# Patient Record
Sex: Male | Born: 1940 | Race: White | Hispanic: No | Marital: Married | Smoking: Former smoker
Health system: Southern US, Community
[De-identification: ages and names within clinical notes are randomized; demographics above are authoritative.]

## PROBLEM LIST (undated history)

## (undated) DIAGNOSIS — M1990 Unspecified osteoarthritis, unspecified site: Secondary | ICD-10-CM

## (undated) DIAGNOSIS — I4891 Unspecified atrial fibrillation: Secondary | ICD-10-CM

## (undated) DIAGNOSIS — K449 Diaphragmatic hernia without obstruction or gangrene: Secondary | ICD-10-CM

## (undated) DIAGNOSIS — G4733 Obstructive sleep apnea (adult) (pediatric): Secondary | ICD-10-CM

## (undated) DIAGNOSIS — I209 Angina pectoris, unspecified: Secondary | ICD-10-CM

## (undated) DIAGNOSIS — G4730 Sleep apnea, unspecified: Secondary | ICD-10-CM

## (undated) DIAGNOSIS — N189 Chronic kidney disease, unspecified: Secondary | ICD-10-CM

## (undated) DIAGNOSIS — K5792 Diverticulitis of intestine, part unspecified, without perforation or abscess without bleeding: Secondary | ICD-10-CM

## (undated) DIAGNOSIS — C61 Malignant neoplasm of prostate: Secondary | ICD-10-CM

## (undated) DIAGNOSIS — I10 Essential (primary) hypertension: Secondary | ICD-10-CM

## (undated) DIAGNOSIS — J189 Pneumonia, unspecified organism: Secondary | ICD-10-CM

## (undated) DIAGNOSIS — K219 Gastro-esophageal reflux disease without esophagitis: Secondary | ICD-10-CM

## (undated) DIAGNOSIS — C801 Malignant (primary) neoplasm, unspecified: Secondary | ICD-10-CM

## (undated) DIAGNOSIS — K759 Inflammatory liver disease, unspecified: Secondary | ICD-10-CM

## (undated) DIAGNOSIS — I499 Cardiac arrhythmia, unspecified: Secondary | ICD-10-CM

## (undated) DIAGNOSIS — C439 Malignant melanoma of skin, unspecified: Secondary | ICD-10-CM

## (undated) DIAGNOSIS — K635 Polyp of colon: Secondary | ICD-10-CM

## (undated) DIAGNOSIS — I509 Heart failure, unspecified: Secondary | ICD-10-CM

## (undated) HISTORY — DX: Obstructive sleep apnea (adult) (pediatric): G47.33

## (undated) HISTORY — DX: Polyp of colon: K63.5

## (undated) HISTORY — PX: JOINT REPLACEMENT: SHX530

## (undated) HISTORY — PX: CARDIAC CATHETERIZATION: SHX172

## (undated) HISTORY — PX: LUNG CANCER SURGERY: SHX702

## (undated) HISTORY — DX: Malignant melanoma of skin, unspecified: C43.9

## (undated) HISTORY — DX: Malignant neoplasm of prostate: C61

## (undated) HISTORY — DX: Gastro-esophageal reflux disease without esophagitis: K21.9

## (undated) HISTORY — PX: PROSTATECTOMY: SHX69

## (undated) HISTORY — DX: Diaphragmatic hernia without obstruction or gangrene: K44.9

## (undated) HISTORY — PX: FRACTURE SURGERY: SHX138

## (undated) HISTORY — PX: TONSILLECTOMY: SUR1361

## (undated) HISTORY — DX: Diverticulitis of intestine, part unspecified, without perforation or abscess without bleeding: K57.92

---

## 1993-03-10 DIAGNOSIS — C61 Malignant neoplasm of prostate: Secondary | ICD-10-CM

## 1993-03-10 HISTORY — DX: Malignant neoplasm of prostate: C61

## 1997-08-04 ENCOUNTER — Ambulatory Visit (HOSPITAL_COMMUNITY): Admission: RE | Admit: 1997-08-04 | Discharge: 1997-08-04 | Payer: Self-pay | Admitting: Internal Medicine

## 1999-08-19 ENCOUNTER — Ambulatory Visit (HOSPITAL_COMMUNITY): Admission: RE | Admit: 1999-08-19 | Discharge: 1999-08-19 | Payer: Self-pay | Admitting: Gastroenterology

## 2000-04-30 ENCOUNTER — Encounter: Payer: Self-pay | Admitting: Family Medicine

## 2000-04-30 ENCOUNTER — Encounter: Admission: RE | Admit: 2000-04-30 | Discharge: 2000-04-30 | Payer: Self-pay | Admitting: Family Medicine

## 2002-10-05 ENCOUNTER — Encounter: Admission: RE | Admit: 2002-10-05 | Discharge: 2002-10-05 | Payer: Self-pay | Admitting: Family Medicine

## 2002-10-05 ENCOUNTER — Encounter: Payer: Self-pay | Admitting: Family Medicine

## 2002-10-07 ENCOUNTER — Encounter: Payer: Self-pay | Admitting: Family Medicine

## 2002-10-07 ENCOUNTER — Encounter: Admission: RE | Admit: 2002-10-07 | Discharge: 2002-10-07 | Payer: Self-pay | Admitting: Family Medicine

## 2004-08-01 ENCOUNTER — Ambulatory Visit (HOSPITAL_COMMUNITY): Admission: RE | Admit: 2004-08-01 | Discharge: 2004-08-01 | Payer: Self-pay | Admitting: Gastroenterology

## 2004-08-01 ENCOUNTER — Encounter (INDEPENDENT_AMBULATORY_CARE_PROVIDER_SITE_OTHER): Payer: Self-pay | Admitting: Specialist

## 2005-01-29 ENCOUNTER — Encounter: Admission: RE | Admit: 2005-01-29 | Discharge: 2005-01-29 | Payer: Self-pay | Admitting: Orthopaedic Surgery

## 2005-02-14 ENCOUNTER — Ambulatory Visit (HOSPITAL_BASED_OUTPATIENT_CLINIC_OR_DEPARTMENT_OTHER): Admission: RE | Admit: 2005-02-14 | Discharge: 2005-02-14 | Payer: Self-pay | Admitting: Orthopaedic Surgery

## 2005-02-14 ENCOUNTER — Ambulatory Visit (HOSPITAL_COMMUNITY): Admission: RE | Admit: 2005-02-14 | Discharge: 2005-02-14 | Payer: Self-pay | Admitting: Orthopaedic Surgery

## 2005-02-21 ENCOUNTER — Ambulatory Visit (HOSPITAL_COMMUNITY): Admission: RE | Admit: 2005-02-21 | Discharge: 2005-02-21 | Payer: Self-pay | Admitting: Orthopaedic Surgery

## 2006-04-30 ENCOUNTER — Ambulatory Visit: Payer: Self-pay | Admitting: Pulmonary Disease

## 2006-09-23 ENCOUNTER — Encounter (HOSPITAL_COMMUNITY): Admission: RE | Admit: 2006-09-23 | Discharge: 2006-09-28 | Payer: Self-pay

## 2006-10-20 ENCOUNTER — Ambulatory Visit (HOSPITAL_BASED_OUTPATIENT_CLINIC_OR_DEPARTMENT_OTHER): Admission: RE | Admit: 2006-10-20 | Discharge: 2006-10-20 | Payer: Self-pay | Admitting: Urology

## 2006-10-26 ENCOUNTER — Encounter: Admission: RE | Admit: 2006-10-26 | Discharge: 2006-10-26 | Payer: Self-pay | Admitting: Family Medicine

## 2006-12-15 ENCOUNTER — Inpatient Hospital Stay (HOSPITAL_COMMUNITY): Admission: RE | Admit: 2006-12-15 | Discharge: 2006-12-19 | Payer: Self-pay | Admitting: Orthopaedic Surgery

## 2007-01-05 ENCOUNTER — Encounter: Admission: RE | Admit: 2007-01-05 | Discharge: 2007-01-05 | Payer: Self-pay | Admitting: Orthopaedic Surgery

## 2008-09-29 ENCOUNTER — Inpatient Hospital Stay (HOSPITAL_COMMUNITY): Admission: EM | Admit: 2008-09-29 | Discharge: 2008-10-02 | Payer: Self-pay | Admitting: Emergency Medicine

## 2008-10-30 ENCOUNTER — Encounter: Admission: RE | Admit: 2008-10-30 | Discharge: 2008-10-30 | Payer: Self-pay | Admitting: Cardiology

## 2010-01-05 ENCOUNTER — Encounter: Admission: RE | Admit: 2010-01-05 | Discharge: 2010-01-05 | Payer: Self-pay | Admitting: Sports Medicine

## 2010-03-31 ENCOUNTER — Encounter: Payer: Self-pay | Admitting: Urology

## 2010-05-01 ENCOUNTER — Other Ambulatory Visit: Payer: Self-pay | Admitting: Dermatology

## 2010-06-16 LAB — TSH: TSH: 1.156 u[IU]/mL (ref 0.350–4.500)

## 2010-06-16 LAB — CBC
Hemoglobin: 13.6 g/dL (ref 13.0–17.0)
MCHC: 34.1 g/dL (ref 30.0–36.0)
Platelets: 166 10*3/uL (ref 150–400)
RBC: 4.37 MIL/uL (ref 4.22–5.81)
RBC: 4.78 MIL/uL (ref 4.22–5.81)
WBC: 8.1 10*3/uL (ref 4.0–10.5)

## 2010-06-16 LAB — POCT I-STAT, CHEM 8
BUN: 14 mg/dL (ref 6–23)
Calcium, Ion: 1.16 mmol/L (ref 1.12–1.32)
Chloride: 105 mEq/L (ref 96–112)
Creatinine, Ser: 1.1 mg/dL (ref 0.4–1.5)
Sodium: 141 mEq/L (ref 135–145)

## 2010-06-16 LAB — COMPREHENSIVE METABOLIC PANEL
AST: 21 U/L (ref 0–37)
Albumin: 4 g/dL (ref 3.5–5.2)
Calcium: 9.1 mg/dL (ref 8.4–10.5)
Creatinine, Ser: 1.06 mg/dL (ref 0.4–1.5)
GFR calc Af Amer: >60 mL/min (ref >60)
Total Protein: 7 g/dL (ref 6.0–8.3)

## 2010-06-16 LAB — POCT CARDIAC MARKERS
CKMB, poc: <1 ng/mL — ABNORMAL LOW (ref 1.0–8.0)
Myoglobin, poc: 134 ng/mL (ref 12–200)
Myoglobin, poc: 149 ng/mL (ref 12–200)
Troponin i, poc: <0.05 ng/mL (ref 0.00–0.09)

## 2010-06-16 LAB — D-DIMER, QUANTITATIVE: D-Dimer, Quant: 0.36 ug/mL-FEU (ref 0.00–0.48)

## 2010-06-16 LAB — CARDIAC PANEL(CRET KIN+CKTOT+MB+TROPI)
Relative Index: INVALID (ref 0.0–2.5)
Total CK: 57 U/L (ref 7–232)
Total CK: 60 U/L (ref 7–232)
Troponin I: 0.01 ng/mL (ref 0.00–0.06)

## 2010-06-16 LAB — APTT: aPTT: 30 seconds (ref 24–37)

## 2010-06-16 LAB — CK TOTAL AND CKMB (NOT AT ARMC)
CK, MB: 1.3 ng/mL (ref 0.3–4.0)
CK, MB: 1.5 ng/mL (ref 0.3–4.0)
Relative Index: INVALID (ref 0.0–2.5)

## 2010-06-16 LAB — TROPONIN I
Troponin I: 0.02 ng/mL (ref 0.00–0.06)
Troponin I: <0.01 ng/mL (ref 0.00–0.06)

## 2010-07-23 NOTE — Op Note (Signed)
[name] [name], [name] [name]             ACCOUNT NO.:  [id number]   MEDICAL RECORD NO.:  [id number]          PATIENT TYPE:  AMB   LOCATION:  [facility]                         FACILITY:  [facility]   PHYSICIAN:  Jamison Neighbor, M.D.  DATE OF BIRTH:  01/06/1941   DATE OF PROCEDURE:  10/20/2006  DATE OF DISCHARGE:                               OPERATIVE REPORT   SERVICE:  Urology.   PREOPERATIVE DIAGNOSIS:  Phimosis.   POSTOPERATIVE DIAGNOSIS:  Phimosis.   PROCEDURE:  Circumcision.   SURGEON:  Dr. Marcelyn Bruins.   ANESTHESIA:  General.   COMPLICATIONS:  None.   DRAINS:  None.   BRIEF HISTORY:  This 70 year old male is status post radical  prostatectomy several years ago.  The patient's PSA has recently  increased slightly to 0.24. He has had a Prostascint scan  ordered in  order to evaluate that problem.  The patient has developed some problems  with phimosis and has requested a circumcision.  He understands the  risks and benefits of the procedure and gave full informed consent.   PROCEDURE:  After successful induction of general anesthesia, the  patient was placed in the dorsal lithotomy position, prepped with  Betadine and draped in the usual sterile fashion.  A circumferential  nerve block was placed around the base of the penis.  A circumferential  incision was marked at approximately 8 mm below the glans penis and also  on the outer penile skin at the proximal level of glans. These two  circumferential incisions were made. The resultant skin bridge was  undermined and then clamped with a straight clamp, electrocautery was  used to excise this area and to remove the entire specimen.  Hemostasis  was obtained with electrocautery.  Quadrant sutures of  4-0 chromic were  placed in all four with a box-type suture placed at the frenulum.  Additional interrupted sutures were used to complete the anastomosis.  Final inspection showed a normal glans penis.  The patient had adequate  residual  skin for coverage with erection but all the redundant tissue  had been successfully removed. The specimen was  unremarkable and will be discarded.  There was no reason for pathologic  analysis.  The patient had a dressing of Xeroform gauze, dry gauze and  Coban which he will remove in 1 day.  He will be sent home with a  prescription of Lorcet for pain management and return to the office in 2  weeks for follow-up.      Jamison Neighbor, M.D.  Electronically Signed     RJE/MEDQ  D:  10/20/2006  T:  10/21/2006  Job:  [id number]

## 2010-07-23 NOTE — Cardiovascular Report (Signed)
[name] [name], [name] [name]             ACCOUNT NO.:  [id number]   MEDICAL RECORD NO.:  [id number]          PATIENT TYPE:  INP   LOCATION:  4731                         FACILITY:  [facility]   PHYSICIAN:  Armanda Magic, M.D.     DATE OF BIRTH:  1940/05/20   DATE OF PROCEDURE:  10/02/2008  DATE OF DISCHARGE:  10/02/2008                            CARDIAC CATHETERIZATION   REFERRING PHYSICIAN:  Bryan Lemma. Ehinger, MD   PROCEDURES:  1. Left heart catheterization.  2. Coronary angiography.  3. Left ventriculography.   OPERATOR:  Armanda Magic, MD   INDICATIONS:  Chest pain.   COMPLICATIONS:  None.   IV ACCESS:  Via right femoral artery, 5-French sheath.   IV MEDICATIONS:  1. Versed 1 mg.  2. Fentanyl 25 mcg.   This is a 70 year old male who has a history of hypertension and GERD as  well as asthma who presented with episodes of chest pain while asleep.  He ruled out for myocardial infarction and now presents for cardiac  catheterization.   The patient was brought to the cardiac catheterization laboratory in the  fasting nonsedated state.  Informed consent was obtained.  The patient  was connected to continuous heart rate, pulse oximetry monitoring, and  intermittent blood pressure monitoring.  The right groin was prepped and  draped in a sterile fashion.  Xylocaine 1% was used for local  anesthesia.  Using the modified Seldinger technique, a 5-French sheath  was placed in the right femoral artery.  Under fluoroscopic guidance, a  5-French JL4 catheter was placed in the left coronary artery.  Multiple  cine films were taken at 30-degree RAO and 40-degree LAO views.  This  catheter was then exchanged out over a guidewire for a 5-French JR4  catheter, which successfully engaged the right coronary ostium.  Multiple cine films were taken at 30-degree RAO and 40-degree LAO views.  This catheter was then exchanged out over a guidewire for 5-French  angled pigtail catheter, which was placed  across the aortic valve.  Left  ventriculography was performed in a 30-degree RAO view using total of 30  mL of contrast at 15 mL per second.  The catheter was then pulled back  across the aortic valve with no significant gradient noted.  At the end  of procedure, all catheters and sheaths were removed.  Manual  compression was performed until adequate hemostasis was obtained.  The  patient was transferred back to room in stable condition.   RESULTS:  The left main coronary artery is widely patent and bifurcates  into left anterior descending artery and left circumflex artery.  The  left anterior descending artery is widely patent throughout its course  of the apex.  It gives rise to a moderate-sized diagonal-1 branch, which  is widely patent.  A second diagonal-2 branch again moderate in size  which is widely patent and a third diagonal-3 branch, which is large and  widely patent and bifurcates into two daughter branches, both of which  are widely patent.  The ongoing LAD traverses to the apex and is patent.   The left circumflex is  widely patent throughout its course in the AV  groove giving rise to a first obtuse marginal branch and then terminates  in a second obtuse marginal branch, both of which are widely patent.   Right coronary artery is widely patent throughout its course and  distally bifurcates into posterior descending artery and posterior  lateral artery, both of which are widely patent.   Left ventriculography shows normal LV function, EF of 60%.  LV pressure  127/1 mmHg, aortic pressure 135/66 mmHg.   ASSESSMENT:  1. Normal coronary arteries.  2. Normal left ventricular function.  3. Noncardiac chest pain.   PLAN:  Discharge home after IV fluid and bedrest are complete.  He will  have a groin check in 2 weeks with my nurse practitioner.  He will  follow up with his primary physician for further workup of noncardiac  chest pain.  We will check a D-dimer before he  leaves today.      Armanda Magic, M.D.  Electronically Signed     TT/MEDQ  D:  10/02/2008  T:  10/03/2008  Job:  [id number]   cc:   Bryan Lemma. Manus Gunning, M.D.

## 2010-07-23 NOTE — Discharge Summary (Signed)
[name], [name]             ACCOUNT NO.:  [id number]   MEDICAL RECORD NO.:  [id number]          PATIENT TYPE:  INP   LOCATION:  4731                         FACILITY:  [facility]   PHYSICIAN:  Armanda Magic, M.D.     DATE OF BIRTH:  08-16-1940   DATE OF ADMISSION:  09/29/2008  DATE OF DISCHARGE:  10/02/2008                               DISCHARGE SUMMARY   ADMISSION DIAGNOSES:  1. Chest pain.  2. Hypertension.  3. Gastroesophageal reflux disease.  4. Prostate carcinoma.  5. Melanoma.  6. Diverticulosis.  7. Asthma.   DISCHARGE DIAGNOSES:  1. Noncardiac chest pain with normal coronary arteries by cardiac      catheterization.  2. Hypertension.  3. Gastroesophageal reflux disease.  4. Prostate carcinoma.  5. Melanoma.  6. Diverticulosis.  7. Asthma.   PROCEDURES:  On October 02, 2008, the patient was taken to the cardiac  catheterization laboratory and underwent cardiac catheterization  revealing normal coronary arteries and normal LV function.   COMPLICATIONS:  None.   CONSULTANTS:  None.   HISTORY OF PRESENT ILLNESS:  This is a 70 year old male with no prior  cardiac history who was admitted with chest pain developing 3 days prior  to admission while asleep, described as a substernal chest pain  radiating to left shoulder and down the left arm associated with nausea,  diaphoresis, and shortness of breath.  It lasted 6-7 hours before  resolving spontaneously.  Since then, he has had several additional  spells of spontaneous nonexertional, but not a severe chest pain.  He  presented to Dr. Randel Books office, initially where cardiac enzymes were  negative and then was admitted through [facility] on September 29, 2008.   HOSPITAL COURSE:  Mr. Seda ruled out for myocardial infarction by  serial cardiac enzymes.  He was placed on Lovenox during his hospital  stay and on October 02, 2008, he underwent cardiac catheterization which  revealed normal coronary arteries,  normal LV function with no gradient  across aortic valve.  It was felt that his pain was noncardiac in  etiology.  A D-dimer was ordered prior to his discharge and was pending  at the time of this dictation.   DISCHARGE DIET:  He is to resume a prudent diet.   MEDICATIONS ON DISCHARGE:  1. Omeprazole 20 mg daily.  2. Azor 5/20 mg daily.  3. Singulair 10 mg daily.  4. He is also to continue calcium, vitamin D, fish oil, and      multivitamin.   ACTIVITY:  Once he has completed his bedrest and discharged, he is to be  out of bed and walking with activity increasing slowly.  He has been  advised not to lift for 1 week anything greater than 10 pounds and no  driving for 24 hours.   FOLLOWUP:  He is to follow up in Dr. Norris Cross office with nurse  practitioner Evern Bio for groin check on October 23, 2008, at 10:10  a.m.   LABORATORY DATA DURING HOSPITAL STAY:  Cardiac enzymes were negative x3.  CBC, white blood cell count 6.7, hemoglobin 13.6,  hematocrit 39.7,  platelet count 162.  TSH 1.56, INR 1.1.  Sodium 141, potassium 4,  chloride 107, bicarb 27, BUN 12, creatinine 1.06, glucose 100, alkaline  phosphatase 59, AST 21, ALT 16, total protein 7, albumin 4, and calcium  9.1.  Chest x-ray showed no active disease.      Armanda Magic, M.D.  Electronically Signed     TT/MEDQ  D:  10/02/2008  T:  10/02/2008  Job:  [id number]   cc:   Bryan Lemma. Manus Gunning, M.D.

## 2010-07-23 NOTE — Op Note (Signed)
[name] [name], [name] [name]             ACCOUNT NO.:  [id number]   MEDICAL RECORD NO.:  [id number]          PATIENT TYPE:  INP   LOCATION:  2550                         FACILITY:  [facility]   PHYSICIAN:  Claude Manges. Whitfield, M.D.DATE OF BIRTH:  November 02, 1940   DATE OF PROCEDURE:  12/15/2006  DATE OF DISCHARGE:                               OPERATIVE REPORT   PREOPERATIVE DIAGNOSIS:  End-stage osteoarthritis, left knee.   POSTOPERATIVE DIAGNOSIS:  End-stage osteoarthritis, left knee.   PROCEDURE:  Left total knee arthroplasties.   SURGEON:  Claude Manges. Cleophas Dunker, M.D.   ASSISTANT:  Rexene Edison, [facility]   ANESTHESIA:  General orotracheal with supplemental femoral nerve block.   COMPLICATIONS:  None.   COMPONENTS:  DePuy LCS large femoral component #5 rotating keeled tibial  tray with a 10 mm bridging bearing, a metal backed three pegged rotating  patella.  All was secured polymethyl methacrylate.   PROCEDURE:  The patient comfortable on the operating table and under  general orotracheal anesthesia with a supplemental femoral nerve block,  nursing staff inserted a Foley catheter.  The left lower extremity was  then placed in a thigh tourniquet.  The left lower extremity was then  prepped with Betadine scrub and DuraPrep from the tourniquet to the  midfoot.  Sterile draping was performed.   With the extremity still elevated it was Esmarch exsanguinated with the  proximal tourniquet at 350 mmHg.   A midline longitudinal incision was made centered over the patella  extending from the superior pouch to the tibial tubercle.  Via sharp  dissection incision was carried down to subcutaneous tissue.  First  layer of capsule was incised.  In the midline there was a prepatellar  bursa which was resected.  A medial parapatellar incision was made  through the deep capsule with the Bovie.  There was a clear yellow joint  effusion.   Patella was everted 180 degrees, knee flexed to 90 degrees.  There were  large osteophytes along the medial and lateral femoral condyle and a  moderate amount of beefy red synovitis.  Synovectomy was performed.  Osteophytes were removed.  I templated a large femoral component  preoperatively.  This was confirmed intraoperatively.   First cut was made transversely in the proximal tibia using the external  guide.  Subsequent cuts were then made on the femur using the femoral  guides and a 4 degree distal femoral valgus cut.  The ACL and PCL were  sacrificed as were medial and lateral menisci.  Lamina spreader was then  inserted into each compartment to resect the medial lateral menisci and  any remnants of ACL and PCL.  Osteophytes removed from the posterior  femoral condyle with a curved three-quarter inch osteotome.  MCL and LCL  remained intact.  Finishing cut was then made on the femur for the  oblique cuts posteriorly and anteriorly.   Retractor was then placed about the tibia.  We measured a #5 rotating  keeled component.  Center cut was made followed by the keeled cut.  The  trial rotating tibial tray with was then inserted followed by the 10  mm  bridging bearing.  Flexion/extension gaps at 10 mm were symmetrical  throughout the procedure.  The femoral component was then applied  through a full range of motion.  There was no opening with either varus  or valgus stress, negative anterior drawer sign and no malrotation of  the tibial tray.   The patella was then prepared by removing 12 mm of bone thickness  leaving 13 mm of patella.  The patella jig was applied to make the three  holes.  The trial patella was applied and again through full range of  motion with all components in place, there was no subluxation of  patella.   Trial components removed joint was copiously irrigated with jet saline.  Each of the final components were then applied with polymethyl  methacrylate.  Initial component inserted was the tibial tray.  Bone was  impacted into the  center hole and the tibial tray was then impacted.  Extraneous methacrylate was removed from around the periphery.  The 10  mm bridging bearing was inserted.  The femoral component was then  impacted with polymethyl methacrylate the knee in extension.  Extraneous  methacrylate was removed from about the femur.  The patella was applied  with methacrylate and the patellar clamp.  After complete maturation,  the joint was explored, any extraneous methacrylate was removed with a  osteotome.  Bleeding bone was hemostasis with bone wax.  Tourniquet was  deflated.  There is immediate capillary refill and bleeding to the  operative site.  We had a nice dry feel after Bovie coagulation.  It did  not feel like a Hemovac was necessary.   The joint was again irrigated with saline solution.  The capsule was  closed with interrupted #1 Ethibond.  Superficial capsule closed with 0  Ethibond.  Superficial capsule with 2-0 Vicryl, the superficial tissue  with 2-0 Vicryl, skin closed with skin clips.  Sterile bulky dressing  was applied.   The patient tolerated procedure without complications.      Claude Manges. Cleophas Dunker, M.D.  Electronically Signed     PWW/MEDQ  D:  12/15/2006  T:  12/15/2006  Job:  [id number]

## 2010-07-23 NOTE — H&P (Signed)
[name] [name], [name] [name]             ACCOUNT NO.:  [id number]   MEDICAL RECORD NO.:  [id number]          PATIENT TYPE:  INP   LOCATION:  4731                         FACILITY:  [facility]   PHYSICIAN:  Francisca December, M.D.  DATE OF BIRTH:  1940/07/11   DATE OF ADMISSION:  09/29/2008  DATE OF DISCHARGE:                              HISTORY & PHYSICAL   REASON FOR ADMISSION:  Chest pain.   HISTORY OF PRESENT ILLNESS:  Mr. Donatello Kleve is a pleasant 70-year-  old male without a prior cardiac history who developed 3 days prior to  admission the onset while asleep of anterior substernal chest pain that  radiated into the left shoulder and down the left arm.  It was  associated with nausea, diaphoresis, and some shortness of breath.  The  pain persisted for approximately 6-7 hours before resolving  spontaneously.  Since that time, he has had several additional spells  all of which were spontaneous and nonexertional but not as severe as the  initial episode.  He sought care at Dr. Randel Books office today who send  him to [facility] Emergency Room.  Here, he has been shown as in Dr. Randel Books  office to have a normal EKG and initial CK-MB, troponin by point-of-care  enzymes have been normal.  He is currently being treated with topical  nitroglycerin and IV heparin and is pain free.   PAST MEDICAL HISTORY:  1. Hypertension.  2. GERD.  3. Prostate carcinoma.  4. Melanoma.  5. Diverticulosis.  6. Asthma.   MEDICATIONS AS AN OUTPATIENT:  1. Omeprazole 20 mg p.o. daily.  2. Azor 5/20 one p.o. daily.  3. Singulair 10 mg once a day.  4. He also takes calcium, vitamin D, fish oil, and multivitamins.   DRUG ALLERGIES:  AMINOPHYLLINE, LISINOPRIL which causes a cough, and  TENORMIN which apparently just did not work for his blood pressure.   FAMILY HISTORY:  Mother had breast carcinoma, otherwise, unremarkable.  No significant history of early coronary artery disease.   SOCIAL HISTORY:  He quit  smoking in 1985.  No alcohol.  No recreational  drug use.  He is a retired Architect.  He is married,  accompanied by his wife in the emergency room today for 53 years.  She  has 3 adult children and 6 grandchildren.   REVIEW OF SYSTEMS:  He denies any cough or hemoptysis.  He does have  some headache now associated with his nitroglycerin.  No visual changes.  No difficulty swallowing.  Does not wheeze.  He has not had any tachy  palpitation, lightheadedness, syncope, or near syncope.  He has no  abdominal pain, diarrhea.  He is somewhat chronically constipated.  No  hematochezia or melena.  He has difficulty holding his urine and is up  frequently at night to urinate.  He does not have any dysuria.  He has  had left knee replacement and osteoarthritic changes in both knees.  He  also has a mechanical low back pain of arthritic nature.  Denies any  numbness or tingling.  No muscle weakness.  No difficulty to  speech.   PHYSICAL EXAMINATION:  VITAL SIGNS:  The blood pressure is 119/55, pulse  is 61 and regular, respiratory rate 20, temperature 98.1.  GENERAL:  This is a well-appearing, mildly obese 70 year old man in no  distress.  HEENT:  Unremarkable.  Head is atraumatic and normocephalic.  The pupils  are equal, reactive to light.  Extraocular movements are intact.  Sclerae are anicteric.  Oral mucosa is pink and moist.  Teeth and gums  are in good repair.  Tongue is not coated.  NECK:  Supple without thyromegaly or masses.  The carotid upstrokes are  normal.  There is no bruit.  There is no JVD.  CHEST:  Clear with adequate excursion.  No wheezes, rales, or rhonchi.  HEART:  Regular rhythm.  Normal S1 and S2 is heard.  No S3, S4, murmur,  click, or rub noted.  ABDOMEN:  Soft, flat, nontender.  No midline pulsatile mass.  Bowel  sounds present in all quadrants.  EXTERNAL GENITALIA:  Normal with normal male phallus, descended  testicles.  No lesions.  RECTAL:  Not  performed.  EXTREMITIES:  Full range of motion.  No edema.  Intact distal pulses.  There is a TKR scar on the left.  NEUROLOGICAL:  Cranial nerves II through XII are intact.  Motor and  sensory are grossly intact.  Gait not tested.  SKIN:  Warm, dry, and clear.   ACCESSORY CLINICAL DATA:  As mentioned, initial CK-MB, troponin by point-  of-care enzymes negative x2.  Admission hemogram is normal, serum  electrolytes are normal.  Creatinine 1.06, BUN 12.  PT/PTT are normal.   Electrocardiogram, normal sinus rhythm.  Normal EKG.   Chest x-ray, no active cardiopulmonary disease.   ASSESSMENT:  1. Unstable angina pectoris.  2. Essential hypertension.  3. Hyperlipidemia.  4. History of malignant neoplasm of the prostate.  5. Esophageal reflux.  6. History of cough, on lisinopril.   PLAN:  1. I am admitting the patient for treatment with subcutaneous Lovenox      and IV nitroglycerin.  He has been administered aspirin four 81 mg      tablets at this time.  2. I have advised him of the serious nature of this illness and that      he is essentially demonstrating pre-infarction angina.  It is      important that we gain control over the situation and that he will      likely require cardiac catheterization.  Goals, risks, and      alternatives were discussed briefly  3. We will plan for his catheterization on October 02, 2008, by Dr. Armanda Magic but any recurrent symptoms not well controlled between now      and then will prompt early intervention.  Also, we will repeat his      CK-MB and troponin through the laboratory.      Francisca December, M.D.  Electronically Signed     JHE/MEDQ  D:  09/29/2008  T:  09/30/2008  Job:  [id number]   cc:   Bryan Lemma. Manus Gunning, M.D.

## 2010-07-26 NOTE — Op Note (Signed)
[place]  Patient:    [name] [name], [name] [name]                    MRN: [id number] Proc. Date: 08/19/99 Adm. Date:  [id number] Disc. Date: [id number] Attending:  Nelda Marseille CC:         Dyanne Carrel, M.D.                           Operative Report  PROCEDURE:  Colonoscopy.  INDICATIONS:  The patient with bright red blood per rectum, due for colonic screening.  Consent was signed after risks, benefits, methods, and options were  thoroughly discussed multiple times in the past.  MEDICATIONS:  Demerol 80 mg, Versed 8 mg.  DESCRIPTION OF PROCEDURE:  Rectal inspection was pertinent for external hemorrhoids.  Digital examination was negative.  Video colonoscope was inserted, easily advanced around the colon to the cecum.  The cecum was identified by the  appendiceal orifice and the ileocecal valve.  The prep was only fair and was worse on the right side than the left.  Lots of washing and suctioning was tried to be done on the right side and we did even roll him on his back just to change the dependent portions of the colon in an effort to better see the remaining parts f the colon that were covered with stool.  The scope was slowly withdrawn.  Lots f washing and suctioning was done as we slowly withdrew back to the rectum.  The rep on the left side was a little better than the right, but was only fair overall. We could not wash and suction all of the stool off the wall of the mucosa, but on low withdrawal through the colon, no polyps or masses were seen, nor any signs of bleeding.  There was some left-sided moderate amount of diverticuli, but no other abnormalities.  Once back in the rectum, the scope was retroflexed pertinent for some internal hemorrhoids.  The scope was straightened, air was withdrawn, and he scope removed.  The patient tolerated the procedure well.  There was no evidence of immediate  complications.  ENDOSCOPIC DIAGNOSES: 1. Internal and external hemorrhoids. 2. Left-sided diverticuli. 3. Otherwise within normal limits to the cecum with the prep a little better    on the left side than the right.  PLAN:  Yearly rectals and guaiacs per Dyanne Carrel, M.D.  GI follow-up p.r.n. or in six months.  In the future when colonic screening is needed, would use GoLytely prep or an extra bottle of Fleets or magnesium citrate.  Have him call me sooner p.r.n. DD:  08/19/99 TD:  08/21/99 Job: [id number]

## 2010-07-26 NOTE — Op Note (Signed)
NAME:  [name] [name], [name] [name]             ACCOUNT NO.:  [id number]   MEDICAL RECORD NO.:  [id number]          PATIENT TYPE:  AMB   LOCATION:  DSC                          FACILITY:  [facility]   PHYSICIAN:  Claude Manges. Whitfield, M.D.DATE OF BIRTH:  1940-09-01   DATE OF PROCEDURE:  02/14/2005  DATE OF DISCHARGE:                                 OPERATIVE REPORT   PREOPERATIVE DIAGNOSES:  1.  Degenerative joint disease, medial compartment, left knee.  2.  Probable tear of medial meniscus.   POSTOPERATIVE DIAGNOSES:  1.  Degenerative joint disease, medial compartment, left knee.  2.  Tear of medial meniscus.   PROCEDURES:  1.  Diagnostic arthroscopy, left knee.  2.  Arthroscopic partial medial meniscectomy.  3.  Microfracture of medial tibial plateau.   SURGEON:  Claude Manges. Cleophas Dunker, M.D.   ANESTHESIA:  IV sedation and local 1% Xylocaine with epinephrine.   COMPLICATIONS:  None.   HISTORY:  A 70 year old gentleman, has had trouble off and on with his left  knee for several years.  He has what appears to be at least some medial  compartment arthritis by plain x-ray, but he did have a recent MRI scan that  revealed medial compartment degenerative changes with possible tear of the  medial meniscus.  He continues to have problems to the point of compromise  despite time, injections and anti-inflammatory medicine and wishes to  proceed with an arthroscopic evaluation.   PROCEDURE:  With the patient comfortable on the operating table under IV  sedation, the left lower extremity was placed in a thigh holder.  The leg  was then prepped with DuraPrep from the thigh holder to the ankle.  Sterile  draping was performed.   Diagnostic arthroscopy was performed using a medial and lateral parapatellar  tendon stab wound.  Diagnostic arthroscopy was performed with the scope  placed in the lateral stab site.  There was a small clear yellow joint  effusion.  Diagnostic arthroscopy revealed mild  chondromalacia diffusely of  the patella.  There was probably diffuse thinning of the articular cartilage  but no deep furrowing.  There were no loose bodies.   The ACL appeared to be intact.   The lateral compartment appeared to be relatively clear of chondromalacia,  although there may have been some articular cartilage thinning.  The lateral  meniscus was intact except for some very superficial tearing of the leading  edge.   The medial compartment revealed increased varus.  There were areas of  exposed subchondral bone beneath the meniscus in the far medial portion, and  there was an old partial medial meniscectomy involving what appeared to be  the posterior third.  There was recurrent tearing and, accordingly, a  combination of basket forceps, the ArthroCare wand and the shaver were used  to further stabilize the meniscal tear.  I also performed a microfracture to  the areas of exposed bone along the medial portion of the tibial plateau and  then debrided this to be sure there were no loose pieces of bone.   The joint was then inspected with without evidence of loose  material, and  the two puncture sites were left open and infiltrated with 0.25% Marcaine  with epinephrine.  A sterile bulky dressing was applied, followed by an Ace  bandage.   Will plan Percocet for pain, office one week.      Claude Manges. Cleophas Dunker, M.D.  Electronically Signed     PWW/MEDQ  D:  02/14/2005  T:  02/14/2005  Job:  [id number]

## 2010-07-26 NOTE — Assessment & Plan Note (Signed)
[facility]                             PULMONARY OFFICE NOTE   NAME:[name] [name], [name] [name]                    MRN:          [id number]  DATE:04/30/2006                            DOB:          01/01/1941    REFERRING PHYSICIAN:  Dr. Jeanmarie Hubert   HISTORY OF PRESENT ILLNESS:  The patient is a 70 year old male, whom I  have been asked to see for persistent cough.  The patient states the  cough began whenever he was started on Quinopril for his hypertension.  This was quickly discontinued by Dr. Manus Gunning, and he feels that his  cough has improved, but it has not totally gone away.  The patient  states that the cough is dry in nature and occurs most often upon lying  down and before he goes to sleep.  The patient describes a tickling in  my throat, and does admit to frequent throat clearing.  He does have  some postnasal drip and feels that his throat is dry and scratchy in the  mornings whenever he awakens.  The patient does have a history of GERD  but denies any breakthrough symptoms unless he takes dairy products  before bedtime.  If he does this, he has severe GERD with regurgitation.  He also describes upper airway wheezing that sounds classic for upper  airway pseudowheezing.   PAST MEDICAL HISTORY:  1. Asthma.  2. History of prostate cancer with surgery years ago.  3. History of allergic rhinitis.   CURRENT MEDICATIONS:  1. Singulair 10 mg daily.  2. Azor 5/20, 1 daily.  3. Prilosec 20 mg daily.   THE PATIENT BELIEVES THAT HE HAS AN ALLERGY TO SOME TYPE OF ANTIBIOTIC  BUT DOES NOT KNOW THE NAME.   SOCIAL HISTORY:  He is married and has children.  He has retired from  [employer] work, has a history of smoking up to 4 packs-per-day for 30 years  but has not smoked since 1982.   FAMILY HISTORY:  Remarkable for mother dying with breast cancer,  otherwise is noncontributory.   REVIEW OF SYSTEMS:  As per history of present illness.  Also see patient  intake form documented in chart.   PHYSICAL EXAMINATION:  GENERAL:  He is an overweight male in no acute  distress.  VITAL SIGNS:  Blood pressure 116/70, pulse 65, temperature is 98.  Weight is 244 pounds.  Saturation on room air is 95%.  HEENT:  Pupils equal, round, and reactive to light and accommodation.  Extraocular muscles are intact.  Nares are patent without discharge.  Oropharynx is clear.  NECK:  Supple without JVD or lymphadenopathy.  There is no palpable  thyromegaly.  CHEST:  Totally clear to auscultation.  CARDIAC:  Regular rate and rhythm.  No murmurs, rubs, or gallops.  ABDOMEN:  Soft, nontender with good bowel sounds.  GENITAL/RECTAL/BREAST EXAM:  Not done and not indicated.  LOWER EXTREMITIES:  Trace to 1+ edema, right greater than left.  NEUROLOGIC:  He is alert and oriented with no obvious motor deficits.   LABORATORY DATA:  Spirometry was done today in the office  and is totally  within normal limits.   IMPRESSION:  Cough, probably secondary to allergic rhinitis with  postnasal drip, but there also may be a component of breakthrough  laryngopharyngeal reflux.  I doubt this is related to his asthma since  he has normal spirometry, nor do I think it is related to another lung  process.  I think we ought to give him a trial of more aggressive  treatment of laryngopharyngeal reflux and also something for allergic  rhinitis and see if it improves.  Also, would like to check a chest x-  ray to be thorough.  The patient is agreeable to this approach.   PLAN:  1. Check chest x-ray today.  2. Zyzal 1 p.o. daily as a trial.  3. Increase Prilosec to b.i.d. dosing.   The patient will follow up in 4 weeks to assess his problems.     Barbaraann Share, MD,FCCP  Electronically Signed    [facility]/MedQ  DD: 05/14/2006  DT: 05/14/2006  Job #: [id number]   cc:   Bryan Lemma. Manus Gunning, M.D.

## 2010-07-26 NOTE — Op Note (Signed)
NAMEKAMONTE, MCMICHEN             ACCOUNT NO.:  [id number]   MEDICAL RECORD NO.:  [id number]          PATIENT TYPE:  OUT   LOCATION:  DFTL                         FACILITY:  [facility]   PHYSICIAN:  Claude Manges. Whitfield, M.D.DATE OF BIRTH:  Aug 27, 1940   DATE OF PROCEDURE:  02/14/2005  DATE OF DISCHARGE:  02/14/2005                                 OPERATIVE REPORT   NOTE:  This is from memory.   PREOPERATIVE DIAGNOSES:  1.  Degenerative joint disease, medial compartment, left knee.  2.  Tear of medial meniscus.   POSTOPERATIVE DIAGNOSES:  1.  Degenerative joint disease, medial compartment, left knee.  2.  Tear of medial meniscus.   PROCEDURE:  1.  Arthroscopic partial medial meniscectomy, left knee.  2.  Arthroscopic debridement, medial compartment, and microfracture      technique of medial tibial plateau.   SURGEON:  Claude Manges. Cleophas Dunker, M.D.   ANESTHESIA:  Local 1% Xylocaine with epinephrine and IV sedation.   COMPLICATIONS:  None.   HISTORY:  [age]-year-old gentleman has been followed over a period of  months for problems referable to his left knee.  He has had pain over the  medial compartment with evidence of degenerative joint disease, but he did  have an MRI scan consistent with a tear of the medial meniscus, in addition  to the arthritic change.  He has given it time, anti-inflammatory medicines  and wishes to proceed with the arthroscopy.   PROCEDURE:  With the patient comfortable on the operating table and under  minimal IV sedation, the left lower extremity was placed in a thigh holder  and the leg was then prepped with DuraPrep and the thigh holder to the  ankle.  Sterile draping was performed.  Prior to the operating room, his  knee was injected with Xylocaine with epinephrine per the anesthesiologist.   Diagnostic arthroscopy was performed using a medial and lateral parapatellar  tendon stab wound.  There was a small effusion.   Diagnostic arthroscopy did  not reveal any loose bodies.  There was some  synovitis in the superior pouch, but I do not remember the extent.  The  patella did not track abnormally.   The gutters revealed some synovitis.   The lateral compartment was relatively clear of  chondromalacia.  The  lateral meniscus revealed some fraying along the edge, but there was no  significant tears and did not require debridement.  The ACL appeared to be  intact.   The medial compartment did indeed reveal some degenerative change with loss  of articular cartilage, particularly in the medial tibial plateau in the  area of a medial meniscal tear posteriorly.  Using a series of basket  forceps and intra-articular shaver, the torn medial meniscus was debrided  back to stable meniscal rim.  There were areas of exposed subchondral bone  in the medial tibial plateau which were debrided and then a microfracture  technique was performed in hopes of stimulating articular cartilage.  The  joint was then explored for any evidence of loose material.  The 2 stab  wounds were left open and infiltrated  with 0.25% Marcaine with epinephrine.  A sterile bulky dressing was applied followed by an Ace bandage.   PLAN:  Percocet for pain, crutches, office in 1 week.      Claude Manges. Cleophas Dunker, M.D.  Electronically Signed     PWW/MEDQ  D:  04/13/2005  T:  04/14/2005  Job:  [id number]

## 2010-07-26 NOTE — Op Note (Signed)
NAME:  [name] [name], [name] [name]             ACCOUNT NO.:  [id number]   MEDICAL RECORD NO.:  [id number]          PATIENT TYPE:  AMB   LOCATION:  ENDO                         FACILITY:  [facility]   PHYSICIAN:  Petra Kuba, M.D.    DATE OF BIRTH:  10-12-1940   DATE OF PROCEDURE:  08/01/2004  DATE OF DISCHARGE:                                 OPERATIVE REPORT   PROCEDURE:  Colonoscopy.   ENDOSCOPIST:  Petra Kuba, M.D.   INDICATION:  Screening.  Consent was signed after risks, benefits, methods,  and options were thoroughly discussed in the office.   MEDICINES USED:  Demerol 70, Versed 7.   DESCRIPTION OF PROCEDURE:  Rectal inspection was pertinent for external  hemorrhoids, small.  Digital exam was negative.  The video colonoscope was  inserted and easily advanced around the colon to the cecum.  This did not  require any abdominal pressure of any position changes.  Other than left-  sided diverticula, no abnormalities were seen.  The cecum was identified by  the appendiceal orifice and the ileocecal valve.  The prep was adequate.  There was some liquid stool that required washing and suctioning.  On slow  withdrawal through the colon, the right side was normal. The cystoscope was  withdrawn around the distal transverse.  A few scattered diverticula were  seen which increased distally.  In the descending and sigmoid were two tiny,  probable hyperplastic-appearing polyps which were hot biopsied as well as  one in the rectum.  All polyps were put in the same container.  Once back in  the rectum, anorectal pull through in retroflexion confirmed some small  hemorrhoids.  The scope was re-inserted a short ways up the left side of the  colon.  Air was suctioned and the scope removed.  The patient tolerated the  procedure well.  There was no obvious immediate complication.   ENDOSCOPIC DIAGNOSES:  1.  Internal and external hemorrhoids.  2.  Left-sided diverticula.  3.  Two tiny rectosigmoid and  descending probable hyperplastic-appearing      polyps hot biopsied.  4.  Otherwise within normal limits to the cecum.   PLAN:  Await pathology.  Probably recheck colon screening in five years.  Continue workup with an esophagogastroduodenoscopy.       ___________________________________________  Petra Kuba, M.D.    MEM/MEDQ  D:  08/01/2004  T:  08/01/2004  Job:  [id number]   cc:   Bryan Lemma. Manus Gunning, M.D.  [address] [place]  [place] [address]  Fax: [phone]

## 2010-07-26 NOTE — Discharge Summary (Signed)
NAMEJAICOB, DIA             ACCOUNT NO.:  [id number]   MEDICAL RECORD NO.:  [id number]          PATIENT TYPE:  INP   LOCATION:  5022                         FACILITY:  [facility]   PHYSICIAN:  Claude Manges. Whitfield, M.D.DATE OF BIRTH:  Jan 13, 1941   DATE OF ADMISSION:  12/15/2006  DATE OF DISCHARGE:  12/19/2006                               DISCHARGE SUMMARY   ADMISSION DIAGNOSES:  1. End-stage osteoarthritis bilateral knees, left worse than right.  2. Hypertension.  3. Asthma.  4. Gastroesophageal reflux disease.  5. Hiatal hernia.  6. Diverticulosis.  7. History of melanoma left face.  8. History of prostate cancer.   DISCHARGE DIAGNOSES:  1. End-stage osteoarthritis bilateral knees, status post left total      knee arthroplasty.  2. Acute blood loss anemia secondary to surgery.  3. Diarrhea now resolved.  4. Pyrexia now resolved.  5. Hypertension.  6. Asthma.  7. Gastroesophageal reflux disease.  8. Hiatal hernia.  9. Diverticulosis.  10.History of melanoma left face.  11.History of prostate cancer.   SURGICAL PROCEDURES:  On December 15, 2006, Mr. Bufano underwent a left  total knee arthroplasty by Dr. Claude Manges.  Whitfield and assisted by Rexene Edison PA-C.  He had an LCS complete metal back patella cemented size  large placed with a DePuy NBT keel tibial tray cemented size 5, an LPS  complete primary femoral component cemented size large left and LPS  complete RP insert size large 10 mm thickness.   COMPLICATIONS:  None.   CONSULTANT:  1. Pharmacy consult for Coumadin therapy December 15, 2006.  2. Physical therapy consult December 16, 2006.  3. Occupational therapy consult and case management consult December 17, 2006.   HISTORY OF PRESENT ILLNESS:  This 70 year old white male patient  presented to Dr. Cleophas Dunker with a history of 10 years of gradual onset  progressive left knee pain.  He has no known injury to the knee but has  had a knee scope by Dr. Cleophas Dunker in  2007 on the left.  Left knee pain  is an intermittent ache to sharp sensation over the anteromedial joint  line with occasional radiation into the tibia.  It increases with  prolonged weightbearing and twisting and decreases with Aleve.  The knee  pops, catches, grinds and gives way.  He has failed conservative  treatment and x-rays show end-stage arthritic changes.  Because of this,  he is presenting for a left knee replacement.   HOSPITAL COURSE:  Mr. Trow tolerated the surgical procedure well  without immediate postoperative complications.  He was transferred to  5000 and started on Coumadin per protocol.  Postoperative day #1, he was  afebrile, vitals were stable, hemoglobin 11.9, hematocrit 35.2.  Leg was  neurovascularly intact.  He had complaints of poor pain control and some  reflux symptoms and medications were adjusted accordingly.   Postoperative day #2, he was feeling better, again afebrile, vitals  stable, hemoglobin 10, hematocrit 29.  The dressing was changed to the  left knee wound.  It was well-approximated with staples.  He tolerated  CPM 0-65 degrees.  He was weaned off oxygen and started on albuterol  nebs for shortness of breath, switched to p.o. pain meds and continued  on therapy.   He continued to make progress over the next several days.  He continued  to have some mild temperature on the next day or so with a T-max of  101.8, but that was treated with aggressive pulmonary toilet.  He  started having some complaints of diarrhea so softeners and laxatives  were held.  Clostridium difficile was sent which was negative.  The PT  and INR slowly rose and on December 19, 2006, he was ready for discharge  to home.   DISCHARGE DIET:  He is to resume his regular prehospitalization diet.   MEDICATIONS:  He may resume his preoperative meds except no Naprosyn  while on Coumadin.  His home meds at this time include:  1. Omeprazole 20 mg 2 tablets p.o. q.p.m.  2.  Singulair 10 mg p.o. q.a.m.  3. Azor 5/20 mg p.o. q.a.m.  4. Vitamin C 1000 mg p.o. q.a.m.  5. Fish oil 1200 mg one p.o. b.i.d.  6. Calcium 600 plus vitamin D one p.o. b.i.d.  7. Multivitamin 1 tablet p.o. q.a.m..  8. Again, his Naprosyn 220 mg p.r.n. is on hold.   Additional meds at this time include:  1. Coumadin 5 mg tablets.  He is to take 1 and 1/2 tablet on October      11 and 12, 2008, and INR will be checked at that time by the home      health pharmacy.  He is to be on Coumadin for 1 month.  2. OxyContin 10 mg 1 tablet p.o. q.12 h., 22 with no refill.  3. Percocet 5/325 1-2 p.o. q.4 h. p.r.n. for pain. 60 with no refill.  4. Robaxin 500 mg 1-2 tablets p.o. q.6 h. p.r.n. for spasms, 50 with      no refill.   WOUND CARE:  He is to keep the left knee incision clean and dry.  He may  shower after no drainage from the wound for 2 days.  Please see the blue  total knee discharge sheet for further wound care instructions.   ACTIVITY:  He is to be out of bed and partial weightbearing 50% or less  on the left leg with use of walker.  He is to have home health PT per  protocol.   FOLLOWUP:  He is to follow up with Dr. Cleophas Dunker in our office on Monday  December 28, 2006.  He needs to call [phone] for that appointment.   LABORATORY DATA:  Chest x-ray on December 09, 2006, showed no active  disease.   Hemoglobin and hematocrit ranged from 14.1 and 40.6 on the 1st to 9.6  and 27.9 on the 10th to 9.8 and 28.3 on the 11th.  White count and  platelets remained within normal limits.   PT and INR were 14 and 1.1 on the first and 21 and 1.8 on the 11th.   Glucose ranged from 89 on the 1st to a high of 130 on the 8th to 118 on  the 10th.   The estimated GFR on the 8th was 59.  The rest of the time it was within  normal limits.  Total bilirubin on the 1st was 1.7.  All other  laboratory studies were within normal limits.      Legrand Pitts Duffy, P.A.      Claude Manges. Cleophas Dunker,  M.D.   Electronically Signed    KED/MEDQ  D:  02/09/2007  T:  02/09/2007  Job:  [id number]   cc:   Bryan Lemma. Manus Gunning, M.D.

## 2010-07-26 NOTE — Op Note (Signed)
NAME:  [name] [name], [name] [name]             ACCOUNT NO.:  [id number]   MEDICAL RECORD NO.:  [id number]          PATIENT TYPE:  AMB   LOCATION:  ENDO                         FACILITY:  [facility]   PHYSICIAN:  Petra Kuba, M.D.    DATE OF BIRTH:  [birth year]   DATE OF PROCEDURE:  08/01/2004  DATE OF DISCHARGE:                                 OPERATIVE REPORT   PROCEDURE:  Esophagogastroduodenoscopy.   INDICATIONS FOR PROCEDURE:  Longstanding upper tract symptoms, want to rule  out Barrett's.   Consent was signed after risks, benefits, methods, and options were  thoroughly discussed in the office on multiple occasions.   ADDITIONAL MEDICINES FOR THIS PROCEDURE:  Demerol 20, Versed 2.   DESCRIPTION OF PROCEDURE:  The video endoscope was inserted by direct  vision. The esophagus was normal. He did have a moderate size hiatal hernia  with a widely patent thin fibrous ring. The scope passed easily through this  into the stomach, advanced through the antrum pertinent for minimal antritis  through a normal pylorus into a normal duodenal bulb and around the C loop  to a normal second portion of the duodenum. The scope was withdrawn back to  the bulb and a good look there ruled out ulcers in that location. The scope  was withdrawn back to the stomach and retroflexed. High in the cardia, a  hiatal hernia was confirmed. The angularis, lesser and greater curve were  normal on retroflexed visualization. Straight visualization of the stomach  did not reveal any additional findings. Air was suctioned, scope removed.  Again a good look at the esophagus ruled out any significant reflux changes  or Barrett's. The scope was withdrawn. The patient tolerated the procedure  well. There was no obvious or immediate complications.   ENDOSCOPIC DIAGNOSIS:  1.  Moderate hiatal hernia with a widely patent thin fibrous ring.  2.  Minimal antritis.  3.  Otherwise normal EGD.   PLAN:  Dilation p.r.n., continue pump  inhibitors. Followup p.r.n. or in six  months.     MEM/MEDQ  D:  08/01/2004  T:  08/01/2004  Job:  [id number]   cc:   Bryan Lemma. Manus Gunning, M.D.  [address] [place]  [place] [address]  Fax: [phone]

## 2010-12-04 ENCOUNTER — Other Ambulatory Visit: Payer: Self-pay | Admitting: Dermatology

## 2010-12-19 LAB — CBC
HCT: 35.2 — ABNORMAL LOW
Hemoglobin: 10 — ABNORMAL LOW
Hemoglobin: 11.9 — ABNORMAL LOW
Hemoglobin: 9.8 — ABNORMAL LOW
MCHC: 34
MCHC: 34.7
MCHC: 34.7
MCV: 86.8
MCV: 87.4
MCV: 89.2
Platelets: 183
RBC: 3.21 — ABNORMAL LOW
RBC: 3.24 — ABNORMAL LOW
RBC: 3.31 — ABNORMAL LOW
RBC: 3.94 — ABNORMAL LOW
RBC: 4.65
RDW: 13.4
RDW: 13.5
WBC: 6
WBC: 6.8
WBC: 7.3

## 2010-12-19 LAB — COMPREHENSIVE METABOLIC PANEL
ALT: 18
AST: 29
Albumin: 3.9
CO2: 30
Calcium: 9.2
Creatinine, Ser: 1.01
GFR calc Af Amer: >60
Sodium: 141
Total Protein: 6.7

## 2010-12-19 LAB — BASIC METABOLIC PANEL
BUN: 7
CO2: 30
CO2: 31
Calcium: 8.1 — ABNORMAL LOW
Calcium: 8.6
Chloride: 100
Chloride: 101
Chloride: 102
Creatinine, Ser: 0.98
Creatinine, Ser: 1.06
GFR calc Af Amer: >60
GFR calc Af Amer: >60
GFR calc non Af Amer: >60
GFR calc non Af Amer: >60
Glucose, Bld: 122 — ABNORMAL HIGH
Glucose, Bld: 130 — ABNORMAL HIGH
Potassium: 3.6
Potassium: 4
Sodium: 139

## 2010-12-19 LAB — CROSSMATCH: ABO/RH(D): A NEG

## 2010-12-19 LAB — DIFFERENTIAL
Eosinophils Absolute: 0.1
Eosinophils Relative: 2
Lymphocytes Relative: 26
Lymphs Abs: 1.5
Monocytes Absolute: 0.4
Monocytes Relative: 7

## 2010-12-19 LAB — URINALYSIS, ROUTINE W REFLEX MICROSCOPIC
Bilirubin Urine: NEGATIVE
Hgb urine dipstick: NEGATIVE
Nitrite: NEGATIVE
Specific Gravity, Urine: 1.015
pH: 5.5

## 2010-12-19 LAB — URINE CULTURE: Colony Count: 1000

## 2010-12-19 LAB — PROTIME-INR
INR: 1.1
INR: 1.6 — ABNORMAL HIGH
INR: 1.8 — ABNORMAL HIGH
Prothrombin Time: 14
Prothrombin Time: 20.7 — ABNORMAL HIGH

## 2010-12-23 LAB — I-STAT 8, (EC8 V) (CONVERTED LAB)
Chloride: 109
HCT: 40
Hemoglobin: 13.6
Operator id: 114531
Potassium: 3.7
Sodium: 141
TCO2: 25
pH, Ven: 7.43 — ABNORMAL HIGH

## 2011-03-06 ENCOUNTER — Other Ambulatory Visit: Payer: Self-pay | Admitting: Dermatology

## 2011-04-07 ENCOUNTER — Encounter (HOSPITAL_COMMUNITY): Payer: Self-pay | Admitting: Pharmacy Technician

## 2011-04-11 ENCOUNTER — Encounter (HOSPITAL_COMMUNITY)
Admission: RE | Admit: 2011-04-11 | Discharge: 2011-04-11 | Disposition: A | Discharge status: Home / Self Care | Payer: Medicare Other | Source: Ambulatory Visit | Attending: Orthopedic Surgery | Admitting: Orthopedic Surgery

## 2011-04-11 ENCOUNTER — Encounter (HOSPITAL_COMMUNITY)
Admission: RE | Admit: 2011-04-11 | Discharge: 2011-04-11 | Payer: Medicare Other | Source: Ambulatory Visit | Attending: Orthopaedic Surgery | Admitting: Orthopaedic Surgery

## 2011-04-11 ENCOUNTER — Encounter (HOSPITAL_COMMUNITY): Payer: Self-pay

## 2011-04-11 HISTORY — DX: Pneumonia, unspecified organism: J18.9

## 2011-04-11 HISTORY — DX: Unspecified osteoarthritis, unspecified site: M19.90

## 2011-04-11 HISTORY — DX: Essential (primary) hypertension: I10

## 2011-04-11 HISTORY — DX: Angina pectoris, unspecified: I20.9

## 2011-04-11 HISTORY — DX: Inflammatory liver disease, unspecified: K75.9

## 2011-04-11 HISTORY — DX: Cardiac arrhythmia, unspecified: I49.9

## 2011-04-11 HISTORY — DX: Malignant (primary) neoplasm, unspecified: C80.1

## 2011-04-11 HISTORY — DX: Sleep apnea, unspecified: G47.30

## 2011-04-11 HISTORY — DX: Chronic kidney disease, unspecified: N18.9

## 2011-04-11 LAB — DIFFERENTIAL
Basophils Relative: 0 % (ref 0–1)
Eosinophils Relative: 2 % (ref 0–5)
Monocytes Absolute: 0.7 10*3/uL (ref 0.1–1.0)
Monocytes Relative: 8 % (ref 3–12)
Neutro Abs: 6 10*3/uL (ref 1.7–7.7)

## 2011-04-11 LAB — COMPREHENSIVE METABOLIC PANEL
ALT: 8 U/L (ref 0–53)
AST: 13 U/L (ref 0–37)
Albumin: 3.9 g/dL (ref 3.5–5.2)
Alkaline Phosphatase: 57 U/L (ref 39–117)
CO2: 29 mEq/L (ref 19–32)
Chloride: 106 mEq/L (ref 96–112)
Creatinine, Ser: 1.07 mg/dL (ref 0.50–1.35)
GFR calc non Af Amer: 67 mL/min — ABNORMAL LOW (ref >90)
Potassium: 4.5 mEq/L (ref 3.5–5.1)
Sodium: 143 mEq/L (ref 135–145)
Total Bilirubin: 1.2 mg/dL (ref 0.3–1.2)

## 2011-04-11 LAB — URINALYSIS, ROUTINE W REFLEX MICROSCOPIC
Glucose, UA: NEGATIVE mg/dL
Hgb urine dipstick: NEGATIVE
Ketones, ur: NEGATIVE mg/dL
Leukocytes, UA: NEGATIVE
pH: 5.5 (ref 5.0–8.0)

## 2011-04-11 LAB — CBC
Platelets: 161 10*3/uL (ref 150–400)
RBC: 4.67 MIL/uL (ref 4.22–5.81)
RDW: 13.1 % (ref 11.5–15.5)
WBC: 8.7 10*3/uL (ref 4.0–10.5)

## 2011-04-11 LAB — APTT: aPTT: 28 seconds (ref 24–37)

## 2011-04-11 LAB — SURGICAL PCR SCREEN: MRSA, PCR: NEGATIVE

## 2011-04-11 NOTE — Pre-Procedure Instructions (Signed)
20 [name] [name]  04/11/2011   Your procedure is scheduled on:  04/22/2011  Report to [facility] at 8:30 AM.  Call this number if you have problems the morning of surgery: [phone]   Remember:   Do not eat food:After Midnight.  May have clear liquids: up to 4 Hours before arrival.  Clear liquids include soda, tea, black coffee, apple or grape juice, broth.  Take these medicines the morning of surgery with A SIP OF WATER: singulair, bystolic   Do not wear jewelry, make-up or nail polish.  Do not wear lotions, powders, or perfumes. You may wear deodorant.  Do not shave 48 hours prior to surgery.  Do not bring valuables to the hospital.  Contacts, dentures or bridgework may not be worn into surgery.  Leave suitcase in the car. After surgery it may be brought to your room.  For patients admitted to the hospital, checkout time is 11:00 AM the day of discharge.   Patients discharged the day of surgery will not be allowed to drive home.  Name and phone number of your driver: WITH WIFE  Special Instructions: CHG Shower Use Special Wash: 1/2 bottle night before surgery and 1/2 bottle morning of surgery.   Please read over the following fact sheets that you were given: Pain Booklet, Coughing and Deep Breathing, Blood Transfusion Information, MRSA Information and Surgical Site Infection Prevention

## 2011-04-12 LAB — URINE CULTURE
Colony Count: NO GROWTH
Culture  Setup Time: 201302011101
Culture: NO GROWTH

## 2011-04-16 NOTE — H&P (Signed)
CHIEF COMPLAINT:  Painful right knee.  HISTORY:  [name] [name] is a very pleasant 71 year old white married male who is seen back today for evaluation of his right knee.  He has previously had a total knee replacement on the left in 2008 by Dr. Cleophas Dunker.  He did well overall.  He is now to the point though he is having problems with his right knee.  He states that his pain is a combination of a sharp, dull, stabbing, throbbing, aching and burning pain in the right knee.  It's a constant, dull, aching and burning pain.  He's noticed weakness secondary to the fact he is unable to as much with the knee.  He is losing motion.  He is having problems in that he feels as if he is going to fall secondary to the arthritis in the knee.  It is worsening and does wake him at nighttime.  Movement and exercise worsens his symptoms.  He's tried Aleve intermittently with only minimal help.  He's having difficulty with activities of daily living and this has impacted his life significantly.  He is seen back today for evaluation.   PAST MEDICAL HISTORY:  In general his health is fair.   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  He was hospitalized in 1993 for pneumonia.    MEDICATIONS:  Azor 5/20 one q. daily in the morning, Bystolic 5 mg in the morning, Flonase 2 sprays h.s., omeprazole 40 mg h.s., calcium D6 100 mg [place] b.i.d., fish oil 1,200 mg b.i.d., Singulair 10 mg in the morning, multivitamin at night and Aleve at night.   ALLERGIES:  Theophylline where he got short of breath.  He has also had problems with lisinopril that of a cough.  Tenormin causes him significant fatigue.  Ambien bothers him to the point where he has hallucinations.   ROS:  Fourteen point review of systems is positive for bilateral cataracts.  He does use glasses and does have upper and lower dentures.  He has  occasional dysphasia and also decreased hearing.  He does have shortness of breath which he states is always and that's his baseline.  He did have pneumonia in 2007; that was his last time.  He has only been hospitalized once for that.  He did have bronchitis last year.  He has had a history of chest pain which was felt to on the basis of a hiatal hernia.  At one point Dr. Manus Gunning felt he is having a heart attack but of Dr. Mayford Knife, his cardiologist, did a cardiac catheter and stated that it may have just been his hiatal hernia.  He does have a history of hypertension and has been on medications for this for years.  He states he occasionally has palpitations maybe once a week.  GI wise he does have a history of a hiatal hernia and apparently had jaundice in 1968 and he also has hemorrhoids.  He has had asthma most of his life.  He also has easy bruising but does use Aleve.  He's had melanoma and prostate cancers.  He does have ankle swelling bilaterally and states he's been told this is on the basis of his blood pressure medicines.  He did have a kidney infection back in 1996.  He does occasionally have burning with urination and nocturia one to two times per evening.  He has headaches and migraines which are  variable to when they occur and in length.  All other symptomology is denied.  FAMILY HISTORY:  Positive or his mother who died at the age 59 from breast cancer.  Father was 42 and apparently died of old age.  He did have diabetes.  He has 2 brothers; one age 72 and one age 19.  Cancer and hypertension are in the history.  He has no other siblings.   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  He quit smoking in 1982; prior to that he smoked approximately 30 years with variable amounts but his max was four packs per day.  He does not drink and he does not chew tobacco.   EXAMINATION:  His exam today reveals a very pleasant 71 year old white male  who is well-developed and well-nourished.  He is obese, alert, pleasant and cooperative.  He is in moderate distress secondary to right knee pain.  He is 5 foot 7 inches and weighs 245 pounds.  His respiration rate is normal.  He is oriented x3.  His BMI is 38.4.  Temperature is 97.5 and pulse 58.  Respirations 18.  Blood pressure 132/74.  Head is normocephalic. Ears nose and throat were benign. Neck was supple; no bruits are noted. Chest had good expansion Lungs were essentially clear with occasional wheeze. Cardiac had regular rhythm and rate; normal S1-S2; no discrete murmurs rubs gallops are appreciated.  Pulses were 1+ in his lower extremities. Genital, rectal and breast exam not indicated for the orthopaedic evaluation. Abdomen is scaphoid, soft nontender; no mass palpable and normal bowel sounds present.   CNS:  He is oriented x3 and cranial nerves II through XII grossly intact. Musculoskeletal:  Range of motion is from approximately 5 degrees to 110 degrees.  He has bilateral joint line pain which is worse medially than laterally.  He does have pseudo-laxity of the right knee with valgus stressing and he opens medially.  McMurray's is positive.  Calf is supple and nontender.  He is neurovascularly intact distally.    I have reviewed documentation from Dr. Manus Gunning from his office note from 03/27/2011.  He feels that he is medically and cardiac stable to undergo a total knee replacement.  He felt that he was of average risk for complications.  I reviewed the EKG and laboratory studies that he has sent.   CLINICAL IMPRESSION:  1. OA of the right knee. 2. Essential hypertension. 3. History of melanoma. 4. History of prostate cancer. 5. History of hiatal hernia. 6. History of asthma. 7. History migraines. 8. Exogenous obesity.  RECOMMENDATIONS:  At this time I have discussed with him total knee replacement procedure risks and benefits.  I have discussed this in detail.  I have answered  all his questions that he has as well as with his wife.  He is going to probably need SNF postoperatively and we'll need to arrange for that.  Therefore we are going to schedule and have him seen at the hospital for his blood work and pending this we will decide as to whether we will perform a total knee replacement.   [name] Drone Santiago Bumpers, PA-C 04/16/2011 3:16 PM

## 2011-04-21 MED ORDER — SODIUM CHLORIDE 0.9 % IV SOLN: INTRAVENOUS | Status: DC

## 2011-04-21 MED ORDER — CEFAZOLIN SODIUM-DEXTROSE 2-3 GM-% IV SOLR
2.0000 g | INTRAVENOUS | Status: AC
  Administered 2011-04-22: 2 g via INTRAVENOUS
  Filled 2011-04-21: qty 50

## 2011-04-21 MED ORDER — CHLORHEXIDINE GLUCONATE 4 % EX LIQD
60.0000 mL | Freq: Once | CUTANEOUS | Status: DC
  Filled 2011-04-21: qty 60

## 2011-04-21 MED ORDER — CHLORHEXIDINE GLUCONATE 4 % EX LIQD
60.0000 mL | Freq: Every day | CUTANEOUS | Status: DC
  Filled 2011-04-21: qty 60

## 2011-04-22 ENCOUNTER — Encounter (HOSPITAL_COMMUNITY)
Admission: RE | Disposition: A | Discharge status: Home Health | Payer: Self-pay | Source: Ambulatory Visit | Attending: Orthopaedic Surgery

## 2011-04-22 ENCOUNTER — Inpatient Hospital Stay (HOSPITAL_COMMUNITY)
Admission: RE | Admit: 2011-04-22 | Discharge: 2011-04-25 | DRG: 470 | Disposition: A | Discharge status: Home Health | Payer: Medicare Other | Source: Ambulatory Visit | Attending: Orthopaedic Surgery | Admitting: Orthopaedic Surgery

## 2011-04-22 ENCOUNTER — Encounter (HOSPITAL_COMMUNITY): Payer: Self-pay | Admitting: Anesthesiology

## 2011-04-22 ENCOUNTER — Encounter (HOSPITAL_COMMUNITY): Payer: Self-pay | Admitting: *Deleted

## 2011-04-22 DIAGNOSIS — Z888 Allergy status to other drugs, medicaments and biological substances status: Secondary | ICD-10-CM

## 2011-04-22 DIAGNOSIS — Z85820 Personal history of malignant melanoma of skin: Secondary | ICD-10-CM

## 2011-04-22 DIAGNOSIS — J45909 Unspecified asthma, uncomplicated: Secondary | ICD-10-CM | POA: Diagnosis present

## 2011-04-22 DIAGNOSIS — D62 Acute posthemorrhagic anemia: Secondary | ICD-10-CM | POA: Diagnosis not present

## 2011-04-22 DIAGNOSIS — E669 Obesity, unspecified: Secondary | ICD-10-CM | POA: Diagnosis present

## 2011-04-22 DIAGNOSIS — Z01812 Encounter for preprocedural laboratory examination: Secondary | ICD-10-CM

## 2011-04-22 DIAGNOSIS — M1710 Unilateral primary osteoarthritis, unspecified knee: Principal | ICD-10-CM | POA: Diagnosis present

## 2011-04-22 DIAGNOSIS — Z96659 Presence of unspecified artificial knee joint: Secondary | ICD-10-CM

## 2011-04-22 DIAGNOSIS — C439 Malignant melanoma of skin, unspecified: Secondary | ICD-10-CM | POA: Diagnosis not present

## 2011-04-22 DIAGNOSIS — Z6838 Body mass index (BMI) 38.0-38.9, adult: Secondary | ICD-10-CM

## 2011-04-22 DIAGNOSIS — I129 Hypertensive chronic kidney disease with stage 1 through stage 4 chronic kidney disease, or unspecified chronic kidney disease: Secondary | ICD-10-CM | POA: Diagnosis present

## 2011-04-22 DIAGNOSIS — G4730 Sleep apnea, unspecified: Secondary | ICD-10-CM | POA: Diagnosis present

## 2011-04-22 DIAGNOSIS — K449 Diaphragmatic hernia without obstruction or gangrene: Secondary | ICD-10-CM | POA: Diagnosis present

## 2011-04-22 DIAGNOSIS — E876 Hypokalemia: Secondary | ICD-10-CM | POA: Diagnosis not present

## 2011-04-22 DIAGNOSIS — M179 Osteoarthritis of knee, unspecified: Secondary | ICD-10-CM | POA: Diagnosis present

## 2011-04-22 DIAGNOSIS — N189 Chronic kidney disease, unspecified: Secondary | ICD-10-CM | POA: Diagnosis present

## 2011-04-22 DIAGNOSIS — I10 Essential (primary) hypertension: Secondary | ICD-10-CM | POA: Diagnosis present

## 2011-04-22 DIAGNOSIS — Z8546 Personal history of malignant neoplasm of prostate: Secondary | ICD-10-CM | POA: Diagnosis not present

## 2011-04-22 DIAGNOSIS — Z79899 Other long term (current) drug therapy: Secondary | ICD-10-CM

## 2011-04-22 DIAGNOSIS — IMO0001 Reserved for inherently not codable concepts without codable children: Secondary | ICD-10-CM | POA: Diagnosis present

## 2011-04-22 HISTORY — PX: TOTAL KNEE ARTHROPLASTY: SHX125

## 2011-04-22 LAB — TYPE AND SCREEN: Antibody Screen: NEGATIVE

## 2011-04-22 SURGERY — ARTHROPLASTY, KNEE, TOTAL
Anesthesia: Regional | Site: Knee | Laterality: Right | Wound class: Clean

## 2011-04-22 MED ORDER — OLMESARTAN MEDOXOMIL 20 MG PO TABS
20.0000 mg | ORAL_TABLET | Freq: Every day | ORAL | Status: DC
  Administered 2011-04-23 – 2011-04-25 (×3): 20 mg via ORAL
  Filled 2011-04-22 (×3): qty 1

## 2011-04-22 MED ORDER — GLYCOPYRROLATE 0.2 MG/ML IJ SOLN
INTRAMUSCULAR | Status: DC | PRN
  Administered 2011-04-22: 1 mg via INTRAVENOUS

## 2011-04-22 MED ORDER — FENTANYL CITRATE 0.05 MG/ML IJ SOLN
INTRAMUSCULAR | Status: DC | PRN
  Administered 2011-04-22 (×2): 25 ug via INTRAVENOUS
  Administered 2011-04-22: 50 ug via INTRAVENOUS
  Administered 2011-04-22: 150 ug via INTRAVENOUS

## 2011-04-22 MED ORDER — NALOXONE HCL 0.4 MG/ML IJ SOLN: 0.4000 mg | INTRAMUSCULAR | Status: DC | PRN

## 2011-04-22 MED ORDER — HYPROMELLOSE (GONIOSCOPIC) 2.5 % OP SOLN
1.0000 [drp] | Freq: Three times a day (TID) | OPHTHALMIC | Status: DC | PRN
  Filled 2011-04-22: qty 15

## 2011-04-22 MED ORDER — PANTOPRAZOLE SODIUM 40 MG PO TBEC
40.0000 mg | DELAYED_RELEASE_TABLET | Freq: Every day | ORAL | Status: DC
  Administered 2011-04-22 – 2011-04-25 (×4): 40 mg via ORAL
  Filled 2011-04-22 (×4): qty 1

## 2011-04-22 MED ORDER — BUPIVACAINE-EPINEPHRINE 0.25% -1:200000 IJ SOLN
INTRAMUSCULAR | Status: DC | PRN
  Administered 2011-04-22: 30 mL

## 2011-04-22 MED ORDER — AMLODIPINE BESYLATE 5 MG PO TABS
5.0000 mg | ORAL_TABLET | Freq: Every day | ORAL | Status: DC
  Administered 2011-04-23 – 2011-04-25 (×3): 5 mg via ORAL
  Filled 2011-04-22 (×3): qty 1

## 2011-04-22 MED ORDER — MONTELUKAST SODIUM 10 MG PO TABS
10.0000 mg | ORAL_TABLET | Freq: Every day | ORAL | Status: DC
  Administered 2011-04-23 – 2011-04-25 (×3): 10 mg via ORAL
  Filled 2011-04-22 (×4): qty 1

## 2011-04-22 MED ORDER — ONDANSETRON HCL 4 MG/2ML IJ SOLN: 4.0000 mg | Freq: Four times a day (QID) | INTRAMUSCULAR | Status: DC | PRN

## 2011-04-22 MED ORDER — NEBIVOLOL HCL 5 MG PO TABS
5.0000 mg | ORAL_TABLET | Freq: Every morning | ORAL | Status: DC
  Administered 2011-04-23 – 2011-04-25 (×3): 5 mg via ORAL
  Filled 2011-04-22 (×3): qty 1

## 2011-04-22 MED ORDER — MORPHINE SULFATE 2 MG/ML IJ SOLN: 0.0500 mg/kg | INTRAMUSCULAR | Status: DC | PRN

## 2011-04-22 MED ORDER — LACTATED RINGERS IV SOLN: INTRAVENOUS | Status: DC

## 2011-04-22 MED ORDER — KETOROLAC TROMETHAMINE 15 MG/ML IJ SOLN
15.0000 mg | Freq: Once | INTRAMUSCULAR | Status: AC
  Administered 2011-04-22: 15 mg via INTRAVENOUS
  Filled 2011-04-22: qty 1

## 2011-04-22 MED ORDER — ONDANSETRON HCL 4 MG/2ML IJ SOLN
INTRAMUSCULAR | Status: DC | PRN
  Administered 2011-04-22: 4 mg via INTRAVENOUS

## 2011-04-22 MED ORDER — FENTANYL CITRATE 0.05 MG/ML IJ SOLN
INTRAMUSCULAR | Status: AC
  Filled 2011-04-22: qty 2

## 2011-04-22 MED ORDER — DOCUSATE SODIUM 100 MG PO CAPS
100.0000 mg | ORAL_CAPSULE | Freq: Two times a day (BID) | ORAL | Status: DC
  Administered 2011-04-22 – 2011-04-25 (×7): 100 mg via ORAL
  Filled 2011-04-22 (×7): qty 1

## 2011-04-22 MED ORDER — MENTHOL 3 MG MT LOZG: 1.0000 | LOZENGE | OROMUCOSAL | Status: DC | PRN

## 2011-04-22 MED ORDER — ONDANSETRON HCL 4 MG PO TABS: 4.0000 mg | ORAL_TABLET | Freq: Four times a day (QID) | ORAL | Status: DC | PRN

## 2011-04-22 MED ORDER — KETOROLAC TROMETHAMINE 15 MG/ML IJ SOLN
15.0000 mg | Freq: Four times a day (QID) | INTRAMUSCULAR | Status: DC
  Administered 2011-04-22: 15 mg via INTRAVENOUS

## 2011-04-22 MED ORDER — PROMETHAZINE HCL 25 MG/ML IJ SOLN: 6.2500 mg | INTRAMUSCULAR | Status: DC | PRN

## 2011-04-22 MED ORDER — LACTATED RINGERS IV SOLN
INTRAVENOUS | Status: DC
  Administered 2011-04-22: 10:00:00 via INTRAVENOUS

## 2011-04-22 MED ORDER — HYDROMORPHONE HCL PF 1 MG/ML IJ SOLN
INTRAMUSCULAR | Status: AC
  Filled 2011-04-22: qty 1

## 2011-04-22 MED ORDER — ACETAMINOPHEN 10 MG/ML IV SOLN
1000.0000 mg | Freq: Once | INTRAVENOUS | Status: AC
  Administered 2011-04-22: 1000 mg via INTRAVENOUS
  Filled 2011-04-22: qty 100

## 2011-04-22 MED ORDER — NEOSTIGMINE METHYLSULFATE 1 MG/ML IJ SOLN
INTRAMUSCULAR | Status: DC | PRN
  Administered 2011-04-22: 5 mg via INTRAVENOUS

## 2011-04-22 MED ORDER — OXYCODONE HCL 5 MG PO TABS
5.0000 mg | ORAL_TABLET | ORAL | Status: DC | PRN
  Administered 2011-04-23 – 2011-04-25 (×13): 10 mg via ORAL
  Filled 2011-04-22 (×13): qty 2

## 2011-04-22 MED ORDER — SODIUM CHLORIDE 0.9 % IV SOLN
INTRAVENOUS | Status: DC
  Administered 2011-04-22: 16:00:00 via INTRAVENOUS

## 2011-04-22 MED ORDER — ROCURONIUM BROMIDE 100 MG/10ML IV SOLN
INTRAVENOUS | Status: DC | PRN
  Administered 2011-04-22: 50 mg via INTRAVENOUS

## 2011-04-22 MED ORDER — METHOCARBAMOL 100 MG/ML IJ SOLN
500.0000 mg | INTRAVENOUS | Status: AC
  Administered 2011-04-22: 500 mg via INTRAVENOUS
  Filled 2011-04-22: qty 5

## 2011-04-22 MED ORDER — HYDROMORPHONE 0.3 MG/ML IV SOLN
INTRAVENOUS | Status: DC
  Administered 2011-04-22: 0.599 mg via INTRAVENOUS
  Administered 2011-04-22: 14:00:00 via INTRAVENOUS
  Administered 2011-04-22: 0.56 mg via INTRAVENOUS
  Administered 2011-04-23: 1.19 mg via INTRAVENOUS
  Administered 2011-04-23: 0.99 mg via INTRAVENOUS

## 2011-04-22 MED ORDER — FENTANYL CITRATE 0.05 MG/ML IJ SOLN
100.0000 ug | Freq: Once | INTRAMUSCULAR | Status: AC
  Administered 2011-04-22: 100 ug via INTRAVENOUS

## 2011-04-22 MED ORDER — MEPERIDINE HCL 25 MG/ML IJ SOLN: 6.2500 mg | INTRAMUSCULAR | Status: DC | PRN

## 2011-04-22 MED ORDER — POLYVINYL ALCOHOL 1.4 % OP SOLN
1.0000 [drp] | Freq: Three times a day (TID) | OPHTHALMIC | Status: DC | PRN
  Filled 2011-04-22: qty 15

## 2011-04-22 MED ORDER — PHENOL 1.4 % MT LIQD
1.0000 | OROMUCOSAL | Status: DC | PRN
  Filled 2011-04-22: qty 177

## 2011-04-22 MED ORDER — HYDROMORPHONE 0.3 MG/ML IV SOLN
INTRAVENOUS | Status: AC
  Filled 2011-04-22: qty 25

## 2011-04-22 MED ORDER — RIVAROXABAN 10 MG PO TABS
10.0000 mg | ORAL_TABLET | ORAL | Status: DC
  Filled 2011-04-22: qty 1

## 2011-04-22 MED ORDER — ACETAMINOPHEN 10 MG/ML IV SOLN
1000.0000 mg | Freq: Four times a day (QID) | INTRAVENOUS | Status: AC
  Administered 2011-04-22 – 2011-04-23 (×4): 1000 mg via INTRAVENOUS
  Filled 2011-04-22 (×4): qty 100

## 2011-04-22 MED ORDER — DIPHENHYDRAMINE HCL 12.5 MG/5ML PO ELIX
12.5000 mg | ORAL_SOLUTION | Freq: Four times a day (QID) | ORAL | Status: DC | PRN
  Filled 2011-04-22: qty 5

## 2011-04-22 MED ORDER — SODIUM CHLORIDE 0.9 % IR SOLN
Status: DC | PRN
  Administered 2011-04-22: 1000 mL

## 2011-04-22 MED ORDER — METOCLOPRAMIDE HCL 10 MG PO TABS
5.0000 mg | ORAL_TABLET | Freq: Three times a day (TID) | ORAL | Status: DC | PRN
  Administered 2011-04-23: 10 mg via ORAL
  Filled 2011-04-22: qty 1

## 2011-04-22 MED ORDER — PROPOFOL 10 MG/ML IV EMUL
INTRAVENOUS | Status: DC | PRN
  Administered 2011-04-22: 180 mg via INTRAVENOUS

## 2011-04-22 MED ORDER — KETOROLAC TROMETHAMINE 30 MG/ML IJ SOLN
INTRAMUSCULAR | Status: AC
  Filled 2011-04-22: qty 1

## 2011-04-22 MED ORDER — HETASTARCH-ELECTROLYTES 6 % IV SOLN
INTRAVENOUS | Status: DC | PRN
  Administered 2011-04-22: 11:00:00 via INTRAVENOUS

## 2011-04-22 MED ORDER — DIPHENHYDRAMINE HCL 50 MG/ML IJ SOLN: 12.5000 mg | Freq: Four times a day (QID) | INTRAMUSCULAR | Status: DC | PRN

## 2011-04-22 MED ORDER — CEFAZOLIN SODIUM-DEXTROSE 2-3 GM-% IV SOLR
2.0000 g | Freq: Four times a day (QID) | INTRAVENOUS | Status: AC
  Administered 2011-04-22 – 2011-04-23 (×3): 2 g via INTRAVENOUS
  Filled 2011-04-22 (×3): qty 50

## 2011-04-22 MED ORDER — MIDAZOLAM HCL 2 MG/2ML IJ SOLN
INTRAMUSCULAR | Status: AC
Start: 2011-04-22 — End: 2011-04-22
  Filled 2011-04-22: qty 2

## 2011-04-22 MED ORDER — FLUTICASONE PROPIONATE 50 MCG/ACT NA SUSP
2.0000 | Freq: Every day | NASAL | Status: DC
  Administered 2011-04-22 – 2011-04-23 (×2): 2 via NASAL
  Filled 2011-04-22: qty 16

## 2011-04-22 MED ORDER — METOCLOPRAMIDE HCL 5 MG/ML IJ SOLN
5.0000 mg | Freq: Three times a day (TID) | INTRAMUSCULAR | Status: DC | PRN
  Filled 2011-04-22: qty 2

## 2011-04-22 MED ORDER — HYDROMORPHONE HCL PF 1 MG/ML IJ SOLN
0.2500 mg | INTRAMUSCULAR | Status: DC | PRN
  Administered 2011-04-22 (×3): 0.5 mg via INTRAVENOUS

## 2011-04-22 MED ORDER — METHOCARBAMOL 100 MG/ML IJ SOLN
500.0000 mg | Freq: Four times a day (QID) | INTRAVENOUS | Status: DC | PRN
  Filled 2011-04-22: qty 5

## 2011-04-22 MED ORDER — METHOCARBAMOL 500 MG PO TABS
500.0000 mg | ORAL_TABLET | Freq: Four times a day (QID) | ORAL | Status: DC | PRN
  Administered 2011-04-23 – 2011-04-25 (×6): 500 mg via ORAL
  Filled 2011-04-22 (×6): qty 1

## 2011-04-22 MED ORDER — AMLODIPINE-OLMESARTAN 5-20 MG PO TABS: 1.0000 | ORAL_TABLET | ORAL | Status: DC

## 2011-04-22 MED ORDER — MIDAZOLAM HCL 5 MG/5ML IJ SOLN
INTRAMUSCULAR | Status: DC | PRN
  Administered 2011-04-22: 2 mg via INTRAVENOUS

## 2011-04-22 MED ORDER — BISACODYL 10 MG RE SUPP: 10.0000 mg | Freq: Every day | RECTAL | Status: DC | PRN

## 2011-04-22 MED ORDER — RIVAROXABAN 10 MG PO TABS
10.0000 mg | ORAL_TABLET | Freq: Every day | ORAL | Status: DC
  Filled 2011-04-22 (×2): qty 1

## 2011-04-22 MED ORDER — LACTATED RINGERS IV SOLN
INTRAVENOUS | Status: DC | PRN
  Administered 2011-04-22 (×2): via INTRAVENOUS

## 2011-04-22 MED ORDER — SODIUM CHLORIDE 0.9 % IJ SOLN: 9.0000 mL | INTRAMUSCULAR | Status: DC | PRN

## 2011-04-22 MED ORDER — MIDAZOLAM HCL 5 MG/ML IJ SOLN
2.0000 mg | Freq: Once | INTRAMUSCULAR | Status: AC
  Administered 2011-04-22: 2 mg via INTRAVENOUS

## 2011-04-22 SURGICAL SUPPLY — 65 items
BANDAGE ESMARK 6X9 LF (GAUZE/BANDAGES/DRESSINGS) ×1 IMPLANT
BLADE SAGITTAL 25.0X1.19X90 (BLADE) ×2 IMPLANT
BNDG ESMARK 6X9 LF (GAUZE/BANDAGES/DRESSINGS) ×2
BOWL SMART MIX CTS (DISPOSABLE) ×2 IMPLANT
CEMENT HV SMART SET (Cement) ×4 IMPLANT
CLOTH BEACON ORANGE TIMEOUT ST (SAFETY) ×2 IMPLANT
COVER BACK TABLE 24X17X13 BIG (DRAPES) ×2 IMPLANT
COVER SURGICAL LIGHT HANDLE (MISCELLANEOUS) ×2 IMPLANT
CUFF TOURNIQUET SINGLE 34IN LL (TOURNIQUET CUFF) ×2 IMPLANT
CUFF TOURNIQUET SINGLE 44IN (TOURNIQUET CUFF) IMPLANT
DRAPE EXTREMITY T 121X128X90 (DRAPE) ×2 IMPLANT
DRAPE PROXIMA HALF (DRAPES) ×2 IMPLANT
DRSG ADAPTIC 3X8 NADH LF (GAUZE/BANDAGES/DRESSINGS) IMPLANT
DRSG PAD ABDOMINAL 8X10 ST (GAUZE/BANDAGES/DRESSINGS) IMPLANT
DURAPREP 26ML APPLICATOR (WOUND CARE) ×2 IMPLANT
ELECT CAUTERY BLADE 6.4 (BLADE) ×2 IMPLANT
ELECT REM PT RETURN 9FT ADLT (ELECTROSURGICAL) ×2
ELECTRODE REM PT RTRN 9FT ADLT (ELECTROSURGICAL) ×1 IMPLANT
EVACUATOR 1/8 PVC DRAIN (DRAIN) ×2 IMPLANT
FACESHIELD LNG OPTICON STERILE (SAFETY) ×4 IMPLANT
FLOSEAL 10ML (HEMOSTASIS) IMPLANT
GAUZE SPONGE 4X4 12PLY STRL LF (GAUZE/BANDAGES/DRESSINGS) ×2 IMPLANT
GLOVE BIOGEL PI IND STRL 7.0 (GLOVE) ×1 IMPLANT
GLOVE BIOGEL PI IND STRL 8 (GLOVE) ×1 IMPLANT
GLOVE BIOGEL PI IND STRL 8.5 (GLOVE) ×1 IMPLANT
GLOVE BIOGEL PI INDICATOR 7.0 (GLOVE) ×1
GLOVE BIOGEL PI INDICATOR 8 (GLOVE) ×1
GLOVE BIOGEL PI INDICATOR 8.5 (GLOVE) ×1
GLOVE ECLIPSE 8.0 STRL XLNG CF (GLOVE) ×6 IMPLANT
GLOVE SURG ORTHO 8.5 STRL (GLOVE) ×4 IMPLANT
GLOVE SURG SS PI 6.5 STRL IVOR (GLOVE) ×2 IMPLANT
GOWN BRE IMP PREV XXLGXLNG (GOWN DISPOSABLE) ×2 IMPLANT
GOWN PREVENTION PLUS XLARGE (GOWN DISPOSABLE) ×2 IMPLANT
GOWN PREVENTION PLUS XXLARGE (GOWN DISPOSABLE) ×2 IMPLANT
GOWN STRL NON-REIN LRG LVL3 (GOWN DISPOSABLE) IMPLANT
GOWN STRL REIN XL XLG (GOWN DISPOSABLE) ×2 IMPLANT
HANDPIECE INTERPULSE COAX TIP (DISPOSABLE) ×1
KIT BASIN OR (CUSTOM PROCEDURE TRAY) ×2 IMPLANT
KIT ROOM TURNOVER OR (KITS) ×2 IMPLANT
MANIFOLD NEPTUNE II (INSTRUMENTS) ×2 IMPLANT
MARKER SPHERE PSV REFLC THRD 5 (MARKER) IMPLANT
NEEDLE 22X1 1/2 (OR ONLY) (NEEDLE) ×2 IMPLANT
NS IRRIG 1000ML POUR BTL (IV SOLUTION) ×2 IMPLANT
PACK TOTAL JOINT (CUSTOM PROCEDURE TRAY) ×2 IMPLANT
PAD ARMBOARD 7.5X6 YLW CONV (MISCELLANEOUS) ×4 IMPLANT
PAD CAST 4YDX4 CTTN HI CHSV (CAST SUPPLIES) ×1 IMPLANT
PADDING CAST COTTON 4X4 STRL (CAST SUPPLIES) ×1
PADDING CAST COTTON 6X4 STRL (CAST SUPPLIES) IMPLANT
PIN SCHANZ 4MM 130MM (PIN) IMPLANT
SET HNDPC FAN SPRY TIP SCT (DISPOSABLE) ×1 IMPLANT
SPONGE GAUZE 4X4 12PLY (GAUZE/BANDAGES/DRESSINGS) IMPLANT
STAPLER VISISTAT 35W (STAPLE) ×2 IMPLANT
SUCTION FRAZIER TIP 10 FR DISP (SUCTIONS) ×2 IMPLANT
SUT BONE WAX W31G (SUTURE) ×2 IMPLANT
SUT ETHIBOND NAB CT1 #1 30IN (SUTURE) ×6 IMPLANT
SUT MNCRL AB 3-0 PS2 18 (SUTURE) ×2 IMPLANT
SUT VIC AB 0 CT1 27 (SUTURE) ×1
SUT VIC AB 0 CT1 27XBRD ANBCTR (SUTURE) ×1 IMPLANT
SUT VIC AB 1 CT1 27 (SUTURE) ×2
SUT VIC AB 1 CT1 27XBRD ANBCTR (SUTURE) ×2 IMPLANT
SYR CONTROL 10ML LL (SYRINGE) ×2 IMPLANT
TOWEL OR 17X24 6PK STRL BLUE (TOWEL DISPOSABLE) ×2 IMPLANT
TOWEL OR 17X26 10 PK STRL BLUE (TOWEL DISPOSABLE) ×2 IMPLANT
TRAY FOLEY CATH 14FR (SET/KITS/TRAYS/PACK) ×2 IMPLANT
WATER STERILE IRR 1000ML POUR (IV SOLUTION) ×4 IMPLANT

## 2011-04-22 NOTE — Transfer of Care (Signed)
Immediate Anesthesia Transfer of Care Note  Patient: [name] [name]  Procedure(s) Performed: Procedure(s) (LRB): TOTAL KNEE ARTHROPLASTY (Right)  Patient Location: PACU  Anesthesia Type: General and Regional  Level of Consciousness: awake  Airway & Oxygen Therapy: Patient Spontanous Breathing and Patient connected to nasal cannula oxygen  Post-op Assessment: Report given to PACU RN and Post -op Vital signs reviewed and stable  Post vital signs: Reviewed and stable  Complications: No apparent anesthesia complications

## 2011-04-22 NOTE — Anesthesia Procedure Notes (Addendum)
Procedure Name: Intubation Date/Time: 04/22/2011 10:55 AM Performed by: Margaree Mackintosh Pre-anesthesia Checklist: Patient identified, Timeout performed, Emergency Drugs available, Patient being monitored and Suction available Patient Re-evaluated:Patient Re-evaluated prior to inductionOxygen Delivery Method: Circle System Utilized Preoxygenation: Pre-oxygenation with 100% oxygen Intubation Type: IV induction Ventilation: Mask ventilation without difficulty and Oral airway inserted - appropriate to patient size Laryngoscope Size: Mac and 3 Grade View: Grade I Tube type: Oral Tube size: 7.5 mm Number of attempts: 1 Airway Equipment and Method: stylet Placement Confirmation: ETT inserted through vocal cords under direct vision,  positive ETCO2 and breath sounds checked- equal and bilateral Secured at: 22 cm Tube secured with: Tape Dental Injury: Teeth and Oropharynx as per pre-operative assessment    Anesthesia Regional Block:  Femoral nerve block  Pre-Anesthetic Checklist: ,, timeout performed, Correct Patient, Correct Site, Correct Laterality, Correct Procedure, Correct Position, site marked, Risks and benefits discussed, Surgical consent,  Pre-op evaluation,  Post-op pain management  Laterality: Right  Prep: chloraprep       Needles:   Needle Type: Echogenic Needle          Additional Needles:  Procedures: nerve stimulator Femoral nerve block  Nerve Stimulator or Paresthesia:  Response: 0.5 mA,   Additional Responses:   Narrative:  Start time: 04/22/2011 9:45 AM End time: 04/22/2011 10:05 AM Injection made incrementally with aspirations every 5 mL.  Performed by: Personally

## 2011-04-22 NOTE — Anesthesia Preprocedure Evaluation (Addendum)
Anesthesia Evaluation  Patient identified by MRN, date of birth, ID band Patient awake    Reviewed: Allergy & Precautions, H&P , NPO status , Patient's Chart, lab work & pertinent test results, reviewed documented beta blocker date and time   Airway Mallampati: II      Dental  (+) Teeth Intact and Dental Advisory Given   Pulmonary asthma , sleep apnea , pneumonia ,  clear to auscultation        Cardiovascular hypertension, Pt. on medications and Pt. on home beta blockers + angina + dysrhythmias Regular Normal    Neuro/Psych    GI/Hepatic negative GI ROS,   Endo/Other  Negative Endocrine ROS  Renal/GU negative Renal ROS     Musculoskeletal   Abdominal (+) obese,   Peds  Hematology   Anesthesia Other Findings   Reproductive/Obstetrics                         Anesthesia Physical Anesthesia Plan  ASA: III  Anesthesia Plan: Regional and General   Post-op Pain Management:    Induction: Intravenous  Airway Management Planned: Oral ETT  Additional Equipment:   Intra-op Plan:   Post-operative Plan: Extubation in OR  Informed Consent: I have reviewed the patients History and Physical, chart, labs and discussed the procedure including the risks, benefits and alternatives for the proposed anesthesia with the patient or authorized representative who has indicated his/her understanding and acceptance.   Dental advisory given  Plan Discussed with: CRNA and Anesthesiologist  Anesthesia Plan Comments:        Anesthesia Quick Evaluation

## 2011-04-22 NOTE — Interval H&P Note (Signed)
History and Physical Interval Note:  04/22/2011 10:35 AM  [name] [name]  has presented today for surgery, with the diagnosis of osteoarthritis right knee  The various methods of treatment have been discussed with the patient and family. After consideration of risks, benefits and other options for treatment, the patient has consented to  Procedure(s) (LRB): TOTAL KNEE ARTHROPLASTY (Right) as a surgical intervention .  The patients' history has been reviewed, patient examined, no change in status, stable for surgery.  I have reviewed the patients' chart and labs.  Questions were answered to the patient's satisfaction.     Karyss Frese W  There has been no change in health status since  the current H&P.I have examined the patient and discussed the surgery. No contraindications to the planned procedure exist.

## 2011-04-22 NOTE — Plan of Care (Signed)
Problem: Consults Goal: Diagnosis- Total Joint Replacement Outcome: Completed/Met Date Met:  04/22/11 Primary Total Knee Right

## 2011-04-22 NOTE — Brief Op Note (Signed)
04/22/2011  12:38 PM  PATIENT:  Kennith Gain  71 y.o. male  PRE-OPERATIVE DIAGNOSIS:  end stage osteoarthritis right knee  POST-OPERATIVE DIAGNOSIS:  end stage osteoarthritis right knee  PROCEDURE:  Procedure(s) (LRB): TOTAL KNEE ARTHROPLASTY (Right)  SURGEON:  Surgeon(s) and Role:    * Valeria Batman, MD - Primary  PHYSICIAN ASSISTANT: Silvestre Moment  ASSISTANTS:  ANESTHESIA:   regional and general  EBL:  Total I/O In: 1500 [I.V.:1000; IV Piggyback:500] Out: -   BLOOD ADMINISTERED:none  DRAINS: Penrose drain in the knee   LOCAL MEDICATIONS USED:  MARCAINE     SPECIMEN:  No Specimen  DISPOSITION OF SPECIMEN:  N/A  COUNTS:  YES  TOURNIQUET:   Total Tourniquet Time Documented: Thigh (Right) - 72 minutes  DICTATION: .Other Dictation: Dictation Number [id number]  PLAN OF CARE: Admit to inpatient   PATIENT DISPOSITION:  PACU - hemodynamically stable.   Delay start of Pharmacological VTE agent (>24hrs) due to surgical blood loss or risk of bleeding: not applicable

## 2011-04-22 NOTE — Preoperative (Signed)
Beta Blockers   Reason not to administer Beta Blockers:Not Applicable. Bystolic 04/22/11 @0600 

## 2011-04-22 NOTE — Progress Notes (Signed)
Orthopedic Tech Progress Note Patient Details:  [name] [name] 1940-09-21 [id number]  CPM Right Knee CPM Right Knee: On Right Knee Flexion (Degrees): 60  Right Knee Extension (Degrees): 0    [name] 04/22/2011, 1:33 PM

## 2011-04-22 NOTE — Interval H&P Note (Signed)
History and Physical Interval Note:  04/22/2011 10:34 AM  [name] [name]  has presented today for surgery, with the diagnosis of osteoarthritis right knee  The various methods of treatment have been discussed with the patient and family. After consideration of risks, benefits and other options for treatment, the patient has consented to  Procedure(s) (LRB): TOTAL KNEE ARTHROPLASTY (Right) as a surgical intervention .  The patients' history has been reviewed, patient examined, no change in status, stable for surgery.  I have reviewed the patients' chart and labs.  Questions were answered to the patient's satisfaction.     [name]

## 2011-04-22 NOTE — Anesthesia Postprocedure Evaluation (Signed)
  Anesthesia Post-op Note  Patient: [name] [name]  Procedure(s) Performed: Procedure(s) (LRB): TOTAL KNEE ARTHROPLASTY (Right)  Patient Location: PACU  Anesthesia Type: GA combined with regional for post-op pain  Level of Consciousness: awake and alert   Airway and Oxygen Therapy: Patient Spontanous Breathing and Patient connected to nasal cannula oxygen  Post-op Pain: mild  Post-op Assessment: Post-op Vital signs reviewed, Patient's Cardiovascular Status Stable, Respiratory Function Stable, Patent Airway and No signs of Nausea or vomiting  Post-op Vital Signs: Reviewed and stable  Complications: No apparent anesthesia complications

## 2011-04-23 ENCOUNTER — Encounter (HOSPITAL_COMMUNITY): Payer: Self-pay | Admitting: Orthopaedic Surgery

## 2011-04-23 DIAGNOSIS — Z8546 Personal history of malignant neoplasm of prostate: Secondary | ICD-10-CM | POA: Diagnosis not present

## 2011-04-23 DIAGNOSIS — C439 Malignant melanoma of skin, unspecified: Secondary | ICD-10-CM | POA: Diagnosis not present

## 2011-04-23 DIAGNOSIS — I10 Essential (primary) hypertension: Secondary | ICD-10-CM | POA: Diagnosis present

## 2011-04-23 DIAGNOSIS — IMO0001 Reserved for inherently not codable concepts without codable children: Secondary | ICD-10-CM | POA: Diagnosis present

## 2011-04-23 DIAGNOSIS — J45909 Unspecified asthma, uncomplicated: Secondary | ICD-10-CM | POA: Diagnosis present

## 2011-04-23 DIAGNOSIS — M1710 Unilateral primary osteoarthritis, unspecified knee: Secondary | ICD-10-CM | POA: Diagnosis present

## 2011-04-23 DIAGNOSIS — K449 Diaphragmatic hernia without obstruction or gangrene: Secondary | ICD-10-CM | POA: Diagnosis present

## 2011-04-23 LAB — CBC
MCH: 30.3 pg (ref 26.0–34.0)
MCV: 89.2 fL (ref 78.0–100.0)
Platelets: 132 10*3/uL — ABNORMAL LOW (ref 150–400)
RDW: 13.2 % (ref 11.5–15.5)

## 2011-04-23 LAB — BASIC METABOLIC PANEL
Calcium: 8.4 mg/dL (ref 8.4–10.5)
Creatinine, Ser: 1.07 mg/dL (ref 0.50–1.35)
GFR calc Af Amer: 78 mL/min — ABNORMAL LOW (ref >90)

## 2011-04-23 LAB — GLUCOSE, CAPILLARY
Glucose-Capillary: 111 mg/dL — ABNORMAL HIGH (ref 70–99)
Glucose-Capillary: 151 mg/dL — ABNORMAL HIGH (ref 70–99)

## 2011-04-23 MED ORDER — ALUM & MAG HYDROXIDE-SIMETH 200-200-20 MG/5ML PO SUSP: 30.0000 mL | Freq: Four times a day (QID) | ORAL | Status: DC | PRN

## 2011-04-23 MED ORDER — RIVAROXABAN 10 MG PO TABS
10.0000 mg | ORAL_TABLET | Freq: Every day | ORAL | Status: DC
  Administered 2011-04-23 – 2011-04-25 (×3): 10 mg via ORAL
  Filled 2011-04-23 (×2): qty 1

## 2011-04-23 MED ORDER — ACETAMINOPHEN 10 MG/ML IV SOLN
1000.0000 mg | Freq: Four times a day (QID) | INTRAVENOUS | Status: AC
  Administered 2011-04-23 – 2011-04-24 (×4): 1000 mg via INTRAVENOUS
  Filled 2011-04-23 (×5): qty 100

## 2011-04-23 NOTE — Progress Notes (Signed)
Clinical Social Work-CSW spoke with PA who relayed that pt and wife may not feel comfortable with d/c home- CSW will touch base with wife in the AM and pursue placement PRN-[name] [name]-MSW, [phone]

## 2011-04-23 NOTE — Progress Notes (Signed)
Patient ID: [name] [name], male   DOB: 06-17-1940, 71 y.o.   MRN: [id number] PATIENT ID:      [name] [name]  MRN:     [id number] DOB/AGE:    31-Aug-1940 / 71 y.o.    PROGRESS NOTE Subjective:  negative for Chest Pain  negative for Shortness of Breath  negative for Nausea/Vomiting   negative for Calf Pain  negative for Bowel Movement   Tolerating Diet: yes         Patient reports pain as 3 on 0-10 scale.    Objective: Vital signs in last 24 hours:   Patient Vitals for the past 24 hrs:  BP Temp Temp src Pulse Resp SpO2  04/23/11 0634 117/52 mmHg 97.8 F (36.6 C) - 68  20  95 %  04/23/11 0226 104/49 mmHg 98.1 F (36.7 C) - 65  18  95 %  04/22/11 2147 118/54 mmHg 97.5 F (36.4 C) - 68  20  96 %  04/22/11 1800 122/68 mmHg 98.9 F (37.2 C) Oral 65  18  93 %  04/22/11 1600 - - - - 18  95 %  04/22/11 1507 136/74 mmHg 97.5 F (36.4 C) Oral 68  18  95 %  04/22/11 1435 167/81 mmHg 98.3 F (36.8 C) Oral 71  16  96 %  04/22/11 1415 124/60 mmHg 98.5 F (36.9 C) - 68  15  99 %  04/22/11 1400 131/53 mmHg - - 68  15  99 %  04/22/11 1345 136/62 mmHg - - 68  17  98 %  04/22/11 1330 139/63 mmHg - - 70  16  100 %  04/22/11 1315 141/61 mmHg - - 74  14  98 %  04/22/11 1305 - 98.2 F (36.8 C) - - - -  04/22/11 0825 148/78 mmHg 98 F (36.7 C) Oral 65  18  94 %      Intake/Output from previous day:   02/12 0701 - 02/13 0700 In: 2750 [P.O.:480; I.V.:1700] Out: 2185 [Urine:1450; Drains:725]   Intake/Output this shift:       Intake/Output      02/12 0701 - 02/13 0700 02/13 0701 - 02/14 0700   P.O. 480    I.V. 1700    Other 15    IV Piggyback 555    Total Intake 2750    Urine 1450    Drains 725    Blood 10    Total Output 2185    Net +565            LABORATORY DATA:  Basename 04/23/11 0515  WBC 7.0  HGB 10.9*  HCT 32.1*  PLT 132*    Basename 04/23/11 0515  NA 140  K 3.6  CL 103  CO2 31  BUN 12  CREATININE 1.07  GLUCOSE 123*  CALCIUM 8.4   Lab Results    Component Value Date   INR 1.08 04/11/2011   INR 1.1 09/29/2008   INR 1.0 09/29/2008    Examination:  General appearance: alert, cooperative and mild distress Resp: clear to auscultation bilaterally Cardio: regular rate and rhythm GI: normal findings: bowel sounds normal  Wound Exam: clean, dry, intact dressing  Drainage:  None  Motor Exam EHL, FHL, Anterior Tibial and Posterior Tibial Intact  Sensory Exam Superficial Peroneal, Deep Peroneal and Tibial normal  Assessment:    1 Day Post-Op  Procedure(s) (LRB): TOTAL KNEE ARTHROPLASTY (Right)  ADDITIONAL DIAGNOSIS:  Principal Problem:  *Osteoarthritis of knee Active Problems:  Obesity,  Class II, BMI 35-39.9, with comorbidity  Hypertension  Prostate carcinoma  Melanoma  Hiatal hernia  Asthma  Acute Blood Loss Anemia   Plan: Physical Therapy as ordered Partial Weight Bearing @ 25% (PWB)  DVT Prophylaxis:  Xarelto, Foot Pumps and TED hose  DISCHARGE PLAN: Skilled Nursing Facility/Rehab  DISCHARGE NEEDS: Walker and 3-in-1 comode seat  Wean off O2, keeping sats>92% Saline lock IV          Dalonda Simoni 04/23/2011, 8:14 AM

## 2011-04-23 NOTE — Progress Notes (Signed)
Clinical Social Work-CSW received referral for [facility]. CSW reviewed chart, met with RNCM-pt plan to d/c home with home health-No CSW needs at this time-please re consult if needs arise. Doyce Stonehouse-MSW, [phone]  

## 2011-04-23 NOTE — Progress Notes (Signed)
Physical Therapy Treatment Patient Details Name: [name] [name] MRN: [id number] DOB: December 06, 1940 Today's Date: 04/23/2011  PT Assessment/Plan  PT - Assessment/Plan Comments on Treatment Session: Patient contiues to progress with ambulation. Very motivated PT Plan: Discharge plan remains appropriate PT Frequency: 7X/week Recommendations for Other Services: OT consult Follow Up Recommendations: Home health PT;Supervision - Intermittent Equipment Recommended: None recommended by PT PT Goals  Acute Rehab PT Goals PT Goal Formulation: With patient Time For Goal Achievement: 7 days Pt will go Sit to Stand: with modified independence PT Goal: Sit to Stand - Progress: Progressing toward goal Pt will go Stand to Sit: with modified independence PT Goal: Stand to Sit - Progress: Progressing toward goal Pt will Ambulate: >150 feet;with modified independence;with rolling walker PT Goal: Ambulate - Progress: Progressing toward goal Pt will Go Up / Down Stairs: 1-2 stairs;with modified independence;with rolling walker PT Goal: Up/Down Stairs - Progress: Goal set today Pt will Perform Home Exercise Program: Independently PT Goal: Perform Home Exercise Program - Progress: Progressing toward goal  PT Treatment Precautions/Restrictions  Precautions Precautions: Knee Restrictions Weight Bearing Restrictions: Yes RLE Weight Bearing: Partial weight bearing RLE Partial Weight Bearing Percentage or Pounds: 50% Other Position/Activity Restrictions: Noted in progress notes, WBing status PWB 25%; proceeded with 25% PWB during eval, as this was more conservative Mobility (including Balance) Transfers Transfers: Yes Sit to Stand: From chair/3-in-1;4: Min assist;With upper extremity assist;With armrests Sit to Stand Details (indicate cue type and reason): Cues for safe technique and A for safety Stand to Sit: 4: Min assist;With upper extremity assist;With armrests;To chair/3-in-1 Stand to Sit Details:  Cues for sit slowly Ambulation/Gait Ambulation/Gait: Yes Ambulation/Gait Assistance: 4: Min assist Ambulation/Gait Assistance Details (indicate cue type and reason): A for safety. Cues for PWB and for posture Ambulation Distance (Feet): 130 Feet Assistive device: Rolling walker Gait Pattern: Step-to pattern;Decreased stride length    Exercise  Total Joint Exercises Ankle Circles/Pumps: AROM;Both;5 reps;Supine Quad Sets: AROM;Right;10 reps Heel Slides: AAROM;Right;10 reps Straight Leg Raises: AAROM;Right;10 reps End of Session PT - End of Session Equipment Utilized During Treatment: Gait belt Activity Tolerance: Patient tolerated treatment well Patient left: in chair General Behavior During Session: [facility] for tasks performed Cognition: [facility] for tasks performed  Fredrich Birks 04/23/2011, 12:48 PM 04/23/2011 Fredrich Birks PTA [phone] pager [phone] office

## 2011-04-23 NOTE — Progress Notes (Signed)
Occupational Therapy Evaluation Patient Details Name: [name] [name] MRN: [id number] DOB: September 02, 1940 Today's Date: 04/23/2011  Problem List:  Patient Active Problem List  Diagnoses  . Osteoarthritis of knee  . Hypertension  . Prostate carcinoma  . Melanoma  . Hiatal hernia  . Asthma  . Obesity, Class II, BMI 35-39.9, with comorbidity    Past Medical History:  Past Medical History  Diagnosis Date  . Hypertension   . Dysrhythmia     palpitations, followed by Dr.Ehinger, seen in prep for surgery on 03/27/2011   . Angina     chest pain- cardiac cath. followed in 2010, record available ,  told then that he should f/u /w Dr. Manus Gunning    . Asthma     uses  singulair  . Pneumonia     hosp. 20 yrs. ago  . Sleep apnea     study done, 10 yrs. ago, told that he needed  CPAP but  never used   . Hepatitis     jaundice- many yrs. ago  . Chronic kidney disease     prostate cancer - surg. removal- 1996  . Cancer     prostate, melanoma- 2010, excision    . Arthritis     R knee, back     Past Surgical History:  Past Surgical History  Procedure Date  . Cardiac catheterization     2010  . Tonsillectomy     as child  . Fracture surgery     L wrist, hardware- 1982  . Joint replacement     L knee, 2009  . Prostatectomy     for ca  . Total knee arthroplasty 04/22/2011    Procedure: TOTAL KNEE ARTHROPLASTY;  Surgeon: Valeria Batman, MD;  Location: [facility] OR;  Service: Orthopedics;  Laterality: Right;    OT Assessment/Plan/Recommendation OT Assessment Clinical Impression Statement: 70 yo male sp R TKA. All education regarding ADL with [employer] D[employer] AE and DME completed. Pt return demonstrated or verbally demonstrated understanding. No further OT needed. OT signing off. Feel pt will be appropriate for D/C home with [facility] services. OT Recommendation/Assessment: Patient does not need any further OT services OT Recommendation Follow Up Recommendations: No OT follow up Equipment Recommended: None  recommended by OT OT Goals Acute Rehab OT Goals OT Goal Formulation:  (eval only)  OT Evaluation Precautions/Restrictions  Precautions Precautions: Knee Restrictions Weight Bearing Restrictions: Yes RLE Weight Bearing: Partial weight bearing RLE Partial Weight Bearing Percentage or Pounds: 50% Prior Functioning Home Living Lives With: Spouse Receives Help From: Family Type of Home: House Home Layout: One level Home Access: Stairs to enter Entrance Stairs-Rails: None Secretary/administrator of Steps: 1 Bathroom Shower/Tub: Health visitor: Handicapped height Bathroom Accessibility: Yes How Accessible: Accessible via walker Home Adaptive Equipment: Walker - rolling;Bedside commode/3-in-1 Additional Comments: Pt has all nec DME Prior Function Level of Independence: Independent with basic ADLs;Independent with homemaking with wheelchair Able to Take Stairs?: Yes Driving: Yes Vocation: Retired ADL ADL Eating/Feeding: Performed;Independent Where Assessed - Eating/Feeding: Edge of bed Grooming: Simulated;Set up Where Assessed - Grooming: Sitting, bed;Unsupported Upper Body Bathing: Simulated;Modified independent Where Assessed - Upper Body Bathing: Sitting, bed Lower Body Bathing: Simulated;Minimal assistance Where Assessed - Lower Body Bathing: Sitting, bed;Unsupported Upper Body Dressing: Simulated;Set up Where Assessed - Upper Body Dressing: Sitting, bed Lower Body Dressing: Simulated;Minimal assistance Where Assessed - Lower Body Dressing: Sitting, bed;Unsupported Toilet Transfer: Simulated;Supervision/safety Toilet Transfer Method: Proofreader: Bedside commode Toileting - Clothing Manipulation: Simulated;Modified independent Toileting -  Hygiene: Simulated;Independent Where Assessed - Toileting Hygiene: Standing Tub/Shower Transfer: Not assessed (Discussed technique) Tub/Shower Transfer Method: Not assessed Equipment Used:  Rolling walker;Reacher;Sock aid;Long-handled sponge Ambulation Related to ADLs: supervision ADL Comments: A for LB ADL, Reviewed available AE to use if needed. Vision/Perception    Cognition Cognition Arousal/Alertness: Awake/alert Overall Cognitive Status: Appears within functional limits for tasks assessed Orientation Level: Oriented X4 Sensation/Coordination Coordination Gross Motor Movements are Fluid and Coordinated: Yes Fine Motor Movements are Fluid and Coordinated: Yes Extremity Assessment RUE Assessment RUE Assessment: Within Functional Limits LUE Assessment LUE Assessment: Within Functional Limits Mobility  Bed Mobility Bed Mobility: Yes Supine to Sit: 5: Supervision;HOB flat Supine to Sit Details (indicate cue type and reason):  (sit - supine with S) Transfers Transfers: Yes Sit to Stand: From bed;With upper extremity assist;5: Supervision Sit to Stand Details (indicate cue type and reason): Cues for safe technique and A for safety Stand to Sit: 5: Supervision;With upper extremity assist;To bed;To elevated surface Stand to Sit Details: Cues for sit slowly  End of Session OT - End of Session Equipment Utilized During Treatment: Gait belt Activity Tolerance: Patient tolerated treatment well Patient left: in bed;with call bell in reach;with family/visitor present General Behavior During Session: [facility] for tasks performed Cognition: [facility] for tasks performed   [facility] 04/23/2011, 4:21 PM  [facility], OTR/L  [phone] 04/23/2011

## 2011-04-23 NOTE — Op Note (Signed)
[name] [name], [name] [name]             ACCOUNT NO.:  [id number]  MEDICAL RECORD NO.:  [id number]  LOCATION:  5020                         FACILITY:  [facility]  PHYSICIAN:  Claude Manges. Daiya Tamer, M.D.DATE OF BIRTH:  01/03/1941  DATE OF PROCEDURE:  04/22/2011 DATE OF DISCHARGE:                              OPERATIVE REPORT   PREOPERATIVE DIAGNOSIS:  End-stage osteoarthritis, right knee.  POSTOPERATIVE DIAGNOSIS:  End-stage osteoarthritis, right knee.  PROCEDURE:  Right total knee replacement.  SURGEON:  Claude Manges. Cleophas Dunker, MD  ASSISTANT:  Oris Drone. Petrarca, PA-C  ANESTHESIA:  General with supplemental femoral nerve block.  COMPLICATIONS:  None.  COMPONENTS:  DePuy LCS standard plus femoral component #5 keeled tibial tray with a 10-mm bridging bearing, 3-peg metal back rotating patella. Components were secured with polymethyl methacrylate.  PROCEDURE:  [name] [name] was met in the holding area, identified the right knee as the appropriate operative site.  He did receive a preoperative femoral nerve block.  The patient was transported to room #1, placed under general orotracheal anesthesia without difficulty.  Nursing staff inserted a Foley catheter. Urine was clear.  The tourniquet was applied to the right lower extremity, the leg was then prepped with Betadine scrub and DuraPrep from the tourniquet to the ankle.  Sterile draping was performed.  With the extremity elevated, was Esmarch exsanguinated with a proximal tourniquet at 350 mmHg.  A midline longitudinal incision was made, centered in the midline extending from the superior pouch to the tibial tubercle.  Via sharp dissection, the incision was carried down to subcutaneous tissue.  Small bleeders were Bovie coagulated.  The first layer of capsule was incised in the midline and medial parapatellar incision was then made through the deep capsule, there was minimal clear yellow joint effusion. Patella was everted to 180 degrees  laterally and the knee flexed at 90 degrees.  There was a mild amount of beefy red synovitis.  There was considerable chondromalacia involving the medial and lateral femoral condyle with large osteophytes along the medial and lateral femoral condyles.  There was no fixed deformity.  Synovectomy was performed. Osteophytes were removed from around the tibia and the femur.  I then measured a standard plus femoral component.  Initial bony cut was then made transversely along the proximal tibia with a 7 degrees angle of declination.  Subsequent cuts were then made on the femur with 10-mm flexion-extension gaps perfectly symmetrical. MCL and LCL remained intact.  Lamina spreaders were then inserted.  I removed the medial and lateral menisci as well as ACL and PCL. Osteophytes were removed from the posterior femoral condyles.  It was a Engineer, production cyst that was decompressed medially and then bovied.  Final cuts were then made for tapering on the femur.  At each bony cut, we checked our alignment, felt that it was perfectly anatomic.  Retractors were then placed around the tibia, it was advanced anteriorly.  We trialed the #5 tibial tray.  The center hole was made followed by the keeled cut with a trial tibial jig in place.  The 10-mm bridging bearing was then inserted followed by the trial standard plus femoral component.  Through a full range of motion, we had  no instability with excellent alignment of the polyethylene component.  The patella was then prepared by removing 10 mm of patellar thickness leaving 14 mm of patellar thickness.  The trial patella was then applied.  There was some mild subluxation, so a lateral release was then performed with the Bovie through the deep capsule, which stabilized the patella.  The trial components were then removed.  The joint was copiously irrigated with saline solution.  The final components were then impacted with polymethyl methacrylate.  We  initially applied the tibial tray followed by the bridging bearing and then the femoral component.  With the knee in extension and compression, extraneous methacrylate was removed around the periphery of the components.  Patella was applied with methacrylate and patellar clamp.  At approximately 15 minutes, the methacrylate had hardened.  The joint was then explored without evidence of any further extraneous methacrylate.  The deep capsule was infiltrated with 0.25% Marcaine with epinephrine.  The tourniquet was deflated at approximately 72 minutes with excellent capillary refill to the joint surface.  Gross bleeders were Bovie coagulated.  Medium-sized Hemovac was inserted.  The deep capsule was closed with interrupted #1 Ethibond, superficial capsule was closed with running 0 Vicryl, subcu with 2-0 Vicryl and 3-0 Monocryl, skin closed with skin clips.  Sterile bulky dressing was applied followed by the patient's support stocking.  The patient tolerated the procedure without complications.     Claude Manges. Cleophas Dunker, M.D.     PWW/MEDQ  D:  04/22/2011  T:  04/23/2011  Job:  [id number]

## 2011-04-23 NOTE — Progress Notes (Signed)
Physical Therapy Evaluation Patient Details Name: [name] [name] MRN: [id number] DOB: 03-18-1940 Today's Date: 04/23/2011  Problem List:  Patient Active Problem List  Diagnoses  . Osteoarthritis of knee  . Hypertension  . Prostate carcinoma  . Melanoma  . Hiatal hernia  . Asthma  . Obesity, Class II, BMI 35-39.9, with comorbidity    Past Medical History:  Past Medical History  Diagnosis Date  . Hypertension   . Dysrhythmia     palpitations, followed by Dr.Ehinger, seen in prep for surgery on 03/27/2011   . Angina     chest pain- cardiac cath. followed in 2010, record available ,  told then that he should f/u /w Dr. Manus Gunning    . Asthma     uses  singulair  . Pneumonia     hosp. 20 yrs. ago  . Sleep apnea     study done, 10 yrs. ago, told that he needed  CPAP but  never used   . Hepatitis     jaundice- many yrs. ago  . Chronic kidney disease     prostate cancer - surg. removal- 1996  . Cancer     prostate, melanoma- 2010, excision    . Arthritis     R knee, back     Past Surgical History:  Past Surgical History  Procedure Date  . Cardiac catheterization     2010  . Tonsillectomy     as child  . Fracture surgery     L wrist, hardware- 1982  . Joint replacement     L knee, 2009  . Prostatectomy     for ca  . Total knee arthroplasty 04/22/2011    Procedure: TOTAL KNEE ARTHROPLASTY;  Surgeon: Valeria Batman, MD;  Location: [facility] OR;  Service: Orthopedics;  Laterality: Right;    PT Assessment/Plan/Recommendation PT Assessment Clinical Impression Statement: 71 yo male s/p RTKA presents with decr ffunctional mobility and impairments listed below; will benefit from PT to maximize independence and safety with mobility to enable safe dc home PT Recommendation/Assessment: Patient will need skilled PT in the acute care venue PT Problem List: Decreased strength;Decreased range of motion;Decreased activity tolerance;Decreased balance;Decreased mobility;Decreased knowledge  of use of DME;Pain;Decreased knowledge of precautions PT Therapy Diagnosis : Difficulty walking;Acute pain PT Plan PT Frequency: 7X/week PT Treatment/Interventions: DME instruction;Gait training;Stair training;Functional mobility training;Therapeutic activities;Therapeutic exercise;Patient/family education PT Recommendation Recommendations for Other Services: OT consult Follow Up Recommendations: Home health PT;Supervision - Intermittent Equipment Recommended: None recommended by PT PT Goals  Acute Rehab PT Goals PT Goal Formulation: With patient Time For Goal Achievement: 7 days Pt will go Supine/Side to Sit: with modified independence PT Goal: Supine/Side to Sit - Progress: Goal set today Pt will go Sit to Supine/Side: with modified independence PT Goal: Sit to Supine/Side - Progress: Goal set today Pt will go Sit to Stand: with modified independence PT Goal: Sit to Stand - Progress: Goal set today Pt will go Stand to Sit: with modified independence PT Goal: Stand to Sit - Progress: Goal set today Pt will Ambulate: >150 feet;with modified independence;with rolling walker PT Goal: Ambulate - Progress: Goal set today Pt will Go Up / Down Stairs: 1-2 stairs;with modified independence;with rolling walker PT Goal: Up/Down Stairs - Progress: Goal set today Pt will Perform Home Exercise Program: Independently PT Goal: Perform Home Exercise Program - Progress: Goal set today  PT Evaluation Precautions/Restrictions  Precautions Precautions: Knee Restrictions Weight Bearing Restrictions: Yes RLE Weight Bearing: Partial weight bearing RLE Partial Weight  Bearing Percentage or Pounds: 50 Other Position/Activity Restrictions: Noted in progress notes, WBing status PWB 25%; proceeded with 25% PWB during eval, as this was more conservative Prior Functioning  Home Living Lives With: Spouse Receives Help From: Family Type of Home: House Home Layout: One level Home Access: Stairs to  enter Entrance Stairs-Rails: None Entrance Stairs-Number of Steps: 1 Bathroom Shower/Tub: Naval architect Equipment: Walker - rolling;Bedside commode/3-in-1 Prior Function Level of Independence: Independent with homemaking with ambulation Cognition Cognition Arousal/Alertness: Awake/alert Overall Cognitive Status: Appears within functional limits for tasks assessed Orientation Level: Oriented X4 Sensation/Coordination Coordination Gross Motor Movements are Fluid and Coordinated: Yes Fine Motor Movements are Fluid and Coordinated: Yes Extremity Assessment RUE Assessment RUE Assessment: Within Functional Limits LUE Assessment LUE Assessment: Within Functional Limits RLE Assessment RLE Assessment: Exceptions to [facility] RLE Strength RLE Overall Strength Comments: Grossly decr AROM and strength, limited by pain postop; quad activation present, but weak LLE Assessment LLE Assessment: Within Functional Limits Mobility (including Balance) Bed Mobility Bed Mobility: Yes Supine to Sit: 3: Mod assist;HOB flat;With rails Supine to Sit Details (indicate cue type and reason): cues for technique and safety Transfers Transfers: Yes Sit to Stand: 3: Mod assist;With upper extremity assist;From bed Sit to Stand Details (indicate cue type and reason): cues for PWB and safety Stand to Sit: 3: Mod assist;To chair/3-in-1;With upper extremity assist Stand to Sit Details: cues for hand placement, safety, control; physical assist to extend RLE anteriorly for comfort and better control with sitting Ambulation/Gait Ambulation/Gait: Yes Ambulation/Gait Assistance: 4: Min assist (second person for chair behind) Ambulation/Gait Assistance Details (indicate cue type and reason): verbal and demo cues for sequence and PWB Ambulation Distance (Feet): 70 Feet Assistive device: Rolling walker Gait Pattern: Step-to pattern    Exercise  Total Joint Exercises Ankle Circles/Pumps: AROM;Both;5  reps;Supine Quad Sets: AROM;Right;10 reps;Supine Heel Slides: AAROM;Right;5 reps;Supine End of Session PT - End of Session Equipment Utilized During Treatment: Gait belt Activity Tolerance: Patient tolerated treatment well Patient left: in chair;with call bell in reach General Behavior During Session: [facility] for tasks performed Cognition: [facility] for tasks performed  Van Clines [facility] 04/23/2011, 11:47 AM

## 2011-04-24 DIAGNOSIS — D62 Acute posthemorrhagic anemia: Secondary | ICD-10-CM | POA: Diagnosis not present

## 2011-04-24 LAB — BASIC METABOLIC PANEL
BUN: 10 mg/dL (ref 6–23)
Chloride: 105 mEq/L (ref 96–112)
Creatinine, Ser: 0.96 mg/dL (ref 0.50–1.35)
GFR calc Af Amer: >90 mL/min (ref >90)
Glucose, Bld: 125 mg/dL — ABNORMAL HIGH (ref 70–99)

## 2011-04-24 LAB — GLUCOSE, CAPILLARY
Glucose-Capillary: 142 mg/dL — ABNORMAL HIGH (ref 70–99)
Glucose-Capillary: 130 mg/dL — ABNORMAL HIGH (ref 70–99)
Glucose-Capillary: 119 mg/dL — ABNORMAL HIGH (ref 70–99)

## 2011-04-24 LAB — CBC
HCT: 28.3 % — ABNORMAL LOW (ref 39.0–52.0)
Hemoglobin: 9.6 g/dL — ABNORMAL LOW (ref 13.0–17.0)
MCHC: 33.9 g/dL (ref 30.0–36.0)
MCV: 88.7 fL (ref 78.0–100.0)
RDW: 12.9 % (ref 11.5–15.5)
WBC: 8.1 10*3/uL (ref 4.0–10.5)

## 2011-04-24 MED ORDER — METHOCARBAMOL 500 MG PO TABS: 500.0000 mg | ORAL_TABLET | Freq: Four times a day (QID) | ORAL | Status: AC | PRN

## 2011-04-24 MED ORDER — POTASSIUM CHLORIDE CRYS ER 20 MEQ PO TBCR
40.0000 meq | EXTENDED_RELEASE_TABLET | Freq: Once | ORAL | Status: AC
  Administered 2011-04-24: 40 meq via ORAL
  Filled 2011-04-24: qty 2

## 2011-04-24 MED ORDER — OXYCODONE HCL 5 MG PO TABS: 5.0000 mg | ORAL_TABLET | ORAL | Status: AC | PRN

## 2011-04-24 MED ORDER — RIVAROXABAN 10 MG PO TABS: 10.0000 mg | ORAL_TABLET | Freq: Every day | ORAL | Status: DC

## 2011-04-24 NOTE — Progress Notes (Signed)
Physical Therapy Treatment Patient Details Name: [name] [name] MRN: [id number] DOB: [birth year] Today's Date: 04/24/2011  PT Assessment/Plan  PT - Assessment/Plan Comments on Treatment Session: Pt limited by pain this morning. Will attempt to increase ambulation this afternoon.  PT Plan: Discharge plan remains appropriate PT Frequency: 7X/week Recommendations for Other Services: OT consult Follow Up Recommendations: Home health PT;Supervision - Intermittent Equipment Recommended: None recommended by OT PT Goals  Acute Rehab PT Goals PT Goal: Supine/Side to Sit - Progress: Progressing toward goal PT Goal: Sit to Stand - Progress: Progressing toward goal PT Goal: Stand to Sit - Progress: Progressing toward goal PT Goal: Ambulate - Progress: Not progressing PT Goal: Perform Home Exercise Program - Progress: Progressing toward goal  PT Treatment Precautions/Restrictions  Precautions Precautions: Knee Restrictions Weight Bearing Restrictions: Yes RLE Weight Bearing: Partial weight bearing RLE Partial Weight Bearing Percentage or Pounds: 50% Other Position/Activity Restrictions: Noted in progress notes, WBing status PWB 25%; proceeded with 25% PWB during eval, as this was more conservative Mobility (including Balance) Bed Mobility Supine to Sit: 4: Min assist;HOB elevated (Comment degrees) (HOB 40degrees) Supine to Sit Details (indicate cue type and reason): A for R LE. Cues for safe postioning and technique Transfers Sit to Stand: 4: Min assist;From bed;From elevated surface;With upper extremity assist Sit to Stand Details (indicate cue type and reason): Cues for safe hand placement. A for balance and safety Stand to Sit: 4: Min assist;With upper extremity assist;With armrests;To chair/3-in-1 Stand to Sit Details: A to control descent into chair. Cues for LE positioning.  Ambulation/Gait Ambulation/Gait Assistance: 4: Min assist Ambulation/Gait Assistance Details (indicate cue  type and reason): A for balance and RW. Cues for posture.  Ambulation Distance (Feet): 10 Feet Assistive device: Rolling walker Gait Pattern: Step-to pattern;Decreased stride length;Trunk flexed    Exercise  Total Joint Exercises Ankle Circles/Pumps: AROM;Both;10 reps Quad Sets: AROM;Both;Right Heel Slides: AAROM;Right;Both Hip ABduction/ADduction: AAROM;Right;Both Straight Leg Raises: AAROM;Right;Both End of Session PT - End of Session Equipment Utilized During Treatment: Gait belt Activity Tolerance: Patient limited by pain Patient left: in chair General Behavior During Session: [facility] for tasks performed Cognition: [facility] for tasks performed  Deundra Furber, Adline Potter 04/24/2011, 9:59 AM 04/24/2011 Fredrich Birks PTA [phone] pager [phone] office

## 2011-04-24 NOTE — Progress Notes (Signed)
Clinical Social Work-CSW met with pt and wife at bedside. Pt is reluctant to d/c to SNF but is open in sharing that he is not recovering as quickly as he would like. Pt son works at  [employer];  pt relayed that he is willing to d/c to [facility] if there is a bed available and if they are in network with insurance. Pt relayed if he is not able to go to [facility] he will d/c home with [facility]. Wife is agreeable and comfortable with plan as she feels that any other facility is too far for family to travel.  CSW will contact [facility] regarding insurance, bed availabilty and initiate referral. FL2 will be placed in shadow chart and CSW will follow for d/c planning.-Casen Pryor-MSW, [phone]

## 2011-04-24 NOTE — Progress Notes (Signed)
Physical Therapy Treatment Patient Details Name: [name] [name] MRN: [id number] DOB: 1940-04-11 Today's Date: 04/24/2011  PT Assessment/Plan  PT - Assessment/Plan Comments on Treatment Session: Pt able to participate more today with therapy. Pt plans on DC home tomorrow. Will need to practice stairs prior to DC.  PT Plan: Discharge plan remains appropriate PT Frequency: 7X/week Recommendations for Other Services: OT consult Follow Up Recommendations: Home health PT;Supervision - Intermittent Equipment Recommended: None recommended by PT PT Goals  Acute Rehab PT Goals PT Goal: Supine/Side to Sit - Progress: Progressing toward goal PT Goal: Sit to Supine/Side - Progress: Progressing toward goal PT Goal: Sit to Stand - Progress: Progressing toward goal PT Goal: Stand to Sit - Progress: Progressing toward goal PT Goal: Ambulate - Progress: Progressing toward goal PT Goal: Perform Home Exercise Program - Progress: Progressing toward goal  PT Treatment Precautions/Restrictions  Precautions Precautions: Knee Restrictions Weight Bearing Restrictions: Yes RLE Weight Bearing: Partial weight bearing RLE Partial Weight Bearing Percentage or Pounds: 50% Other Position/Activity Restrictions: Noted in progress notes, WBing status PWB 25%; proceeded with 25% PWB during eval, as this was more conservative Mobility (including Balance) Bed Mobility Supine to Sit: 4: Min assist;With rails Supine to Sit Details (indicate cue type and reason): A for R LE. Cues for positioning Sit to Supine: 4: Min assist;HOB flat;With rail Sit to Supine - Details (indicate cue type and reason): A to lift R LE back into bed.  Transfers Sit to Stand: 4: Min assist;From bed Sit to Stand Details (indicate cue type and reason): Cues for safe technique Stand to Sit: 4: Min assist;Other (comment);To bed;With upper extremity assist Stand to Sit Details: A to control descent onto bed.  Ambulation/Gait Ambulation/Gait  Assistance: Other (comment);4: Min assist (MinGuard A) Ambulation/Gait Assistance Details (indicate cue type and reason): Cues for posture and safe management of Rw Ambulation Distance (Feet): 100 Feet Assistive device: Rolling walker Gait Pattern: Step-to pattern;Decreased stride length;Trunk flexed    Exercise  Total Joint Exercises Quad Sets: AROM;Right;15 reps Short Arc Quad: AAROM;10 reps;Right Heel Slides: Right;AAROM;10 reps Hip ABduction/ADduction: AAROM;Right;10 reps Straight Leg Raises: AAROM;Right;10 reps End of Session PT - End of Session Equipment Utilized During Treatment: Gait belt Activity Tolerance: Patient tolerated treatment well Patient left: in bed Nurse Communication: Mobility status for transfers;Mobility status for ambulation General Behavior During Session: [facility] for tasks performed Cognition: [facility] for tasks performed  Anitta Tenny, Adline Potter 04/24/2011, 3:15 PM 04/24/2011 Fredrich Birks PTA [phone] pager [phone] office

## 2011-04-24 NOTE — Discharge Summary (Signed)
PATIENT ID:      [name] [name]  MRN:     [id number] DOB/AGE:    12-03-1940 / 71 y.o.     DISCHARGE SUMMARY  ADMISSION DATE:    04/22/2011 DISCHARGE DATE:   04/25/2011   ADMISSION DIAGNOSIS: osteoarthritis right knee  (end stage osteoarthritis right knee)  DISCHARGE DIAGNOSIS:  end stage osteoarthritis right knee    ADDITIONAL DIAGNOSIS: Principal Problem:  *Osteoarthritis of knee Active Problems:  Obesity, Class II, BMI 35-39.9, with comorbidity  Hypertension  Prostate carcinoma  Melanoma  Hiatal hernia  Asthma  Postoperative anemia due to acute blood loss  Hypokalemia  Past Medical History  Diagnosis Date  . Hypertension   . Dysrhythmia     palpitations, followed by Dr.Ehinger, seen in prep for surgery on 03/27/2011   . Angina     chest pain- cardiac cath. followed in 2010, record available ,  told then that he should f/u /w Dr. Manus Gunning    . Asthma     uses  singulair  . Pneumonia     hosp. 20 yrs. ago  . Sleep apnea     study done, 10 yrs. ago, told that he needed  CPAP but  never used   . Hepatitis     jaundice- many yrs. ago  . Chronic kidney disease     prostate cancer - surg. removal- 1996  . Cancer     prostate, melanoma- 2010, excision    . Arthritis     R knee, back      PROCEDURE: Procedure(s): RIGHT TOTAL KNEE ARTHROPLASTY on 04/22/2011  CONSULTS:   none  HISTORY: [name] is a very pleasant 71 year old white married male who is seen back today for evaluation of his right knee. He has previously had a total knee replacement on the left in 2008 by Dr. Cleophas Dunker. He did well overall. He is now to the point though he is having problems with his right knee. He states that his pain is a combination of a sharp, dull, stabbing, throbbing, aching and burning pain in the right knee. It's a constant, dull, aching and burning pain. He's noticed weakness secondary to the fact he is unable to as much with the knee. He is losing motion. He is having problems in  that he feels as if he is going to fall secondary to the arthritis in the knee. It is worsening and does wake him at nighttime. Movement and exercise worsens his symptoms. He's tried Aleve intermittently with only minimal help. He's having difficulty with activities of daily living and this has impacted his life significantly. Admitted for Right TKR.   HOSPITAL COURSE:  HOA DERISO is a 71 y.o. admitted on 04/22/2011 and found to have a diagnosis of end stage osteoarthritis right knee.  After appropriate laboratory studies were obtained  they were taken to the operating room on 04/22/2011 and underwent Procedure(s): TOTAL KNEE ARTHROPLASTY.(RIGHT)    They were given perioperative antibiotics:  Anti-infectives     Start     Dose/Rate Route Frequency Ordered Stop   04/22/11 1600   ceFAZolin (ANCEF) IVPB 2 g/50 mL premix        2 g 100 mL/hr over 30 Minutes Intravenous Every 6 hours 04/22/11 1450 04/23/11 0447   04/21/11 1500   ceFAZolin (ANCEF) IVPB 2 g/50 mL premix        2 g 100 mL/hr over 30 Minutes Intravenous 60 min pre-op 04/21/11 1447 04/22/11 1049        .  Blood products given:none  Tolerated the procedure well. Foley placed intraoperatively. Given Ofirmev preoperatively as well as Antibiotics listed above.  Placed on CPM 0-60 degrees in PACU.  Toradol given X 2 doses.  On POD#1, the foley was discontinued and the IV saline locked.  PCA was discontinued.  O2 was weaned off the patient.  PT was begun as per protocol.  On POD#2, the dressing was changed. Hemovac pulled.   PT was continued. KCL given for mild hypokalemia  On POD #3, he was doing well.  Did well with PT yesterday.  The remainder of the hospital course was dedicated to ambulation and strengthening.   The patient was discharged on 3 Days Post-Op in  Stable condition.   DIAGNOSTIC STUDIES: Recent vital signs:  Patient Vitals for the past 24 hrs:  BP Temp Pulse Resp SpO2  04/25/11 0528 133/60 mmHg 98.7 F  (37.1 C) 72  18  94 %  May 15, 2011 2213 132/48 mmHg 99.6 F (37.6 C) 73  18  94 %  05-15-2011 1255 131/51 mmHg 98.5 F (36.9 C) 70  18  94 %       Recent laboratory studies:  Basename 04/25/11 0700 May 15, 2011 0540 04/23/11 0515  WBC 8.8 8.1 7.0  HGB 10.1* 9.6* 10.9*  HCT 29.5* 28.3* 32.1*  PLT 154 132* 132*    Basename 04/25/11 0700 May 15, 2011 0540 04/23/11 0515  NA 137 138 140  K 4.5 3.4* 3.6  CL 101 105 103  CO2 26 29 31   BUN 9 10 12   CREATININE 0.98 0.96 1.07  GLUCOSE 124* 125* 123*  CALCIUM 8.3* 8.3* 8.4   Lab Results  Component Value Date   INR 1.08 04/11/2011   INR 1.1 09/29/2008   INR 1.0 09/29/2008     Recent Radiographic Studies :   Chest 2 View  04/11/2011  *RADIOLOGY REPORT*  Clinical Data: Preoperative chest radiograph for right total knee arthroplasty.  CHEST - 2 VIEW  Comparison: 09/29/2008.  Findings: Left basilar linear atelectasis and / or scarring. Enlargement retrosternal clear space and hyper inflation with flattening of the hemidiaphragms compatible with emphysema. Cardiopericardial silhouette and mediastinal contours are within normal limits.  Trachea midline.  IMPRESSION: Left basilar atelectasis or scarring and emphysema.  No acute cardiopulmonary disease.  Original Report Authenticated By: Andreas Newport, M.D.    DISCHARGE INSTRUCTIONS: Discharge Orders    Future Orders Please Complete By Expires   Diet general      Call MD / Call 911      Comments:   If you experience chest pain or shortness of breath, CALL 911 and be transported to the hospital emergency room.  If you develope a fever above 101 F, pus (white drainage) or increased drainage or redness at the wound, or calf pain, call your surgeon's office.   Constipation Prevention      Comments:   Drink plenty of fluids.  Prune juice may be helpful.  You may use a stool softener, such as Colace (over the counter) 100 mg twice a day.  Use MiraLax (over the counter) for constipation as needed.   Increase  activity slowly as tolerated      Weight Bearing as taught in Physical Therapy      Comments:   Use a walker or crutches as instructed in Physical Therapy (PT) 50% weight bearing   Patient may shower      Comments:   You may shower without a dressing once there is no drainage.  Do not  wash over the wound.  If drainage remains, cover wound with plastic wrap and then shower.   Driving restrictions      Comments:   No driving for 6 weeks   Lifting restrictions      Comments:   No lifting for 6 weeks   CPM      Comments:   Continuous passive motion machine (CPM):      Use the CPM from 0 to 70 for 8 hours per day.      You may increase by 5 per day.  You may break it up into 2 or 3 sessions per day.      Use CPM for 3 weeks or until you are told to stop.   TED hose      Comments:   Use stockings (TED hose) for 3 weeks on right leg(s).  You may remove them at night for sleeping.   Change dressing      Comments:   Change dressing on Sunday, then change the dressing daily with sterile 4 x 4 inch gauze dressing and apply TED hose.  You may clean the incision with alcohol prior to redressing.   Do not put a pillow under the knee. Place it under the heel.         DISCHARGE MEDICATIONS:   Medication List  As of 04/25/2011  8:15 AM   STOP taking these medications         Fish Oil 1200 MG Caps      mulitivitamin with minerals Tabs      naproxen sodium 220 MG tablet         TAKE these medications         AZOR 5-20 MG per tablet   Generic drug: amLODipine-olmesartan   Take 1 tablet by mouth every morning.      Calcium + D 600-200 MG-UNIT Tabs   Generic drug: Calcium Carbonate-Vitamin D   Take 1 tablet by mouth 2 (two) times daily.      fluticasone 50 MCG/ACT nasal spray   Commonly known as: FLONASE   Place 2 sprays into the nose at bedtime.      hydroxypropyl methylcellulose 2.5 % ophthalmic solution   Commonly known as: ISOPTO TEARS   Place 1 drop into both eyes 3 (three)  times daily as needed. For dry eyes      methocarbamol 500 MG tablet   Commonly known as: ROBAXIN   Take 1 tablet (500 mg total) by mouth every 6 (six) hours as needed (spasms).      montelukast 10 MG tablet   Commonly known as: SINGULAIR   Take 10 mg by mouth daily before breakfast.      nebivolol 5 MG tablet   Commonly known as: BYSTOLIC   Take 5 mg by mouth every morning.      omeprazole 20 MG capsule   Commonly known as: PRILOSEC   Take 40 mg by mouth daily at 2 PM daily at 2 PM.      oxyCODONE 5 MG immediate release tablet   Commonly known as: Oxy IR/ROXICODONE   Take 1-2 tablets (5-10 mg total) by mouth every 4 (four) hours as needed.      rivaroxaban 10 MG Tabs tablet   Commonly known as: XARELTO   Take 1 tablet (10 mg total) by mouth daily with supper.            FOLLOW UP VISIT:   Follow-up Information    Follow up  with Rhondalyn Clingan, PA on 05/07/2011. (As scheduled)    Contact information:   [address] [place] [address] [phone]          DISPOSITION:  Home    CONDITION:  Stable   Geovanie Winnett 04/25/2011, 8:15 AM

## 2011-04-24 NOTE — Progress Notes (Signed)
Patient ID: [name] [name], male   DOB: [birth year], 71 y.o.   MRN: [id number] PATIENT ID:      [name] [name]  MRN:     [id number] DOB/AGE:    1940/12/02 / 71 y.o.    PROGRESS NOTE Subjective:  negative for Chest Pain  negative for Shortness of Breath  negative for Nausea/Vomiting   negative for Calf Pain  negative for Bowel Movement   Tolerating Diet: yes         Patient reports pain as 2 on 0-10 scale.    Objective: Vital signs in last 24 hours:  Patient Vitals for the past 24 hrs:  BP Temp Pulse Resp SpO2 Height Weight  04/24/11 0459 141/58 mmHg 98.4 F (36.9 C) 71  18  96 % - -  04/23/11 2030 143/59 mmHg 99.8 F (37.7 C) 71  18  93 % - -  04/23/11 1400 122/80 mmHg 97 F (36.1 C) 100  18  96 % - -  04/23/11 1300 - - - - - 5\' 7"  (1.702 m) 111.857 kg (246 lb 9.6 oz)      Intake/Output from previous day:   02/13 0701 - 02/14 0700 In: 865 [P.O.:565] Out: 650 [Urine:400; Drains:250]   Intake/Output this shift:   02/14 0701 - 02/14 1900 In: 360 [P.O.:360] Out: -    Intake/Output      02/13 0701 - 02/14 0700 02/14 0701 - 02/15 0700   P.O. 565 360   I.V. (mL/kg)     Other     IV Piggyback 300    Total Intake(mL/kg) 865 (7.7) 360 (3.2)   Urine (mL/kg/hr) 400 (0.1)    Drains 250    Blood     Total Output 650    Net +215 +360           LABORATORY DATA:  Basename 04/24/11 0540 04/23/11 0515  WBC 8.1 7.0  HGB 9.6* 10.9*  HCT 28.3* 32.1*  PLT 132* 132*    Basename 04/24/11 0540 04/23/11 0515  NA 138 140  K 3.4* 3.6  CL 105 103  CO2 29 31  BUN 10 12  CREATININE 0.96 1.07  GLUCOSE 125* 123*  CALCIUM 8.3* 8.4   Lab Results  Component Value Date   INR 1.08 04/11/2011   INR 1.1 09/29/2008   INR 1.0 09/29/2008    Examination:  General appearance: alert, cooperative, mild distress and moderately obese Resp: clear to auscultation bilaterally Cardio: regular rate and rhythm GI: normal findings: bowel sounds normal  Wound Exam: clean, dry, intact    Drainage:  None: wound tissue dry  Motor Exam EHL, FHL, Anterior Tibial and Posterior Tibial Intact  Sensory Exam Superficial Peroneal, Deep Peroneal and Tibial normal  Good distal pulses in legs  Assessment:    2 Days Post-Op  Procedure(s) (LRB): TOTAL KNEE ARTHROPLASTY (Right)  ADDITIONAL DIAGNOSIS:  Principal Problem:  *Osteoarthritis of knee Active Problems:  Obesity, Class II, BMI 35-39.9, with comorbidity  Hypertension  Prostate carcinoma  Melanoma  Hiatal hernia  Asthma  Postoperative anemia due to acute blood loss  Acute Blood Loss Anemia and Hypokalemia   Plan: Physical Therapy as ordered Partial Weight Bearing @ 50% (PWB)  DVT Prophylaxis:  Xarelto  DISCHARGE PLAN: Home  DISCHARGE NEEDS: HHPT, CPM, Walker and 3-in-1 comode seat  Removed hemovac Dressing changed KCL ordered        Shaletta Hinostroza 04/24/2011, 12:51 PM

## 2011-04-25 DIAGNOSIS — E876 Hypokalemia: Secondary | ICD-10-CM | POA: Diagnosis not present

## 2011-04-25 LAB — BASIC METABOLIC PANEL
Chloride: 101 mEq/L (ref 96–112)
GFR calc Af Amer: >90 mL/min (ref >90)
GFR calc non Af Amer: 80 mL/min — ABNORMAL LOW (ref >90)
Potassium: 4.5 mEq/L (ref 3.5–5.1)
Sodium: 137 mEq/L (ref 135–145)

## 2011-04-25 LAB — CBC
HCT: 29.5 % — ABNORMAL LOW (ref 39.0–52.0)
MCHC: 34.2 g/dL (ref 30.0–36.0)
Platelets: 154 10*3/uL (ref 150–400)
RDW: 12.8 % (ref 11.5–15.5)
WBC: 8.8 10*3/uL (ref 4.0–10.5)

## 2011-04-25 NOTE — Progress Notes (Signed)
Patient ID: [name] [name], male   DOB: [birth year], 71 y.o.   MRN: [id number] PATIENT ID:      [name] [name]  MRN:     [id number] DOB/AGE:    01/02/1941 / 71 y.o.    PROGRESS NOTE Subjective:  negative for Chest Pain  negative for Shortness of Breath  negative for Nausea/Vomiting   negative for Calf Pain  negative for Bowel Movement   Tolerating Diet: yes         Patient reports pain as 1 on 0-10 scale.    Objective: Vital signs in last 24 hours:   Patient Vitals for the past 24 hrs:  BP Temp Pulse Resp SpO2  04/25/11 0528 133/60 mmHg 98.7 F (37.1 C) 72  18  94 %  04/24/11 2213 132/48 mmHg 99.6 F (37.6 C) 73  18  94 %  04/24/11 1255 131/51 mmHg 98.5 F (36.9 C) 70  18  94 %    @flow {1959:LAST@   Intake/Output from previous day:   02/14 0701 - 02/15 0700 In: 360 [P.O.:360] Out: 800 [Urine:800]   Intake/Output this shift:       Intake/Output      02/14 0701 - 02/15 0700 02/15 0701 - 02/16 0700   P.O. 360    IV Piggyback     Total Intake(mL/kg) 360 (3.2)    Urine (mL/kg/hr) 800 (0.3)    Drains     Total Output 800    Net -440            LABORATORY DATA:  Basename 04/25/11 0700 04/24/11 0540 04/23/11 0515  WBC 8.8 8.1 7.0  HGB 10.1* 9.6* 10.9*  HCT 29.5* 28.3* 32.1*  PLT 154 132* 132*    Basename 04/24/11 0540 04/23/11 0515  NA 138 140  K 3.4* 3.6  CL 105 103  CO2 29 31  BUN 10 12  CREATININE 0.96 1.07  GLUCOSE 125* 123*  CALCIUM 8.3* 8.4   Lab Results  Component Value Date   INR 1.08 04/11/2011   INR 1.1 09/29/2008   INR 1.0 09/29/2008    Examination:  General appearance: alert, cooperative and mild distress Resp: clear to auscultation bilaterally Cardio: regular rate and rhythm GI: normal findings: bowel sounds normal  Wound Exam: clean, dry, intact   Drainage:  None: wound tissue dry  Motor Exam: EHL, FHL, Anterior Tibial and Posterior Tibial Intact  Sensory Exam: Superficial Peroneal, Deep Peroneal and Tibial normal  Vascular  Exam: DP intact    Assessment:    3 Days Post-Op  Procedure(s) (LRB): TOTAL KNEE ARTHROPLASTY (Right)  ADDITIONAL DIAGNOSIS:  Principal Problem:  *Osteoarthritis of knee Active Problems:  Obesity, Class II, BMI 35-39.9, with comorbidity  Hypertension  Prostate carcinoma  Melanoma  Hiatal hernia  Asthma  Postoperative anemia due to acute blood loss  Acute Blood Loss Anemia and Hypokalemia   Plan: Physical Therapy as ordered Partial Weight Bearing @ 50% (PWB)  DVT Prophylaxis:  Xarelto, Foot Pumps and TED hose  DISCHARGE PLAN: Home  DISCHARGE NEEDS: HHPT, CPM, Walker and 3-in-1 comode seat  D/C to home today F/U office in 2 weeks        [facility] 04/25/2011, 8:01 AM

## 2011-04-25 NOTE — Progress Notes (Signed)
Patient ID: [name] [name], male   DOB: 08-20-1940, 71 y.o.   MRN: [id number] PATIENT ID:      [name] [name]  MRN:     [id number] DOB/AGE:    [birth year] / 71 y.o.    PROGRESS NOTE Subjective:  negative for Chest Pain  negative for Shortness of Breath  negative for Nausea/Vomiting   negative for Calf Pain  negative for Bowel Movement   Tolerating Diet: yes         Patient reports pain as mild.    Objective: Vital signs in last 24 hours:   Patient Vitals for the past 24 hrs:  BP Temp Pulse Resp SpO2  04/25/11 0528 133/60 mmHg 98.7 F (37.1 C) 72  18  94 %  04/24/11 2213 132/48 mmHg 99.6 F (37.6 C) 73  18  94 %  04/24/11 1255 131/51 mmHg 98.5 F (36.9 C) 70  18  94 %    @flow {1959:LAST@   Intake/Output from previous day:   02/14 0701 - 02/15 0700 In: 360 [P.O.:360] Out: 800 [Urine:800]   Intake/Output this shift:       Intake/Output      02/14 0701 - 02/15 0700 02/15 0701 - 02/16 0700   P.O. 360    IV Piggyback     Total Intake(mL/kg) 360 (3.2)    Urine (mL/kg/hr) 800 (0.3)    Drains     Total Output 800    Net -440            LABORATORY DATA:  Basename 04/25/11 0700 04/24/11 0540 04/23/11 0515  WBC 8.8 8.1 7.0  HGB 10.1* 9.6* 10.9*  HCT 29.5* 28.3* 32.1*  PLT 154 132* 132*    Basename 04/24/11 0540 04/23/11 0515  NA 138 140  K 3.4* 3.6  CL 105 103  CO2 29 31  BUN 10 12  CREATININE 0.96 1.07  GLUCOSE 125* 123*  CALCIUM 8.3* 8.4   Lab Results  Component Value Date   INR 1.08 04/11/2011   INR 1.1 09/29/2008   INR 1.0 09/29/2008    Examination:  General appearance: alert, cooperative and no distress  Wound Exam: clean, dry, intact   Drainage:  None: wound tissue dry  Motor Exam: EHL, FHL, Anterior Tibial and Posterior Tibial Intact  Sensory Exam: Superficial Peroneal, Deep Peroneal and Tibial normal  Vascular Exam: intact   Assessment:    3 Days Post-Op  Procedure(s) (LRB): TOTAL KNEE ARTHROPLASTY (Right)  ADDITIONAL DIAGNOSIS:    Principal Problem:  *Osteoarthritis of knee Active Problems:  Hypertension  Prostate carcinoma  Melanoma  Hiatal hernia  Asthma  Obesity, Class II, BMI 35-39.9, with comorbidity  Postoperative anemia due to acute blood loss  Acute Blood Loss Anemia   Plan: Physical Therapy as ordered Partial Weight Bearing @ 50% (PWB)  DVT Prophylaxis:  Xarelto  DISCHARGE PLAN: Home  DISCHARGE NEEDS: HHPT, Walker and 3-in-1 comode seat         Saleena Tamas W 04/25/2011, 7:56 AM

## 2011-04-25 NOTE — Progress Notes (Signed)
Physical Therapy Treatment Patient Details Name: [name] [name] MRN: [id number] DOB: Sep 05, 1940 Today's Date: 04/25/2011  PT Assessment/Plan  PT - Assessment/Plan Comments on Treatment Session: Seated knee flexion AA 0-40 degrees.  Pt. encouraged to push exercises as much as possible.  Wife has arranged for their son to help her with getting pt. into home once he is DC'd.  Will need to continue with HHPT to maximize knee function and mobility. PT Plan: Discharge plan remains appropriate PT Frequency: 7X/week Follow Up Recommendations: Home health PT Equipment Recommended: None recommended by PT PT Goals  Acute Rehab PT Goals PT Goal: Supine/Side to Sit - Progress: Progressing toward goal PT Goal: Sit to Stand - Progress: Progressing toward goal PT Goal: Stand to Sit - Progress: Progressing toward goal PT Goal: Ambulate - Progress: Progressing toward goal PT Goal: Up/Down Stairs - Progress: Progressing toward goal PT Goal: Perform Home Exercise Program - Progress: Progressing toward goal  PT Treatment Precautions/Restrictions  Precautions Precautions: Knee Restrictions Weight Bearing Restrictions: Yes RLE Weight Bearing: Partial weight bearing RLE Partial Weight Bearing Percentage or Pounds: 50% Other Position/Activity Restrictions: Noted in progress notes, WBing status PWB 25%; proceeded with 25% PWB during eval, as this was more conservative Mobility (including Balance) Bed Mobility Bed Mobility: Yes Supine to Sit: 4: Min assist;Other (comment) (assist to move right LE) Supine to Sit Details (indicate cue type and reason): used sheet to move right LE but still needed PT assist Transfers Transfers: Yes Sit to Stand: 4: Min assist Sit to Stand Details (indicate cue type and reason): needed cueing for hand placement and technique Stand to Sit: 4: Min assist Stand to Sit Details: min assist for hand placement Ambulation/Gait Ambulation/Gait: Yes Ambulation/Gait Assistance:  4: Min assist Ambulation/Gait Assistance Details (indicate cue type and reason): cues for 50% PWB Ambulation Distance (Feet): 20 Feet Assistive device: Rolling walker Gait Pattern: Step-to pattern Stairs: Yes Stairs Assistance: 1: +2 Total assist;Patient percentage (comment) (pt. 60%) Stairs Assistance Details (indicate cue type and reason): one person primarily to brace RW; cues for sequence and technique Stair Management Technique: No rails;Backwards;With walker Number of Stairs: 2     Exercise  Total Joint Exercises Ankle Circles/Pumps: AROM;10 reps;Supine Quad Sets: AROM;Right;10 reps Short Arc QuadBarbaraann Boys;Right;5 reps;Supine End of Session PT - End of Session Equipment Utilized During Treatment: Gait belt Activity Tolerance: Patient limited by fatigue;Patient limited by pain Patient left: in chair;with family/visitor present;with call bell in reach Nurse Communication: Mobility status for transfers;Mobility status for ambulation;Weight bearing status General Behavior During Session: [facility] for tasks performed Cognition: [facility] for tasks performed  Ferman Hamming 04/25/2011, 12:28 PM Acute Rehabilitation Services [phone] (pager)

## 2011-04-25 NOTE — Progress Notes (Signed)
Clinical Social Work-CSW confirmed [facility] would have a private room for pt today. CSW confirmed co-pays with SNF and discussed options with pt and wife at bedside. Pt and wife opt to d/c home-CSW provided contact information in the event that pt has needs/concerns at home. No further needs at this time-CSW signing off-Rosselyn Martha-MSW, [phone]

## 2011-09-05 ENCOUNTER — Inpatient Hospital Stay (HOSPITAL_COMMUNITY)
Admission: EM | Admit: 2011-09-05 | Discharge: 2011-09-09 | DRG: 287 | Disposition: A | Discharge status: Home / Self Care | Payer: Medicare Other | Attending: Internal Medicine | Admitting: Internal Medicine

## 2011-09-05 ENCOUNTER — Encounter (HOSPITAL_COMMUNITY): Payer: Self-pay | Admitting: Emergency Medicine

## 2011-09-05 DIAGNOSIS — I10 Essential (primary) hypertension: Secondary | ICD-10-CM

## 2011-09-05 DIAGNOSIS — C61 Malignant neoplasm of prostate: Secondary | ICD-10-CM | POA: Diagnosis present

## 2011-09-05 DIAGNOSIS — M179 Osteoarthritis of knee, unspecified: Secondary | ICD-10-CM | POA: Diagnosis present

## 2011-09-05 DIAGNOSIS — R079 Chest pain, unspecified: Secondary | ICD-10-CM | POA: Diagnosis present

## 2011-09-05 DIAGNOSIS — Z8546 Personal history of malignant neoplasm of prostate: Secondary | ICD-10-CM | POA: Diagnosis present

## 2011-09-05 DIAGNOSIS — IMO0001 Reserved for inherently not codable concepts without codable children: Secondary | ICD-10-CM | POA: Diagnosis present

## 2011-09-05 DIAGNOSIS — J209 Acute bronchitis, unspecified: Secondary | ICD-10-CM | POA: Diagnosis present

## 2011-09-05 DIAGNOSIS — E669 Obesity, unspecified: Secondary | ICD-10-CM | POA: Diagnosis present

## 2011-09-05 DIAGNOSIS — J45909 Unspecified asthma, uncomplicated: Secondary | ICD-10-CM | POA: Diagnosis present

## 2011-09-05 DIAGNOSIS — M1710 Unilateral primary osteoarthritis, unspecified knee: Secondary | ICD-10-CM | POA: Diagnosis present

## 2011-09-05 DIAGNOSIS — I200 Unstable angina: Principal | ICD-10-CM | POA: Diagnosis present

## 2011-09-05 NOTE — ED Notes (Signed)
Pt presents to triage c/o chest pain and is diaphoretic  Pt states has nausea  Pt color is pale  Taken to room res. B   Pt states the pain started in his stomach and was a burning pain so he took some antiacids without relief  Pt states pain has increased over the past hour

## 2011-09-06 ENCOUNTER — Encounter (HOSPITAL_COMMUNITY): Payer: Self-pay | Admitting: Internal Medicine

## 2011-09-06 ENCOUNTER — Emergency Department (HOSPITAL_COMMUNITY): Payer: Medicare Other

## 2011-09-06 DIAGNOSIS — R079 Chest pain, unspecified: Secondary | ICD-10-CM | POA: Diagnosis present

## 2011-09-06 DIAGNOSIS — I10 Essential (primary) hypertension: Secondary | ICD-10-CM

## 2011-09-06 DIAGNOSIS — G4730 Sleep apnea, unspecified: Secondary | ICD-10-CM

## 2011-09-06 DIAGNOSIS — I359 Nonrheumatic aortic valve disorder, unspecified: Secondary | ICD-10-CM

## 2011-09-06 LAB — CARDIAC PANEL(CRET KIN+CKTOT+MB+TROPI)
CK, MB: 2.4 ng/mL (ref 0.3–4.0)
Relative Index: INVALID (ref 0.0–2.5)
Relative Index: INVALID (ref 0.0–2.5)
Total CK: 31 U/L (ref 7–232)
Total CK: 40 U/L (ref 7–232)

## 2011-09-06 LAB — POCT I-STAT TROPONIN I

## 2011-09-06 LAB — POCT I-STAT, CHEM 8
BUN: 15 mg/dL (ref 6–23)
Calcium, Ion: 1.11 mmol/L — ABNORMAL LOW (ref 1.12–1.32)
Creatinine, Ser: 1.1 mg/dL (ref 0.50–1.35)
Glucose, Bld: 108 mg/dL — ABNORMAL HIGH (ref 70–99)
Hemoglobin: 12.9 g/dL — ABNORMAL LOW (ref 13.0–17.0)
TCO2: 26 mmol/L (ref 0–100)

## 2011-09-06 LAB — CBC
MCV: 86.2 fL (ref 78.0–100.0)
Platelets: 156 10*3/uL (ref 150–400)
RDW: 14 % (ref 11.5–15.5)
WBC: 9 10*3/uL (ref 4.0–10.5)

## 2011-09-06 LAB — BASIC METABOLIC PANEL
Calcium: 8.7 mg/dL (ref 8.4–10.5)
Creatinine, Ser: 0.97 mg/dL (ref 0.50–1.35)
GFR calc Af Amer: >90 mL/min (ref >90)

## 2011-09-06 LAB — MRSA PCR SCREENING: MRSA by PCR: NEGATIVE

## 2011-09-06 LAB — HEPARIN LEVEL (UNFRACTIONATED): Heparin Unfractionated: 0.3 IU/mL (ref 0.30–0.70)

## 2011-09-06 MED ORDER — PREDNISONE 20 MG PO TABS
40.0000 mg | ORAL_TABLET | Freq: Every day | ORAL | Status: DC
  Administered 2011-09-07 – 2011-09-09 (×2): 40 mg via ORAL
  Filled 2011-09-06 (×4): qty 2

## 2011-09-06 MED ORDER — NITROGLYCERIN IN D5W 200-5 MCG/ML-% IV SOLN
5.0000 ug/min | INTRAVENOUS | Status: DC
  Administered 2011-09-06: 5 ug/min via INTRAVENOUS
  Filled 2011-09-06: qty 250

## 2011-09-06 MED ORDER — BIOTENE DRY MOUTH MT LIQD
15.0000 mL | Freq: Two times a day (BID) | OROMUCOSAL | Status: DC
  Administered 2011-09-06 – 2011-09-07 (×3): 15 mL via OROMUCOSAL

## 2011-09-06 MED ORDER — MONTELUKAST SODIUM 10 MG PO TABS
10.0000 mg | ORAL_TABLET | Freq: Every day | ORAL | Status: DC
  Administered 2011-09-06 – 2011-09-09 (×3): 10 mg via ORAL
  Filled 2011-09-06 (×5): qty 1

## 2011-09-06 MED ORDER — HYDROMORPHONE HCL PF 1 MG/ML IJ SOLN: 1.0000 mg | INTRAMUSCULAR | Status: DC | PRN

## 2011-09-06 MED ORDER — PANTOPRAZOLE SODIUM 40 MG PO TBEC
40.0000 mg | DELAYED_RELEASE_TABLET | Freq: Every day | ORAL | Status: DC
  Administered 2011-09-06 – 2011-09-09 (×3): 40 mg via ORAL
  Filled 2011-09-06 (×4): qty 1

## 2011-09-06 MED ORDER — SENNA 8.6 MG PO TABS
1.0000 | ORAL_TABLET | Freq: Two times a day (BID) | ORAL | Status: DC
  Administered 2011-09-06 – 2011-09-09 (×4): 8.6 mg via ORAL
  Filled 2011-09-06 (×5): qty 1

## 2011-09-06 MED ORDER — SODIUM CHLORIDE 0.9 % IJ SOLN: 3.0000 mL | INTRAMUSCULAR | Status: DC | PRN

## 2011-09-06 MED ORDER — IRBESARTAN 300 MG PO TABS
300.0000 mg | ORAL_TABLET | Freq: Every day | ORAL | Status: DC
  Administered 2011-09-06 – 2011-09-09 (×3): 300 mg via ORAL
  Filled 2011-09-06 (×5): qty 1

## 2011-09-06 MED ORDER — ASPIRIN 81 MG PO CHEW
324.0000 mg | CHEWABLE_TABLET | Freq: Once | ORAL | Status: AC
  Administered 2011-09-06: 324 mg via ORAL
  Filled 2011-09-06: qty 1

## 2011-09-06 MED ORDER — SODIUM CHLORIDE 0.9 % IV SOLN
250.0000 mL | INTRAVENOUS | Status: DC | PRN
  Administered 2011-09-06: 10 mL/h via INTRAVENOUS
  Administered 2011-09-07: 250 mL via INTRAVENOUS

## 2011-09-06 MED ORDER — SODIUM CHLORIDE 0.9 % IJ SOLN
3.0000 mL | Freq: Two times a day (BID) | INTRAMUSCULAR | Status: DC
  Administered 2011-09-06: 3 mL via INTRAVENOUS

## 2011-09-06 MED ORDER — POLYVINYL ALCOHOL 1.4 % OP SOLN
1.0000 [drp] | Freq: Three times a day (TID) | OPHTHALMIC | Status: DC | PRN
  Filled 2011-09-06: qty 15

## 2011-09-06 MED ORDER — FLUTICASONE PROPIONATE 50 MCG/ACT NA SUSP
2.0000 | Freq: Every day | NASAL | Status: DC
  Administered 2011-09-06 – 2011-09-08 (×2): 2 via NASAL
  Filled 2011-09-06 (×2): qty 16

## 2011-09-06 MED ORDER — CALCIUM CARBONATE-VITAMIN D 500-200 MG-UNIT PO TABS
1.0000 | ORAL_TABLET | Freq: Two times a day (BID) | ORAL | Status: DC
  Administered 2011-09-06 – 2011-09-09 (×6): 1 via ORAL
  Filled 2011-09-06 (×11): qty 1

## 2011-09-06 MED ORDER — NEBIVOLOL HCL 5 MG PO TABS
5.0000 mg | ORAL_TABLET | Freq: Every day | ORAL | Status: DC
  Administered 2011-09-07 – 2011-09-09 (×2): 5 mg via ORAL
  Filled 2011-09-06 (×6): qty 1

## 2011-09-06 MED ORDER — MORPHINE SULFATE 4 MG/ML IJ SOLN
4.0000 mg | Freq: Once | INTRAMUSCULAR | Status: AC
  Administered 2011-09-06: 4 mg via INTRAVENOUS
  Filled 2011-09-06: qty 1

## 2011-09-06 MED ORDER — GI COCKTAIL ~~LOC~~
ORAL | Status: AC
  Administered 2011-09-06: 30 mL
  Filled 2011-09-06: qty 30

## 2011-09-06 MED ORDER — PREDNISONE 10 MG PO TABS
10.0000 mg | ORAL_TABLET | Freq: Every day | ORAL | Status: DC
  Administered 2011-09-06: 10 mg via ORAL
  Filled 2011-09-06 (×2): qty 1

## 2011-09-06 MED ORDER — HEPARIN (PORCINE) IN NACL 100-0.45 UNIT/ML-% IJ SOLN
1000.0000 [IU]/h | INTRAMUSCULAR | Status: DC
  Administered 2011-09-06 – 2011-09-07 (×2): 1000 [IU]/h via INTRAVENOUS
  Filled 2011-09-06 (×4): qty 250

## 2011-09-06 MED ORDER — SODIUM CHLORIDE 0.9 % IJ SOLN
3.0000 mL | Freq: Two times a day (BID) | INTRAMUSCULAR | Status: DC
  Administered 2011-09-06 – 2011-09-07 (×3): 3 mL via INTRAVENOUS

## 2011-09-06 MED ORDER — NITROGLYCERIN IN D5W 200-5 MCG/ML-% IV SOLN
2.0000 ug/min | INTRAVENOUS | Status: DC
  Administered 2011-09-06: 5 ug/min via INTRAVENOUS

## 2011-09-06 MED ORDER — ONDANSETRON HCL 4 MG PO TABS: 4.0000 mg | ORAL_TABLET | Freq: Four times a day (QID) | ORAL | Status: DC | PRN

## 2011-09-06 MED ORDER — ONDANSETRON HCL 4 MG/2ML IJ SOLN: 4.0000 mg | Freq: Four times a day (QID) | INTRAMUSCULAR | Status: DC | PRN

## 2011-09-06 MED ORDER — ONDANSETRON HCL 4 MG/2ML IJ SOLN
4.0000 mg | Freq: Once | INTRAMUSCULAR | Status: AC
  Administered 2011-09-06: 4 mg via INTRAVENOUS
  Filled 2011-09-06: qty 2

## 2011-09-06 MED ORDER — HEPARIN BOLUS VIA INFUSION
4000.0000 [IU] | Freq: Once | INTRAVENOUS | Status: AC
  Administered 2011-09-06: 4000 [IU] via INTRAVENOUS

## 2011-09-06 MED ORDER — ASPIRIN EC 325 MG PO TBEC
325.0000 mg | DELAYED_RELEASE_TABLET | Freq: Every day | ORAL | Status: DC
  Administered 2011-09-06 – 2011-09-09 (×4): 325 mg via ORAL
  Filled 2011-09-06 (×5): qty 1

## 2011-09-06 MED ORDER — ACETAMINOPHEN 325 MG PO TABS
650.0000 mg | ORAL_TABLET | Freq: Four times a day (QID) | ORAL | Status: DC | PRN
  Administered 2011-09-06: 650 mg via ORAL
  Filled 2011-09-06: qty 2

## 2011-09-06 MED ORDER — NITROGLYCERIN 0.4 MG SL SUBL
0.4000 mg | SUBLINGUAL_TABLET | SUBLINGUAL | Status: DC | PRN
  Administered 2011-09-06 (×2): 0.4 mg via SUBLINGUAL

## 2011-09-06 NOTE — ED Provider Notes (Signed)
History     CSN: [id number]  Arrival date & time 09/05/11  2338   First MD Initiated Contact with Patient 09/06/11 0104      Chief Complaint  Patient presents with  . Chest Pain    (Consider location/radiation/quality/duration/timing/severity/associated sxs/prior treatment) HPI History provided by patient. At home tonight sitting down developed substernal chest pressure radiates to both right and left sides but not to back, arms or jaw. Has associated shortness of breath nausea and diaphoresis. No vomiting. No recent fevers or chills. No leg pain or swelling. Has had some recent cough and saw primary care physician last week the symptoms are improving. Take antacids at home without relief and called EMS. Moderate severity. No known aggravating or alleviating factors. No history of similar pain. No history of MI.  Past Medical History  Diagnosis Date  . Hypertension   . Dysrhythmia     palpitations, followed by Dr.Ehinger, seen in prep for surgery on 03/27/2011   . Angina     chest pain- cardiac cath. followed in 2010, record available ,  told then that he should f/u /w Dr. Manus Gunning    . Asthma     uses  singulair  . Pneumonia     hosp. 20 yrs. ago  . Sleep apnea     study done, 10 yrs. ago, told that he needed  CPAP but  never used   . Hepatitis     jaundice- many yrs. ago  . Chronic kidney disease     prostate cancer - surg. removal- 1996  . Cancer     prostate, melanoma- 2010, excision    . Arthritis     R knee, back      Past Surgical History  Procedure Date  . Cardiac catheterization     2010  . Tonsillectomy     as child  . Fracture surgery     L wrist, hardware- 1982  . Joint replacement     L knee, 2009  . Prostatectomy     for ca  . Total knee arthroplasty 04/22/2011    Procedure: TOTAL KNEE ARTHROPLASTY;  Surgeon: Valeria Batman, MD;  Location: [facility] OR;  Service: Orthopedics;  Laterality: Right;    Family History  Problem Relation Age of Onset  .  Anesthesia problems Neg Hx   . Hypotension Neg Hx   . Malignant hyperthermia Neg Hx   . Pseudochol deficiency Neg Hx     History  Substance Use Topics  . Smoking status: Former Smoker    Quit date: 04/10/1978  . Smokeless tobacco: Not on file  . Alcohol Use: No      Review of Systems  Constitutional: Negative for fever and chills.  HENT: Negative for neck pain and neck stiffness.   Eyes: Negative for pain.  Respiratory: Negative for shortness of breath.   Cardiovascular: Positive for chest pain.  Gastrointestinal: Negative for abdominal pain.  Genitourinary: Negative for dysuria.  Musculoskeletal: Negative for back pain.  Skin: Negative for rash.  Neurological: Negative for headaches.  All other systems reviewed and are negative.    Allergies  Aminophylline; Lisinopril; Tenormin; and Zolpidem tartrate  Home Medications   Current Outpatient Rx  Name Route Sig Dispense Refill  . CALCIUM CARBONATE-VITAMIN D 600-200 MG-UNIT PO TABS Oral Take 1 tablet by mouth 2 (two) times daily.    Marland Kitchen FLUTICASONE PROPIONATE 50 MCG/ACT NA SUSP Nasal Place 2 sprays into the nose at bedtime.     Marland Kitchen HYPROMELLOSE 2.5 %  OP SOLN Both Eyes Place 1 drop into both eyes 3 (three) times daily as needed. For dry eyes    . IBUPROFEN 200 MG PO TABS Oral Take 600 mg by mouth 2 (two) times daily.    Marland Kitchen MONTELUKAST SODIUM 10 MG PO TABS Oral Take 10 mg by mouth daily before breakfast.     . NEBIVOLOL HCL 5 MG PO TABS Oral Take 5 mg by mouth every morning.     Marland Kitchen OMEPRAZOLE 40 MG PO CPDR Oral Take 40 mg by mouth daily.    . TELMISARTAN 80 MG PO TABS Oral Take 80 mg by mouth daily.      BP 152/74  Pulse 63  Resp 17  SpO2 93%  Physical Exam  Constitutional: He is oriented to person, place, and time. He appears well-developed and well-nourished.  HENT:  Head: Normocephalic and atraumatic.  Eyes: Conjunctivae and EOM are normal. Pupils are equal, round, and reactive to light.  Neck: Trachea normal. Neck  supple. No thyromegaly present.  Cardiovascular: Normal rate, regular rhythm, S1 normal, S2 normal and normal pulses.     No systolic murmur is present   No diastolic murmur is present  Pulses:      Radial pulses are 2+ on the right side, and 2+ on the left side.  Pulmonary/Chest: Effort normal and breath sounds normal. He has no wheezes. He has no rhonchi. He has no rales. He exhibits no tenderness.  Abdominal: Soft. Normal appearance and bowel sounds are normal. There is no tenderness. There is no CVA tenderness and negative Murphy's sign.  Musculoskeletal:       BLE:s Calves nontender, no cords or erythema, negative Homans sign  Neurological: He is alert and oriented to person, place, and time. He has normal strength. No cranial nerve deficit or sensory deficit. GCS eye subscore is 4. GCS verbal subscore is 5. GCS motor subscore is 6.  Skin: Skin is warm and dry. No rash noted. He is not diaphoretic.  Psychiatric: His speech is normal.       Cooperative and appropriate    ED Course  Procedures (including critical care time)  Results for orders placed during the hospital encounter of 09/05/11  POCT I-STAT, CHEM 8      Component Value Range   Sodium 142  135 - 145 mEq/L   Potassium 3.6  3.5 - 5.1 mEq/L   Chloride 102  96 - 112 mEq/L   BUN 15  6 - 23 mg/dL   Creatinine, Ser 4.09  0.50 - 1.35 mg/dL   Glucose, Bld 811 (*) 70 - 99 mg/dL   Calcium, Ion 9.14 (*) 1.12 - 1.32 mmol/L   TCO2 26  0 - 100 mmol/L   Hemoglobin 12.9 (*) 13.0 - 17.0 g/dL   HCT 78.2 (*) 95.6 - 21.3 %  POCT I-STAT TROPONIN I      Component Value Range   Troponin i, poc 0.01  0.00 - 0.08 ng/mL   Comment 3            Dg Chest Portable 1 View  09/06/2011  *RADIOLOGY REPORT*  Clinical Data: Chest pain  PORTABLE CHEST - 1 VIEW  Comparison: 04/11/2011  Findings: Heart size upper normal to mildly enlarged.  Mediastinal prominence may be exaggerated by technique.  Mild lingular scarring versus atelectasis, decreased.   No pleural effusion or pneumothorax.  Multilevel degenerative change without acute osseous finding.  IMPRESSION: Mildly prominent cardiomediastinal contours without radiographic evidence of acute cardiopulmonary process.  Original Report Authenticated By: Waneta Martins, M.D.     Date: 09/06/2011  Rate: 60  Rhythm: normal sinus rhythm  QRS Axis: normal  Intervals: normal  ST/T Wave abnormalities: nonspecific ST changes  Conduction Disutrbances:none  Narrative Interpretation:   Old EKG Reviewed: unchanged  ASA. NTG. MOrphine.  On recheck pain improving with nitroglycerin and nitroglycerin drip initiated.   MDM   Chest pain in a 71 year old male with cardiac factors and reported cath and 2010 by Dr. Mayford Knife  EKG, labs and x-ray obtained and reviewed as above.  1:18 AM d/w Dr Conley Rolls who agrees to ED eval at this time and admit to step down for ongoing CP.       Sunnie Nielsen, MD 09/06/11 [phone]

## 2011-09-06 NOTE — Progress Notes (Signed)
  Echocardiogram 2D Echocardiogram has been performed.  [name] [name] 09/06/2011, 11:33 AM

## 2011-09-06 NOTE — Progress Notes (Signed)
ANTICOAGULATION CONSULT NOTE  Pharmacy Consult for heparin Indication: chest pain/ACS  Allergies  Allergen Reactions  . Aminophylline Anaphylaxis  . Lisinopril Cough  . Tenormin (Atenolol) Other (See Comments)    fatigue  . Zolpidem Tartrate Other (See Comments)    hallucinations    Patient Measurements: Height: 5\' 7"  (170.2 cm) Weight: 258 lb 2.5 oz (117.1 kg) IBW/kg (Calculated) : 66.1   Vital Signs: Temp: 97.6 F (36.4 C) (06/29 1200) Temp src: Oral (06/29 1200) BP: 137/63 mmHg (06/29 1400) Pulse Rate: 54  (06/29 1400)  Labs:  Basename 09/06/11 1412 09/06/11 1142 09/06/11 0508 09/06/11 0032  HGB -- -- 11.6* 12.9*  HCT -- -- 34.3* 38.0*  PLT -- -- 156 --  APTT -- -- -- --  LABPROT -- -- -- --  INR -- -- -- --  HEPARINUNFRC 0.34 -- -- --  CREATININE -- -- 0.97 1.10  CKTOTAL -- 31 36 --  CKMB -- 2.4 2.4 --  TROPONINI -- <0.30 <0.30 --    Estimated Creatinine Clearance: 83 ml/min (by C-G formula based on Cr of 0.97).   Medical History: Past Medical History  Diagnosis Date  . Hypertension   . Dysrhythmia     palpitations, followed by Dr.Ehinger, seen in prep for surgery on 03/27/2011   . Angina     chest pain- cardiac cath. followed in 2010, record available ,  told then that he should f/u /w Dr. Manus Gunning    . Asthma     uses  singulair  . Pneumonia     hosp. 20 yrs. ago  . Sleep apnea     study done, 10 yrs. ago, told that he needed  CPAP but  never used   . Hepatitis     jaundice- many yrs. ago  . Chronic kidney disease     prostate cancer - surg. removal- 1996  . Cancer     prostate, melanoma- 2010, excision    . Arthritis     R knee, back      Medications:  Infusions:     . heparin 1,000 Units/hr (09/06/11 1400)  . nitroGLYCERIN 5 mcg/min (09/06/11 1400)  . DISCONTD: nitroGLYCERIN 5 mcg/min (09/06/11 1610)    Assessment:  64 YOM patient admit with chest pain on 6/29  Troponin: < 0.3, < 0.3  First heparin level is therapeutic at  0.34  Planned cardiac cath at [facility] on Monday, 7/1  Platelets wnl  Goal of Therapy:  Heparin level 0.3-0.7 units/ml Monitor platelets by anticoagulation protocol: Yes   Plan:   Continue heparin IV infusion at 1000 units/hr  Heparin level in 8 hours to confirm therapeutic level  Daily heparin level and CBC   Lynann Beaver PharmD, BCPS Pager [phone] 09/06/2011 2:44 PM

## 2011-09-06 NOTE — Progress Notes (Signed)
Pt reassessed for pain after receiving Tylenol for headache. Pt stil c/o of 5/10 headache, denies chest pain. Devra Dopp MD, MD advised to turn off Nitro drip. V/S stable, will continue to monitor.

## 2011-09-06 NOTE — Consult Note (Signed)
Cardiology Consult Note No primary provider on file. No ref. provider found  Reason for consult: chest pain  History of Present Illness (and review of medical records): [name] [name] is a 71 y.o. male who presents for evaluation of chest pain.  Pain began around 1130pm while at home at rest.  Pain was sudden, mid sternal with radiation to left arm.  Pain was also associated with nausea and diaphoresis.  He described pain was burning sensation which he thought was indigestion.  Pain was unrelieved however with Rolaids.  His pain continued to get worse and thus presented to ED for further evaluation.  He initially had SBP in 200s upon arrival with persistent pain after initial ASA, NTG, and Gi cocktail.  ECG revealed no acute abnormalities and troponin was normal.  He was seen by Dr. Conley Rolls of the Hospitalist service and we have been asked to make further recommendations regarding this patient's care.  He is now much improved after Nitro and Heparin infusion.  Previous diagnostic testing for coronary artery disease includes: cardiac catheterization. Previous history of cardiac disease includes Chest Pain Palpitations. Coronary artery disease risk factors include: advanced age (older than 75 for men, 58 for women), hypertension, male gender and obesity (BMI >= 30 kg/m2). Patient denies history of CHF, coronary artery disease and previous M.I..  Review of Systems A comprehensive review of systems was negative other than stated in HPI. Patient Active Problem List   Diagnosis Date Noted  . Chest pain at rest 09/06/2011  . HTN (hypertension) 09/06/2011  . Postoperative anemia due to acute blood loss 04/24/2011  . Osteoarthritis of knee 04/23/2011  . Hypertension 04/23/2011  . Prostate carcinoma 04/23/2011  . Melanoma 04/23/2011  . Hiatal hernia 04/23/2011  . Asthma 04/23/2011  . Obesity, Class II, BMI 35-39.9, with comorbidity 04/23/2011   Past Medical History  Diagnosis Date  . Hypertension    . Dysrhythmia     palpitations, followed by Dr.Ehinger, seen in prep for surgery on 03/27/2011   . Angina     chest pain- cardiac cath. followed in 2010, record available ,  told then that he should f/u /w Dr. Manus Gunning    . Asthma     uses  singulair  . Pneumonia     hosp. 20 yrs. ago  . Sleep apnea     study done, 10 yrs. ago, told that he needed  CPAP but  never used   . Hepatitis     jaundice- many yrs. ago  . Chronic kidney disease     prostate cancer - surg. removal- 1996  . Cancer     prostate, melanoma- 2010, excision    . Arthritis     R knee, back      Past Surgical History  Procedure Date  . Cardiac catheterization     2010  . Tonsillectomy     as child  . Fracture surgery     L wrist, hardware- 1982  . Joint replacement     L knee, 2009  . Prostatectomy     for ca  . Total knee arthroplasty 04/22/2011    Procedure: TOTAL KNEE ARTHROPLASTY;  Surgeon: Valeria Batman, MD;  Location: [facility] OR;  Service: Orthopedics;  Laterality: Right;     Current Outpatient Prescriptions  Medication Sig Dispense Refill  . Calcium Carbonate-Vitamin D (CALCIUM + D) 600-200 MG-UNIT TABS Take 1 tablet by mouth 2 (two) times daily.      . fluticasone (FLONASE) 50 MCG/ACT nasal  spray Place 2 sprays into the nose at bedtime.       . hydroxypropyl methylcellulose (ISOPTO TEARS) 2.5 % ophthalmic solution Place 1 drop into both eyes 3 (three) times daily as needed. For dry eyes      . ibuprofen (ADVIL,MOTRIN) 200 MG tablet Take 600 mg by mouth 2 (two) times daily.      . montelukast (SINGULAIR) 10 MG tablet Take 10 mg by mouth daily before breakfast.       . nebivolol (BYSTOLIC) 5 MG tablet Take 5 mg by mouth every morning.       Marland Kitchen omeprazole (PRILOSEC) 40 MG capsule Take 40 mg by mouth daily.      Marland Kitchen telmisartan (MICARDIS) 80 MG tablet Take 80 mg by mouth daily.        Allergies  Allergen Reactions  . Aminophylline Anaphylaxis  . Lisinopril Cough  . Tenormin (Atenolol) Other (See  Comments)    fatigue  . Zolpidem Tartrate Other (See Comments)    hallucinations    History  Substance Use Topics  . Smoking status: Former Smoker    Quit date: 04/10/1978  . Smokeless tobacco: Not on file  . Alcohol Use: No    Family History  Problem Relation Age of Onset  . Anesthesia problems Neg Hx   . Hypotension Neg Hx   . Malignant hyperthermia Neg Hx   . Pseudochol deficiency Neg Hx      Objective: Patient Vitals for the past 8 hrs:  BP Pulse Resp SpO2  09/06/11 0219 140/58 mmHg 54  13  96 %  09/06/11 0047 152/74 mmHg 63  17  93 %  09/06/11 0035 190/78 mmHg 63  - 97 %  09/05/11 2344 214/95 mmHg 60  24  100 %   General Appearance:    Alert, cooperative, no distress, appears stated age, obese male  Head:    Normocephalic, without obvious abnormality, atraumatic  Eyes:     PERRL, EOMI, anicteric sclerae  Neck:   Supple, no carotid bruit or JVD  Lungs:     Clear to auscultation bilaterally, respirations unlabored  Heart:    Regular rate and rhythm, S1 and S2 normal, no murmurs  Abdomen:     Soft, non-tender, normoactive bowel sounds  Extremities:   Extremities normal, atraumatic, no cyanosis 1+ BLE edema  Pulses:   2+ and symmetric all extremities  Skin:   no rashes or lesions  Neurologic:   No focal deficits. AAO x3   Results for orders placed during the hospital encounter of 09/05/11 (from the past 48 hour(s))  POCT I-STAT TROPONIN I     Status: Normal   Collection Time   09/06/11 12:31 AM      Component Value Range Comment   Troponin i, poc 0.01  0.00 - 0.08 ng/mL    Comment 3            POCT I-STAT, CHEM 8     Status: Abnormal   Collection Time   09/06/11 12:32 AM      Component Value Range Comment   Sodium 142  135 - 145 mEq/L    Potassium 3.6  3.5 - 5.1 mEq/L    Chloride 102  96 - 112 mEq/L    BUN 15  6 - 23 mg/dL    Creatinine, Ser 1.47  0.50 - 1.35 mg/dL    Glucose, Bld 829 (*) 70 - 99 mg/dL    Calcium, Ion 5.62 (*) 1.12 - 1.32 mmol/L  TCO2 26  0  - 100 mmol/L    Hemoglobin 12.9 (*) 13.0 - 17.0 g/dL    HCT 16.1 (*) 09.6 - 52.0 %    Dg Chest Portable 1 View  09/06/2011  *RADIOLOGY REPORT*  Clinical Data: Chest pain  PORTABLE CHEST - 1 VIEW  Comparison: 04/11/2011  Findings: Heart size upper normal to mildly enlarged.  Mediastinal prominence may be exaggerated by technique.  Mild lingular scarring versus atelectasis, decreased.  No pleural effusion or pneumothorax.  Multilevel degenerative change without acute osseous finding.  IMPRESSION: Mildly prominent cardiomediastinal contours without radiographic evidence of acute cardiopulmonary process.  Original Report Authenticated By: Waneta Martins, M.D.    ECG:   Sinus rhythm HR 60 QRS 106 QT 468 no acute ischemic changes  Cath 09/2008: The left main coronary artery is widely patent and bifurcates  into left anterior descending artery and left circumflex artery. The  left anterior descending artery is widely patent throughout its course  of the apex. It gives rise to a moderate-sized diagonal-1 branch, which  is widely patent. A second diagonal-2 branch again moderate in size  which is widely patent and a third diagonal-3 branch, which is large and  widely patent and bifurcates into two daughter branches, both of which  are widely patent. The ongoing LAD traverses to the apex and is patent.  The left circumflex is widely patent throughout its course in the AV  groove giving rise to a first obtuse marginal branch and then terminates  in a second obtuse marginal branch, both of which are widely patent.  Right coronary artery is widely patent throughout its course and  distally bifurcates into posterior descending artery and posterior  lateral artery, both of which are widely patent.  Left ventriculography shows normal LV function, EF of 60%. LV pressure  127/1 mmHg, aortic pressure 135/66 mmHg.   Impression: 60 obese male with hx of HTN who presents with acute onset of chest pain  with elevated blood pressures.  Pain and blood pressure now much improved after ASA, Nitro SL, Heparin and NTG infusion.  Differential includes Acute coronary syndrome, hypertensive urgency and less likely at this time PE or aortic dissection.  Recommendations: --Repeat ECG prn with chest pain or arrythmia --Serial cardiac biomarkers, Check lipids, [id number] --Given patient's improvement, continue ASA/Heparin/Nitro infusion.  If cardiac biomarkers remain negative, would consider holding anticoagulation. --TTE in am assess any change in LV function (prior normal EF 60%), wall motion abnormalities or valvular disorders --Will reassess and make further recommendations upon results of initial studies and clinical course. --Plan discussed with primary team, patient, and family members at bedside.   Thank you for this consult and allowing [place] to participate in the care of your patient.

## 2011-09-06 NOTE — Progress Notes (Signed)
Patient ID: [name] [name], male   DOB: May 18, 1940, 71 y.o.   MRN: [id number] Patient is currently pain free. History retaken and very worrisome for ACS. Enzymes are negative thus far with unremarkable ECG. Was on prednisone taper for bronchitis on admit. Will order completion.  PE unchanged.  Have recommended cardiac cath on Monday even though no obstructive CAD on cath in 2010. Patient agrees with plan.

## 2011-09-06 NOTE — Progress Notes (Signed)
ANTICOAGULATION CONSULT NOTE - Initial Consult  Pharmacy Consult for heparin Indication: chest pain/ACS  Allergies  Allergen Reactions  . Aminophylline Anaphylaxis  . Lisinopril Cough  . Tenormin (Atenolol) Other (See Comments)    fatigue  . Zolpidem Tartrate Other (See Comments)    hallucinations    Patient Measurements: Height: 5\' 7"  (170.2 cm) Weight: 258 lb 2.5 oz (117.1 kg) IBW/kg (Calculated) : 66.1  Heparin Dosing Weight:   Vital Signs: Temp: 97.8 F (36.6 C) (06/29 0420) Temp src: Oral (06/29 0420) BP: 159/73 mmHg (06/29 0420) Pulse Rate: 56  (06/29 0420)  Labs:  [name] [name] 09/06/11 0508 09/06/11 0032  HGB 11.6* 12.9*  HCT 34.3* 38.0*  PLT 156 --  APTT -- --  LABPROT -- --  INR -- --  HEPARINUNFRC -- --  CREATININE 0.97 1.10  CKTOTAL 36 --  CKMB 2.4 --  TROPONINI <0.30 --    Estimated Creatinine Clearance: 83 ml/min (by C-G formula based on Cr of 0.97).   Medical History: Past Medical History  Diagnosis Date  . Hypertension   . Dysrhythmia     palpitations, followed by Dr.Ehinger, seen in prep for surgery on 03/27/2011   . Angina     chest pain- cardiac cath. followed in 2010, record available ,  told then that he should f/u /w Dr. Manus Gunning    . Asthma     uses  singulair  . Pneumonia     hosp. 20 yrs. ago  . Sleep apnea     study done, 10 yrs. ago, told that he needed  CPAP but  never used   . Hepatitis     jaundice- many yrs. ago  . Chronic kidney disease     prostate cancer - surg. removal- 1996  . Cancer     prostate, melanoma- 2010, excision    . Arthritis     R knee, back      Medications:  Infusions:    . heparin 1,000 Units/hr (09/06/11 0529)  . nitroGLYCERIN    . DISCONTD: nitroGLYCERIN 5 mcg/min (09/06/11 1610)    Assessment: Patient with chest pain.   Goal of Therapy:  Heparin level 0.3-0.7 units/ml Monitor platelets by anticoagulation protocol: Yes   Plan:  Heparin bolus 4000 units iv x1, then 1000 units/hr, check  level in 8hrs  [name] [name] 09/06/2011,6:08 AM

## 2011-09-06 NOTE — ED Notes (Signed)
Patient reports no change to his pain from the GI cocktail

## 2011-09-06 NOTE — Progress Notes (Signed)
DAILY PROGRESS NOTE                              GENERAL INTERNAL MEDICINE [facility]  SUBJECTIVE: Denies any chest pain, no shortness of breath or palpitations.  OBJECTIVE: BP 145/71  Pulse 51  Temp 97.8 F (36.6 C) (Oral)  Resp 15  Ht 5\' 7"  (1.702 m)  Wt 117.1 kg (258 lb 2.5 oz)  BMI 40.43 kg/m2  SpO2 96%  Intake/Output Summary (Last 24 hours) at 09/06/11 1138 Last data filed at 09/06/11 0800  Gross per 24 hour  Intake 121.07 ml  Output    350 ml  Net -228.93 ml                      Weight change:  Physical Exam: General: Alert and awake oriented x3 not in any acute distress. HEENT: anicteric sclera, pupils equal reactive to light and accommodation CVS: S1-S2 heard, no murmur rubs or gallops Chest: clear to auscultation bilaterally, no wheezing rales or rhonchi Abdomen:  normal bowel sounds, soft, nontender, nondistended, no organomegaly Neuro: Cranial nerves II-XII intact, no focal neurological deficits Extremities: no cyanosis, no clubbing or edema noted bilaterally   Lab Results:  Basename 09/06/11 0508 09/06/11 0032  NA 139 142  K 3.5 3.6  CL 104 102  CO2 30 --  GLUCOSE 98 108*  BUN 13 15  CREATININE 0.97 1.10  CALCIUM 8.7 --  MG -- --  PHOS -- --   No results found for this basename: AST:2,ALT:2,ALKPHOS:2,BILITOT:2,PROT:2,ALBUMIN:2 in the last 72 hours No results found for this basename: LIPASE:2,AMYLASE:2 in the last 72 hours  Basename 09/06/11 0508 09/06/11 0032  WBC 9.0 --  NEUTROABS -- --  HGB 11.6* 12.9*  HCT 34.3* 38.0*  MCV 86.2 --  PLT 156 --    Basename 09/06/11 0508  CKTOTAL 36  CKMB 2.4  CKMBINDEX --  TROPONINI <0.30   No components found with this basename: POCBNP:3 No results found for this basename: DDIMER:2 in the last 72 hours No results found for this basename: HGBA1C:2 in the last 72 hours No results found for this basename: CHOL:2,HDL:2,LDLCALC:2,TRIG:2,CHOLHDL:2,LDLDIRECT:2 in the last 72 hours No results found  for this basename: TSH,T4TOTAL,FREET3,T3FREE,THYROIDAB in the last 72 hours No results found for this basename: VITAMINB12:2,FOLATE:2,FERRITIN:2,TIBC:2,IRON:2,RETICCTPCT:2 in the last 72 hours  Micro Results: Recent Results (from the past 240 hour(s))  MRSA PCR SCREENING     Status: Normal   Collection Time   09/06/11  5:14 AM      Component Value Range Status Comment   MRSA by PCR NEGATIVE  NEGATIVE Final     Studies/Results: Dg Chest Portable 1 View  09/06/2011  *RADIOLOGY REPORT*  Clinical Data: Chest pain  PORTABLE CHEST - 1 VIEW  Comparison: 04/11/2011  Findings: Heart size upper normal to mildly enlarged.  Mediastinal prominence may be exaggerated by technique.  Mild lingular scarring versus atelectasis, decreased.  No pleural effusion or pneumothorax.  Multilevel degenerative change without acute osseous finding.  IMPRESSION: Mildly prominent cardiomediastinal contours without radiographic evidence of acute cardiopulmonary process.  Original Report Authenticated By: Waneta Martins, M.D.   Medications: Scheduled Meds:   . antiseptic oral rinse  15 mL Mouth Rinse BID  . aspirin  324 mg Oral Once  . aspirin EC  325 mg Oral Daily  . calcium-vitamin D  1 tablet Oral BID  . fluticasone  2 spray Each Nare QHS  .  gi cocktail      . heparin  4,000 Units Intravenous Once  . irbesartan  300 mg Oral Daily  . montelukast  10 mg Oral Daily  .  morphine injection  4 mg Intravenous Once  . nebivolol  5 mg Oral Daily  . ondansetron  4 mg Intravenous Once  . pantoprazole  40 mg Oral Daily  . predniSONE  10 mg Oral Q breakfast  . senna  1 tablet Oral BID  . sodium chloride  3 mL Intravenous Q12H  . sodium chloride  3 mL Intravenous Q12H   Continuous Infusions:   . heparin 1,000 Units/hr (09/06/11 0800)  . nitroGLYCERIN    . DISCONTD: nitroGLYCERIN 5 mcg/min (09/06/11 0204)   PRN Meds:.sodium chloride, HYDROmorphone (DILAUDID) injection, nitroGLYCERIN, ondansetron (ZOFRAN) IV,  ondansetron, polyvinyl alcohol, sodium chloride  ASSESSMENT & PLAN: Active Problems:  Osteoarthritis of knee  Prostate carcinoma  Asthma  Obesity, Class II, BMI 35-39.9, with comorbidity  Chest pain at rest  HTN (hypertension)   Chest pain -Started at rest, lasted for about 2 hours and only relieved by nitroglycerin. -Reported normal cardiac catheterization in 2010. -Patient is on nitroglycerin drip, heparin drip, aspirin and beta blockers. -Likely unstable angina, cardiology following. Plans for cardiac catheterization on Monday noted.  Hypertension -Continue his nebivolol and irbesartan, probably hold the latter on the day of the cardiac cath.  Acute bronchitis -He said he is in the middle of prednisone taper, this was restarted by cardiology.  Disposition -Keep in stepdown, likely it will be more convenient to transfer him tomorrow to [facility] to have his cardiac cath on Monday.   LOS: 1 day   Diogenes Whirley A 09/06/2011, 11:38 AM

## 2011-09-06 NOTE — H&P (Signed)
PCP: Marlowe Aschoff Cardiologist Dr. Carolanne Grumbling   Chief Complaint: Substernal chest pain, severe   HPI: [name] [name] is an 71 y.o. male with history of obesity, hypertension, sleep apnea, palpitation, status post right knee surgery approximately 4 months ago, asthma , presents to the emergency room about 2 hours after the onset of moderate to severe substernal chest pressure, followed by nausea but no vomiting, and diaphoresis. He also admitted to having a little bit of shortness of breath as well. He denied any epigastric discomfort. He has had no black stool bloody stool. 3 years ago, he had chest pain as well, and had a heart catheterization that was negative. In emergency room he was given GI cocktail which did not improve his chest pressure. Nitroglycerin, however improved markedly. Further evaluation included an EKG which showed no acute ST-T changes, and negative initial cardiac markers. His creatinine is normal.  Rewiew of Systems:  The patient denies anorexia, fever, weight loss,, vision loss, decreased hearing, hoarseness, syncope, dyspnea on exertion, , balance deficits, hemoptysis, abdominal pain, melena, hematochezia, severe indigestion/heartburn, hematuria, incontinence, genital sores, muscle weakness, suspicious skin lesions, transient blindness, difficulty walking, depression, unusual weight change, abnormal bleeding, enlarged lymph nodes, angioedema, and breast masses.    Past Medical History  Diagnosis Date  . Hypertension   . Dysrhythmia     palpitations, followed by Dr.Ehinger, seen in prep for surgery on 03/27/2011   . Angina     chest pain- cardiac cath. followed in 2010, record available ,  told then that he should f/u /w Dr. Manus Gunning    . Asthma     uses  singulair  . Pneumonia     hosp. 20 yrs. ago  . Sleep apnea     study done, 10 yrs. ago, told that he needed  CPAP but  never used   . Hepatitis     jaundice- many yrs. ago  . Chronic kidney disease    prostate cancer - surg. removal- 1996  . Cancer     prostate, melanoma- 2010, excision    . Arthritis     R knee, back      Past Surgical History  Procedure Date  . Cardiac catheterization     2010  . Tonsillectomy     as child  . Fracture surgery     L wrist, hardware- 1982  . Joint replacement     L knee, 2009  . Prostatectomy     for ca  . Total knee arthroplasty 04/22/2011    Procedure: TOTAL KNEE ARTHROPLASTY;  Surgeon: Valeria Batman, MD;  Location: [facility] OR;  Service: Orthopedics;  Laterality: Right;    Medications:  HOME MEDS: Prior to Admission medications   Medication Sig Start Date End Date Taking? Authorizing Provider  Calcium Carbonate-Vitamin D (CALCIUM + D) 600-200 MG-UNIT TABS Take 1 tablet by mouth 2 (two) times daily.   Yes Historical Provider, MD  fluticasone (FLONASE) 50 MCG/ACT nasal spray Place 2 sprays into the nose at bedtime.    Yes Historical Provider, MD  hydroxypropyl methylcellulose (ISOPTO TEARS) 2.5 % ophthalmic solution Place 1 drop into both eyes 3 (three) times daily as needed. For dry eyes   Yes Historical Provider, MD  ibuprofen (ADVIL,MOTRIN) 200 MG tablet Take 600 mg by mouth 2 (two) times daily.   Yes Historical Provider, MD  montelukast (SINGULAIR) 10 MG tablet Take 10 mg by mouth daily before breakfast.    Yes Historical Provider, MD  nebivolol (BYSTOLIC) 5  MG tablet Take 5 mg by mouth every morning.    Yes Historical Provider, MD  omeprazole (PRILOSEC) 40 MG capsule Take 40 mg by mouth daily.   Yes Historical Provider, MD  telmisartan (MICARDIS) 80 MG tablet Take 80 mg by mouth daily.   Yes Historical Provider, MD     Allergies:  Allergies  Allergen Reactions  . Aminophylline Anaphylaxis  . Lisinopril Cough  . Tenormin (Atenolol) Other (See Comments)    fatigue  . Zolpidem Tartrate Other (See Comments)    hallucinations    Social History:   reports that he quit smoking about 33 years ago. He does not have any smokeless  tobacco history on file. He reports that he does not drink alcohol or use illicit drugs.  Family History: Family History  Problem Relation Age of Onset  . Anesthesia problems Neg Hx   . Hypotension Neg Hx   . Malignant hyperthermia Neg Hx   . Pseudochol deficiency Neg Hx      Physical Exam: Filed Vitals:   09/05/11 2344 09/06/11 0035 09/06/11 0047 09/06/11 0219  BP: 214/95 190/78 152/74 140/58  Pulse: 60 63 63 54  Resp: 24  17 13   SpO2: 100% 97% 93% 96%   Blood pressure 140/58, pulse 54, resp. rate 13, SpO2 96.00%.  GEN:  Pleasantperson lying in the stretcher in no acute distress; cooperative with exam PSYCH:  alert and oriented x4; does not appear anxious or depressed; affect is appropriate. HEENT: Mucous membranes pink and anicteric; PERRLA; EOM intact; no cervical lymphadenopathy nor thyromegaly or carotid bruit; no JVD; Breasts:: Not examined CHEST WALL: No tenderness CHEST: Normal respiration, clear to auscultation bilaterally HEART: Regular rate and rhythm; no murmurs rubs or gallops BACK: No kyphosis or scoliosis; no CVA tenderness ABDOMEN: Obese, soft non-tender; no masses, no organomegaly, normal abdominal bowel sounds; no pannus; no intertriginous candida. Rectal Exam: Not done EXTREMITIES: No bone or joint deformity; age-appropriate arthropathy of the hands and knees; there is bilateral pedal edema; no ulcerations. No calf tenderness. Genitalia: not examined PULSES: 2+ and symmetric SKIN: Normal hydration no rash or ulceration CNS: Cranial nerves 2-12 grossly intact no focal lateralizing neurologic deficit   Labs & Imaging Results for orders placed during the hospital encounter of 09/05/11 (from the past 48 hour(s))  POCT I-STAT TROPONIN I     Status: Normal   Collection Time   09/06/11 12:31 AM      Component Value Range Comment   Troponin i, poc 0.01  0.00 - 0.08 ng/mL    Comment 3            POCT I-STAT, CHEM 8     Status: Abnormal   Collection Time    09/06/11 12:32 AM      Component Value Range Comment   Sodium 142  135 - 145 mEq/L    Potassium 3.6  3.5 - 5.1 mEq/L    Chloride 102  96 - 112 mEq/L    BUN 15  6 - 23 mg/dL    Creatinine, Ser 1.61  0.50 - 1.35 mg/dL    Glucose, Bld 096 (*) 70 - 99 mg/dL    Calcium, Ion 0.45 (*) 1.12 - 1.32 mmol/L    TCO2 26  0 - 100 mmol/L    Hemoglobin 12.9 (*) 13.0 - 17.0 g/dL    HCT 40.9 (*) 81.1 - 52.0 %    Dg Chest Portable 1 View  09/06/2011  *RADIOLOGY REPORT*  Clinical Data: Chest pain  PORTABLE  CHEST - 1 VIEW  Comparison: 04/11/2011  Findings: Heart size upper normal to mildly enlarged.  Mediastinal prominence may be exaggerated by technique.  Mild lingular scarring versus atelectasis, decreased.  No pleural effusion or pneumothorax.  Multilevel degenerative change without acute osseous finding.  IMPRESSION: Mildly prominent cardiomediastinal contours without radiographic evidence of acute cardiopulmonary process.  Original Report Authenticated By: Waneta Martins, M.D.      Assessment Present on Admission:  .Chest pain at rest .HTN (hypertension) .Obesity, Class II, BMI 35-39.9, with comorbidity .Asthma .Osteoarthritis of knee .Prostate carcinoma   PLAN:  This gentleman has significant risk factor for coronary disease, presents with substernal chest pain with nausea and diaphoresis, however initial troponin and EKG were unremarkable. His chest x-ray was clear as well. His story however is concerning that he might have had an unstable plaque rupture is an acute coronary syndrome. The initial troponin was only 2-3 hours since onset of his chest pain, and it could be too early for a positive result. His EKG is certainly reassuring. Also significant is GI cocktail did not touch his symptoms, but nitroglycerin markedly improved his chest pain. I will admit him to step down because of ongoing chest pain. We'll treat him with him with aspirin, continue his beta blocker, give morphine for his chest  pain, start his IV nitroglycerin, and to be prudent to start IV heparin until he ruled out. I have also consulted Dr. Wilber Bihari of cardiology service for further recommendation. He has relative bradycardia which could be from the beta blocker. He is stable, full code, and will be admitted to [facility].   Other plans as per orders.    Lula Michaux 09/06/2011, 2:41 AM

## 2011-09-07 DIAGNOSIS — I10 Essential (primary) hypertension: Secondary | ICD-10-CM

## 2011-09-07 DIAGNOSIS — R079 Chest pain, unspecified: Secondary | ICD-10-CM

## 2011-09-07 DIAGNOSIS — G4730 Sleep apnea, unspecified: Secondary | ICD-10-CM

## 2011-09-07 LAB — HEPARIN LEVEL (UNFRACTIONATED)
Heparin Unfractionated: 0.29 IU/mL — ABNORMAL LOW (ref 0.30–0.70)
Heparin Unfractionated: 0.39 IU/mL (ref 0.30–0.70)

## 2011-09-07 LAB — CBC
HCT: 36.3 % — ABNORMAL LOW (ref 39.0–52.0)
RDW: 14 % (ref 11.5–15.5)
WBC: 7.9 10*3/uL (ref 4.0–10.5)

## 2011-09-07 MED ORDER — HEPARIN (PORCINE) IN NACL 100-0.45 UNIT/ML-% IJ SOLN
1100.0000 [IU]/h | INTRAMUSCULAR | Status: DC
  Administered 2011-09-07 (×2): 1100 [IU]/h via INTRAVENOUS
  Filled 2011-09-07 (×3): qty 250

## 2011-09-07 MED ORDER — GUAIFENESIN-DM 100-10 MG/5ML PO SYRP
5.0000 mL | ORAL_SOLUTION | ORAL | Status: DC | PRN
  Administered 2011-09-07 – 2011-09-08 (×2): 5 mL via ORAL
  Filled 2011-09-07 (×2): qty 10

## 2011-09-07 NOTE — Progress Notes (Signed)
DAILY PROGRESS NOTE                              GENERAL INTERNAL MEDICINE [facility]  SUBJECTIVE: Denies any chest pain, no shortness of breath or palpitations.  OBJECTIVE: BP 136/64  Pulse 59  Temp 99 F (37.2 C) (Oral)  Resp 16  Ht 5\' 7"  (1.702 m)  Wt 113.7 kg (250 lb 10.6 oz)  BMI 39.26 kg/m2  SpO2 93%  Intake/Output Summary (Last 24 hours) at 09/07/11 1110 Last data filed at 09/07/11 1100  Gross per 24 hour  Intake 1067.8 ml  Output   2675 ml  Net -1607.2 ml                      Weight change: -3.4 kg (-7 lb 7.9 oz) Physical Exam: General: Alert and awake oriented x3 not in any acute distress. HEENT: anicteric sclera, pupils equal reactive to light and accommodation CVS: S1-S2 heard, no murmur rubs or gallops Chest: clear to auscultation bilaterally, no wheezing rales or rhonchi Abdomen:  normal bowel sounds, soft, nontender, nondistended, no organomegaly Neuro: Cranial nerves II-XII intact, no focal neurological deficits Extremities: no cyanosis, no clubbing or edema noted bilaterally   Lab Results:  Basename 09/06/11 0508 09/06/11 0032  NA 139 142  K 3.5 3.6  CL 104 102  CO2 30 --  GLUCOSE 98 108*  BUN 13 15  CREATININE 0.97 1.10  CALCIUM 8.7 --  MG -- --  PHOS -- --   No results found for this basename: AST:2,ALT:2,ALKPHOS:2,BILITOT:2,PROT:2,ALBUMIN:2 in the last 72 hours No results found for this basename: LIPASE:2,AMYLASE:2 in the last 72 hours  Basename 09/07/11 0338 09/06/11 0508  WBC 7.9 9.0  NEUTROABS -- --  HGB 12.5* 11.6*  HCT 36.3* 34.3*  MCV 87.1 86.2  PLT 142* 156    Basename 09/06/11 1919 09/06/11 1142 09/06/11 0508  CKTOTAL 40 31 36  CKMB 2.5 2.4 2.4  CKMBINDEX -- -- --  TROPONINI <0.30 <0.30 <0.30   No components found with this basename: POCBNP:3 No results found for this basename: DDIMER:2 in the last 72 hours No results found for this basename: HGBA1C:2 in the last 72 hours No results found for this basename:  CHOL:2,HDL:2,LDLCALC:2,TRIG:2,CHOLHDL:2,LDLDIRECT:2 in the last 72 hours  Basename 09/06/11 0508  TSH 2.365  T4TOTAL --  T3FREE --  THYROIDAB --   No results found for this basename: VITAMINB12:2,FOLATE:2,FERRITIN:2,TIBC:2,IRON:2,RETICCTPCT:2 in the last 72 hours  Micro Results: Recent Results (from the past 240 hour(s))  MRSA PCR SCREENING     Status: Normal   Collection Time   09/06/11  5:14 AM      Component Value Range Status Comment   MRSA by PCR NEGATIVE  NEGATIVE Final     Studies/Results: Dg Chest Portable 1 View  09/06/2011  *RADIOLOGY REPORT*  Clinical Data: Chest pain  PORTABLE CHEST - 1 VIEW  Comparison: 04/11/2011  Findings: Heart size upper normal to mildly enlarged.  Mediastinal prominence may be exaggerated by technique.  Mild lingular scarring versus atelectasis, decreased.  No pleural effusion or pneumothorax.  Multilevel degenerative change without acute osseous finding.  IMPRESSION: Mildly prominent cardiomediastinal contours without radiographic evidence of acute cardiopulmonary process.  Original Report Authenticated By: Waneta Martins, M.D.   Medications: Scheduled Meds:    . antiseptic oral rinse  15 mL Mouth Rinse BID  . aspirin EC  325 mg Oral Daily  . calcium-vitamin D  1 tablet Oral BID  . fluticasone  2 spray Each Nare QHS  . irbesartan  300 mg Oral Daily  . montelukast  10 mg Oral Daily  . nebivolol  5 mg Oral Daily  . pantoprazole  40 mg Oral Daily  . predniSONE  40 mg Oral Q breakfast  . senna  1 tablet Oral BID  . sodium chloride  3 mL Intravenous Q12H  . sodium chloride  3 mL Intravenous Q12H  . DISCONTD: predniSONE  10 mg Oral Q breakfast   Continuous Infusions:    . heparin 1,100 Units/hr (09/07/11 1100)  . nitroGLYCERIN Stopped (09/06/11 2000)  . DISCONTD: heparin 1,000 Units/hr (09/07/11 0100)   PRN Meds:.sodium chloride, acetaminophen, HYDROmorphone (DILAUDID) injection, nitroGLYCERIN, ondansetron (ZOFRAN) IV, ondansetron,  polyvinyl alcohol, sodium chloride  ASSESSMENT & PLAN: Active Problems:  Osteoarthritis of knee  Prostate carcinoma  Asthma  Obesity, Class II, BMI 35-39.9, with comorbidity  Chest pain at rest  HTN (hypertension)   Chest pain -Started at rest, lasted for about 2 hours and only relieved by nitroglycerin. -Reported normal cardiac catheterization in 2010. -Patient is heparin drip, aspirin and beta blockers. Was on nitroglycerin drip, discontinued last night secondary to headache and resolution of the chest pain. -Likely unstable angina, cardiology following. Plans for cardiac catheterization on Monday noted.  Hypertension -Continue his nebivolol and irbesartan, probably hold the latter on the day of the cardiac cath.  Acute bronchitis -He said he is in the middle of prednisone taper, this was restarted by cardiology.  Disposition -Remains in patient, still on heparin, but negative cardiac enzymes, will transfer to telemetry.   LOS: 2 days   [name] [name] A 09/07/2011, 11:10 AM

## 2011-09-07 NOTE — Progress Notes (Signed)
Patient transferring to room 1410, will travel by bed.  Report called to Boneta Lucks, [place].  Will continue to monitor.

## 2011-09-07 NOTE — Progress Notes (Signed)
ANTICOAGULATION CONSULT NOTE  Pharmacy Consult for heparin Indication: chest pain/ACS  Allergies  Allergen Reactions  . Aminophylline Anaphylaxis  . Lisinopril Cough  . Tenormin (Atenolol) Other (See Comments)    fatigue  . Zolpidem Tartrate Other (See Comments)    hallucinations    Patient Measurements: Height: 5\' 7"  (170.2 cm) Weight: 250 lb 10.6 oz (113.7 kg) IBW/kg (Calculated) : 66.1   Vital Signs: Temp: 99 F (37.2 C) (06/30 0400) Temp src: Oral (06/30 0400) BP: 147/65 mmHg (06/30 0500) Pulse Rate: 58  (06/30 0500)  Labs:  Alvira Philips 09/07/11 1610 09/06/11 2205 09/06/11 1919 09/06/11 1412 09/06/11 1142 09/06/11 0508 09/06/11 0032  HGB 12.5* -- -- -- -- 11.6* --  HCT 36.3* -- -- -- -- 34.3* 38.0*  PLT 142* -- -- -- -- 156 --  APTT -- -- -- -- -- -- --  LABPROT -- -- -- -- -- -- --  INR -- -- -- -- -- -- --  HEPARINUNFRC 0.29* 0.30 -- 0.34 -- -- --  CREATININE -- -- -- -- -- 0.97 1.10  CKTOTAL -- -- 40 -- 31 36 --  CKMB -- -- 2.5 -- 2.4 2.4 --  TROPONINI -- -- <0.30 -- <0.30 <0.30 --    Estimated Creatinine Clearance: 81.6 ml/min (by C-G formula based on Cr of 0.97).   Medications:  Infusions:     . heparin 1,000 Units/hr (09/07/11 0100)  . nitroGLYCERIN Stopped (09/06/11 2000)    Assessment:  33 YOM patient admit with chest pain on 6/29  First heparin levels therapeutic, but decreased to slightly below goal with HL 0.29  RN reported heparin drip was accidentally disconnected (< 90 min) and reconnected 6/29 at 2200.  Planned cardiac cath at [facility] on Monday, 7/1  CBC: Hgb stable, Platelets slightly decreased, follow  Goal of Therapy:  Heparin level 0.3-0.7 units/ml Monitor platelets by anticoagulation protocol: Yes   Plan:   Increase heparin IV infusion to 1100 units/hr  Heparin level in 8 hours  Daily heparin level and CBC   Lynann Beaver PharmD, BCPS Pager [phone] 09/07/2011 7:01 AM

## 2011-09-07 NOTE — Progress Notes (Signed)
Patient ID: [name] [name], male   DOB: March 05, 1941, 71 y.o.   MRN: [id number] Patient without further CP. Markers negative. Had headache and IV NTG discontinued last night. Still on BB, ASA and Heparin. Will make NPO tonight and arrange Cardiac cath in am. No history of contrast allergy. Discussed with patient and RN.

## 2011-09-07 NOTE — Progress Notes (Signed)
ANTICOAGULATION CONSULT NOTE  Pharmacy Consult for heparin Indication: chest pain/ACS  Allergies  Allergen Reactions  . Aminophylline Anaphylaxis  . Lisinopril Cough  . Tenormin (Atenolol) Other (See Comments)    fatigue  . Zolpidem Tartrate Other (See Comments)    hallucinations    Patient Measurements: Height: 5\' 7"  (170.2 cm) Weight: 248 lb (112.492 kg) IBW/kg (Calculated) : 66.1   Vital Signs: Temp: 97.7 F (36.5 C) (06/30 1631) Temp src: Oral (06/30 1631) BP: 167/60 mmHg (06/30 1631) Pulse Rate: 60  (06/30 1631)  Labs:  Alvira Philips 09/07/11 1555 09/07/11 0338 09/06/11 2205 09/06/11 1919 09/06/11 1142 09/06/11 0508 09/06/11 0032  HGB -- 12.5* -- -- -- 11.6* --  HCT -- 36.3* -- -- -- 34.3* 38.0*  PLT -- 142* -- -- -- 156 --  APTT -- -- -- -- -- -- --  LABPROT -- -- -- -- -- -- --  INR -- -- -- -- -- -- --  HEPARINUNFRC 0.39 0.29* 0.30 -- -- -- --  CREATININE -- -- -- -- -- 0.97 1.10  CKTOTAL -- -- -- 40 31 36 --  CKMB -- -- -- 2.5 2.4 2.4 --  TROPONINI -- -- -- <0.30 <0.30 <0.30 --    Estimated Creatinine Clearance: 81.3 ml/min (by C-G formula based on Cr of 0.97).   Medications:  Infusions:     . heparin 1,100 Units/hr (09/07/11 1600)  . nitroGLYCERIN Stopped (09/06/11 2000)  . DISCONTD: heparin 1,000 Units/hr (09/07/11 0100)    Assessment:  71 YOM patient admit with chest pain on 6/29  First heparin levels therapeutic, but decreased to slightly below goal with HL 0.29 this morning, HL now within therapeutic range  RN reported heparin drip was accidentally disconnected (< 90 min) and reconnected 6/29 at 2200.  Planned cardiac cath at [facility] on Monday, 7/1  CBC: Hgb stable, Platelets slightly decreased, follow  Goal of Therapy:  Heparin level 0.3-0.7 units/ml Monitor platelets by anticoagulation protocol: Yes   Plan:   Continue heparin IV infusion @ 1100 units/hr  Daily heparin level and CBC  Gwen Her PharmD    [phone] 09/07/2011 4:57 PM

## 2011-09-07 NOTE — Progress Notes (Signed)
ANTICOAGULATION CONSULT NOTE - Follow Up Consult  Pharmacy Consult for heparin Indication: chest pain/ACS  Allergies  Allergen Reactions  . Aminophylline Anaphylaxis  . Lisinopril Cough  . Tenormin (Atenolol) Other (See Comments)    fatigue  . Zolpidem Tartrate Other (See Comments)    hallucinations    Patient Measurements: Height: 5\' 7"  (170.2 cm) Weight: 250 lb 10.6 oz (113.7 kg) IBW/kg (Calculated) : 66.1  Heparin Dosing Weight:   Vital Signs: Temp: 98.7 F (37.1 C) (06/30 0000) Temp src: Oral (06/30 0000) BP: 148/60 mmHg (06/30 0100) Pulse Rate: 58  (06/30 0100)  Labs:  Basename 09/06/11 2205 09/06/11 1919 09/06/11 1412 09/06/11 1142 09/06/11 0508 09/06/11 0032  HGB -- -- -- -- 11.6* 12.9*  HCT -- -- -- -- 34.3* 38.0*  PLT -- -- -- -- 156 --  APTT -- -- -- -- -- --  LABPROT -- -- -- -- -- --  INR -- -- -- -- -- --  HEPARINUNFRC 0.30 -- 0.34 -- -- --  CREATININE -- -- -- -- 0.97 1.10  CKTOTAL -- 40 -- 31 36 --  CKMB -- 2.5 -- 2.4 2.4 --  TROPONINI -- <0.30 -- <0.30 <0.30 --    Estimated Creatinine Clearance: 81.6 ml/min (by C-G formula based on Cr of 0.97).   Medications:  Infusions:    . heparin 1,000 Units/hr (09/07/11 0100)  . nitroGLYCERIN Stopped (09/06/11 2000)  . DISCONTD: nitroGLYCERIN 5 mcg/min (09/06/11 1610)    Assessment: Patient with heparin drip at lowest level of goal.  Per RN the heparin iv line was disconnected for unknown length of time but less than .  IV line reconnected at 2200. No bleeding noted. Goal of Therapy:  Heparin level 0.3-0.7 units/ml Monitor platelets by anticoagulation protocol: Yes   Plan:  Will continue with drip at current rate.  Recheck level at 0500.  Darlina Guys, Jacquenette Shone Crowford 09/07/2011,2:47 AM

## 2011-09-08 ENCOUNTER — Encounter (HOSPITAL_COMMUNITY)
Admission: EM | Disposition: A | Discharge status: Home / Self Care | Payer: Self-pay | Source: Home / Self Care | Attending: Internal Medicine

## 2011-09-08 DIAGNOSIS — E669 Obesity, unspecified: Secondary | ICD-10-CM

## 2011-09-08 DIAGNOSIS — I10 Essential (primary) hypertension: Secondary | ICD-10-CM

## 2011-09-08 DIAGNOSIS — J45909 Unspecified asthma, uncomplicated: Secondary | ICD-10-CM

## 2011-09-08 HISTORY — PX: LEFT HEART CATHETERIZATION WITH CORONARY ANGIOGRAM: SHX5451

## 2011-09-08 LAB — HEPARIN LEVEL (UNFRACTIONATED): Heparin Unfractionated: 0.4 IU/mL (ref 0.30–0.70)

## 2011-09-08 LAB — CBC
HCT: 40.3 % (ref 39.0–52.0)
MCHC: 33.5 g/dL (ref 30.0–36.0)
Platelets: 166 10*3/uL (ref 150–400)
RDW: 13.6 % (ref 11.5–15.5)
WBC: 8.8 10*3/uL (ref 4.0–10.5)

## 2011-09-08 SURGERY — LEFT HEART CATHETERIZATION WITH CORONARY ANGIOGRAM
Anesthesia: LOCAL

## 2011-09-08 MED ORDER — FENTANYL CITRATE 0.05 MG/ML IJ SOLN
INTRAMUSCULAR | Status: AC
  Filled 2011-09-08: qty 2

## 2011-09-08 MED ORDER — VERAPAMIL HCL 2.5 MG/ML IV SOLN
INTRAVENOUS | Status: AC
  Filled 2011-09-08: qty 2

## 2011-09-08 MED ORDER — NITROGLYCERIN 0.2 MG/ML ON CALL CATH LAB
INTRAVENOUS | Status: AC
  Filled 2011-09-08: qty 1

## 2011-09-08 MED ORDER — SODIUM CHLORIDE 0.9 % IV SOLN: INTRAVENOUS | Status: AC

## 2011-09-08 MED ORDER — HEPARIN (PORCINE) IN NACL 2-0.9 UNIT/ML-% IJ SOLN
INTRAMUSCULAR | Status: AC
  Filled 2011-09-08: qty 2000

## 2011-09-08 MED ORDER — ACETAMINOPHEN 325 MG PO TABS: 650.0000 mg | ORAL_TABLET | ORAL | Status: DC | PRN

## 2011-09-08 MED ORDER — GUAIFENESIN-DM 100-10 MG/5ML PO SYRP
5.0000 mL | ORAL_SOLUTION | Freq: Once | ORAL | Status: AC
  Administered 2011-09-08: 5 mL via ORAL
  Filled 2011-09-08: qty 5

## 2011-09-08 MED ORDER — LIDOCAINE HCL (PF) 1 % IJ SOLN
INTRAMUSCULAR | Status: AC
  Filled 2011-09-08: qty 30

## 2011-09-08 MED ORDER — SODIUM CHLORIDE 0.9 % IJ SOLN
3.0000 mL | Freq: Two times a day (BID) | INTRAMUSCULAR | Status: DC
  Administered 2011-09-08: 3 mL via INTRAVENOUS

## 2011-09-08 MED ORDER — MIDAZOLAM HCL 2 MG/2ML IJ SOLN
INTRAMUSCULAR | Status: AC
  Filled 2011-09-08: qty 2

## 2011-09-08 MED ORDER — ASPIRIN 81 MG PO CHEW
324.0000 mg | CHEWABLE_TABLET | ORAL | Status: DC
  Filled 2011-09-08: qty 4

## 2011-09-08 MED ORDER — SODIUM CHLORIDE 0.9 % IV SOLN: 250.0000 mL | INTRAVENOUS | Status: DC | PRN

## 2011-09-08 MED ORDER — SODIUM CHLORIDE 0.9 % IJ SOLN: 3.0000 mL | INTRAMUSCULAR | Status: DC | PRN

## 2011-09-08 MED ORDER — ONDANSETRON HCL 4 MG/2ML IJ SOLN: 4.0000 mg | Freq: Four times a day (QID) | INTRAMUSCULAR | Status: DC | PRN

## 2011-09-08 MED ORDER — DIAZEPAM 5 MG PO TABS
5.0000 mg | ORAL_TABLET | ORAL | Status: AC
  Administered 2011-09-08: 5 mg via ORAL
  Filled 2011-09-08: qty 1

## 2011-09-08 MED ORDER — HYDROCOD POLST-CHLORPHEN POLST 10-8 MG/5ML PO LQCR
5.0000 mL | Freq: Two times a day (BID) | ORAL | Status: DC | PRN
  Administered 2011-09-08: 5 mL via ORAL
  Filled 2011-09-08: qty 5

## 2011-09-08 MED ORDER — SODIUM CHLORIDE 0.9 % IV SOLN: INTRAVENOUS | Status: DC

## 2011-09-08 MED ORDER — HEPARIN SODIUM (PORCINE) 1000 UNIT/ML IJ SOLN
INTRAMUSCULAR | Status: AC
  Filled 2011-09-08: qty 1

## 2011-09-08 NOTE — Progress Notes (Signed)
The patient's history and physical were reviewed. The patient was personally examined. His laboratory data and EKGs were reviewed. He has had a heart catheterization greater than 5 years ago that revealed "a leaky heart valve". He denies ongoing chest pain. Catheterization including the risks of stroke, MI, bleeding, death, limb ischemia, allergy, and kidney injury were discussed in detail and accepted by the patient.

## 2011-09-08 NOTE — Progress Notes (Signed)
ANTICOAGULATION CONSULT NOTE  Pharmacy Consult for heparin Indication: chest pain/ACS  Allergies  Allergen Reactions  . Aminophylline Anaphylaxis  . Lisinopril Cough  . Tenormin (Atenolol) Other (See Comments)    fatigue  . Zolpidem Tartrate Other (See Comments)    hallucinations    Patient Measurements: Height: 5\' 7"  (170.2 cm) Weight: 248 lb (112.492 kg) IBW/kg (Calculated) : 66.1   Vital Signs: Temp: 97.6 F (36.4 C) (07/01 0551) Temp src: Oral (07/01 0551) BP: 158/66 mmHg (07/01 0630) Pulse Rate: 60  (07/01 0551)  Labs:  Basename 09/08/11 0405 09/07/11 1555 09/07/11 0338 09/06/11 1919 09/06/11 1142 09/06/11 0508 09/06/11 0032  HGB 13.5 -- 12.5* -- -- -- --  HCT 40.3 -- 36.3* -- -- 34.3* --  PLT 166 -- 142* -- -- 156 --  APTT -- -- -- -- -- -- --  LABPROT -- -- -- -- -- -- --  INR -- -- -- -- -- -- --  HEPARINUNFRC 0.40 0.39 0.29* -- -- -- --  CREATININE -- -- -- -- -- 0.97 1.10  CKTOTAL -- -- -- 40 31 36 --  CKMB -- -- -- 2.5 2.4 2.4 --  TROPONINI -- -- -- <0.30 <0.30 <0.30 --    Estimated Creatinine Clearance: 81.3 ml/min (by C-G formula based on Cr of 0.97).   Medications:  Infusions:     . heparin 1,100 Units/hr (09/07/11 2143)  . nitroGLYCERIN Stopped (09/06/11 2000)    Assessment:  78 YOM patient admit with chest pain on 6/29  Heparin level within goal range with infusion at 1100 units/hr  Planned cardiac cath at [facility] today.  CBC stable.  Goal of Therapy:  Heparin level 0.3-0.7 units/ml Monitor platelets by anticoagulation protocol: Yes   Plan:   Continue heparin IV infusion @ 1100 units/hr  Daily heparin level and CBC  Clance Boll, PharmD, BCPS Pager: [phone] 09/08/2011 7:35 AM

## 2011-09-08 NOTE — Progress Notes (Signed)
DAILY PROGRESS NOTE                              [facility]  SUBJECTIVE: Denies any chest pain, no shortness of breath or palpitations.  OBJECTIVE: BP 158/66  Pulse 60  Temp 97.6 F (36.4 C) (Oral)  Resp 18  Ht 5\' 7"  (1.702 m)  Wt 112.492 kg (248 lb)  BMI 38.84 kg/m2  SpO2 92%  Intake/Output Summary (Last 24 hours) at 09/08/11 1234 Last data filed at 09/08/11 1214  Gross per 24 hour  Intake    957 ml  Output   3085 ml  Net  -2128 ml                      Weight change: -1.208 kg (-2 lb 10.6 oz) Physical Exam: General: Alert and awake oriented x3 not in any acute distress. HEENT: anicteric sclera, pupils equal reactive to light and accommodation CVS: S1-S2 heard, no murmur rubs or gallops Chest: clear to auscultation bilaterally, no wheezing rales or rhonchi Abdomen:  normal bowel sounds, soft, nontender, nondistended, no organomegaly Neuro: Cranial nerves II-XII intact, no focal neurological deficits Extremities: no cyanosis, no clubbing or edema noted bilaterally   Lab Results:  Basename 09/06/11 0508 09/06/11 0032  NA 139 142  K 3.5 3.6  CL 104 102  CO2 30 --  GLUCOSE 98 108*  BUN 13 15  CREATININE 0.97 1.10  CALCIUM 8.7 --  MG -- --  PHOS -- --   No results found for this basename: AST:2,ALT:2,ALKPHOS:2,BILITOT:2,PROT:2,ALBUMIN:2 in the last 72 hours No results found for this basename: LIPASE:2,AMYLASE:2 in the last 72 hours  Basename 09/08/11 0405 09/07/11 0338  WBC 8.8 7.9  NEUTROABS -- --  HGB 13.5 12.5*  HCT 40.3 36.3*  MCV 86.1 87.1  PLT 166 142*    Basename 09/06/11 1919 09/06/11 1142 09/06/11 0508  CKTOTAL 40 31 36  CKMB 2.5 2.4 2.4  CKMBINDEX -- -- --  TROPONINI <0.30 <0.30 <0.30   No components found with this basename: POCBNP:3 No results found for this basename: DDIMER:2 in the last 72 hours No results found for this basename: HGBA1C:2 in the last 72 hours No results found for this basename:  CHOL:2,HDL:2,LDLCALC:2,TRIG:2,CHOLHDL:2,LDLDIRECT:2 in the last 72 hours  Basename 09/06/11 0508  TSH 2.365  T4TOTAL --  T3FREE --  THYROIDAB --   No results found for this basename: VITAMINB12:2,FOLATE:2,FERRITIN:2,TIBC:2,IRON:2,RETICCTPCT:2 in the last 72 hours  Micro Results: Recent Results (from the past 240 hour(s))  MRSA PCR SCREENING     Status: Normal   Collection Time   09/06/11  5:14 AM      Component Value Range Status Comment   MRSA by PCR NEGATIVE  NEGATIVE Final     Studies/Results: Dg Chest Portable 1 View  09/06/2011  *RADIOLOGY REPORT*  Clinical Data: Chest pain  PORTABLE CHEST - 1 VIEW  Comparison: 04/11/2011  Findings: Heart size upper normal to mildly enlarged.  Mediastinal prominence may be exaggerated by technique.  Mild lingular scarring versus atelectasis, decreased.  No pleural effusion or pneumothorax.  Multilevel degenerative change without acute osseous finding.  IMPRESSION: Mildly prominent cardiomediastinal contours without radiographic evidence of acute cardiopulmonary process.  Original Report Authenticated By: Waneta Martins, M.D.   Medications: Scheduled Meds:    . antiseptic oral rinse  15 mL Mouth Rinse BID  . aspirin  324 mg Oral Pre-Cath  . aspirin  EC  325 mg Oral Daily  . calcium-vitamin D  1 tablet Oral BID  . diazepam  5 mg Oral On Call  . fluticasone  2 spray Each Nare QHS  . irbesartan  300 mg Oral Daily  . montelukast  10 mg Oral Daily  . nebivolol  5 mg Oral Daily  . pantoprazole  40 mg Oral Daily  . predniSONE  40 mg Oral Q breakfast  . senna  1 tablet Oral BID  . sodium chloride  3 mL Intravenous Q12H  . sodium chloride  3 mL Intravenous Q12H  . sodium chloride  3 mL Intravenous Q12H   Continuous Infusions:    . sodium chloride    . heparin 1,100 Units/hr (09/07/11 2143)  . nitroGLYCERIN Stopped (09/06/11 2000)   PRN Meds:.sodium chloride, sodium chloride, acetaminophen, guaiFENesin-dextromethorphan, HYDROmorphone  (DILAUDID) injection, nitroGLYCERIN, ondansetron (ZOFRAN) IV, ondansetron, polyvinyl alcohol, sodium chloride, sodium chloride  ASSESSMENT & PLAN: Active Problems:  Osteoarthritis of knee  Prostate carcinoma  Asthma  Obesity, Class II, BMI 35-39.9, with comorbidity  Chest pain at rest  HTN (hypertension)   Chest pain -Started at rest, lasted for about 2 hours and only relieved by nitroglycerin. -Reported normal cardiac catheterization in 2010. -Patient is heparin drip, aspirin and beta blockers. Was on nitroglycerin drip, discontinued last night secondary to headache and resolution of the chest pain. -Likely unstable angina, cardiology following. Plans for cardiac catheterization on Monday noted.  Hypertension -Continue his nebivolol and irbesartan, probably hold the latter on the day of the cardiac cath.  Acute bronchitis -He said he is in the middle of prednisone taper, this was restarted by cardiology.  Disposition -Remains in patient, cardiac catheterization today at [facility]. -Depending on the cath results, discharge today or early morning tomorrow, if it's okay with cardiology.   LOS: 3 days   Cinsere Mizrahi A 09/08/2011, 12:34 PM

## 2011-09-08 NOTE — Clinical Documentation Improvement (Signed)
GENERIC DOCUMENTATION CLARIFICATION QUERY  THIS DOCUMENT IS NOT A PERMANENT PART OF THE MEDICAL RECORD  TO RESPOND TO THE THIS QUERY, FOLLOW THE INSTRUCTIONS BELOW:  1. If needed, update documentation for the patient's encounter via the notes activity.  2. Access this query again and click edit on the In [employer].  3. After updating, or not, click F2 to complete all highlighted (required) fields concerning your review. Select "additional documentation in the medical record" OR "no additional documentation provided".  4. Click Sign note button.  5. The deficiency will fall out of your In Basket *Please let us know if you are not able to complete this workflow by phone or e-mail (listed below).  Please update your documentation within the medical record to reflect your response to this query.                                                                                        09/08/11   Dear Dr. Arthor Captain, [facility] / [facility],  In a better effort to capture your patient's severity of illness, reflect appropriate length of stay and utilization of resources, a review of the patient medical record has revealed the following indicators.    Based on your clinical judgment, please clarify and document in a progress note and/or discharge summary the clinical condition associated with the following supporting information:  In responding to this query please exercise your independent judgment.  The fact that a query is asked, does not imply that any particular answer is desired or expected.  Pt admitted with CP at rest.   Please clarify the underlying cause responsible for the chest and document in pn or d/c summary.  Thank you for all that you do for our patients!   Possible Clinical Conditions?  Chest Pain Pleuritic  Chest Pain w/Angina  Chest Pain, GI cause  Chest Pain, Musculoskeletal  _______Other Condition__________________ _______Cannot Clinically Determine   Supporting  Information:  Risk Factors: CP at rest, HTN, Obesity, Asthma, ACS, Hepatitis, H/O Angina, dysrhythmia,Acute bronchitis  Signs & Symptoms: HP Also significant is GI cocktail did not touch his symptoms, but nitroglycerin markedly improved his chest pain. I will admit him to step down because of ongoing chest pain  This gentleman has significant risk factor for coronary disease, presents with substernal chest pain with nausea and diaphoresis, however initial troponin and EKG were unremarkable. His chest x-ray was clear as well. His story however is concerning that he might have had an unstable plaque rupture is an acute coronary syndrome. The initial troponin was only 2-3 hours since onset of his chest pain, and it could be too early for a positive result. His EKG is certainly reassuring. Also significant is GI cocktail did not touch his symptoms, but nitroglycerin markedly improved his chest pain. I will admit him to step down because of ongoing chest pain.    PN 09/06/11 Started at rest, lasted for about 2 hours and only relieved by nitroglycerin    Diagnostics: PN  2 D Echo  09/05/11 LV EF: 60% - 65%   Treatment nitroGLYCERIN (NITROSTAT) SL tablet 0.4 mg    Cardiac Cath planned Monday  09/08/11 at [facility] may use possible, probable, or suspect with inpatient documentation. possible, probable, suspected diagnoses MUST be documented at the time of discharge  Reviewed:  no additional documentation provided  Thank You,  Enis Slipper  RN, BSN, CCDS Clinical Documentation Specialist [facility] Dept Pager: [phone] / E-mail: [email or web address]  Health Information Management [place]

## 2011-09-08 NOTE — CV Procedure (Signed)
     Diagnostic Cardiac Catheterization Report  [name] [name]  71 y.o.  male Mar 02, 1941  Procedure Date: 09/08/2011 Referring Physician: Blair Heys, M.D. Primary Cardiologist:: Armanda Magic, M.D.   PROCEDURE:  Left heart catheterization with selective coronary angiography, left ventriculogram.  INDICATIONS:  Clinical history compatible with angina. The patient is referred by Dr. Juanito Doom for coronary angiography  The risks, benefits, and details of the procedure were explained to the patient.  The patient verbalized understanding and wanted to proceed.  Informed written consent was obtained.  PROCEDURE TECHNIQUE:  After Xylocaine anesthesia a 5 French sheath was placed in the right radial l artery with a single anterior needle wall stick.   Coronary angiography was done using a 5 [place] A2 MP catheter.  Left ventriculography was done using the same catheter.    CONTRAST:  Total of 80 cc.  COMPLICATIONS:  None.    HEMODYNAMICS:  Aortic pressure was 156/73 mmHg; LV pressure was 156/15 mmHg; LVEDP 23 mmHg.  There was no gradient between the left ventricle and aorta.    ANGIOGRAPHIC DATA:   The left main coronary artery is  normal.  The left anterior descending artery is large vessel transapical, giving origin to 2 large diagonals. Luminal irregularities are noted in the second diagonal branch. No significant obstruction is noted at any point in the LAD. A region of systolic compression is noted in the distal portion of the LAD before the apical segment starts..  The left circumflex artery is relatively small and gives origin to one obtuse marginal. No obstruction is noted in the circumflex system.  The right coronary artery is the right coronary is dominant. Gives origin to the PDA and 2 left ventricular branches. No significant obstruction is noted.  LEFT VENTRICULOGRAM:  Left ventricular angiogram was done in the 30 RAO projection and revealed normal left ventricular wall  motion and systolic function with an estimated ejection fraction of 55 %.  LVEDP was 23 mmHg, which is mildly elevated.  IMPRESSIONS:  1. No significant obstructive coronary disease is noted. The right coronary contains diffuse irregularities proximal mid and distal vessel. The left coronary is essentially normal.  2. Normal left ventricular systolic function.  3. Possible aortic root enlargement   RECOMMENDATION:  No further ischemic evaluation is necessary. Consider an echocardiogram if not already done to evaluate aortic root size.

## 2011-09-09 MED ORDER — NITROGLYCERIN 0.3 MG SL SUBL: 0.3000 mg | SUBLINGUAL_TABLET | SUBLINGUAL | Status: DC | PRN

## 2011-09-09 MED ORDER — ASPIRIN 81 MG PO TBEC: 81.0000 mg | DELAYED_RELEASE_TABLET | Freq: Every day | ORAL | Status: AC

## 2011-09-09 NOTE — Progress Notes (Signed)
Discharge home, no complaints of any pain or discomfort, wife at bedside.Discharge instructions  And follow up appointments done and given to the wife and verbalized understanding.PIV removed no s/s of swelling no infiltration noted.

## 2011-09-09 NOTE — Discharge Summary (Signed)
HOSPITAL DISCHARGE SUMMARY  [name] [name]  MRN: [id number]  DOB:[birth year]  Date of Admission: 09/05/2011 Date of Discharge: 09/09/2011         LOS: 4 days   Attending Physician:  Clydia Llano A  Patient's PCP:  No primary provider on file.  Consults: Verdis Prime with [facility] cardiology  Discharge Diagnoses: Present on Admission:  .Chest pain at rest .HTN (hypertension) .Obesity, Class II, BMI 35-39.9, with comorbidity .Asthma .Osteoarthritis of knee .Prostate carcinoma   Medication List  As of 09/09/2011  5:38 PM   TAKE these medications         aspirin 81 MG EC tablet   Take 1 tablet (81 mg total) by mouth daily. Swallow whole.      Calcium + D 600-200 MG-UNIT Tabs   Generic drug: Calcium Carbonate-Vitamin D   Take 1 tablet by mouth 2 (two) times daily.      fluticasone 50 MCG/ACT nasal spray   Commonly known as: FLONASE   Place 2 sprays into the nose at bedtime.      hydroxypropyl methylcellulose 2.5 % ophthalmic solution   Commonly known as: ISOPTO TEARS   Place 1 drop into both eyes 3 (three) times daily as needed. For dry eyes      ibuprofen 200 MG tablet   Commonly known as: ADVIL,MOTRIN   Take 600 mg by mouth 2 (two) times daily.      montelukast 10 MG tablet   Commonly known as: SINGULAIR   Take 10 mg by mouth daily before breakfast.      nebivolol 5 MG tablet   Commonly known as: BYSTOLIC   Take 5 mg by mouth every morning.      nitroGLYCERIN 0.3 MG SL tablet   Commonly known as: NITROSTAT   Place 1 tablet (0.3 mg total) under the tongue every 5 (five) minutes as needed for chest pain.      omeprazole 40 MG capsule   Commonly known as: PRILOSEC   Take 40 mg by mouth daily.      telmisartan 80 MG tablet   Commonly known as: MICARDIS   Take 80 mg by mouth daily.             Brief Admission History: [name] [name] is an 71 y.o. male with history of obesity, hypertension, sleep apnea, palpitation, status post right knee surgery  approximately 4 months ago, asthma , presents to the emergency room about 2 hours after the onset of moderate to severe substernal chest pressure, followed by nausea but no vomiting, and diaphoresis. He also admitted to having a little bit of shortness of breath as well. He denied any epigastric discomfort. He has had no black stool bloody stool. 3 years ago, he had chest pain as well, and had a heart catheterization that was negative. In emergency room he was given GI cocktail which did not improve his chest pressure. Nitroglycerin, however improved markedly. Further evaluation included an EKG which showed no acute ST-T changes, and negative initial cardiac markers. His creatinine is normal.  Hospital Course: Present on Admission:  .Chest pain at rest .HTN (hypertension) .Obesity, Class II, BMI 35-39.9, with comorbidity .Asthma .Osteoarthritis of knee .Prostate carcinoma  1. Chest pain: Likely had unstable angina, patient admitted with typical chest pain. Which is relieved in the emergency department with nitroglycerin. The first set of cardiac enzymes and EKG were negative for ischemic events. So patient was placed on heparin drip and nitroglycerin drip. He was placed on the step  down ICU bed, cardiology was consulted. [place] cardiology was on call for it all and they evaluated the patient recommended cardiac catheterization. (Patient has clean coronaries from cardiac cath in 2010). Patient undergone the cardiac catheterization on Monday, 09/08/2011, and showed no evidence of coronary artery lesions with normal ejection fraction. Dr. Katrinka Blazing recommended to get 2-D echocardiogram for possible aortic root dilation. 2-D echocardiogram was done and did not show any aortic root dilation, ejection fraction was about 63% and grade 1 diastolic dysfunction. At the time of discharge patient was placed on low-dose aspirin, and was given sublingual nitroglycerin as needed for chest pain.  2. Hypertension: Patient  is on nebivolol and irbesartan, this being continued throughout the hospital stay. His ARB was held on the day of the cardiac catheterization that was restarted the day of discharge. His blood pressure was reasonably controlled during this hospital stay.  3. Acute bronchitis: Patient was completing treatment for acute bronchitis, he was in the middle of prednisone taper this was continued in the hospital at the time of discharge as well.   Day of Discharge BP 148/80  Pulse 61  Temp 98 F (36.7 C) (Oral)  Resp 18  Ht 5\' 7"  (1.702 m)  Wt 112.492 kg (248 lb)  BMI 38.84 kg/m2  SpO2 94% Physical Exam:  General: Alert, disoriented not in any acute distress.  HEENT: anicteric sclera, pupils equal reactive to light and accommodation  CVS: S1-S2 heard, no murmur rubs or gallops  Chest: clear to auscultation bilaterally, no wheezing rales or rhonchi  Abdomen: normal bowel sounds, soft, nontender, nondistended, no organomegaly  Neuro: Cranial nerves II-XII intact, no focal neurological deficits  Extremities: no cyanosis, no clubbing or edema noted bilaterally  No results found for this or any previous visit (from the past 24 hour(s)).  Disposition: Home   Follow-up Appts: Discharge Orders    Future Orders Please Complete By Expires   Diet - low sodium heart healthy      Increase activity slowly         Follow-up Information    Follow up with Lesleigh Noe, MD in 1 week.   Contact information:   [address] [place] [address] [phone]          I spent 40 minutes completing paperwork and coordinating discharge efforts.  SignedClydia Llano A 09/09/2011, 5:38 PM

## 2011-09-09 NOTE — Care Management Note (Signed)
    Page 1 of 1   09/09/2011     11:47:08 AM   CARE MANAGEMENT NOTE 09/09/2011  Patient:  [name] [name], [name] [name]   Account Number:  [id number]  Date Initiated:  09/09/2011  Documentation initiated by:  Lanier Clam  Subjective/Objective Assessment:   ADMITTED W/CHEST PAIN.     Action/Plan:   FROM HOME   Anticipated DC Date:  09/09/2011   Anticipated DC Plan:  HOME/SELF CARE      DC Planning Services  CM consult      Choice offered to / List presented to:             Status of service:  Completed, signed off Medicare Important Message given?   (If response is "NO", the following Medicare IM given date fields will be blank) Date Medicare IM given:   Date Additional Medicare IM given:    Discharge Disposition:  HOME/SELF CARE  Per UR Regulation:  Reviewed for med. necessity/level of care/duration of stay  If discussed at Long Length of Stay Meetings, dates discussed:    Comments:  09/09/11 [place] RN,BSN NCM [phone]

## 2013-02-21 ENCOUNTER — Other Ambulatory Visit: Payer: Self-pay | Admitting: Dermatology

## 2013-07-14 ENCOUNTER — Ambulatory Visit
Admission: RE | Admit: 2013-07-14 | Discharge: 2013-07-14 | Disposition: A | Discharge status: Home / Self Care | Payer: Medicare Other | Source: Ambulatory Visit | Attending: Family Medicine | Admitting: Family Medicine

## 2013-07-14 ENCOUNTER — Other Ambulatory Visit: Payer: Self-pay | Admitting: Family Medicine

## 2013-07-14 DIAGNOSIS — R0600 Dyspnea, unspecified: Secondary | ICD-10-CM

## 2013-07-15 ENCOUNTER — Other Ambulatory Visit (HOSPITAL_COMMUNITY): Payer: Self-pay

## 2013-07-15 DIAGNOSIS — J45909 Unspecified asthma, uncomplicated: Secondary | ICD-10-CM

## 2013-07-21 ENCOUNTER — Ambulatory Visit (HOSPITAL_COMMUNITY)
Admission: RE | Admit: 2013-07-21 | Discharge: 2013-07-21 | Disposition: A | Discharge status: Home / Self Care | Payer: Medicare Other | Source: Ambulatory Visit | Attending: Family Medicine | Admitting: Family Medicine

## 2013-07-21 DIAGNOSIS — J45909 Unspecified asthma, uncomplicated: Secondary | ICD-10-CM | POA: Insufficient documentation

## 2013-07-21 MED ORDER — ALBUTEROL SULFATE (2.5 MG/3ML) 0.083% IN NEBU
2.5000 mg | INHALATION_SOLUTION | Freq: Once | RESPIRATORY_TRACT | Status: AC
  Administered 2013-07-21: 2.5 mg via RESPIRATORY_TRACT

## 2013-07-28 LAB — PULMONARY FUNCTION TEST
DL/VA % pred: 113 %
DL/VA: 4.98 ml/min/mmHg/L
DLCO COR % PRED: 99 %
DLCO UNC % PRED: 99 %
DLCO UNC: 28.25 ml/min/mmHg
DLCO cor: 28.25 ml/min/mmHg
FEF 25-75 POST: 2.11 L/s
FEF 25-75 Pre: 1.15 L/sec
FEF2575-%Change-Post: 83 %
FEF2575-%PRED-POST: 108 %
FEF2575-%PRED-PRE: 59 %
FEV1-%Change-Post: 17 %
FEV1-%Pred-Post: 98 %
FEV1-%Pred-Pre: 83 %
FEV1-POST: 2.65 L
FEV1-PRE: 2.26 L
FEV1FVC-%CHANGE-POST: 7 %
FEV1FVC-%PRED-PRE: 89 %
FEV6-%CHANGE-POST: 9 %
FEV6-%PRED-PRE: 97 %
FEV6-%Pred-Post: 107 %
FEV6-POST: 3.74 L
FEV6-Pre: 3.42 L
FEV6FVC-%CHANGE-POST: 0 %
FEV6FVC-%PRED-POST: 106 %
FEV6FVC-%Pred-Pre: 105 %
FVC-%CHANGE-POST: 9 %
FVC-%PRED-POST: 101 %
FVC-%Pred-Pre: 92 %
FVC-PRE: 3.47 L
FVC-Post: 3.81 L
PRE FEV1/FVC RATIO: 65 %
Post FEV1/FVC ratio: 70 %
Post FEV6/FVC ratio: 99 %
Pre FEV6/FVC Ratio: 99 %
RV % pred: 111 %
RV: 2.66 L
TLC % pred: 99 %
TLC: 6.4 L

## 2013-08-02 ENCOUNTER — Other Ambulatory Visit: Payer: Self-pay | Admitting: Orthopaedic Surgery

## 2013-08-02 DIAGNOSIS — M25511 Pain in right shoulder: Secondary | ICD-10-CM

## 2013-08-07 ENCOUNTER — Ambulatory Visit
Admission: RE | Admit: 2013-08-07 | Discharge: 2013-08-07 | Disposition: A | Discharge status: Home / Self Care | Payer: Medicare Other | Source: Ambulatory Visit | Attending: Orthopaedic Surgery | Admitting: Orthopaedic Surgery

## 2013-08-07 DIAGNOSIS — M25511 Pain in right shoulder: Secondary | ICD-10-CM

## 2013-08-12 ENCOUNTER — Other Ambulatory Visit: Payer: Self-pay | Admitting: Orthopaedic Surgery

## 2013-08-12 DIAGNOSIS — M25511 Pain in right shoulder: Secondary | ICD-10-CM

## 2013-08-23 ENCOUNTER — Ambulatory Visit
Admission: RE | Admit: 2013-08-23 | Discharge: 2013-08-23 | Disposition: A | Discharge status: Home / Self Care | Payer: Medicare Other | Source: Ambulatory Visit | Attending: Orthopaedic Surgery | Admitting: Orthopaedic Surgery

## 2013-08-23 DIAGNOSIS — M25511 Pain in right shoulder: Secondary | ICD-10-CM

## 2013-08-23 MED ORDER — IOHEXOL 180 MG/ML  SOLN: 12.0000 mL | Freq: Once | INTRAMUSCULAR | Status: AC | PRN

## 2013-10-05 ENCOUNTER — Encounter: Payer: Self-pay | Admitting: *Deleted

## 2014-02-16 ENCOUNTER — Encounter (HOSPITAL_COMMUNITY): Payer: Self-pay | Admitting: Interventional Cardiology

## 2014-04-12 DIAGNOSIS — C4A9 Merkel cell carcinoma, unspecified: Secondary | ICD-10-CM | POA: Insufficient documentation

## 2014-05-05 DIAGNOSIS — R0989 Other specified symptoms and signs involving the circulatory and respiratory systems: Secondary | ICD-10-CM | POA: Insufficient documentation

## 2014-05-05 DIAGNOSIS — R29818 Other symptoms and signs involving the nervous system: Secondary | ICD-10-CM | POA: Insufficient documentation

## 2014-08-29 ENCOUNTER — Other Ambulatory Visit: Payer: Self-pay | Admitting: Gastroenterology

## 2014-10-12 ENCOUNTER — Other Ambulatory Visit: Payer: Self-pay | Admitting: Orthopaedic Surgery

## 2014-10-12 DIAGNOSIS — M545 Low back pain: Secondary | ICD-10-CM

## 2014-10-24 ENCOUNTER — Ambulatory Visit
Admission: RE | Admit: 2014-10-24 | Discharge: 2014-10-24 | Disposition: A | Discharge status: Home / Self Care | Payer: Medicare Other | Source: Ambulatory Visit | Attending: Orthopaedic Surgery | Admitting: Orthopaedic Surgery

## 2014-10-24 DIAGNOSIS — M545 Low back pain: Secondary | ICD-10-CM

## 2014-11-23 DIAGNOSIS — R002 Palpitations: Secondary | ICD-10-CM | POA: Insufficient documentation

## 2014-11-23 DIAGNOSIS — L570 Actinic keratosis: Secondary | ICD-10-CM | POA: Insufficient documentation

## 2015-01-23 ENCOUNTER — Encounter: Payer: Self-pay | Admitting: Interventional Cardiology

## 2015-01-23 ENCOUNTER — Ambulatory Visit (INDEPENDENT_AMBULATORY_CARE_PROVIDER_SITE_OTHER): Payer: Medicare Other | Admitting: Interventional Cardiology

## 2015-01-23 VITALS — BP 148/74 | HR 60 | Ht 67.0 in | Wt 249.2 lb

## 2015-01-23 DIAGNOSIS — I10 Essential (primary) hypertension: Secondary | ICD-10-CM | POA: Diagnosis not present

## 2015-01-23 DIAGNOSIS — R079 Chest pain, unspecified: Secondary | ICD-10-CM

## 2015-01-23 DIAGNOSIS — R5383 Other fatigue: Secondary | ICD-10-CM

## 2015-01-23 DIAGNOSIS — R002 Palpitations: Secondary | ICD-10-CM

## 2015-01-23 DIAGNOSIS — IMO0001 Reserved for inherently not codable concepts without codable children: Secondary | ICD-10-CM

## 2015-01-23 DIAGNOSIS — G4733 Obstructive sleep apnea (adult) (pediatric): Secondary | ICD-10-CM

## 2015-01-23 NOTE — Patient Instructions (Signed)
Medication Instructions:  Your physician recommends that you continue on your current medications as directed. Please refer to the Current Medication list given to you today.   Labwork: None ordered  Testing/Procedures: Your physician has recommended that you wear a holter monitor. Holter monitors are medical devices that record the heart's electrical activity. Doctors most often use these monitors to diagnose arrhythmias. Arrhythmias are problems with the speed or rhythm of the heartbeat. The monitor is a small, portable device. You can wear one while you do your normal daily activities. This is usually used to diagnose what is causing palpitations/syncope (passing out).  Your physician has requested that you have an echocardiogram. Echocardiography is a painless test that uses sound waves to create images of your heart. It provides your doctor with information about the size and shape of your heart and how well your heart's chambers and valves are working. This procedure takes approximately one hour. There are no restrictions for this procedure.  Your physician has recommended that you have a sleep study. This test records several body functions during sleep, including: brain activity, eye movement, oxygen and carbon dioxide blood levels, heart rate and rhythm, breathing rate and rhythm, the flow of air through your mouth and nose, snoring, body muscle movements, and chest and belly movement.    Follow-Up: Your physician recommends that you schedule a follow-up appointment as needed pending results   Any Other Special Instructions Will Be Listed Below (If Applicable).     If you need a refill on your cardiac medications before your next appointment, please call your pharmacy.

## 2015-01-23 NOTE — Progress Notes (Signed)
Cardiology Office Note   Date:  01/23/2015   ID:  [name] [name] 02-23-1941, MRN [id number]  PCP:  Simona Huh, MD  Cardiologist:  Sinclair Grooms, MD   Chief Complaint  Patient presents with  . Fatigue      History of Present Illness: [name] [name] is a 74 y.o. male who presents for untreated sleep apnea, essential hypertension, dyspnea, and exertional fatigue.  The patient has had prior cardiac evaluation including coronary angiography in 2013 which revealed widely patent coronary arteries. Symptoms at that time were vague but included chest pain.  The major complaint on this occasion his exertional fatigue, excessive daytime sleepiness, breathlessness with physical activity, and lower extremity swelling. He denies chest discomfort. He has multiple other somatic complaints.  With further questioning I find that he has had a sleep study remotely but has refused therapy.  Past Medical History  Diagnosis Date  . Hypertension   . Dysrhythmia     palpitations, followed by Dr.Ehinger, seen in prep for surgery on 03/27/2011   . Angina     chest pain- cardiac cath. followed in 2010, record available ,  told then that he should f/u /w Dr. Marisue Humble    . Asthma     uses  singulair  . Pneumonia     hosp. 20 yrs. ago  . Sleep apnea     study done, 10 yrs. ago, told that he needed  CPAP but  never used   . Hepatitis     jaundice- many yrs. ago  . Chronic kidney disease     prostate cancer - surg. removal- 1996  . Cancer [facility])     prostate, melanoma- 2010, excision    . Arthritis     R knee, back    . Hiatal hernia   . GERD (gastroesophageal reflux disease)   . Prostate cancer ([place]) 1995  . Melanoma ([place])     Face  . Diverticulitis   . Colon polyps     Past Surgical History  Procedure Laterality Date  . Cardiac catheterization      2010  . Tonsillectomy      as child  . Fracture surgery      L wrist, hardware- 1982  . Joint replacement      L  knee, 2009  . Prostatectomy      for ca  . Total knee arthroplasty  04/22/2011    Procedure: TOTAL KNEE ARTHROPLASTY;  Surgeon: Garald Balding, MD;  Location: [facility];  Service: Orthopedics;  Laterality: Right;  . Left heart catheterization with coronary angiogram N/A 09/08/2011    Procedure: LEFT HEART CATHETERIZATION WITH CORONARY ANGIOGRAM;  Surgeon: Sinclair Grooms, MD;  Location: [facility] CATH LAB;  Service: Cardiovascular;  Laterality: N/A;     Current Outpatient Prescriptions  Medication Sig Dispense Refill  . Calcium Carbonate-Vitamin D (CALCIUM + D) 600-200 MG-UNIT TABS Take 1 tablet by mouth 2 (two) times daily.    . fluticasone (FLONASE) 50 MCG/ACT nasal spray Place 2 sprays into the nose at bedtime.     . hydroxypropyl methylcellulose (ISOPTO TEARS) 2.5 % ophthalmic solution Place 1 drop into both eyes 3 (three) times daily as needed. For dry eyes    . ibuprofen (ADVIL,MOTRIN) 200 MG tablet Take 600 mg by mouth 2 (two) times daily as needed. (pain)    . montelukast (SINGULAIR) 10 MG tablet Take 10 mg by mouth daily before breakfast.     . Multiple  Vitamins tablet Take 1 tablet by mouth daily.    . Omega-3 Fatty Acids (FISH OIL) 1000 MG CAPS Take 1,000 mg by mouth 2 (two) times daily.    Marland Kitchen omeprazole (PRILOSEC) 40 MG capsule Take 40 mg by mouth daily.    Marland Kitchen telmisartan (MICARDIS) 80 MG tablet Take 80 mg by mouth daily.     No current facility-administered medications for this visit.    Allergies:   Aminophylline; Ampicillin; Carvedilol; Lisinopril; Tenormin; and Zolpidem tartrate    Social History:  The patient  reports that he quit smoking about 31 years ago. He has never used smokeless tobacco. He reports that he does not drink alcohol or use illicit drugs.   Family History:  The patient's family history includes Alzheimer's disease in his father; Breast cancer in his mother; Crohn's disease in his brother; Heart disease in his brother; Hypertension in his brother. There is no  history of Anesthesia problems, Hypotension, Malignant hyperthermia, or Pseudochol deficiency.    ROS:  Please see the history of present illness.   Otherwise, review of systems are positive for difficulty with anxiety, palpitations, orthopnea, snoring, lower externally swelling, cough, back discomfort, and dizziness..   All other systems are reviewed and negative.    PHYSICAL EXAM: VS:  BP 148/74 mmHg  Pulse 60  Ht '5\' 7"'$  (1.702 m)  Wt 113.036 kg (249 lb 3.2 oz)  BMI 39.02 kg/m2 , BMI Body mass index is 39.02 kg/(m^2). GEN: Well nourished, well developed, in no acute distress HEENT: normal Neck: no JVD, carotid bruits, or masses Cardiac: R RR.  There 1/6 systolic murmur. There is no rub, gallop, or click. There is 2+ bilateral ankle to mid shin pitting edema. Respiratory:  clear to auscultation bilaterally, normal work of breathing. GI: soft, nontender, nondistended, + BS MS: no deformity or atrophy Skin: warm and dry, no rash Neuro:  Strength and sensation are intact Psych: euthymic mood, full affect   EKG:  EKG is ordered today. The ekg reveals sinus bradycardia at 60 bpm. Otherwise unremarkable other than left axis deviation.   Recent Labs: No results found for requested labs within last 365 days.    Lipid Panel No results found for: CHOL, TRIG, HDL, CHOLHDL, VLDL, LDLCALC, LDLDIRECT    Wt Readings from Last 3 Encounters:  01/23/15 113.036 kg (249 lb 3.2 oz)  09/07/11 112.492 kg (248 lb)  04/23/11 111.857 kg (246 lb 9.6 oz)      Other studies Reviewed: Additional studies/ records that were reviewed today include: Old records were reviewed. The findings include coronary angina 2013 with widely patent coronary arteries..    ASSESSMENT AND PLAN:  1. Chest pain at rest Noncardiac in origin   2. Essential hypertension Borderline control  3. Obesity, Class II, BMI 35-39.9, with comorbidity ([place]) Morbid and needs attention  4. Obstructive sleep apnea Apparently  there is a previous diagnosis but the patient has refused therapy. This may be the root of all the patient's current symptoms including the possibility of pulmonary hypertension causing peripheral edema.  5. Other fatigue Likely related to sleep apnea, untreated. Rule out pulmonary hypertension.  6. Palpitations Exclude the possibility of atrial fibrillation or other arrhythmia including bradycardia arrhythmias a good account for the patient's complaints.     Current medicines are reviewed at length with the patient today.  The patient has the following concerns regarding medicines: None.  The following changes/actions have been instituted:    Sleep study  48 hour Holter monitor  2-D Doppler echocardiogram  Labs/ tests ordered today include:   Orders Placed This Encounter  Procedures  . Holter monitor - 48 hour  . Echocardiogram  . Split night study     Disposition:   FU with HS in when necessary follow-up based upon findings of workup   Signed, Sinclair Grooms, MD  01/23/2015 1:29 PM    [facility] [address], [place], [place]  [address] Phone: [phone]; Fax: [phone]

## 2015-01-25 ENCOUNTER — Observation Stay (HOSPITAL_COMMUNITY)
Admission: EM | Admit: 2015-01-25 | Discharge: 2015-01-26 | Disposition: A | Discharge status: Home / Self Care | Payer: Medicare Other | Attending: Internal Medicine | Admitting: Internal Medicine

## 2015-01-25 ENCOUNTER — Emergency Department (HOSPITAL_COMMUNITY): Payer: Medicare Other

## 2015-01-25 ENCOUNTER — Encounter (HOSPITAL_COMMUNITY): Payer: Self-pay | Admitting: Emergency Medicine

## 2015-01-25 DIAGNOSIS — I480 Paroxysmal atrial fibrillation: Secondary | ICD-10-CM | POA: Diagnosis present

## 2015-01-25 DIAGNOSIS — N189 Chronic kidney disease, unspecified: Secondary | ICD-10-CM | POA: Diagnosis not present

## 2015-01-25 DIAGNOSIS — G4733 Obstructive sleep apnea (adult) (pediatric): Secondary | ICD-10-CM | POA: Diagnosis not present

## 2015-01-25 DIAGNOSIS — Z8546 Personal history of malignant neoplasm of prostate: Secondary | ICD-10-CM | POA: Diagnosis not present

## 2015-01-25 DIAGNOSIS — I129 Hypertensive chronic kidney disease with stage 1 through stage 4 chronic kidney disease, or unspecified chronic kidney disease: Secondary | ICD-10-CM | POA: Insufficient documentation

## 2015-01-25 DIAGNOSIS — Z791 Long term (current) use of non-steroidal anti-inflammatories (NSAID): Secondary | ICD-10-CM | POA: Diagnosis not present

## 2015-01-25 DIAGNOSIS — K219 Gastro-esophageal reflux disease without esophagitis: Secondary | ICD-10-CM | POA: Diagnosis not present

## 2015-01-25 DIAGNOSIS — K759 Inflammatory liver disease, unspecified: Secondary | ICD-10-CM | POA: Diagnosis not present

## 2015-01-25 DIAGNOSIS — Z7951 Long term (current) use of inhaled steroids: Secondary | ICD-10-CM | POA: Diagnosis not present

## 2015-01-25 DIAGNOSIS — M179 Osteoarthritis of knee, unspecified: Secondary | ICD-10-CM | POA: Insufficient documentation

## 2015-01-25 DIAGNOSIS — C439 Malignant melanoma of skin, unspecified: Secondary | ICD-10-CM | POA: Insufficient documentation

## 2015-01-25 DIAGNOSIS — Z85820 Personal history of malignant melanoma of skin: Secondary | ICD-10-CM | POA: Insufficient documentation

## 2015-01-25 DIAGNOSIS — M479 Spondylosis, unspecified: Secondary | ICD-10-CM | POA: Diagnosis not present

## 2015-01-25 DIAGNOSIS — G4730 Sleep apnea, unspecified: Secondary | ICD-10-CM | POA: Diagnosis not present

## 2015-01-25 DIAGNOSIS — R600 Localized edema: Secondary | ICD-10-CM | POA: Insufficient documentation

## 2015-01-25 DIAGNOSIS — I4891 Unspecified atrial fibrillation: Secondary | ICD-10-CM | POA: Diagnosis not present

## 2015-01-25 DIAGNOSIS — Z87891 Personal history of nicotine dependence: Secondary | ICD-10-CM | POA: Insufficient documentation

## 2015-01-25 DIAGNOSIS — R079 Chest pain, unspecified: Secondary | ICD-10-CM | POA: Diagnosis present

## 2015-01-25 DIAGNOSIS — J45909 Unspecified asthma, uncomplicated: Secondary | ICD-10-CM | POA: Diagnosis present

## 2015-01-25 DIAGNOSIS — R609 Edema, unspecified: Secondary | ICD-10-CM

## 2015-01-25 DIAGNOSIS — Z79899 Other long term (current) drug therapy: Secondary | ICD-10-CM | POA: Diagnosis not present

## 2015-01-25 DIAGNOSIS — Z96651 Presence of right artificial knee joint: Secondary | ICD-10-CM | POA: Insufficient documentation

## 2015-01-25 DIAGNOSIS — R002 Palpitations: Secondary | ICD-10-CM | POA: Diagnosis not present

## 2015-01-25 DIAGNOSIS — K449 Diaphragmatic hernia without obstruction or gangrene: Secondary | ICD-10-CM | POA: Diagnosis not present

## 2015-01-25 LAB — I-STAT CHEM 8, ED
BUN: 18 mg/dL (ref 6–20)
CREATININE: 1.2 mg/dL (ref 0.61–1.24)
Calcium, Ion: 1.18 mmol/L (ref 1.13–1.30)
Chloride: 106 mmol/L (ref 101–111)
Glucose, Bld: 138 mg/dL — ABNORMAL HIGH (ref 65–99)
HCT: 45 % (ref 39.0–52.0)
Hemoglobin: 15.3 g/dL (ref 13.0–17.0)
Potassium: 3.6 mmol/L (ref 3.5–5.1)
SODIUM: 144 mmol/L (ref 135–145)
TCO2: 26 mmol/L (ref 0–100)

## 2015-01-25 LAB — CBC WITH DIFFERENTIAL/PLATELET
Basophils Absolute: 0 10*3/uL (ref 0.0–0.1)
Basophils Relative: 0 %
Eosinophils Absolute: 0.1 10*3/uL (ref 0.0–0.7)
Eosinophils Relative: 1 %
HCT: 42.8 % (ref 39.0–52.0)
Hemoglobin: 14.8 g/dL (ref 13.0–17.0)
Lymphocytes Relative: 20 %
Lymphs Abs: 2 10*3/uL (ref 0.7–4.0)
MCH: 30.4 pg (ref 26.0–34.0)
MCHC: 34.6 g/dL (ref 30.0–36.0)
MCV: 87.9 fL (ref 78.0–100.0)
Monocytes Absolute: 0.8 10*3/uL (ref 0.1–1.0)
Monocytes Relative: 8 %
Neutro Abs: 7 10*3/uL (ref 1.7–7.7)
Neutrophils Relative %: 71 %
Platelets: 161 10*3/uL (ref 150–400)
RBC: 4.87 MIL/uL (ref 4.22–5.81)
RDW: 12.9 % (ref 11.5–15.5)
WBC: 9.9 10*3/uL (ref 4.0–10.5)

## 2015-01-25 LAB — I-STAT TROPONIN, ED: Troponin i, poc: 0.01 ng/mL (ref 0.00–0.08)

## 2015-01-25 MED ORDER — SODIUM CHLORIDE 0.9 % IV BOLUS (SEPSIS)
1000.0000 mL | Freq: Once | INTRAVENOUS | Status: AC
  Administered 2015-01-26: 1000 mL via INTRAVENOUS

## 2015-01-25 MED ORDER — DILTIAZEM HCL 25 MG/5ML IV SOLN
20.0000 mg | Freq: Once | INTRAVENOUS | Status: AC
  Administered 2015-01-26: 20 mg via INTRAVENOUS
  Filled 2015-01-25: qty 5

## 2015-01-25 NOTE — ED Provider Notes (Signed)
CSN: [id number]     Arrival date & time 01/25/15  2315 History   By signing my name below, I, Forrestine Him, attest that this documentation has been prepared under the direction and in the presence of Everlene Balls, MD.  Electronically Signed: Forrestine Him, ED Scribe. 01/25/2015. 11:40 PM.   No chief complaint on file.  HPI  HPI Comments: WYETH HOFFER is a 74 y.o. male with a PMHx of HTN, cancer, CKD, and GERD who presents to the Emergency Department complaining of constant, ongoing chest pain onset 8:30 PM this evening while playing a game on his computer. Pain is described as sharp, it radiates across his chest and to his shoulders. No aggravating or alleviating factors at this time. No OTC medications or home remedies attempted prior to arrival. No recent fever, chills, nausea, or vomiting. Last follow up with Dr. Mallie Mussel Smith-Cardiologist 1 day ago without any issue at time of office visit.  PCP: Simona Huh, MD    Past Medical History  Diagnosis Date  . Hypertension   . Dysrhythmia     palpitations, followed by Dr.Ehinger, seen in prep for surgery on 03/27/2011   . Angina     chest pain- cardiac cath. followed in 2010, record available ,  told then that he should f/u /w Dr. Marisue Humble    . Asthma     uses  singulair  . Pneumonia     hosp. 20 yrs. ago  . Sleep apnea     study done, 10 yrs. ago, told that he needed  CPAP but  never used   . Hepatitis     jaundice- many yrs. ago  . Chronic kidney disease     prostate cancer - surg. removal- 1996  . Cancer [facility])     prostate, melanoma- 2010, excision    . Arthritis     R knee, back    . Hiatal hernia   . GERD (gastroesophageal reflux disease)   . Prostate cancer ([place]) 1995  . Melanoma ([place])     Face  . Diverticulitis   . Colon polyps    Past Surgical History  Procedure Laterality Date  . Cardiac catheterization      2010  . Tonsillectomy      as child  . Fracture surgery      L wrist, hardware- 1982  . Joint  replacement      L knee, 2009  . Prostatectomy      for ca  . Total knee arthroplasty  04/22/2011    Procedure: TOTAL KNEE ARTHROPLASTY;  Surgeon: Garald Balding, MD;  Location: [facility];  Service: Orthopedics;  Laterality: Right;  . Left heart catheterization with coronary angiogram N/A 09/08/2011    Procedure: LEFT HEART CATHETERIZATION WITH CORONARY ANGIOGRAM;  Surgeon: Sinclair Grooms, MD;  Location: [facility] CATH LAB;  Service: Cardiovascular;  Laterality: N/A;   Family History  Problem Relation Age of Onset  . Anesthesia problems Neg Hx   . Hypotension Neg Hx   . Malignant hyperthermia Neg Hx   . Pseudochol deficiency Neg Hx   . Crohn's disease Brother   . Breast cancer Mother   . Alzheimer's disease Father   . Heart disease Brother   . Hypertension Brother    Social History  Substance Use Topics  . Smoking status: Former Smoker    Quit date: 04/11/1983  . Smokeless tobacco: Never Used  . Alcohol Use: No    Review of Systems  A complete  10 system review of systems was obtained and all systems are negative except as noted in the HPI and PMH.    Allergies  Aminophylline; Ampicillin; Carvedilol; Lisinopril; Tenormin; and Zolpidem tartrate  Home Medications   Prior to Admission medications   Medication Sig Start Date End Date Taking? Authorizing Provider  Calcium Carbonate-Vitamin D (CALCIUM + D) 600-200 MG-UNIT TABS Take 1 tablet by mouth 2 (two) times daily.    Historical Provider, MD  fluticasone (FLONASE) 50 MCG/ACT nasal spray Place 2 sprays into the nose at bedtime.     Historical Provider, MD  hydroxypropyl methylcellulose (ISOPTO TEARS) 2.5 % ophthalmic solution Place 1 drop into both eyes 3 (three) times daily as needed. For dry eyes    Historical Provider, MD  ibuprofen (ADVIL,MOTRIN) 200 MG tablet Take 600 mg by mouth 2 (two) times daily as needed. (pain)    Historical Provider, MD  montelukast (SINGULAIR) 10 MG tablet Take 10 mg by mouth daily before breakfast.      Historical Provider, MD  Multiple Vitamins tablet Take 1 tablet by mouth daily.    Historical Provider, MD  Omega-3 Fatty Acids (FISH OIL) 1000 MG CAPS Take 1,000 mg by mouth 2 (two) times daily.    Historical Provider, MD  omeprazole (PRILOSEC) 40 MG capsule Take 40 mg by mouth daily.    Historical Provider, MD  telmisartan (MICARDIS) 80 MG tablet Take 80 mg by mouth daily.    Historical Provider, MD   Triage Vitals: BP 124/84 mmHg  Pulse 145  Resp 20  SpO2 98%   Physical Exam  Constitutional: He is oriented to person, place, and time. Vital signs are normal. He appears well-developed and well-nourished.  Non-toxic appearance. He does not appear ill. No distress.  HENT:  Head: Normocephalic and atraumatic.  Nose: Nose normal.  Mouth/Throat: Oropharynx is clear and moist. No oropharyngeal exudate.  Eyes: Conjunctivae and EOM are normal. Pupils are equal, round, and reactive to light. No scleral icterus.  Neck: Normal range of motion. Neck supple. No tracheal deviation, no edema, no erythema and normal range of motion present. No thyroid mass and no thyromegaly present.  Cardiovascular: Normal rate, regular rhythm, S1 normal, S2 normal, normal heart sounds, intact distal pulses and normal pulses.  Exam reveals no gallop and no friction rub.   No murmur heard. Pulmonary/Chest: Effort normal and breath sounds normal. No respiratory distress. He has no wheezes. He has no rhonchi. He has no rales.  Abdominal: Soft. Normal appearance and bowel sounds are normal. He exhibits no distension, no ascites and no mass. There is no hepatosplenomegaly. There is no tenderness. There is no rebound, no guarding and no CVA tenderness.  Musculoskeletal: Normal range of motion. He exhibits no edema or tenderness.  Lymphadenopathy:    He has no cervical adenopathy.  Neurological: He is alert and oriented to person, place, and time. He has normal strength. No cranial nerve deficit or sensory deficit.  Skin: Skin  is warm, dry and intact. No petechiae and no rash noted. He is not diaphoretic. No erythema. No pallor.  Psychiatric: He has a normal mood and affect. His behavior is normal. Judgment normal.  Nursing note and vitals reviewed.   ED Course  Procedures (including critical care time)  DIAGNOSTIC STUDIES: Oxygen Saturation is 98% on RA, Normal by my interpretation.    COORDINATION OF CARE: 11:26 PM- Will give Cardizem and fluids. Will order CXR, CBC, BNP, i-stat troponin, i-stat chem 8, BMP, and EKG. Discussed treatment  plan with pt at bedside and pt agreed to plan.     Labs Review Labs Reviewed  BASIC METABOLIC PANEL - Abnormal; Notable for the following:    Glucose, Bld 139 (*)    All other components within normal limits  I-STAT CHEM 8, ED - Abnormal; Notable for the following:    Glucose, Bld 138 (*)    All other components within normal limits  CBC WITH DIFFERENTIAL/PLATELET  BRAIN NATRIURETIC PEPTIDE  MAGNESIUM  I-STAT TROPOININ, ED    Imaging Review Dg Chest 2 View  01/25/2015  CLINICAL DATA:  Sudden onset of chest pain 3 hours prior. Atrial fibrillation. EXAM: CHEST  2 VIEW COMPARISON:  07/14/2013 FINDINGS: Stable chronic cardiomegaly. No pulmonary edema. Streaky retrocardiac opacity at the left lung base. The right lung is clear. No pleural effusion or pneumothorax. Degenerative change throughout the spine without acute osseous abnormality. IMPRESSION: Stable cardiomegaly. Streaky retrocardiac opacity is likely atelectasis versus early pneumonia. Electronically Signed   By: Jeb Levering M.D.   On: 01/25/2015 23:56   I have personally reviewed and evaluated these images and lab results as part of my medical decision-making.   EKG Interpretation   Date/Time:  Thursday January 25 2015 23:41:38 EST Ventricular Rate:  118 PR Interval:    QRS Duration: 101 QT Interval:  348 QTC Calculation: 488 R Axis:   -34 Text Interpretation:  Atrial fibrillation Left axis  deviation No  significant change since last tracing Confirmed by Glynn Octave  [phone]) on 01/25/2015 11:45:21 PM      MDM   Final diagnoses:  None   Patient presents to the ED for CP and palpitations.  No documented history of a fib, but he appears to be in that today.  Will give diltiazem for treatment and consult cardiology for proper disposition.   I spoke with Maylon Cos who recommends to keep the patient at [facility] and admit to the hospitalist.  Start heparin drip if no contraindications and he will consult on the patient.   After diltiazem heart rate has decreased from 140-120. Laboratory studies and chest x-ray are unremarkable.     CRITICAL CARE Performed by: Everlene Balls   Total critical care time: 35 minutes - a fib with RVR, diltiazem given  Critical care time was exclusive of separately billable procedures and treating other patients.  Critical care was necessary to treat or prevent imminent or life-threatening deterioration.  Critical care was time spent personally by me on the following activities: development of treatment plan with patient and/or surrogate as well as nursing, discussions with consultants, evaluation of patient's response to treatment, examination of patient, obtaining history from patient or surrogate, ordering and performing treatments and interventions, ordering and review of laboratory studies, ordering and review of radiographic studies, pulse oximetry and re-evaluation of patient's condition.    I personally performed the services described in this documentation, which was scribed in my presence. The recorded information has been reviewed and is accurate.     Everlene Balls, MD 01/26/15 0110

## 2015-01-26 ENCOUNTER — Encounter (HOSPITAL_COMMUNITY): Payer: Self-pay | Admitting: Emergency Medicine

## 2015-01-26 ENCOUNTER — Inpatient Hospital Stay (HOSPITAL_BASED_OUTPATIENT_CLINIC_OR_DEPARTMENT_OTHER): Payer: Medicare Other

## 2015-01-26 ENCOUNTER — Encounter (HOSPITAL_COMMUNITY): Payer: Medicare Other

## 2015-01-26 DIAGNOSIS — I10 Essential (primary) hypertension: Secondary | ICD-10-CM

## 2015-01-26 DIAGNOSIS — R609 Edema, unspecified: Secondary | ICD-10-CM | POA: Diagnosis not present

## 2015-01-26 DIAGNOSIS — I4891 Unspecified atrial fibrillation: Secondary | ICD-10-CM | POA: Diagnosis not present

## 2015-01-26 DIAGNOSIS — I480 Paroxysmal atrial fibrillation: Secondary | ICD-10-CM | POA: Diagnosis present

## 2015-01-26 DIAGNOSIS — C439 Malignant melanoma of skin, unspecified: Secondary | ICD-10-CM | POA: Insufficient documentation

## 2015-01-26 LAB — COMPREHENSIVE METABOLIC PANEL
ALBUMIN: 3.5 g/dL (ref 3.5–5.0)
ALT: 11 U/L — ABNORMAL LOW (ref 17–63)
ANION GAP: 4 — AB (ref 5–15)
AST: 18 U/L (ref 15–41)
Alkaline Phosphatase: 56 U/L (ref 38–126)
BUN: 19 mg/dL (ref 6–20)
CHLORIDE: 109 mmol/L (ref 101–111)
CO2: 29 mmol/L (ref 22–32)
Calcium: 8.6 mg/dL — ABNORMAL LOW (ref 8.9–10.3)
Creatinine, Ser: 1.09 mg/dL (ref 0.61–1.24)
GFR calc Af Amer: >60 mL/min (ref >60)
GFR calc non Af Amer: >60 mL/min (ref >60)
GLUCOSE: 106 mg/dL — AB (ref 65–99)
POTASSIUM: 3.5 mmol/L (ref 3.5–5.1)
SODIUM: 142 mmol/L (ref 135–145)
Total Bilirubin: 1 mg/dL (ref 0.3–1.2)
Total Protein: 6.2 g/dL — ABNORMAL LOW (ref 6.5–8.1)

## 2015-01-26 LAB — CBC WITH DIFFERENTIAL/PLATELET
BASOS ABS: 0 10*3/uL (ref 0.0–0.1)
BASOS PCT: 0 %
EOS ABS: 0.1 10*3/uL (ref 0.0–0.7)
EOS PCT: 2 %
HCT: 37.3 % — ABNORMAL LOW (ref 39.0–52.0)
Hemoglobin: 12.6 g/dL — ABNORMAL LOW (ref 13.0–17.0)
Lymphocytes Relative: 27 %
Lymphs Abs: 1.8 10*3/uL (ref 0.7–4.0)
MCH: 30.6 pg (ref 26.0–34.0)
MCHC: 33.8 g/dL (ref 30.0–36.0)
MCV: 90.5 fL (ref 78.0–100.0)
MONO ABS: 0.7 10*3/uL (ref 0.1–1.0)
Monocytes Relative: 10 %
NEUTROS ABS: 4.1 10*3/uL (ref 1.7–7.7)
Neutrophils Relative %: 61 %
PLATELETS: 156 10*3/uL (ref 150–400)
RBC: 4.12 MIL/uL — ABNORMAL LOW (ref 4.22–5.81)
RDW: 13.2 % (ref 11.5–15.5)
WBC: 6.6 10*3/uL (ref 4.0–10.5)

## 2015-01-26 LAB — BASIC METABOLIC PANEL
Anion gap: 8 (ref 5–15)
BUN: 18 mg/dL (ref 6–20)
CALCIUM: 9.4 mg/dL (ref 8.9–10.3)
CO2: 26 mmol/L (ref 22–32)
CREATININE: 1.12 mg/dL (ref 0.61–1.24)
Chloride: 108 mmol/L (ref 101–111)
GFR calc Af Amer: >60 mL/min (ref >60)
Glucose, Bld: 139 mg/dL — ABNORMAL HIGH (ref 65–99)
Potassium: 3.6 mmol/L (ref 3.5–5.1)
Sodium: 142 mmol/L (ref 135–145)

## 2015-01-26 LAB — T4, FREE: FREE T4: 1.05 ng/dL (ref 0.61–1.12)

## 2015-01-26 LAB — BRAIN NATRIURETIC PEPTIDE: B Natriuretic Peptide: 50 pg/mL (ref 0.0–100.0)

## 2015-01-26 LAB — TROPONIN I
Troponin I: <0.03 ng/mL (ref <0.031)
Troponin I: <0.03 ng/mL (ref <0.031)

## 2015-01-26 LAB — MAGNESIUM: MAGNESIUM: 2 mg/dL (ref 1.7–2.4)

## 2015-01-26 LAB — TSH: TSH: 2.27 u[IU]/mL (ref 0.350–4.500)

## 2015-01-26 LAB — PROTIME-INR
INR: 1.23 (ref 0.00–1.49)
PROTHROMBIN TIME: 15.6 s — AB (ref 11.6–15.2)

## 2015-01-26 LAB — APTT: APTT: 27 s (ref 24–37)

## 2015-01-26 MED ORDER — DILTIAZEM HCL ER COATED BEADS 120 MG PO CP24
120.0000 mg | ORAL_CAPSULE | Freq: Every day | ORAL | Status: DC
  Administered 2015-01-26: 120 mg via ORAL
  Filled 2015-01-26: qty 1

## 2015-01-26 MED ORDER — SODIUM CHLORIDE 0.9 % IJ SOLN
3.0000 mL | Freq: Two times a day (BID) | INTRAMUSCULAR | Status: DC
Start: 2015-01-26 — End: 2015-01-26
  Administered 2015-01-26: 3 mL via INTRAVENOUS

## 2015-01-26 MED ORDER — POLYVINYL ALCOHOL 1.4 % OP SOLN
1.0000 [drp] | Freq: Three times a day (TID) | OPHTHALMIC | Status: DC | PRN
  Filled 2015-01-26: qty 15

## 2015-01-26 MED ORDER — ACETAMINOPHEN 650 MG RE SUPP: 650.0000 mg | Freq: Four times a day (QID) | RECTAL | Status: DC | PRN

## 2015-01-26 MED ORDER — FLUTICASONE PROPIONATE 50 MCG/ACT NA SUSP
2.0000 | Freq: Every evening | NASAL | Status: DC | PRN
  Filled 2015-01-26: qty 16

## 2015-01-26 MED ORDER — ONDANSETRON HCL 4 MG PO TABS: 4.0000 mg | ORAL_TABLET | Freq: Four times a day (QID) | ORAL | Status: DC | PRN

## 2015-01-26 MED ORDER — DILTIAZEM HCL ER 60 MG PO CP12: 120.0000 mg | ORAL_CAPSULE | Freq: Every day | ORAL | Status: DC

## 2015-01-26 MED ORDER — APIXABAN 5 MG PO TABS: 5.0000 mg | ORAL_TABLET | Freq: Two times a day (BID) | ORAL | Status: DC

## 2015-01-26 MED ORDER — HEPARIN (PORCINE) IN NACL 100-0.45 UNIT/ML-% IJ SOLN
1100.0000 [IU]/h | INTRAMUSCULAR | Status: AC
  Administered 2015-01-26: 1100 [IU]/h via INTRAVENOUS
  Filled 2015-01-26 (×2): qty 250

## 2015-01-26 MED ORDER — DILTIAZEM HCL ER COATED BEADS 120 MG PO CP24: 120.0000 mg | ORAL_CAPSULE | Freq: Every day | ORAL | Status: DC

## 2015-01-26 MED ORDER — IRBESARTAN 300 MG PO TABS
300.0000 mg | ORAL_TABLET | Freq: Every day | ORAL | Status: DC
  Administered 2015-01-26: 300 mg via ORAL
  Filled 2015-01-26: qty 1

## 2015-01-26 MED ORDER — ONDANSETRON HCL 4 MG/2ML IJ SOLN: 4.0000 mg | Freq: Four times a day (QID) | INTRAMUSCULAR | Status: DC | PRN

## 2015-01-26 MED ORDER — ACETAMINOPHEN 325 MG PO TABS: 650.0000 mg | ORAL_TABLET | Freq: Four times a day (QID) | ORAL | Status: DC | PRN

## 2015-01-26 MED ORDER — OMEGA-3-ACID ETHYL ESTERS 1 G PO CAPS
1000.0000 mg | ORAL_CAPSULE | Freq: Every day | ORAL | Status: DC
  Administered 2015-01-26: 1000 mg via ORAL
  Filled 2015-01-26: qty 1

## 2015-01-26 MED ORDER — MONTELUKAST SODIUM 10 MG PO TABS
10.0000 mg | ORAL_TABLET | Freq: Every day | ORAL | Status: DC
  Administered 2015-01-26: 10 mg via ORAL
  Filled 2015-01-26: qty 1

## 2015-01-26 MED ORDER — PANTOPRAZOLE SODIUM 40 MG PO TBEC: 40.0000 mg | DELAYED_RELEASE_TABLET | Freq: Every day | ORAL | Status: DC

## 2015-01-26 MED ORDER — PANTOPRAZOLE SODIUM 40 MG PO TBEC
40.0000 mg | DELAYED_RELEASE_TABLET | Freq: Every day | ORAL | Status: DC
  Administered 2015-01-26: 40 mg via ORAL
  Filled 2015-01-26 (×2): qty 1

## 2015-01-26 MED ORDER — DILTIAZEM HCL ER 60 MG PO CP12: 120.0000 mg | ORAL_CAPSULE | Freq: Two times a day (BID) | ORAL | Status: DC

## 2015-01-26 MED ORDER — APIXABAN 5 MG PO TABS
5.0000 mg | ORAL_TABLET | Freq: Two times a day (BID) | ORAL | Status: DC
  Administered 2015-01-26: 5 mg via ORAL
  Filled 2015-01-26: qty 1

## 2015-01-26 MED ORDER — HEPARIN BOLUS VIA INFUSION
3000.0000 [IU] | Freq: Once | INTRAVENOUS | Status: AC
  Administered 2015-01-26: 3000 [IU] via INTRAVENOUS
  Filled 2015-01-26: qty 3000

## 2015-01-26 MED ORDER — TELMISARTAN 80 MG PO TABS: 40.0000 mg | ORAL_TABLET | Freq: Every day | ORAL | Status: DC

## 2015-01-26 NOTE — Consult Note (Signed)
Reason for Consult: chest pain, palpitations, new onset atrial fibrillation Primary Cardiologist: Dr. Katrinka Blazing Referring Physician: Dr. Tamala Ser is an 74 y.o. male.  HPI: Mr. Kincaid is a 74 yo man with PMH of hypertension, GERD who presented to the ER with sharp chest pain that suddenly onset at 8:30 pm this evening while playing on his computer. He characterized the discomfort as sharp, radiating across his chest to his shoulders. He endorsed associated dyspnea. Of note he saw Dr. Katrinka Blazing 11/15 who started workup with holter monitor to evaluate for occult atrial fibrillation as source of his palpitations as well as referral for sleep study.   He had a coronary angiogram in 2013 with clean/patent coronary arteries. He has had persistent exertional fatigue and daytime sleepiness concerning for sleep apnea.   No recent infections, no travel, no change in medications. No fever/chills/nausea/vomiting/diarrhea.     Past Medical History  Diagnosis Date  . Hypertension   . Dysrhythmia     palpitations, followed by Dr.Ehinger, seen in prep for surgery on 03/27/2011   . Angina     chest pain- cardiac cath. followed in 2010, record available ,  told then that he should f/u /w Dr. Manus Gunning    . Asthma     uses  singulair  . Pneumonia     hosp. 20 yrs. ago  . Sleep apnea     study done, 10 yrs. ago, told that he needed  CPAP but  never used   . Hepatitis     jaundice- many yrs. ago  . Chronic kidney disease     prostate cancer - surg. removal- 1996  . Cancer [facility])     prostate, melanoma- 2010, excision    . Arthritis     R knee, back    . Hiatal hernia   . GERD (gastroesophageal reflux disease)   . Prostate cancer ([facility]) 1995  . Melanoma ([facility])     Face  . Diverticulitis   . Colon polyps     Past Surgical History  Procedure Laterality Date  . Cardiac catheterization      2010  . Tonsillectomy      as child  . Fracture surgery      L wrist, hardware- 1982  . Joint  replacement      L knee, 2009  . Prostatectomy      for ca  . Total knee arthroplasty  04/22/2011    Procedure: TOTAL KNEE ARTHROPLASTY;  Surgeon: Valeria Batman, MD;  Location[facility] OR;  Service: Orthopedics;  Laterality: Right;  . Left heart catheterization with coronary angiogram N/A 09/08/2011    Procedure: LEFT HEART CATHETERIZATION WITH CORONARY ANGIOGRAM;  Surgeon: Lesleigh Noe, MD;  Location: [facility] CATH LAB;  Service: Cardiovascular;  Laterality: N/A;    Family History  Problem Relation Age of Onset  . Anesthesia problems Neg Hx   . Hypotension Neg Hx   . Malignant hyperthermia Neg Hx   . Pseudochol deficiency Neg Hx   . Crohn's disease Brother   . Breast cancer Mother   . Alzheimer's disease Father   . Heart disease Brother   . Hypertension Brother     Social History:  reports that he quit smoking about 31 years ago. He has never used smokeless tobacco. He reports that he does not drink alcohol or use illicit drugs.  Allergies:  Allergies  Allergen Reactions  . Aminophylline Anaphylaxis  . Ampicillin Diarrhea and Nausea And Vomiting  Has patient had a PCN reaction causing immediate rash, facial/tongue/throat swelling, SOB or lightheadedness with hypotension: No Has patient had a PCN reaction causing severe rash involving mucus membranes or skin necrosis: No Has patient had a PCN reaction that required hospitalization No Has patient had a PCN reaction occurring within the last 10 years: No If all of the above answers are "NO", then may proceed with Cephalosporin use.   . Carvedilol     SOB/Fatigue  . Lisinopril Cough  . Tenormin [Atenolol] Other (See Comments)    fatigue  . Zolpidem Tartrate Other (See Comments)    hallucinations    Medications: I have reviewed the patient's current medications. Prior to Admission:  (Not in a hospital admission) Scheduled:  Results for orders placed or performed during the hospital encounter of 01/25/15 (from the past 48  hour(s))  CBC with Differential/Platelet     Status: None   Collection Time: 01/25/15 11:37 PM  Result Value Ref Range   WBC 9.9 4.0 - 10.5 K/uL   RBC 4.87 4.22 - 5.81 MIL/uL   Hemoglobin 14.8 13.0 - 17.0 g/dL   HCT 42.8 39.0 - 52.0 %   MCV 87.9 78.0 - 100.0 fL   MCH 30.4 26.0 - 34.0 pg   MCHC 34.6 30.0 - 36.0 g/dL   RDW 12.9 11.5 - 15.5 %   Platelets 161 150 - 400 K/uL   Neutrophils Relative % 71 %   Neutro Abs 7.0 1.7 - 7.7 K/uL   Lymphocytes Relative 20 %   Lymphs Abs 2.0 0.7 - 4.0 K/uL   Monocytes Relative 8 %   Monocytes Absolute 0.8 0.1 - 1.0 K/uL   Eosinophils Relative 1 %   Eosinophils Absolute 0.1 0.0 - 0.7 K/uL   Basophils Relative 0 %   Basophils Absolute 0.0 0.0 - 0.1 K/uL  Brain natriuretic peptide     Status: None   Collection Time: 01/25/15 11:37 PM  Result Value Ref Range   B Natriuretic Peptide 50.0 0.0 - 100.0 pg/mL  Basic metabolic panel     Status: Abnormal   Collection Time: 01/25/15 11:37 PM  Result Value Ref Range   Sodium 142 135 - 145 mmol/L   Potassium 3.6 3.5 - 5.1 mmol/L   Chloride 108 101 - 111 mmol/L   CO2 26 22 - 32 mmol/L   Glucose, Bld 139 (H) 65 - 99 mg/dL   BUN 18 6 - 20 mg/dL   Creatinine, Ser 1.12 0.61 - 1.24 mg/dL   Calcium 9.4 8.9 - 10.3 mg/dL   GFR calc non Af Amer >60 >60 mL/min   GFR calc Af Amer >60 >60 mL/min    Comment: (NOTE) The eGFR has been calculated using the CKD EPI equation. This calculation has not been validated in all clinical situations. eGFR's persistently <60 mL/min signify possible Chronic Kidney Disease.    Anion gap 8 5 - 15  I-stat troponin, ED     Status: None   Collection Time: 01/25/15 11:44 PM  Result Value Ref Range   Troponin i, poc 0.01 0.00 - 0.08 ng/mL   Comment 3            Comment: Due to the release kinetics of cTnI, a negative result within the first hours of the onset of symptoms does not rule out myocardial infarction with certainty. If myocardial infarction is still  suspected, repeat the test at appropriate intervals.   I-stat chem 8, ed     Status: Abnormal  Collection Time: 01/25/15 11:46 PM  Result Value Ref Range   Sodium 144 135 - 145 mmol/L   Potassium 3.6 3.5 - 5.1 mmol/L   Chloride 106 101 - 111 mmol/L   BUN 18 6 - 20 mg/dL   Creatinine, Ser 1.20 0.61 - 1.24 mg/dL   Glucose, Bld 138 (H) 65 - 99 mg/dL   Calcium, Ion 1.18 1.13 - 1.30 mmol/L   TCO2 26 0 - 100 mmol/L   Hemoglobin 15.3 13.0 - 17.0 g/dL   HCT 45.0 39.0 - 52.0 %  Magnesium     Status: None   Collection Time: 01/25/15 11:47 PM  Result Value Ref Range   Magnesium 2.0 1.7 - 2.4 mg/dL    Dg Chest 2 View  01/25/2015  CLINICAL DATA:  Sudden onset of chest pain 3 hours prior. Atrial fibrillation. EXAM: CHEST  2 VIEW COMPARISON:  07/14/2013 FINDINGS: Stable chronic cardiomegaly. No pulmonary edema. Streaky retrocardiac opacity at the left lung base. The right lung is clear. No pleural effusion or pneumothorax. Degenerative change throughout the spine without acute osseous abnormality. IMPRESSION: Stable cardiomegaly. Streaky retrocardiac opacity is likely atelectasis versus early pneumonia. Electronically Signed   By: Jeb Levering M.D.   On: 01/25/2015 23:56    Review of Systems  Constitutional: Positive for malaise/fatigue. Negative for fever and chills.  HENT: Positive for hearing loss. Negative for ear discharge, ear pain and tinnitus.   Eyes: Negative for photophobia and pain.  Respiratory: Positive for shortness of breath.   Cardiovascular: Positive for chest pain and palpitations. Negative for PND.  Gastrointestinal: Negative for nausea, vomiting and abdominal pain.  Genitourinary: Negative for dysuria and urgency.  Musculoskeletal: Negative for myalgias and neck pain.  Neurological: Positive for weakness. Negative for tingling, tremors and sensory change.  Endo/Heme/Allergies: Negative for polydipsia.  Psychiatric/Behavioral: Negative for suicidal ideas, hallucinations  and substance abuse.   Blood pressure 94/52, pulse 91, resp. rate 18, SpO2 95 %. Physical Exam  Nursing note and vitals reviewed. Constitutional: He is oriented to person, place, and time. He appears well-developed and well-nourished. No distress.  HENT:  Head: Normocephalic and atraumatic.  Nose: Nose normal.  Mouth/Throat: Oropharynx is clear and moist. No oropharyngeal exudate.  Eyes: Conjunctivae and EOM are normal. Pupils are equal, round, and reactive to light. No scleral icterus.  Neck: Normal range of motion. Neck supple. No JVD present. No tracheal deviation present.  Cardiovascular: Normal rate, regular rhythm, normal heart sounds and intact distal pulses.  Exam reveals no gallop.   No murmur heard.    GI: Soft. Bowel sounds are normal. He exhibits no distension. There is no tenderness. There is no rebound.  Musculoskeletal: Normal range of motion. He exhibits no edema.  Neurological: He is alert and oriented to person, place, and time. No cranial nerve deficit. Coordination normal.  Skin: Skin is warm and dry. He is not diaphoretic. No erythema. No pallor.  Psychiatric: He has a normal mood and affect. His behavior is normal.  EKG reviewed; atrial fibrillation with RVR 110s-120s Labs reviewed; hct 42.8, plt 161, cr 1.12, wbc 9.9 6/13 echo: EF 60-65%, mild AI, dLA, PASP 45 mmHg  Assessment/Plan: Mr. Yanik is a 74 yo man with PMH of hypertension, GERD who presented to the ER with sharp chest pain that suddenly onset at 8:30 pm this evening while playing on his computer found to have atrial fibrillation with RVR. Differential is idiopathic, ischemia, heart failure, drugs, thyroid disease, infection among other etiologies. For now, diltiazem  gtt, observe on telemetry, start anticoagulation with heparin gtt for potential DCCV. He actually converted to NSR overnight. Feels much improved. Plan will be to determine what oral NOAC is best for him at home.  Problem List New Onset  atrial fibrillation: CHADS2VASC = 3 (age, hypertension) Hypertension GERD  - Echocardiogram in AM  - telemetry, trend cardiac markers, diltiazem gtt for rate control - hba1c, troponins, lipid panel - evaluate for infectious triggers with urinalysis, thyroid triggers with TSH and HF/valvular etiology with echocardiogram - heparin gtt per pharmacy for atrial fibrillation anticoagulation --> will transition to oral NOAC this AM - he is now in NSR so no TEE-DCCV will be needed - likely discharge home later today    Kristin Barcus 01/26/2015, 1:11 AM

## 2015-01-26 NOTE — Progress Notes (Signed)
Echocardiogram 2D Echocardiogram has been performed.  [name] [name] 01/26/2015, 9:28 AM

## 2015-01-26 NOTE — Progress Notes (Signed)
VASCULAR LAB PRELIMINARY  PRELIMINARY  PRELIMINARY  PRELIMINARY  Bilateral lower extremity venous duplex completed.    Preliminary report:  Bilateral:  No evidence of DVT, superficial thrombosis, or Baker's Cyst.   Suann Klier, RVS 01/26/2015, 11:15 AM

## 2015-01-26 NOTE — Progress Notes (Signed)
ANTICOAGULATION CONSULT NOTE - Follow up [place] for Eliquis Indication: A-fib  Allergies  Allergen Reactions  . Aminophylline Anaphylaxis  . Ampicillin Diarrhea and Nausea And Vomiting    Has patient had a PCN reaction causing immediate rash, facial/tongue/throat swelling, SOB or lightheadedness with hypotension: No Has patient had a PCN reaction causing severe rash involving mucus membranes or skin necrosis: No Has patient had a PCN reaction that required hospitalization No Has patient had a PCN reaction occurring within the last 10 years: No If all of the above answers are "NO", then may proceed with Cephalosporin use.   . Carvedilol     SOB/Fatigue  . Lisinopril Cough  . Tenormin [Atenolol] Other (See Comments)    fatigue  . Zolpidem Tartrate Other (See Comments)    hallucinations    Patient Measurements: Height: '5\' 7"'$  (170.2 cm) Weight: 242 lb 11.2 oz (110.088 kg) IBW/kg (Calculated) : 66.1 Heparin Dosing Weight: 79 kg  Vital Signs: Temp: 97.9 F (36.6 C) (11/18 0515) Temp Source: Oral (11/18 0515) BP: 111/48 mmHg (11/18 0515) Pulse Rate: 69 (11/18 0515)  Labs:  Recent Labs  01/25/15 2337 01/25/15 2346 01/26/15 0330 01/26/15 0858  HGB 14.8 15.3 12.6*  --   HCT 42.8 45.0 37.3*  --   PLT 161  --  156  --   APTT  --   --  27  --   LABPROT  --   --  15.6*  --   INR  --   --  1.23  --   CREATININE 1.12 1.20 1.09  --   TROPONINI  --   --  <0.03 <0.03    Estimated Creatinine Clearance: 68.3 mL/min (by C-G formula based on Cr of 1.09).   Medical History: Past Medical History  Diagnosis Date  . Hypertension   . Dysrhythmia     palpitations, followed by Dr.Ehinger, seen in prep for surgery on 03/27/2011   . Angina     chest pain- cardiac cath. followed in 2010, record available ,  told then that he should f/u /w Dr. Marisue Humble    . Asthma     uses  singulair  . Pneumonia     hosp. 20 yrs. ago  . Sleep apnea     study done, 10 yrs. ago,  told that he needed  CPAP but  never used   . Hepatitis     jaundice- many yrs. ago  . Chronic kidney disease     prostate cancer - surg. removal- 1996  . Arthritis     R knee, back    . Hiatal hernia   . GERD (gastroesophageal reflux disease)   . Diverticulitis   . Colon polyps   . Cancer [facility])     prostate, melanoma- 2010, excision    . Prostate cancer ([place]) 1995  . Melanoma ([place])     Face    Medications:  Prescriptions prior to admission  Medication Sig Dispense Refill Last Dose  . Calcium Carbonate-Vitamin D (CALCIUM + D) 600-200 MG-UNIT TABS Take 1 tablet by mouth 2 (two) times daily.   01/25/2015 at Unknown time  . fluticasone (FLONASE) 50 MCG/ACT nasal spray Place 2 sprays into the nose at bedtime as needed for allergies.    Past Week at Unknown time  . hydroxypropyl methylcellulose (ISOPTO TEARS) 2.5 % ophthalmic solution Place 1 drop into both eyes 3 (three) times daily as needed. For dry eyes   01/25/2015 at Unknown time  . ibuprofen (  ADVIL,MOTRIN) 200 MG tablet Take 600 mg by mouth 2 (two) times daily as needed. (pain)   Past Week at Unknown time  . montelukast (SINGULAIR) 10 MG tablet Take 10 mg by mouth daily before breakfast.    01/25/2015 at Unknown time  . Multiple Vitamins tablet Take 1 tablet by mouth daily.   01/25/2015 at Unknown time  . Omega-3 Fatty Acids (FISH OIL) 1000 MG CAPS Take 1,000 mg by mouth 2 (two) times daily.   01/25/2015 at Unknown time  . omeprazole (PRILOSEC) 40 MG capsule Take 40 mg by mouth at bedtime.    Past Week at Unknown time  . Pseudoeph-Doxylamine-DM-APAP (NYQUIL PO) Take 30 mLs by mouth at bedtime as needed (cold symptoms).   Past Week at Unknown time  . [DISCONTINUED] telmisartan (MICARDIS) 80 MG tablet Take 80 mg by mouth daily.   01/25/2015 at Unknown time   Scheduled:  . diltiazem  120 mg Oral Daily  . montelukast  10 mg Oral QAC breakfast  . omega-3 acid ethyl esters  1,000 mg Oral Daily  . pantoprazole  40 mg Oral Daily  .  sodium chloride  3 mL Intravenous Q12H   Infusions:  . heparin 1,100 Units/hr (01/26/15 0517)    Assessment: 65 yoM c/o CP and palpitations, found to have Afib with RVR.  Pharmacy was initially consulted to dose IV Heparin, then transition to Eliquis.  Today, 01/26/2015: Heparin level: not collected CBC: Hgb 12.6 is low and decreased, Plt remain WNL No bleeding or complications reported. SCr 1.09 with CrCl ~ 68 ml/min Eliquis discount card provided by Case manager   Goal of Therapy:  Heparin level 0.3-0.7 units/ml Monitor platelets by anticoagulation protocol: Yes   Plan:   Eliquis '5mg'$  PO BID  Stop Heparin infusion at the time of first Eliquis dose.  Monitor for bleeding  Pharmacist to provide education prior to discharge.  Gretta Arab PharmD, BCPS Pager [phone] 01/26/2015 2:15 PM

## 2015-01-26 NOTE — Progress Notes (Addendum)
Tentative f/u scheduled with Cecilie Kicks 12/2 at 10am. His appointments for echo & 48 hour event monitor for 01/31/15 will be canceled - [place] was ordered to evaluate for palpitations and possible AF - which he was found to have this admission, and echo done as inpatient. I called over to [facility] to talk to the nurse to let the patient know of this. Dr. Irish Lack will be seeing the patient this afternoon for cardiology @ [facility]. Asami Lambright PA-C

## 2015-01-26 NOTE — Discharge Instructions (Signed)
Information on my medicine - ELIQUIS (apixaban)  This medication education was reviewed with me or my healthcare representative as part of my discharge preparation.  The pharmacist that spoke with me during my hospital stay was:  [name] [name]  Why was Eliquis prescribed for you? Eliquis was prescribed for you to reduce the risk of forming blood clots that can cause a stroke if you have a medical condition called atrial fibrillation (a type of irregular heartbeat) OR to reduce the risk of a blood clots forming after orthopedic surgery.  What do You need to know about Eliquis ? Take your Eliquis TWICE DAILY - one tablet in the morning and one tablet in the evening with or without food.  It would be best to take the doses about the same time each day.  If you have difficulty swallowing the tablet whole please discuss with your pharmacist how to take the medication safely.  Take Eliquis exactly as prescribed by your doctor and DO NOT stop taking Eliquis without talking to the doctor who prescribed the medication.  Stopping may increase your risk of developing a new clot or stroke.  Refill your prescription before you run out.  After discharge, you should have regular check-up appointments with your healthcare provider that is prescribing your Eliquis.  In the future your dose may need to be changed if your kidney function or weight changes by a significant amount or as you get older.  What do you do if you miss a dose? If you miss a dose, take it as soon as you remember on the same day and resume taking twice daily.  Do not take more than one dose of ELIQUIS at the same time.  Important Safety Information A possible side effect of Eliquis is bleeding. You should call your healthcare provider right away if you experience any of the following: ? Bleeding from an injury or your nose that does not stop. ? Unusual colored urine (red or dark brown) or unusual colored stools (red or  black). ? Unusual bruising for unknown reasons. ? A serious fall or if you hit your head (even if there is no bleeding).  Some medicines may interact with Eliquis and might increase your risk of bleeding or clotting while on Eliquis. To help avoid this, consult your healthcare provider or pharmacist prior to using any new prescription or non-prescription medications, including herbals, vitamins, non-steroidal anti-inflammatory drugs (NSAIDs) and supplements.  This website has more information on Eliquis (apixaban): [email or web address].

## 2015-01-26 NOTE — Progress Notes (Signed)
Per request, provide patient with discount Eliquis card.

## 2015-01-26 NOTE — H&P (Signed)
[facility] History and Physical  [name] [name] ACZ:[id number] DOB: 1940-07-19 DOA: 01/25/2015  Referring physician: Dr.Oni. PCP: Simona Huh, MD  Specialists: Dr.Smith.  Chief Complaint: Palpitations and chest pain.  HPI: [name] [name] is a 74 y.o. male with history of hypertension, OSA, asthma presents to the ER because of chest pain and palpitations. Patient's symptoms started last night around 9 PM. Patient started having cramping chest pain with palpitations. Patient called EMS and was brought to the ER. In the ER patient was found to be A. fib with RVR. Cardizem 20 mg IV bolus was given following which patient's heart rate got controlled and converted to sinus rhythm. Patient has been having off-and-on palpitations and had recently followed up with cardiologist Dr. Tamala Julian and was originally planned to have 2-D echo and Holter monitor done as outpatient. Patient has had a cardiac cath in 2013 which was unremarkable. Patient recently has been also having exertional shortness of breath with lower extremity edema. Otherwise patient denies any nausea vomiting abdominal pain and diarrhea. Recently patient had upper respiratory tract infection like symptoms.   Review of Systems: As presented in the history of presenting illness, rest negative.  Past Medical History  Diagnosis Date  . Hypertension   . Dysrhythmia     palpitations, followed by Dr.Ehinger, seen in prep for surgery on 03/27/2011   . Angina     chest pain- cardiac cath. followed in 2010, record available ,  told then that he should f/u /w Dr. Marisue Humble    . Asthma     uses  singulair  . Pneumonia     hosp. 20 yrs. ago  . Sleep apnea     study done, 10 yrs. ago, told that he needed  CPAP but  never used   . Hepatitis     jaundice- many yrs. ago  . Chronic kidney disease     prostate cancer - surg. removal- 1996  . Arthritis     R knee, back    . Hiatal hernia   . GERD (gastroesophageal reflux disease)   .  Diverticulitis   . Colon polyps   . Cancer [facility])     prostate, melanoma- 2010, excision    . Prostate cancer ([place]) 1995  . Melanoma ([place])     Face   Past Surgical History  Procedure Laterality Date  . Cardiac catheterization      2010  . Tonsillectomy      as child  . Fracture surgery      L wrist, hardware- 1982  . Joint replacement      L knee, 2009  . Prostatectomy      for ca  . Total knee arthroplasty  04/22/2011    Procedure: TOTAL KNEE ARTHROPLASTY;  Surgeon: Garald Balding, MD;  Location: [facility];  Service: Orthopedics;  Laterality: Right;  . Left heart catheterization with coronary angiogram N/A 09/08/2011    Procedure: LEFT HEART CATHETERIZATION WITH CORONARY ANGIOGRAM;  Surgeon: Sinclair Grooms, MD;  Location: [facility] CATH LAB;  Service: Cardiovascular;  Laterality: N/A;   Social History:  reports that he quit smoking about 31 years ago. He has never used smokeless tobacco. He reports that he does not drink alcohol or use illicit drugs. Where does patient live home. Can patient participate in ADLs? Yes.  Allergies  Allergen Reactions  . Aminophylline Anaphylaxis  . Ampicillin Diarrhea and Nausea And Vomiting    Has patient had a PCN reaction causing immediate rash, facial/tongue/throat  swelling, SOB or lightheadedness with hypotension: No Has patient had a PCN reaction causing severe rash involving mucus membranes or skin necrosis: No Has patient had a PCN reaction that required hospitalization No Has patient had a PCN reaction occurring within the last 10 years: No If all of the above answers are "NO", then may proceed with Cephalosporin use.   . Carvedilol     SOB/Fatigue  . Lisinopril Cough  . Tenormin [Atenolol] Other (See Comments)    fatigue  . Zolpidem Tartrate Other (See Comments)    hallucinations    Family History:  Family History  Problem Relation Age of Onset  . Anesthesia problems Neg Hx   . Hypotension Neg Hx   . Malignant hyperthermia Neg Hx    . Pseudochol deficiency Neg Hx   . Crohn's disease Brother   . Breast cancer Mother   . Alzheimer's disease Father   . Heart disease Brother   . Hypertension Brother       Prior to Admission medications   Medication Sig Start Date End Date Taking? Authorizing Provider  Calcium Carbonate-Vitamin D (CALCIUM + D) 600-200 MG-UNIT TABS Take 1 tablet by mouth 2 (two) times daily.   Yes Historical Provider, MD  fluticasone (FLONASE) 50 MCG/ACT nasal spray Place 2 sprays into the nose at bedtime as needed for allergies.    Yes Historical Provider, MD  hydroxypropyl methylcellulose (ISOPTO TEARS) 2.5 % ophthalmic solution Place 1 drop into both eyes 3 (three) times daily as needed. For dry eyes   Yes Historical Provider, MD  ibuprofen (ADVIL,MOTRIN) 200 MG tablet Take 600 mg by mouth 2 (two) times daily as needed. (pain)   Yes Historical Provider, MD  montelukast (SINGULAIR) 10 MG tablet Take 10 mg by mouth daily before breakfast.    Yes Historical Provider, MD  Multiple Vitamins tablet Take 1 tablet by mouth daily.   Yes Historical Provider, MD  Omega-3 Fatty Acids (FISH OIL) 1000 MG CAPS Take 1,000 mg by mouth 2 (two) times daily.   Yes Historical Provider, MD  omeprazole (PRILOSEC) 40 MG capsule Take 40 mg by mouth at bedtime.    Yes Historical Provider, MD  Pseudoeph-Doxylamine-DM-APAP (NYQUIL PO) Take 30 mLs by mouth at bedtime as needed (cold symptoms).   Yes Historical Provider, MD  telmisartan (MICARDIS) 80 MG tablet Take 80 mg by mouth daily.   Yes Historical Provider, MD    Physical Exam: Filed Vitals:   01/26/15 0100 01/26/15 0130 01/26/15 0132 01/26/15 0207  BP: 94/52 110/62 154/69 120/58  Pulse: 91 64 85 68  Temp:    97.8 F (36.6 C)  TempSrc:    Oral  Resp: '18 16 15 16  '$ Height:    '5\' 7"'$  (1.702 m)  Weight:    110.088 kg (242 lb 11.2 oz)  SpO2: 95% 98% 96% 98%     General:  Moderately built and nourished.  Eyes: Anicteric no pallor.  ENT: No discharge from the ears eyes  nose and mouth.  Neck: No mass felt. No JVD appreciated.  Cardiovascular: S1 and S2 heard.  Respiratory: No rhonchi or crepitations.  Abdomen: Soft nontender bowel sounds present.  Skin: No rash.  Musculoskeletal: Bilateral lower extremity edema.  Psychiatric: Appears normal.  Neurologic: Alert awake oriented to time place and person. Moves all extremities.  Labs on Admission:  Basic Metabolic Panel:  Recent Labs Lab 01/25/15 2337 01/25/15 2346 01/25/15 2347  NA 142 144  --   K 3.6 3.6  --  CL 108 106  --   CO2 26  --   --   GLUCOSE 139* 138*  --   BUN 18 18  --   CREATININE 1.12 1.20  --   CALCIUM 9.4  --   --   MG  --   --  2.0   Liver Function Tests: No results for input(s): AST, ALT, ALKPHOS, BILITOT, PROT, ALBUMIN in the last 168 hours. No results for input(s): LIPASE, AMYLASE in the last 168 hours. No results for input(s): AMMONIA in the last 168 hours. CBC:  Recent Labs Lab 01/25/15 2337 01/25/15 2346  WBC 9.9  --   NEUTROABS 7.0  --   HGB 14.8 15.3  HCT 42.8 45.0  MCV 87.9  --   PLT 161  --    Cardiac Enzymes: No results for input(s): CKTOTAL, CKMB, CKMBINDEX, TROPONINI in the last 168 hours.  BNP (last 3 results)  Recent Labs  01/25/15 2337  BNP 50.0    ProBNP (last 3 results) No results for input(s): PROBNP in the last 8760 hours.  CBG: No results for input(s): GLUCAP in the last 168 hours.  Radiological Exams on Admission: Dg Chest 2 View  01/25/2015  CLINICAL DATA:  Sudden onset of chest pain 3 hours prior. Atrial fibrillation. EXAM: CHEST  2 VIEW COMPARISON:  07/14/2013 FINDINGS: Stable chronic cardiomegaly. No pulmonary edema. Streaky retrocardiac opacity at the left lung base. The right lung is clear. No pleural effusion or pneumothorax. Degenerative change throughout the spine without acute osseous abnormality. IMPRESSION: Stable cardiomegaly. Streaky retrocardiac opacity is likely atelectasis versus early pneumonia.  Electronically Signed   By: Jeb Levering M.D.   On: 01/25/2015 23:56    EKG: Independently reviewed. A. fib with RVR.  Assessment/Plan Principal Problem:   Atrial fibrillation with RVR (HCC) Active Problems:   Asthma   HTN (hypertension)   1. Atrial fibrillation with RVR - patient has converted back to sinus rhythm after Cardizem 20 mg IV bolus. On-call cardiologist Dr. Christa See was consulted by ER physician. Cardiologist has recommended to place patient on heparin. Patient's chads 2 vasc score is 2. Patient is on heparin. Patient's heart rate at this time is around 60 bpm. Closely observe. Check thyroid function tests 2-D echo cardiac markers and cardiology consult. 2. Lower extremity edema which runs of breath - symptoms are concerning for CHF. Check 2-D echo. I also ordered lower extremity Dopplers. 3. History of asthma - on Singulair. Presently not wheezing. 4. History of OSA noncompliant with C Pap.  I have reviewed patient's old charts and labs. I have personally reviewed patient's chest x-ray and EKG.   DVT Prophylaxis heparin infusion.  Code Status: DO NOT RESUSCITATE.  Family Communication: Patient's wife at bedside.  Disposition Plan: Admit to inpatient.    Carlyn Lemke N. [facility] Pager [phone].  If 7PM-7AM, please contact night-coverage [email or web address] Password [email or web address] 01/26/2015, 2:55 AM

## 2015-01-26 NOTE — Discharge Summary (Signed)
Discharge Summary  [name] [name] AOZ:[id number] DOB: 26-Jan-1941  PCP: Simona Huh, MD  Admit date: 01/25/2015 Discharge date: 01/26/2015  Time spent: >8mns  Recommendations for Outpatient Follow-up:  1. F/u with PMD within a week for hospital discharge follow up 2. F/u with cardiology Dr. STamala Julianfor afib  Discharge Diagnoses:  Active Hospital Problems   Diagnosis Date Noted  . Atrial fibrillation with RVR ([place] 01/26/2015  . HTN (hypertension) 09/06/2011  . Asthma 04/23/2011    Resolved Hospital Problems   Diagnosis Date Noted Date Resolved  No resolved problems to display.    Discharge Condition: stable  Diet recommendation: heart healthy/carb modified  Filed Weights   01/26/15 0207  Weight: 242 lb 11.2 oz (110.088 kg)    History of present illness:  [name] HASHIMIis a 74y.o. male with history of hypertension, OSA, asthma presents to the ER because of chest pain and palpitations. Patient's symptoms started last night around 9 PM. Patient started having cramping chest pain with palpitations. Patient called EMS and was brought to the ER. In the ER patient was found to be A. fib with RVR. Cardizem 20 mg IV bolus was given following which patient's heart rate got controlled and converted to sinus rhythm. Patient has been having off-and-on palpitations and had recently followed up with cardiologist Dr. STamala Julianand was originally planned to have 2-D echo and Holter monitor done as outpatient. Patient has had a cardiac cath in 2013 which was unremarkable. Patient recently has been also having exertional shortness of breath with lower extremity edema. Otherwise patient denies any nausea vomiting abdominal pain and diarrhea. Recently patient had upper respiratory tract infection like symptoms.   Hospital Course:  Principal Problem:   Atrial fibrillation with RVR (HCC) Active Problems:   Asthma   HTN (hypertension)  Afib/RVR: concerted to sinus rhythm after iv  cardizem in the ED, echo with adequate LVEF (60-65%), started cardizem 120 q24hrs, elliquis per cardiology recommendation, cleared to discharge home by cardiology, [facility].  Obstructive sleep apnea, declined cpap, encourage patient to reconsider it due to osa is one of the  Risk factor for afib.  HTN, reduce micardis from '80mg'$  po qd to '40mg'$  po qd, started cardizem. Instructed home bp monitoring.  H/o skin cancer s/p resection under close surveillance at [facility] q39month  Bilateral lower extremity edema per hpi, has resolved when i exam the patient. Venous doppler negative for DVT.    Procedures:  None  Consultations:  cardiology  Discharge Exam: BP 111/48 mmHg  Pulse 69  Temp(Src) 97.9 F (36.6 C) (Oral)  Resp 14  Ht '5\' 7"'$  (1.702 m)  Wt 242 lb 11.2 oz (110.088 kg)  BMI 38.00 kg/m2  SpO2 100%   General: Moderately built and nourished.  Eyes: Anicteric no pallor.  ENT: No discharge from the ears eyes nose and mouth.  Neck: No mass felt. No JVD appreciated.  Cardiovascular: S1 and S2 heard. RRR.  Respiratory: No rhonchi or crepitations.  Abdomen: Soft nontender bowel sounds present.  Skin: No rash.  Musculoskeletal: Bilateral lower extremity edema has resolved  Psychiatric: Appears normal.  Neurologic: Alert awake oriented to time place and person. Moves all extremities.   Discharge Instructions You were cared for by a hospitalist during your hospital stay. If you have any questions about your discharge medications or the care you received while you were in the hospital after you are discharged, you can call the  unit and asked to speak with the hospitalist on call if the hospitalist that took care of you is not available. Once you are discharged, your primary care physician will handle any further medical issues. Please note that NO REFILLS for any discharge medications will be authorized once you  are discharged, as it is imperative that you return to your primary care physician (or establish a relationship with a primary care physician if you do not have one) for your aftercare needs so that they can reassess your need for medications and monitor your lab values.      Discharge Instructions    Diet - low sodium heart healthy    Complete by:  As directed      Increase activity slowly    Complete by:  As directed             Medication List    TAKE these medications        apixaban 5 MG Tabs tablet  Commonly known as:  ELIQUIS  Take 1 tablet (5 mg total) by mouth 2 (two) times daily.     Calcium Carbonate-Vitamin D 600-200 MG-UNIT Tabs  Take 1 tablet by mouth 2 (two) times daily.     diltiazem 120 MG 24 hr capsule  Commonly known as:  CARDIZEM CD  Take 1 capsule (120 mg total) by mouth daily.     Fish Oil 1000 MG Caps  Take 1,000 mg by mouth 2 (two) times daily.     fluticasone 50 MCG/ACT nasal spray  Commonly known as:  FLONASE  Place 2 sprays into the nose at bedtime as needed for allergies.     hydroxypropyl methylcellulose / hypromellose 2.5 % ophthalmic solution  Commonly known as:  ISOPTO TEARS / GONIOVISC  Place 1 drop into both eyes 3 (three) times daily as needed. For dry eyes     ibuprofen 200 MG tablet  Commonly known as:  ADVIL,MOTRIN  Take 600 mg by mouth 2 (two) times daily as needed. (pain)     montelukast 10 MG tablet  Commonly known as:  SINGULAIR  Take 10 mg by mouth daily before breakfast.     Multiple Vitamins tablet  Take 1 tablet by mouth daily.     NYQUIL PO  Take 30 mLs by mouth at bedtime as needed (cold symptoms).     omeprazole 40 MG capsule  Commonly known as:  PRILOSEC  Take 40 mg by mouth at bedtime.     telmisartan 80 MG tablet  Commonly known as:  MICARDIS  Take 0.5 tablets (40 mg total) by mouth daily.       Allergies  Allergen Reactions  . Aminophylline Anaphylaxis  . Ampicillin Diarrhea and Nausea And Vomiting      Has patient had a PCN reaction causing immediate rash, facial/tongue/throat swelling, SOB or lightheadedness with hypotension: No Has patient had a PCN reaction causing severe rash involving mucus membranes or skin necrosis: No Has patient had a PCN reaction that required hospitalization No Has patient had a PCN reaction occurring within the last 10 years: No If all of the above answers are "NO", then may proceed with Cephalosporin use.   . Carvedilol     SOB/Fatigue  . Lisinopril Cough  . Tenormin [Atenolol] Other (See Comments)    fatigue  . Zolpidem Tartrate Other (See Comments)    hallucinations   Follow-up Information    Follow up with [facility], NP.   Specialties:  Cardiology, Radiology  Why:  [facility] - Cecilie Kicks, NP on 02/09/15 at 10am. We canceled your event monitor/echo appointments on 11/23 since we were able to diagnose the afib this admission, and since you had echo as an inpatient.   Contact information:   [address] [place] [address] [phone]       Follow up with Simona Huh, MD In 1 week.   Specialty:  Family Medicine   Why:  hospital discharge follow up   Contact information:   [address] [place] [address] [phone]       Follow up with Sinclair Grooms, MD.   Specialty:  Cardiology   Why:  please call to make an appointment with Dr. Micah Flesher information:   [address] [place] [address] [phone]        The results of significant diagnostics from this hospitalization (including imaging, microbiology, ancillary and laboratory) are listed below for reference.    Significant Diagnostic Studies: Dg Chest 2 View  01/25/2015  CLINICAL DATA:  Sudden onset of chest pain 3 hours prior. Atrial fibrillation. EXAM: CHEST  2 VIEW COMPARISON:  07/14/2013 FINDINGS: Stable chronic cardiomegaly. No pulmonary edema. Streaky retrocardiac opacity at the left lung base.  The right lung is clear. No pleural effusion or pneumothorax. Degenerative change throughout the spine without acute osseous abnormality. IMPRESSION: Stable cardiomegaly. Streaky retrocardiac opacity is likely atelectasis versus early pneumonia. Electronically Signed   By: Jeb Levering M.D.   On: 01/25/2015 23:56    Microbiology: No results found for this or any previous visit (from the past 240 hour(s)).   Labs: Basic Metabolic Panel:  Recent Labs Lab 01/25/15 2337 01/25/15 2346 01/25/15 2347 01/26/15 0330  NA 142 144  --  142  K 3.6 3.6  --  3.5  CL 108 106  --  109  CO2 26  --   --  29  GLUCOSE 139* 138*  --  106*  BUN 18 18  --  19  CREATININE 1.12 1.20  --  1.09  CALCIUM 9.4  --   --  8.6*  MG  --   --  2.0  --    Liver Function Tests:  Recent Labs Lab 01/26/15 0330  AST 18  ALT 11*  ALKPHOS 56  BILITOT 1.0  PROT 6.2*  ALBUMIN 3.5   No results for input(s): LIPASE, AMYLASE in the last 168 hours. No results for input(s): AMMONIA in the last 168 hours. CBC:  Recent Labs Lab 01/25/15 2337 01/25/15 2346 01/26/15 0330  WBC 9.9  --  6.6  NEUTROABS 7.0  --  4.1  HGB 14.8 15.3 12.6*  HCT 42.8 45.0 37.3*  MCV 87.9  --  90.5  PLT 161  --  156   Cardiac Enzymes:  Recent Labs Lab 01/26/15 0330 01/26/15 0858  TROPONINI <0.03 <0.03   BNP: BNP (last 3 results)  Recent Labs  01/25/15 2337  BNP 50.0    ProBNP (last 3 results) No results for input(s): PROBNP in the last 8760 hours.  CBG: No results for input(s): GLUCAP in the last 168 hours.     SignedFlorencia Reasons MD, PhD  [facility] 01/26/2015, 1:58 PM

## 2015-01-26 NOTE — Progress Notes (Signed)
ANTICOAGULATION CONSULT NOTE - Initial Consult  Pharmacy Consult for IV Heparin Indication: A-fib  Allergies  Allergen Reactions  . Aminophylline Anaphylaxis  . Ampicillin Diarrhea and Nausea And Vomiting    Has patient had a PCN reaction causing immediate rash, facial/tongue/throat swelling, SOB or lightheadedness with hypotension: No Has patient had a PCN reaction causing severe rash involving mucus membranes or skin necrosis: No Has patient had a PCN reaction that required hospitalization No Has patient had a PCN reaction occurring within the last 10 years: No If all of the above answers are "NO", then may proceed with Cephalosporin use.   . Carvedilol     SOB/Fatigue  . Lisinopril Cough  . Tenormin [Atenolol] Other (See Comments)    fatigue  . Zolpidem Tartrate Other (See Comments)    hallucinations    Patient Measurements: Height: '5\' 7"'$  (170.2 cm) Weight: 242 lb 11.2 oz (110.088 kg) IBW/kg (Calculated) : 66.1 Heparin Dosing Weight: 79 kg  Vital Signs: Temp: 97.8 F (36.6 C) (11/18 0207) Temp Source: Oral (11/18 0207) BP: 120/58 mmHg (11/18 0207) Pulse Rate: 68 (11/18 0207)  Labs:  Recent Labs  01/25/15 2337 01/25/15 2346  HGB 14.8 15.3  HCT 42.8 45.0  PLT 161  --   CREATININE 1.12 1.20    Estimated Creatinine Clearance: 62 mL/min (by C-G formula based on Cr of 1.2).   Medical History: Past Medical History  Diagnosis Date  . Hypertension   . Dysrhythmia     palpitations, followed by Dr.Ehinger, seen in prep for surgery on 03/27/2011   . Angina     chest pain- cardiac cath. followed in 2010, record available ,  told then that he should f/u /w Dr. Marisue Humble    . Asthma     uses  singulair  . Pneumonia     hosp. 20 yrs. ago  . Sleep apnea     study done, 10 yrs. ago, told that he needed  CPAP but  never used   . Hepatitis     jaundice- many yrs. ago  . Chronic kidney disease     prostate cancer - surg. removal- 1996  . Arthritis     R knee, back     . Hiatal hernia   . GERD (gastroesophageal reflux disease)   . Diverticulitis   . Colon polyps   . Cancer [facility])     prostate, melanoma- 2010, excision    . Prostate cancer ([place]) 1995  . Melanoma ([place])     Face    Medications:  Prescriptions prior to admission  Medication Sig Dispense Refill Last Dose  . Calcium Carbonate-Vitamin D (CALCIUM + D) 600-200 MG-UNIT TABS Take 1 tablet by mouth 2 (two) times daily.   01/25/2015 at Unknown time  . fluticasone (FLONASE) 50 MCG/ACT nasal spray Place 2 sprays into the nose at bedtime as needed for allergies.    Past Week at Unknown time  . hydroxypropyl methylcellulose (ISOPTO TEARS) 2.5 % ophthalmic solution Place 1 drop into both eyes 3 (three) times daily as needed. For dry eyes   01/25/2015 at Unknown time  . ibuprofen (ADVIL,MOTRIN) 200 MG tablet Take 600 mg by mouth 2 (two) times daily as needed. (pain)   Past Week at Unknown time  . montelukast (SINGULAIR) 10 MG tablet Take 10 mg by mouth daily before breakfast.    01/25/2015 at Unknown time  . Multiple Vitamins tablet Take 1 tablet by mouth daily.   01/25/2015 at Unknown time  . Omega-3 Fatty  Acids (FISH OIL) 1000 MG CAPS Take 1,000 mg by mouth 2 (two) times daily.   01/25/2015 at Unknown time  . omeprazole (PRILOSEC) 40 MG capsule Take 40 mg by mouth at bedtime.    Past Week at Unknown time  . Pseudoeph-Doxylamine-DM-APAP (NYQUIL PO) Take 30 mLs by mouth at bedtime as needed (cold symptoms).   Past Week at Unknown time  . telmisartan (MICARDIS) 80 MG tablet Take 80 mg by mouth daily.   01/25/2015 at Unknown time   Scheduled:  . irbesartan  300 mg Oral Daily  . montelukast  10 mg Oral QAC breakfast  . omega-3 acid ethyl esters  1,000 mg Oral Daily  . pantoprazole  40 mg Oral Daily  . sodium chloride  3 mL Intravenous Q12H   Infusions:    Assessment: 9 yoM c/o CP and palpitations.  IV Heparin per Rx for A-fib.  Goal of Therapy:  Heparin level 0.3-0.7 units/ml Monitor platelets by  anticoagulation protocol: Yes   Plan:   Baseline coags stat  Heparin 3000 units x1 then 1100 units/hr  Daily CBC/HL  Check 1st HL at 1300 today  Dorrene German 01/26/2015,3:08 AM

## 2015-01-27 ENCOUNTER — Telehealth: Payer: Self-pay | Admitting: Physician Assistant

## 2015-01-27 LAB — T3, FREE: T3 FREE: 3.1 pg/mL (ref 2.0–4.4)

## 2015-01-27 NOTE — Telephone Encounter (Signed)
Ms. Tucholski called regarding the Eliquis.  Her husband was recently discharged and when she went to get the Eliquis filled, she was told it would be over $300 a month. The pharmacy told her that prior authorization would be required for the insurance to cover it. She has a prescription and card for a 30 day free supply, but was told this was for the second month, not the first month so she has not yet used it. She got 4 pills so he has not missed a dose.  Advised her to use the 30 days free card as soon as possible. Next week, the office can call and get the prior authorization so that the insurance will cover this medication. If this is not possible, the 30 days will give [place] time to make other arrangements.  The patient was relieved and stated she would do this. She will call us back if there are any other issues or concerns.

## 2015-01-29 ENCOUNTER — Telehealth: Payer: Self-pay

## 2015-01-29 NOTE — Telephone Encounter (Signed)
Checked with Linda,LPN in our prior auth dept., she sts that seh has not yet received a prior auth for pt Eliquis.  Called and spoke with pt wife, the Rx for Eliquis was dropped off to pt pharmacy late Fri afternoon. They were given a free 30day supply of Eliquis. Our office has not yet received pt prior auth.Adv her that I have spoken with Linda,LPN and she will be on the look out for pt prior auth request from pt pharmacy.

## 2015-01-29 NOTE — Progress Notes (Signed)
    Discussed with hospitalist.  Consult done this AM.  Plan for Diltiazem 120 mg daily and then Eliquis for anticoagulation.    Decrease micardis to 40 mg daily to avoid hypotension.  F/u with Dr. Tamala Julian.  Jettie Booze, MD

## 2015-01-29 NOTE — Telephone Encounter (Signed)
Lets check to see what has happened with Eliquis.

## 2015-01-29 NOTE — Telephone Encounter (Signed)
Prior auth for Eliquis 5 mg sent to [facility].

## 2015-01-30 ENCOUNTER — Telehealth: Payer: Self-pay

## 2015-01-30 NOTE — Telephone Encounter (Signed)
Eliquis '5mg'$  approved. PA # [id number]. Good through 01/27/2016.

## 2015-01-31 ENCOUNTER — Other Ambulatory Visit (HOSPITAL_COMMUNITY): Payer: Medicare Other

## 2015-02-09 ENCOUNTER — Ambulatory Visit (INDEPENDENT_AMBULATORY_CARE_PROVIDER_SITE_OTHER): Payer: Medicare Other | Admitting: Cardiology

## 2015-02-09 ENCOUNTER — Encounter: Payer: Self-pay | Admitting: Cardiology

## 2015-02-09 VITALS — BP 118/68 | HR 61 | Ht 67.0 in | Wt 248.8 lb

## 2015-02-09 DIAGNOSIS — I10 Essential (primary) hypertension: Secondary | ICD-10-CM

## 2015-02-09 DIAGNOSIS — G4733 Obstructive sleep apnea (adult) (pediatric): Secondary | ICD-10-CM

## 2015-02-09 DIAGNOSIS — I480 Paroxysmal atrial fibrillation: Secondary | ICD-10-CM

## 2015-02-09 DIAGNOSIS — I4891 Unspecified atrial fibrillation: Secondary | ICD-10-CM

## 2015-02-09 DIAGNOSIS — R5383 Other fatigue: Secondary | ICD-10-CM

## 2015-02-09 DIAGNOSIS — R002 Palpitations: Secondary | ICD-10-CM

## 2015-02-09 MED ORDER — DILTIAZEM HCL ER COATED BEADS 120 MG PO CP24: 120.0000 mg | ORAL_CAPSULE | Freq: Every day | ORAL | Status: DC

## 2015-02-09 NOTE — Patient Instructions (Addendum)
  Medication Instructions:  Your physician recommends that you continue on your current medications as directed. Please refer to the Current Medication list given to you today. 1. Your Dilitazem was refilled today at office visit with a 90 day supply 2. Samples of Eliquis were given today at office visit/Do not miss Eliquis. 3.  Do not take pseudoephedrine-  Do not take Day time Nyquil  Labwork: None  Testing/Procedures: None  Follow-Up: Your physician recommends that you keep your scheduled  follow-up appointment with Dr. Tamala Julian in March.   Any Other Special Instructions Will Be Listed Below (If Applicable).   Buy knee high support stockings for the lower leg edema.    Call if chest pain/discomfort returns or recurrent palpitations to get sooner appointment with Dr. Tamala Julian.    If you need a refill on your cardiac medications before your next appointment, please call your pharmacy.

## 2015-02-09 NOTE — Progress Notes (Signed)
Cardiology Office Note   Date:  02/09/2015   ID:  [name] [name], [name] [name] [birth year], MRN [id number]  PCP:  Simona Huh, MD  Cardiologist:  Dr. Tamala Julian    Chief Complaint  Patient presents with  . Atrial Fibrillation  . Hypertension      History of Present Illness: [name] [name] is a 74 y.o. male who presents for post hospitalization 01/26/15-11/  /16.  He had chest pain sharp radiating across his chest.  He was found to be in a fib with : CHADS2VASC = 3 (age, hypertension).  IV dilt to control rate and he converted to SR.  He was placed on po cardizem, and eliquis.  He has OSA and declined cpap.  Also with lower ext edema and neg venous doppler for DVt.      He has hx of a coronary angiogram in 2013 with clean/patent coronary arteries  ECHO 01/2015: - Left ventricle: The cavity size was normal. Wall thickness was increased in a pattern of mild LVH. Systolic function was normal. The estimated ejection fraction was in the range of 60% to 65%. - Mitral valve: There was mild regurgitation. - Left atrium: The atrium was mildly dilated.  Today:he is feeling tired, but no chest pain, he did have post hospital but he took a lot of maalox and discomfort resolved.  He has a hiatal hernia and sleeps in a lounge chair.  he is on prilosec.    He has felt skipped beats at times.  But no awareness of recurrent a fib. His lower ext edema is stable, worse in evenings.    He is for split night sleep study in Jan.  For his OSA.  He is not sure of the validity of previous study.  We discussed his arthritic pain he takes motrin for.  He will try extra strength tylenol instead, he could use rare motrin for continued pain. He also has pain pills at home that he may use.   They are requesting samples of eliquis to get them through until Jan when co pay is less.    Past Medical History  Diagnosis Date  . Hypertension   . Dysrhythmia     palpitations, followed by Dr.Ehinger, seen in  prep for surgery on 03/27/2011   . Angina     chest pain- cardiac cath. followed in 2010, record available ,  told then that he should f/u /w Dr. Marisue Humble    . Asthma     uses  singulair  . Pneumonia     hosp. 20 yrs. ago  . Sleep apnea     study done, 10 yrs. ago, told that he needed  CPAP but  never used   . Hepatitis     jaundice- many yrs. ago  . Chronic kidney disease     prostate cancer - surg. removal- 1996  . Arthritis     R knee, back    . Hiatal hernia   . GERD (gastroesophageal reflux disease)   . Diverticulitis   . Colon polyps   . Cancer [facility])     prostate, melanoma- 2010, excision    . Prostate cancer ([place]) 1995  . Melanoma ([place])     Face    Past Surgical History  Procedure Laterality Date  . Cardiac catheterization      2010  . Tonsillectomy      as child  . Fracture surgery      L wrist, hardware- 1982  . Joint replacement  L knee, 2009  . Prostatectomy      for ca  . Total knee arthroplasty  04/22/2011    Procedure: TOTAL KNEE ARTHROPLASTY;  Surgeon: Garald Balding, MD;  Location: [place];  Service: Orthopedics;  Laterality: Right;  . Left heart catheterization with coronary angiogram N/A 09/08/2011    Procedure: LEFT HEART CATHETERIZATION WITH CORONARY ANGIOGRAM;  Surgeon: Sinclair Grooms, MD;  Location: [facility] CATH LAB;  Service: Cardiovascular;  Laterality: N/A;     Current Outpatient Prescriptions  Medication Sig Dispense Refill  . apixaban (ELIQUIS) 5 MG TABS tablet Take 1 tablet (5 mg total) by mouth 2 (two) times daily. 60 tablet 9  . Calcium Carbonate-Vitamin D (CALCIUM + D) 600-200 MG-UNIT TABS Take 1 tablet by mouth 2 (two) times daily.    Marland Kitchen diltiazem (CARDIZEM CD) 120 MG 24 hr capsule Take 1 capsule (120 mg total) by mouth daily. 30 capsule 0  . fluticasone (FLONASE) 50 MCG/ACT nasal spray Place 2 sprays into the nose at bedtime as needed for allergies.     . hydroxypropyl methylcellulose (ISOPTO TEARS) 2.5 % ophthalmic solution Place 1  drop into both eyes 3 (three) times daily as needed. For dry eyes    . ibuprofen (ADVIL,MOTRIN) 200 MG tablet Take 600 mg by mouth 2 (two) times daily as needed. (pain)    . montelukast (SINGULAIR) 10 MG tablet Take 10 mg by mouth daily before breakfast.     . Multiple Vitamins tablet Take 1 tablet by mouth daily.    . Omega-3 Fatty Acids (FISH OIL) 1000 MG CAPS Take 1,000 mg by mouth 2 (two) times daily.    Marland Kitchen omeprazole (PRILOSEC) 40 MG capsule Take 40 mg by mouth at bedtime.     . Pseudoeph-Doxylamine-DM-APAP (NYQUIL PO) Take 30 mLs by mouth at bedtime as needed (cold symptoms).    Marland Kitchen telmisartan (MICARDIS) 80 MG tablet Take 0.5 tablets (40 mg total) by mouth daily. 30 tablet 0   No current facility-administered medications for this visit.    Allergies:   Aminophylline; Ampicillin; Carvedilol; Lisinopril; Tenormin; and Zolpidem tartrate    Social History:  The patient  reports that he quit smoking about 31 years ago. He has never used smokeless tobacco. He reports that he does not drink alcohol or use illicit drugs.   Family History:  The patient's family history includes Alzheimer's disease in his father; Breast cancer in his mother; Crohn's disease in his brother; Heart disease in his brother; Hypertension in his brother. There is no history of Anesthesia problems, Hypotension, Malignant hyperthermia, or Pseudochol deficiency.    ROS:  General:no colds or fevers, no weight changes Skin:no rashes or ulcers HEENT:no blurred vision, no congestion CV:see HPI PUL:see HPI GI:no diarrhea constipation or melena, + indigestion with hx of hiatal hernia  GU:no hematuria, no dysuria MS:+ knee joint pain, back pain-chronic no claudication Neuro:no syncope, no lightheadedness Endo:no diabetes, no thyroid disease normal TSH  Wt Readings from Last 3 Encounters:  02/09/15 248 lb 12.8 oz (112.855 kg)  01/26/15 242 lb 11.2 oz (110.088 kg)  01/23/15 249 lb 3.2 oz (113.036 kg)     PHYSICAL  EXAM: VS:  BP 118/68 mmHg  Pulse 61  Ht '5\' 7"'$  (1.702 m)  Wt 248 lb 12.8 oz (112.855 kg)  BMI 38.96 kg/m2 , BMI Body mass index is 38.96 kg/(m^2). General:Pleasant affect, NAD Skin:Warm and dry, brisk capillary refill HEENT:normocephalic, sclera clear, mucus membranes moist Neck:supple, no JVD, no bruits  Heart:S1S2 RRR  without murmur, gallup, rub or click Lungs:clear without rales, rhonchi, or wheezes CMK:LKJZ, non tender, + BS, do not palpate liver spleen or masses Ext:+ lower ext edema, 2+ pedal pulses, 2+ radial pulses Neuro:alert and oriented, MAE, follows commands, + facial symmetry    EKG:  EKG is ordered today. The ekg ordered today demonstrates SR with incomplete RT BBB  HR 61.    Recent Labs: 01/25/2015: B Natriuretic Peptide 50.0; Magnesium 2.0 01/26/2015: ALT 11*; BUN 19; Creatinine, Ser 1.09; Hemoglobin 12.6*; Platelets 156; Potassium 3.5; Sodium 142; TSH 2.270    Lipid Panel No results found for: CHOL, TRIG, HDL, CHOLHDL, VLDL, LDLCALC, LDLDIRECT     Other studies Reviewed: Additional studies/ records that were reviewed today include: echo, hospital notes, previous notes and caths. .   ASSESSMENT AND PLAN:  1.  PAF converted to SR on IV dilt.  Now on dilt po and eliquis.  Maintaining SR with what sounds like PACs.  Will discuss with Dr. Tamala Julian if event monitor needed to eval A fib burden.  With his fatigue doubt we can increase dilt.     2. OSA declined CPAP. He is for follow up sleep study in Jan.   3.  HTN- controlled  4.  Last cardiac cath 2013 with no significant disease The right coronary contains diffuse irregularities proximal mid and distal vessel. The left coronary is essentially normal.    5. Anticoagulation continue eliquis.   6.  Fatigue seems to have increased on cardizem- OSA may be impacting.     7. Lower ext edema - asked him to wear support stockings- edema improves overnight. .    Current medicines are reviewed with the patient  today.  The patient Has no concerns regarding medicines.  The following changes have been made:  See above Labs/ tests ordered today include:see above  Disposition:   FU:  see above  Signed, Isaiah Serge, NP  02/09/2015 10:41 AM    [facility] [address] [place], [place], [place], [place] Phone: [phone]; Fax: [phone]

## 2015-02-14 ENCOUNTER — Telehealth: Payer: Self-pay

## 2015-02-14 DIAGNOSIS — I480 Paroxysmal atrial fibrillation: Secondary | ICD-10-CM

## 2015-02-14 NOTE — Telephone Encounter (Signed)
lmtcb  Received message from Cecilie Kicks, NP Lattie Haw,  could you arrange 30 day event monitor for PAF for Mr [name] [name], just let him know I checked with Dr. Tamala Julian and he wanted to see if he was having any breakthrough a fib.

## 2015-02-14 NOTE — Telephone Encounter (Signed)
Pt aware of Dr.Smith's recommendation for him to wear a 30day event monitor. Dx AFib Adv pt a scheduler from our office will call him to schedule his monitor. Pt agreeable with plan and verbalized understanding.

## 2015-02-15 ENCOUNTER — Ambulatory Visit (INDEPENDENT_AMBULATORY_CARE_PROVIDER_SITE_OTHER): Payer: Medicare Other

## 2015-02-15 DIAGNOSIS — I480 Paroxysmal atrial fibrillation: Secondary | ICD-10-CM

## 2015-02-26 ENCOUNTER — Other Ambulatory Visit (INDEPENDENT_AMBULATORY_CARE_PROVIDER_SITE_OTHER): Payer: Medicare Other

## 2015-02-26 ENCOUNTER — Encounter: Payer: Self-pay | Admitting: Pulmonary Disease

## 2015-02-26 ENCOUNTER — Ambulatory Visit (INDEPENDENT_AMBULATORY_CARE_PROVIDER_SITE_OTHER): Payer: Medicare Other | Admitting: Pulmonary Disease

## 2015-02-26 VITALS — BP 132/66 | HR 66 | Ht 67.0 in | Wt 251.0 lb

## 2015-02-26 DIAGNOSIS — J45901 Unspecified asthma with (acute) exacerbation: Secondary | ICD-10-CM | POA: Insufficient documentation

## 2015-02-26 DIAGNOSIS — R0600 Dyspnea, unspecified: Secondary | ICD-10-CM | POA: Diagnosis not present

## 2015-02-26 DIAGNOSIS — J45909 Unspecified asthma, uncomplicated: Secondary | ICD-10-CM

## 2015-02-26 DIAGNOSIS — J441 Chronic obstructive pulmonary disease with (acute) exacerbation: Secondary | ICD-10-CM | POA: Insufficient documentation

## 2015-02-26 LAB — CBC WITH DIFFERENTIAL/PLATELET
BASOS ABS: 0 10*3/uL (ref 0.0–0.1)
Basophils Relative: 0.3 % (ref 0.0–3.0)
Eosinophils Absolute: 0.1 10*3/uL (ref 0.0–0.7)
Eosinophils Relative: 1.6 % (ref 0.0–5.0)
HCT: 43.5 % (ref 39.0–52.0)
HEMOGLOBIN: 14.4 g/dL (ref 13.0–17.0)
LYMPHS ABS: 1.7 10*3/uL (ref 0.7–4.0)
Lymphocytes Relative: 20.2 % (ref 12.0–46.0)
MCHC: 33.1 g/dL (ref 30.0–36.0)
MCV: 90.6 fl (ref 78.0–100.0)
MONO ABS: 0.6 10*3/uL (ref 0.1–1.0)
Monocytes Relative: 7.5 % (ref 3.0–12.0)
NEUTROS PCT: 70.4 % (ref 43.0–77.0)
Neutro Abs: 5.9 10*3/uL (ref 1.4–7.7)
Platelets: 184 10*3/uL (ref 150.0–400.0)
RBC: 4.8 Mil/uL (ref 4.22–5.81)
RDW: 13.5 % (ref 11.5–15.5)
WBC: 8.3 10*3/uL (ref 4.0–10.5)

## 2015-02-26 MED ORDER — AEROCHAMBER MV MISC: Status: DC

## 2015-02-26 NOTE — Patient Instructions (Signed)
We will call you with the results of the bloodwork Take your Symbicort 2 puffs twice a day with a spacer, no matter how you feel Exercise regularly and try to lose weight I will see you back in 2 months or sooner if needed

## 2015-02-26 NOTE — Progress Notes (Signed)
Subjective:    Patient ID: [name] [name], male    DOB: 1940-06-14, 74 y.o.   MRN: [id number]  HPI Chief Complaint  Patient presents with  . Advice Only    Referred for Asthma by Dr. Marisue Humble.     [name] [name] was referred by his PCP for trouble breathing.  His wife says that this has been going on for four years now and no one has been able to identify the problem.  His PCP has told him that he has asthma and "a touch" of COPD.  "At times I breathe just fine but sometimes if I walk up a slight hill I get more short of breath."  Cold air will make him more short of breath.  As long as he is sitting still he has no problems, but with any activity he feels short of breath.  Carrying groceries will make him more short of breath for him.  He stays active and still cuts his grass.  He says that cutting the grass is hard to do.  He thinks that when the dust is worse in th yard he has a hard time breathing and the mask he wore recently helped.  He thinks that he had asthma as a child.    He has been coughing more lately.  He has had a dry cough.    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  This occurred age 5.  He has a history of melanoma, prostate, and merkel cell carcinoma.  He was under the care of a physician at [facility] and was there on 02/13/2014 and he had a CT scan and was told that they found a "spot on his lungs".  He was told to return in 3 months for a repeat CT scan.   He has been followed with repeated CT's of his chest and he has not been told that there was a recent change.  He has trouble breathing only if he breathes in really cold air.  He denies frank chest pain.  He has some leg swelling.  He is currently wearing a heart rhythm monitor right now and has been followed by Dr. Tamala Julian for that.  He smoked 1-1.5ppd from age 64 until age 68.      Past Medical History  Diagnosis Date  . Hypertension   .  Dysrhythmia     palpitations, followed by Dr.Ehinger, seen in prep for surgery on 03/27/2011   . Angina     chest pain- cardiac cath. followed in 2010, record available ,  told then that he should f/u /w Dr. Marisue Humble    . Asthma     uses  singulair  . Pneumonia     hosp. 20 yrs. ago  . Sleep apnea     study done, 10 yrs. ago, told that he needed  CPAP but  never used   . Hepatitis     jaundice- many yrs. ago  . Chronic kidney disease     prostate cancer - surg. removal- 1996  . Arthritis     R knee, back    . Hiatal hernia   . GERD (gastroesophageal reflux disease)   . Diverticulitis   . Colon polyps   . Cancer [facility])     prostate, melanoma- 2010, excision    . Prostate cancer ([employer]) 1995  . Melanoma ([place])  Face     Family History  Problem Relation Age of Onset  . Anesthesia problems Neg Hx   . Hypotension Neg Hx   . Malignant hyperthermia Neg Hx   . Pseudochol deficiency Neg Hx   . Crohn's disease Brother   . Breast cancer Mother   . Alzheimer's disease Father   . Heart disease Brother   . Hypertension Brother      Social History   Social History  . Marital Status: Married    Spouse Name: N/A  . Number of Children: N/A  . Years of Education: N/A   Occupational History  . Not on file.   Social History Main Topics  . Smoking status: Former Smoker -- 1.50 packs/day for 30 years    Types: Cigarettes    Quit date: 04/11/1983  . Smokeless tobacco: Never Used  . Alcohol Use: No  . Drug Use: No  . Sexual Activity: No   Other Topics Concern  . Not on file   Social History Narrative     Allergies  Allergen Reactions  . Aminophylline Anaphylaxis  . Ampicillin Diarrhea and Nausea And Vomiting    Has patient had a PCN reaction causing immediate rash, facial/tongue/throat swelling, SOB or lightheadedness with hypotension: No Has patient had a PCN reaction causing severe rash involving mucus membranes or skin necrosis: No Has patient had a PCN reaction that  required hospitalization No Has patient had a PCN reaction occurring within the last 10 years: No If all of the above answers are "NO", then may proceed with Cephalosporin use.   . Carvedilol     SOB/Fatigue  . Lisinopril Cough  . Tenormin [Atenolol] Other (See Comments)    fatigue  . Zolpidem Tartrate Other (See Comments)    hallucinations     Outpatient Prescriptions Prior to Visit  Medication Sig Dispense Refill  . apixaban (ELIQUIS) 5 MG TABS tablet Take 1 tablet (5 mg total) by mouth 2 (two) times daily. 60 tablet 9  . Calcium Carbonate-Vitamin D (CALCIUM + D) 600-200 MG-UNIT TABS Take 1 tablet by mouth 2 (two) times daily.    Marland Kitchen diltiazem (CARDIZEM CD) 120 MG 24 hr capsule Take 1 capsule (120 mg total) by mouth daily. 90 capsule 3  . fluticasone (FLONASE) 50 MCG/ACT nasal spray Place 2 sprays into the nose at bedtime as needed for allergies.     . hydroxypropyl methylcellulose (ISOPTO TEARS) 2.5 % ophthalmic solution Place 1 drop into both eyes 3 (three) times daily as needed. For dry eyes    . montelukast (SINGULAIR) 10 MG tablet Take 10 mg by mouth daily before breakfast.     . Multiple Vitamins tablet Take 1 tablet by mouth daily.    . Omega-3 Fatty Acids (FISH OIL) 1000 MG CAPS Take 1,000 mg by mouth 2 (two) times daily.    Marland Kitchen omeprazole (PRILOSEC) 40 MG capsule Take 40 mg by mouth at bedtime.     Marland Kitchen telmisartan (MICARDIS) 80 MG tablet Take 0.5 tablets (40 mg total) by mouth daily. 30 tablet 0  . ibuprofen (ADVIL,MOTRIN) 200 MG tablet Take 600 mg by mouth 2 (two) times daily as needed. Reported on 02/26/2015    . Pseudoeph-Doxylamine-DM-APAP (NYQUIL PO) Take 30 mLs by mouth at bedtime as needed (cold symptoms). Reported on 02/26/2015     No facility-administered medications prior to visit.       Review of Systems  Constitutional: Negative for fever and unexpected weight change.  HENT: Negative for congestion, dental  problem, ear pain, nosebleeds, postnasal drip, rhinorrhea,  sinus pressure, sneezing, sore throat and trouble swallowing.   Eyes: Negative for redness and itching.  Respiratory: Positive for shortness of breath and wheezing. Negative for cough and chest tightness.   Cardiovascular: Negative for palpitations and leg swelling.  Gastrointestinal: Negative for nausea and vomiting.  Genitourinary: Negative for dysuria.  Musculoskeletal: Negative for joint swelling.  Skin: Negative for rash.  Neurological: Negative for headaches.  Hematological: Does not bruise/bleed easily.  Psychiatric/Behavioral: Negative for dysphoric mood. The patient is not nervous/anxious.        Objective:   Physical Exam Filed Vitals:   02/26/15 1426  BP: 132/66  Pulse: 66  Height: '5\' 7"'$  (1.702 m)  Weight: 251 lb (113.853 kg)  SpO2: 95%   Ambulated 500 feet on RA and O2 was OK  Gen: obese but well appearing, no acute distress HENT: NCAT, OP clear, neck supple without masses Eyes: PERRL, EOMi Lymph: no cervical lymphadenopathy PULM: CTA B CV: Irreg irreg, no mgr, no JVD GI: BS+, soft, nontender, no hsm Derm: no rash or skin breakdown MSK: normal bulk and tone Neuro: A&Ox4, CN II-XII intact, strength 5/5 in all 4 extremities Psyche: normal mood and affect   02/14/2015 CT scan Chest[facility]Ab/Pelvis [facility]: 1. There are several ill-defined hypodense lesions in the liver which are incompletely characterized on this exam. A hyperenhancing lesion in the right lobe of the liver may represent a perfusion anomaly or flash filling hemangioma. Recommend further evaluation with liver MRI. 2. A groundglass opacity in the right upper lobe measuring 10 mm is suspicious for adenocarcinoma in situ. Attention recommended on follow-up.      Assessment & Plan:  Dyspnea He has had fairly persistent dyspnea symptoms over the last several months which has not progressed. This is typically associated with several episodes of bronchitis throughout the years. He has been labeled as  having asthma and his May 2015 pulmonary function testing was consistent with that diagnosis. Notably, there is no evidence of a pulmonary parenchymal disease on his chest x-ray and his total lung capacity and diffusion capacity were normal a year ago. Even though he smoked years ago he has reversible airflow obstruction site think the most likely diagnosis here is asthma rather than COPD. He has not been treated with a controller therapy up until this point. Sometimes patients who have asthma will "fall off a cliff" or another words have worsening control in the setting of a new environmental allergen, waking, or acid reflux among others. He did not know how to appropriately use his Symbicort inhaler so we discussed this today.  Plan: Try using Symbicort ineffective manner with a spacer for a few months Lose weight Check blood work to look for evidence of [place] systemic allergy (IgE, CBC with differential, RAST Profile) Follow-up 2-3 months, if no improvement then can consider repeating pulmonary function testing and lung imaging at that point     Current outpatient prescriptions:  .  albuterol (PROVENTIL HFA;VENTOLIN HFA) 108 (90 BASE) MCG/ACT inhaler, Inhale 2 puffs into the lungs every 6 (six) hours as needed for wheezing or shortness of breath., Disp: , Rfl:  .  apixaban (ELIQUIS) 5 MG TABS tablet, Take 1 tablet (5 mg total) by mouth 2 (two) times daily., Disp: 60 tablet, Rfl: 9 .  Calcium Carbonate-Vitamin D (CALCIUM + D) 600-200 MG-UNIT TABS, Take 1 tablet by mouth 2 (two) times daily., Disp: , Rfl:  .  diltiazem (CARDIZEM CD) 120 MG 24  hr capsule, Take 1 capsule (120 mg total) by mouth daily., Disp: 90 capsule, Rfl: 3 .  fluticasone (FLONASE) 50 MCG/ACT nasal spray, Place 2 sprays into the nose at bedtime as needed for allergies. , Disp: , Rfl:  .  hydroxypropyl methylcellulose (ISOPTO TEARS) 2.5 % ophthalmic solution, Place 1 drop into both eyes 3 (three) times daily as needed. For dry eyes,  Disp: , Rfl:  .  montelukast (SINGULAIR) 10 MG tablet, Take 10 mg by mouth daily before breakfast. , Disp: , Rfl:  .  Multiple Vitamins tablet, Take 1 tablet by mouth daily., Disp: , Rfl:  .  Omega-3 Fatty Acids (FISH OIL) 1000 MG CAPS, Take 1,000 mg by mouth 2 (two) times daily., Disp: , Rfl:  .  omeprazole (PRILOSEC) 40 MG capsule, Take 40 mg by mouth at bedtime. , Disp: , Rfl:  .  telmisartan (MICARDIS) 80 MG tablet, Take 0.5 tablets (40 mg total) by mouth daily., Disp: 30 tablet, Rfl: 0 .  Spacer/Aero-Holding Chambers (AEROCHAMBER MV) inhaler, Use as instructed, Disp: 1 each, Rfl: 0

## 2015-02-26 NOTE — Assessment & Plan Note (Signed)
He has had fairly persistent dyspnea symptoms over the last several months which has not progressed. This is typically associated with several episodes of bronchitis throughout the years. He has been labeled as having asthma and his May 2015 pulmonary function testing was consistent with that diagnosis. Notably, there is no evidence of a pulmonary parenchymal disease on his chest x-ray and his total lung capacity and diffusion capacity were normal a year ago. Even though he smoked years ago he has reversible airflow obstruction site think the most likely diagnosis here is asthma rather than COPD. He has not been treated with a controller therapy up until this point. Sometimes patients who have asthma will "fall off a cliff" or another words have worsening control in the setting of a new environmental allergen, waking, or acid reflux among others. He did not know how to appropriately use his Symbicort inhaler so we discussed this today.  Plan: Try using Symbicort ineffective manner with a spacer for a few months Lose weight Check blood work to look for evidence of [place] systemic allergy (IgE, CBC with differential, RAST Profile) Follow-up 2-3 months, if no improvement then can consider repeating pulmonary function testing and lung imaging at that point

## 2015-02-27 LAB — ALLERGY FULL PROFILE
Allergen, D pternoyssinus,d7: <0.1 kU/L
Allergen,Goose feathers, e70: <0.1 kU/L
Aspergillus fumigatus, m3: <0.1 kU/L
Candida Albicans: <0.1 kU/L
Cat Dander: <0.1 kU/L
Common Ragweed: <0.1 kU/L
Curvularia lunata: <0.1 kU/L
D. farinae: <0.1 kU/L
G009 Red Top: <0.1 kU/L
Goldenrod: <0.1 kU/L
IgE (Immunoglobulin E), Serum: 10 kU/L (ref <115)
Plantain: <0.1 kU/L
Stemphylium Botryosum: <0.1 kU/L
Timothy Grass: <0.1 kU/L

## 2015-03-14 ENCOUNTER — Telehealth: Payer: Self-pay | Admitting: Pulmonary Disease

## 2015-03-14 MED ORDER — BUDESONIDE-FORMOTEROL FUMARATE 80-4.5 MCG/ACT IN AERO: 2.0000 | INHALATION_SPRAY | Freq: Two times a day (BID) | RESPIRATORY_TRACT | Status: DC

## 2015-03-14 NOTE — Telephone Encounter (Signed)
Pt returning call and can be reached @ same.[name] [name] ° °

## 2015-03-14 NOTE — Telephone Encounter (Signed)
Spoke with pt, requesting sample of symbicort.  None in the office at this time.  Pt states he needs a rx called in.  This has been sent.  Nothing further needed.

## 2015-03-14 NOTE — Telephone Encounter (Signed)
Left message for patient to call back  

## 2015-03-17 ENCOUNTER — Inpatient Hospital Stay (HOSPITAL_COMMUNITY)
Admission: EM | Admit: 2015-03-17 | Discharge: 2015-03-18 | DRG: 310 | Disposition: A | Discharge status: Home / Self Care | Payer: Medicare Other | Attending: Interventional Cardiology | Admitting: Interventional Cardiology

## 2015-03-17 ENCOUNTER — Emergency Department (HOSPITAL_COMMUNITY): Payer: Medicare Other

## 2015-03-17 ENCOUNTER — Encounter (HOSPITAL_COMMUNITY): Payer: Self-pay | Admitting: Emergency Medicine

## 2015-03-17 DIAGNOSIS — J441 Chronic obstructive pulmonary disease with (acute) exacerbation: Secondary | ICD-10-CM | POA: Diagnosis present

## 2015-03-17 DIAGNOSIS — Z888 Allergy status to other drugs, medicaments and biological substances status: Secondary | ICD-10-CM

## 2015-03-17 DIAGNOSIS — Z6835 Body mass index (BMI) 35.0-35.9, adult: Secondary | ICD-10-CM | POA: Diagnosis not present

## 2015-03-17 DIAGNOSIS — J45909 Unspecified asthma, uncomplicated: Secondary | ICD-10-CM | POA: Diagnosis present

## 2015-03-17 DIAGNOSIS — K219 Gastro-esophageal reflux disease without esophagitis: Secondary | ICD-10-CM | POA: Diagnosis present

## 2015-03-17 DIAGNOSIS — I480 Paroxysmal atrial fibrillation: Secondary | ICD-10-CM | POA: Diagnosis not present

## 2015-03-17 DIAGNOSIS — Z96651 Presence of right artificial knee joint: Secondary | ICD-10-CM | POA: Diagnosis present

## 2015-03-17 DIAGNOSIS — I4891 Unspecified atrial fibrillation: Secondary | ICD-10-CM | POA: Diagnosis not present

## 2015-03-17 DIAGNOSIS — R0600 Dyspnea, unspecified: Secondary | ICD-10-CM | POA: Diagnosis not present

## 2015-03-17 DIAGNOSIS — E669 Obesity, unspecified: Secondary | ICD-10-CM | POA: Diagnosis present

## 2015-03-17 DIAGNOSIS — I10 Essential (primary) hypertension: Secondary | ICD-10-CM | POA: Diagnosis present

## 2015-03-17 DIAGNOSIS — Z8546 Personal history of malignant neoplasm of prostate: Secondary | ICD-10-CM | POA: Diagnosis not present

## 2015-03-17 DIAGNOSIS — Z79899 Other long term (current) drug therapy: Secondary | ICD-10-CM | POA: Diagnosis not present

## 2015-03-17 DIAGNOSIS — IMO0001 Reserved for inherently not codable concepts without codable children: Secondary | ICD-10-CM | POA: Diagnosis present

## 2015-03-17 DIAGNOSIS — Z881 Allergy status to other antibiotic agents status: Secondary | ICD-10-CM | POA: Diagnosis not present

## 2015-03-17 DIAGNOSIS — Z7901 Long term (current) use of anticoagulants: Secondary | ICD-10-CM

## 2015-03-17 DIAGNOSIS — J45901 Unspecified asthma with (acute) exacerbation: Secondary | ICD-10-CM | POA: Diagnosis present

## 2015-03-17 DIAGNOSIS — R0789 Other chest pain: Secondary | ICD-10-CM | POA: Diagnosis present

## 2015-03-17 DIAGNOSIS — N189 Chronic kidney disease, unspecified: Secondary | ICD-10-CM | POA: Diagnosis present

## 2015-03-17 DIAGNOSIS — Z0389 Encounter for observation for other suspected diseases and conditions ruled out: Secondary | ICD-10-CM

## 2015-03-17 DIAGNOSIS — Z85820 Personal history of malignant melanoma of skin: Secondary | ICD-10-CM

## 2015-03-17 DIAGNOSIS — I129 Hypertensive chronic kidney disease with stage 1 through stage 4 chronic kidney disease, or unspecified chronic kidney disease: Secondary | ICD-10-CM | POA: Diagnosis present

## 2015-03-17 DIAGNOSIS — R079 Chest pain, unspecified: Secondary | ICD-10-CM | POA: Diagnosis present

## 2015-03-17 LAB — CBC
HEMATOCRIT: 40.7 % (ref 39.0–52.0)
HEMOGLOBIN: 14 g/dL (ref 13.0–17.0)
MCH: 30.6 pg (ref 26.0–34.0)
MCHC: 34.4 g/dL (ref 30.0–36.0)
MCV: 89.1 fL (ref 78.0–100.0)
Platelets: 164 10*3/uL (ref 150–400)
RBC: 4.57 MIL/uL (ref 4.22–5.81)
RDW: 13.2 % (ref 11.5–15.5)
WBC: 6.4 10*3/uL (ref 4.0–10.5)

## 2015-03-17 LAB — I-STAT TROPONIN, ED
TROPONIN I, POC: 0 ng/mL (ref 0.00–0.08)
Troponin i, poc: 0 ng/mL (ref 0.00–0.08)

## 2015-03-17 LAB — BASIC METABOLIC PANEL
ANION GAP: 10 (ref 5–15)
BUN: 20 mg/dL (ref 6–20)
CALCIUM: 9.8 mg/dL (ref 8.9–10.3)
CO2: 27 mmol/L (ref 22–32)
Chloride: 109 mmol/L (ref 101–111)
Creatinine, Ser: 1.22 mg/dL (ref 0.61–1.24)
GFR, EST NON AFRICAN AMERICAN: 56 mL/min — AB (ref >60)
GLUCOSE: 106 mg/dL — AB (ref 65–99)
POTASSIUM: 3.7 mmol/L (ref 3.5–5.1)
SODIUM: 146 mmol/L — AB (ref 135–145)

## 2015-03-17 LAB — TROPONIN I
Troponin I: <0.03 ng/mL (ref <0.031)
Troponin I: <0.03 ng/mL (ref <0.031)
Troponin I: <0.03 ng/mL (ref <0.031)

## 2015-03-17 LAB — MAGNESIUM: Magnesium: 2 mg/dL (ref 1.7–2.4)

## 2015-03-17 LAB — BRAIN NATRIURETIC PEPTIDE: B NATRIURETIC PEPTIDE 5: 36.7 pg/mL (ref 0.0–100.0)

## 2015-03-17 LAB — PROTIME-INR
INR: 1.32 (ref 0.00–1.49)
PROTHROMBIN TIME: 16.5 s — AB (ref 11.6–15.2)

## 2015-03-17 MED ORDER — ASPIRIN EC 81 MG PO TBEC: 81.0000 mg | DELAYED_RELEASE_TABLET | Freq: Every day | ORAL | Status: DC

## 2015-03-17 MED ORDER — BUDESONIDE-FORMOTEROL FUMARATE 80-4.5 MCG/ACT IN AERO
2.0000 | INHALATION_SPRAY | Freq: Two times a day (BID) | RESPIRATORY_TRACT | Status: DC
  Administered 2015-03-17 – 2015-03-18 (×2): 2 via RESPIRATORY_TRACT
  Filled 2015-03-17: qty 6.9

## 2015-03-17 MED ORDER — ADULT MULTIVITAMIN W/MINERALS CH
1.0000 | ORAL_TABLET | Freq: Every day | ORAL | Status: DC
  Administered 2015-03-17 – 2015-03-18 (×2): 1 via ORAL
  Filled 2015-03-17 (×3): qty 1

## 2015-03-17 MED ORDER — PANTOPRAZOLE SODIUM 40 MG PO TBEC
40.0000 mg | DELAYED_RELEASE_TABLET | Freq: Every day | ORAL | Status: DC
  Administered 2015-03-17: 40 mg via ORAL
  Filled 2015-03-17 (×2): qty 1

## 2015-03-17 MED ORDER — MONTELUKAST SODIUM 10 MG PO TABS
10.0000 mg | ORAL_TABLET | Freq: Every day | ORAL | Status: DC
  Administered 2015-03-17 – 2015-03-18 (×2): 10 mg via ORAL
  Filled 2015-03-17 (×2): qty 1

## 2015-03-17 MED ORDER — NITROGLYCERIN 0.4 MG SL SUBL: 0.4000 mg | SUBLINGUAL_TABLET | SUBLINGUAL | Status: DC | PRN

## 2015-03-17 MED ORDER — ALBUTEROL SULFATE HFA 108 (90 BASE) MCG/ACT IN AERS: 2.0000 | INHALATION_SPRAY | Freq: Four times a day (QID) | RESPIRATORY_TRACT | Status: DC | PRN

## 2015-03-17 MED ORDER — ALPRAZOLAM 0.25 MG PO TABS: 0.2500 mg | ORAL_TABLET | Freq: Two times a day (BID) | ORAL | Status: DC | PRN

## 2015-03-17 MED ORDER — ACETAMINOPHEN 325 MG PO TABS
650.0000 mg | ORAL_TABLET | ORAL | Status: DC | PRN
  Administered 2015-03-17 – 2015-03-18 (×2): 650 mg via ORAL
  Filled 2015-03-17 (×2): qty 2

## 2015-03-17 MED ORDER — APIXABAN 5 MG PO TABS
5.0000 mg | ORAL_TABLET | Freq: Two times a day (BID) | ORAL | Status: DC
  Administered 2015-03-17 – 2015-03-18 (×3): 5 mg via ORAL
  Filled 2015-03-17 (×3): qty 1

## 2015-03-17 MED ORDER — PANTOPRAZOLE SODIUM 40 MG PO TBEC: 40.0000 mg | DELAYED_RELEASE_TABLET | Freq: Every day | ORAL | Status: DC

## 2015-03-17 MED ORDER — FLUTICASONE PROPIONATE 50 MCG/ACT NA SUSP
2.0000 | Freq: Every day | NASAL | Status: DC
  Filled 2015-03-17: qty 16

## 2015-03-17 MED ORDER — OMEGA-3-ACID ETHYL ESTERS 1 G PO CAPS
1.0000 g | ORAL_CAPSULE | Freq: Two times a day (BID) | ORAL | Status: DC
  Administered 2015-03-17 (×2): 1 g via ORAL
  Filled 2015-03-17 (×2): qty 1

## 2015-03-17 MED ORDER — ALBUTEROL SULFATE (2.5 MG/3ML) 0.083% IN NEBU: 3.0000 mL | INHALATION_SOLUTION | Freq: Four times a day (QID) | RESPIRATORY_TRACT | Status: DC | PRN

## 2015-03-17 MED ORDER — IRBESARTAN 75 MG PO TABS
75.0000 mg | ORAL_TABLET | Freq: Every day | ORAL | Status: DC
  Filled 2015-03-17: qty 1

## 2015-03-17 MED ORDER — CALCIUM CARBONATE-VITAMIN D 500-200 MG-UNIT PO TABS
1.0000 | ORAL_TABLET | Freq: Two times a day (BID) | ORAL | Status: DC
  Administered 2015-03-17 – 2015-03-18 (×2): 1 via ORAL
  Filled 2015-03-17 (×2): qty 1

## 2015-03-17 MED ORDER — ONDANSETRON HCL 4 MG/2ML IJ SOLN: 4.0000 mg | Freq: Four times a day (QID) | INTRAMUSCULAR | Status: DC | PRN

## 2015-03-17 MED ORDER — DILTIAZEM HCL ER COATED BEADS 120 MG PO CP24
120.0000 mg | ORAL_CAPSULE | Freq: Every day | ORAL | Status: DC
  Administered 2015-03-17 – 2015-03-18 (×2): 120 mg via ORAL
  Filled 2015-03-17 (×2): qty 1

## 2015-03-17 NOTE — ED Provider Notes (Signed)
CSN: [id number]     Arrival date & time 03/17/15  0401 History   First MD Initiated Contact with Patient 03/17/15 0422     Chief Complaint  Patient presents with  . Chest Pain  . Shortness of Breath     (Consider location/radiation/quality/duration/timing/severity/associated sxs/prior Treatment) Patient is a 75 y.o. male presenting with chest pain and shortness of breath. The history is provided by the patient.  Chest Pain Pain location:  L chest Pain quality: pressure   Pain radiates to:  L shoulder and L arm Pain radiates to the back: no   Pain severity:  Severe Onset quality:  Sudden Timing:  Constant Chronicity:  New Worsened by:  Certain positions Associated symptoms: dizziness and shortness of breath   Associated symptoms: no diaphoresis, no nausea and no near-syncope   Shortness of Breath Associated symptoms: chest pain   Associated symptoms: no diaphoresis     Past Medical History  Diagnosis Date  . Hypertension   . Dysrhythmia     palpitations, followed by Dr.Ehinger, seen in prep for surgery on 03/27/2011   . Angina     chest pain- cardiac cath. followed in 2010, record available ,  told then that he should f/u /w Dr. Marisue Humble    . Asthma     uses  singulair  . Pneumonia     hosp. 20 yrs. ago  . Sleep apnea     study done, 10 yrs. ago, told that he needed  CPAP but  never used   . Hepatitis     jaundice- many yrs. ago  . Chronic kidney disease     prostate cancer - surg. removal- 1996  . Arthritis     R knee, back    . Hiatal hernia   . GERD (gastroesophageal reflux disease)   . Diverticulitis   . Colon polyps   . Cancer [facility])     prostate, melanoma- 2010, excision    . Prostate cancer ([place]) 1995  . Melanoma ([place])     Face   Past Surgical History  Procedure Laterality Date  . Cardiac catheterization      2010  . Tonsillectomy      as child  . Fracture surgery      L wrist, hardware- 1982  . Joint replacement      L knee, 2009  . Prostatectomy       for ca  . Total knee arthroplasty  04/22/2011    Procedure: TOTAL KNEE ARTHROPLASTY;  Surgeon: Garald Balding, MD;  Location: [place];  Service: Orthopedics;  Laterality: Right;  . Left heart catheterization with coronary angiogram N/A 09/08/2011    Procedure: LEFT HEART CATHETERIZATION WITH CORONARY ANGIOGRAM;  Surgeon: Sinclair Grooms, MD;  Location: [facility] CATH LAB;  Service: Cardiovascular;  Laterality: N/A;   Family History  Problem Relation Age of Onset  . Anesthesia problems Neg Hx   . Hypotension Neg Hx   . Malignant hyperthermia Neg Hx   . Pseudochol deficiency Neg Hx   . Crohn's disease Brother   . Breast cancer Mother   . Alzheimer's disease Father   . Heart disease Brother   . Hypertension Brother    Social History  Substance Use Topics  . Smoking status: Former Smoker -- 1.50 packs/day for 30 years    Types: Cigarettes    Quit date: 04/11/1983  . Smokeless tobacco: Never Used  . Alcohol Use: No    Review of Systems  Constitutional: Negative for  diaphoresis.  Respiratory: Positive for shortness of breath.   Cardiovascular: Positive for chest pain. Negative for near-syncope.  Gastrointestinal: Negative for nausea.  Neurological: Positive for dizziness.  All other systems reviewed and are negative.     Allergies  Aminophylline; Ampicillin; Carvedilol; Lisinopril; Tenormin; and Zolpidem tartrate  Home Medications   Prior to Admission medications   Medication Sig Start Date End Date Taking? Authorizing Provider  albuterol (PROVENTIL HFA;VENTOLIN HFA) 108 (90 BASE) MCG/ACT inhaler Inhale 2 puffs into the lungs every 6 (six) hours as needed for wheezing or shortness of breath.   Yes Historical Provider, MD  apixaban (ELIQUIS) 5 MG TABS tablet Take 1 tablet (5 mg total) by mouth 2 (two) times daily. 01/26/15  Yes Florencia Reasons, MD  budesonide-formoterol [facility]) 80-4.5 MCG/ACT inhaler Inhale 2 puffs into the lungs 2 (two) times daily. 03/14/15  Yes Juanito Doom,  MD  Calcium Carbonate-Vitamin D (CALCIUM + D) 600-200 MG-UNIT TABS Take 1 tablet by mouth 2 (two) times daily.   Yes Historical Provider, MD  diltiazem (CARDIZEM CD) 120 MG 24 hr capsule Take 1 capsule (120 mg total) by mouth daily. 02/09/15  Yes Isaiah Serge, NP  fluticasone (FLONASE) 50 MCG/ACT nasal spray Place 2 sprays into the nose at bedtime as needed for allergies.    Yes Historical Provider, MD  hydroxypropyl methylcellulose (ISOPTO TEARS) 2.5 % ophthalmic solution Place 1 drop into both eyes 3 (three) times daily as needed. For dry eyes   Yes Historical Provider, MD  montelukast (SINGULAIR) 10 MG tablet Take 10 mg by mouth daily before breakfast.    Yes Historical Provider, MD  Multiple Vitamins tablet Take 1 tablet by mouth daily.   Yes Historical Provider, MD  Omega-3 Fatty Acids (FISH OIL) 1000 MG CAPS Take 1,000 mg by mouth 2 (two) times daily.   Yes Historical Provider, MD  omeprazole (PRILOSEC) 40 MG capsule Take 40 mg by mouth at bedtime.    Yes Historical Provider, MD  Spacer/Aero-Holding Chambers (AEROCHAMBER MV) inhaler Use as instructed 02/26/15  Yes Juanito Doom, MD  telmisartan (MICARDIS) 80 MG tablet Take 0.5 tablets (40 mg total) by mouth daily. 01/26/15  Yes Florencia Reasons, MD   BP 124/54 mmHg  Pulse 63  Temp(Src) 97.8 F (36.6 C) (Oral)  Resp 18  Ht '5\' 7"'$  (1.702 m)  Wt 241 lb 3.2 oz (109.408 kg)  BMI 37.77 kg/m2  SpO2 96% Physical Exam  Constitutional: He is oriented to person, place, and time. He appears well-developed.  HENT:  Head: Normocephalic and atraumatic.  Eyes: Conjunctivae and EOM are normal. Pupils are equal, round, and reactive to light.  Neck: Normal range of motion. Neck supple.  Cardiovascular: Normal rate, regular rhythm and normal heart sounds.   Pulmonary/Chest: Effort normal and breath sounds normal. No respiratory distress. He has no wheezes.  Abdominal: Soft. Bowel sounds are normal. He exhibits no distension. There is no tenderness. There  is no rebound and no guarding.  Neurological: He is alert and oriented to person, place, and time.  Skin: Skin is warm.  Nursing note and vitals reviewed.   ED Course  Procedures (including critical care time) Labs Review Labs Reviewed  BASIC METABOLIC PANEL - Abnormal; Notable for the following:    Sodium 146 (*)    Glucose, Bld 106 (*)    GFR calc non Af Amer 56 (*)    All other components within normal limits  PROTIME-INR - Abnormal; Notable for the following:  Prothrombin Time 16.5 (*)    All other components within normal limits  CBC  BRAIN NATRIURETIC PEPTIDE  TROPONIN I  TROPONIN I  TROPONIN I  MAGNESIUM  I-STAT TROPOININ, ED  I-STAT TROPOININ, ED  Randolm Idol, ED    Imaging Review Dg Chest 2 View  03/17/2015  CLINICAL DATA:  Acute onset of mid chest pain, shortness of breath and dizziness. Atrial fibrillation. Initial encounter. EXAM: CHEST  2 VIEW COMPARISON:  Chest radiograph performed 01/25/2015 FINDINGS: The lungs are well-aerated. Mild bibasilar atelectasis or scarring is noted. There is no evidence of pleural effusion or pneumothorax. The heart is borderline normal in size. No acute osseous abnormalities are seen. IMPRESSION: Mild bibasilar atelectasis or scarring noted. Lungs otherwise clear. Electronically Signed   By: Garald Balding M.D.   On: 03/17/2015 04:37   I have personally reviewed and evaluated these images and lab results as part of my medical decision-making.   EKG Interpretation   Date/Time:  Saturday March 17 2015 04:12:25 EST Ventricular Rate:  109 PR Interval:    QRS Duration: 107 QT Interval:  340 QTC Calculation: 458 R Axis:   -21 Text Interpretation:  Atrial fibrillation Ventricular premature complex  Borderline left axis deviation Abnormal R-wave progression, early  transition Borderline T abnormalities, anterior leads No significant  change since last tracing Confirmed by Kathrynn Humble, MD, Starlit Raburn [phone]) on  03/17/2015 4:16:46 AM       MDM   Final diagnoses:  Chest pain    Pt comes in with cc of chest pain. Pt's chest pain is central, pressure like. Cardiac etiology is possible. Nitro gave no response. Patient has cardiac risk factors.   He also has a cardiac monitor on - and he was asked by the device company to come in. Not sure what to make of that.  Asked Cards to see the patient.    Varney Biles, MD 03/18/15 [phone]

## 2015-03-17 NOTE — H&P (Signed)
Patient ID: [name] [name] MRN: [id number], DOB/AGE: [birth year]   Admit date: 03/17/2015   Primary Physician: Simona Huh, MD Primary Cardiologist: Dr Tamala Julian  HPI: 75 y/o followed by Dr Tamala Julian. He had PAF in Nov 2016 and converted with Diltiazem to NSR. He is on Eliquis. He is wearing an event monitor (due to end today). Last night he woke up at 1am with chest pain. Shortly afterwards he was contacted by the monitor company and told he was in AF with RVR. The pt describes the pain as SSC "pressure" associated with Lt arm pain and dyspnea. He had a cath in 2013 that showed no significant CAD. In the ED he has converted to NSR spontaneously and is pain free now.    Problem List: Past Medical History  Diagnosis Date  . Hypertension   . Dysrhythmia     palpitations, followed by Dr.Ehinger, seen in prep for surgery on 03/27/2011   . Angina     chest pain- cardiac cath. followed in 2010, record available ,  told then that he should f/u /w Dr. Marisue Humble    . Asthma     uses  singulair  . Pneumonia     hosp. 77 yrs. ago  . Sleep apnea     study done, 10 yrs. ago, told that he needed  CPAP but  never used   . Hepatitis     jaundice- many yrs. ago  . Chronic kidney disease     prostate cancer - surg. removal- 1996  . Arthritis     R knee, back    . Hiatal hernia   . GERD (gastroesophageal reflux disease)   . Diverticulitis   . Colon polyps   . Cancer [facility])     prostate, melanoma- 2010, excision    . Prostate cancer ([place]) 1995  . Melanoma ([place])     Face    Past Surgical History  Procedure Laterality Date  . Cardiac catheterization      2010  . Tonsillectomy      as child  . Fracture surgery      L wrist, hardware- 1982  . Joint replacement      L knee, 2009  . Prostatectomy      for ca  . Total knee arthroplasty  04/22/2011    Procedure: TOTAL KNEE ARTHROPLASTY;  Surgeon: Garald Balding, MD;  Location: [place];  Service: Orthopedics;  Laterality: Right;  . Left  heart catheterization with coronary angiogram N/A 09/08/2011    Procedure: LEFT HEART CATHETERIZATION WITH CORONARY ANGIOGRAM;  Surgeon: Sinclair Grooms, MD;  Location: [facility] CATH LAB;  Service: Cardiovascular;  Laterality: N/A;     Allergies:  Allergies  Allergen Reactions  . Aminophylline Anaphylaxis  . Ampicillin Diarrhea and Nausea And Vomiting    Has patient had a PCN reaction causing immediate rash, facial/tongue/throat swelling, SOB or lightheadedness with hypotension: No Has patient had a PCN reaction causing severe rash involving mucus membranes or skin necrosis: No Has patient had a PCN reaction that required hospitalization No Has patient had a PCN reaction occurring within the last 10 years: No If all of the above answers are "NO", then may proceed with Cephalosporin use.   . Carvedilol     SOB/Fatigue  . Lisinopril Cough  . Tenormin [Atenolol] Other (See Comments)    fatigue  . Zolpidem Tartrate Other (See Comments)    hallucinations     Home Medications Prior to Admission medications  Medication Sig Start Date End Date Taking? Authorizing Provider  albuterol (PROVENTIL HFA;VENTOLIN HFA) 108 (90 BASE) MCG/ACT inhaler Inhale 2 puffs into the lungs every 6 (six) hours as needed for wheezing or shortness of breath.   Yes Historical Provider, MD  apixaban (ELIQUIS) 5 MG TABS tablet Take 1 tablet (5 mg total) by mouth 2 (two) times daily. 01/26/15  Yes Florencia Reasons, MD  budesonide-formoterol [facility]) 80-4.5 MCG/ACT inhaler Inhale 2 puffs into the lungs 2 (two) times daily. 03/14/15  Yes Juanito Doom, MD  Calcium Carbonate-Vitamin D (CALCIUM + D) 600-200 MG-UNIT TABS Take 1 tablet by mouth 2 (two) times daily.   Yes Historical Provider, MD  diltiazem (CARDIZEM CD) 120 MG 24 hr capsule Take 1 capsule (120 mg total) by mouth daily. 02/09/15  Yes Isaiah Serge, NP  fluticasone (FLONASE) 50 MCG/ACT nasal spray Place 2 sprays into the nose at bedtime as needed for allergies.    Yes  Historical Provider, MD  hydroxypropyl methylcellulose (ISOPTO TEARS) 2.5 % ophthalmic solution Place 1 drop into both eyes 3 (three) times daily as needed. For dry eyes   Yes Historical Provider, MD  montelukast (SINGULAIR) 10 MG tablet Take 10 mg by mouth daily before breakfast.    Yes Historical Provider, MD  Multiple Vitamins tablet Take 1 tablet by mouth daily.   Yes Historical Provider, MD  Omega-3 Fatty Acids (FISH OIL) 1000 MG CAPS Take 1,000 mg by mouth 2 (two) times daily.   Yes Historical Provider, MD  omeprazole (PRILOSEC) 40 MG capsule Take 40 mg by mouth at bedtime.    Yes Historical Provider, MD  Spacer/Aero-Holding Chambers (AEROCHAMBER MV) inhaler Use as instructed 02/26/15  Yes Juanito Doom, MD  telmisartan (MICARDIS) 80 MG tablet Take 0.5 tablets (40 mg total) by mouth daily. 01/26/15  Yes Florencia Reasons, MD     Family History  Problem Relation Age of Onset  . Anesthesia problems Neg Hx   . Hypotension Neg Hx   . Malignant hyperthermia Neg Hx   . Pseudochol deficiency Neg Hx   . Crohn's disease Brother   . Breast cancer Mother   . Alzheimer's disease Father   . Heart disease Brother   . Hypertension Brother      Social History   Social History  . Marital Status: Married    Spouse Name: N/A  . Number of Children: N/A  . Years of Education: N/A   Occupational History  . Not on file.   Social History Main Topics  . Smoking status: Former Smoker -- 1.50 packs/day for 30 years    Types: Cigarettes    Quit date: 04/11/1983  . Smokeless tobacco: Never Used  . Alcohol Use: No  . Drug Use: No  . Sexual Activity: No   Other Topics Concern  . Not on file   Social History Narrative     Review of Systems: General: negative for chills, fever, night sweats or weight changes.  Cardiovascular: negative for chest pain, edema, orthopnea, palpitations, paroxysmal nocturnal dyspnea or shortness of breath He has chronic DOE attributed to asthma HEENT: negative for any  visual disturbances, blindness, glaucoma Dermatological: negative for rash Respiratory: negative for cough, hemoptysis, or wheezing Asthma- sees Dr Pennie Banter Urologic: negative for hematuria or dysuria Abdominal: negative for nausea, vomiting, diarrhea, bright red blood per rectum, melena, or hematemesis Neurologic: negative for visual changes, syncope, or dizziness Musculoskeletal: negative for back pain, joint pain, or swelling Psych: cooperative and appropriate All other systems reviewed  and are otherwise negative except as noted above.  Physical Exam: Blood pressure 91/50, pulse 69, temperature 98.1 F (36.7 C), temperature source Oral, resp. rate 17, height '5\' 7"'$  (1.702 m), weight 243 lb (110.224 kg), SpO2 91 %.  General appearance: alert, cooperative, no distress and mildly obese Neck: no carotid bruit and no JVD Lungs: clear to auscultation bilaterally Heart: regular rate and rhythm Abdomen: soft, non-tender; bowel sounds normal; no masses,  no organomegaly Extremities: trace edema Pulses: 2+ and symmetric Skin: Skin color, texture, turgor normal. No rashes or lesions Neurologic: Grossly normal    Labs:   Results for orders placed or performed during the hospital encounter of 03/17/15 (from the past 24 hour(s))  Basic metabolic panel     Status: Abnormal   Collection Time: 03/17/15  4:11 AM  Result Value Ref Range   Sodium 146 (H) 135 - 145 mmol/L   Potassium 3.7 3.5 - 5.1 mmol/L   Chloride 109 101 - 111 mmol/L   CO2 27 22 - 32 mmol/L   Glucose, Bld 106 (H) 65 - 99 mg/dL   BUN 20 6 - 20 mg/dL   Creatinine, Ser 1.22 0.61 - 1.24 mg/dL   Calcium 9.8 8.9 - 10.3 mg/dL   GFR calc non Af Amer 56 (L) >60 mL/min   GFR calc Af Amer >60 >60 mL/min   Anion gap 10 5 - 15  CBC     Status: None   Collection Time: 03/17/15  4:11 AM  Result Value Ref Range   WBC 6.4 4.0 - 10.5 K/uL   RBC 4.57 4.22 - 5.81 MIL/uL   Hemoglobin 14.0 13.0 - 17.0 g/dL   HCT 40.7 39.0 - 52.0 %   MCV  89.1 78.0 - 100.0 fL   MCH 30.6 26.0 - 34.0 pg   MCHC 34.4 30.0 - 36.0 g/dL   RDW 13.2 11.5 - 15.5 %   Platelets 164 150 - 400 K/uL  Protime-INR - (order if Patient is taking Coumadin / Warfarin)     Status: Abnormal   Collection Time: 03/17/15  4:11 AM  Result Value Ref Range   Prothrombin Time 16.5 (H) 11.6 - 15.2 seconds   INR 1.32 0.00 - 1.49  I-stat troponin, ED (not at [facility])     Status: None   Collection Time: 03/17/15  4:16 AM  Result Value Ref Range   Troponin i, poc 0.00 0.00 - 0.08 ng/mL   Comment 3          Brain natriuretic peptide     Status: None   Collection Time: 03/17/15  4:43 AM  Result Value Ref Range   B Natriuretic Peptide 36.7 0.0 - 100.0 pg/mL  I-Stat Troponin, ED (not at [facility])     Status: None   Collection Time: 03/17/15  7:42 AM  Result Value Ref Range   Troponin i, poc 0.00 0.00 - 0.08 ng/mL   Comment 3             Radiology/Studies: Dg Chest 2 View  03/17/2015  CLINICAL DATA:  Acute onset of mid chest pain, shortness of breath and dizziness. Atrial fibrillation. Initial encounter. EXAM: CHEST  2 VIEW COMPARISON:  Chest radiograph performed 01/25/2015 FINDINGS: The lungs are well-aerated. Mild bibasilar atelectasis or scarring is noted. There is no evidence of pleural effusion or pneumothorax. The heart is borderline normal in size. No acute osseous abnormalities are seen. IMPRESSION: Mild bibasilar atelectasis or scarring noted. Lungs otherwise clear. Electronically Signed  By: Garald Balding M.D.   On: 03/17/2015 04:37    EKG: on adm AF with VR 109, occasional PVC  ASSESSMENT AND PLAN:  Principal Problem:   Chest pain at rest Active Problems:   PAF (paroxysmal atrial fibrillation) ([place])   Hypertension   Asthma   History of prostate cancer   Obesity, Class II, BMI 35-39.9, with comorbidity ([place])   Dyspnea   Normal coronary arteries 2013   Chronic anticoagulation   PLAN: Discussed with Dr Tamala Julian. Chest pain is typical for angina and atypical  for AF. No significant CAD in 2013 reassuring. Will admit, cycle Troponin, monitor, plan Lexiscan in am.    SignedErlene Quan, PA-C 03/17/2015, 8:31 AM [phone]

## 2015-03-17 NOTE — ED Notes (Signed)
Pt in EMS from home reporting central CP. Rates at 4/10, describes as pressure in nature. No radiation. Repots SOB, dizziness. Denies N/V. Pain started 0130, pt currently wearing holter monitor and received call from company stating he needed to call 911. Pt in afib. Received 1NTG

## 2015-03-18 ENCOUNTER — Inpatient Hospital Stay (HOSPITAL_COMMUNITY): Payer: Medicare Other

## 2015-03-18 DIAGNOSIS — R079 Chest pain, unspecified: Secondary | ICD-10-CM

## 2015-03-18 LAB — NM MYOCAR MULTI W/SPECT W/WALL MOTION / EF
Exercise duration (min): 4 min
Rest HR: 57 {beats}/min

## 2015-03-18 MED ORDER — REGADENOSON 0.4 MG/5ML IV SOLN
INTRAVENOUS | Status: AC
  Filled 2015-03-18: qty 5

## 2015-03-18 MED ORDER — NITROGLYCERIN 0.4 MG SL SUBL: 0.4000 mg | SUBLINGUAL_TABLET | SUBLINGUAL | Status: DC | PRN

## 2015-03-18 MED ORDER — FLECAINIDE ACETATE 50 MG PO TABS: 50.0000 mg | ORAL_TABLET | Freq: Two times a day (BID) | ORAL | Status: DC

## 2015-03-18 MED ORDER — ACETAMINOPHEN 325 MG PO TABS: 650.0000 mg | ORAL_TABLET | ORAL | Status: DC | PRN

## 2015-03-18 MED ORDER — TECHNETIUM TC 99M SESTAMIBI GENERIC - CARDIOLITE
10.0000 | Freq: Once | INTRAVENOUS | Status: AC | PRN
  Administered 2015-03-18: 10 via INTRAVENOUS

## 2015-03-18 MED ORDER — FLECAINIDE ACETATE 50 MG PO TABS
50.0000 mg | ORAL_TABLET | Freq: Two times a day (BID) | ORAL | Status: DC
  Filled 2015-03-18: qty 1

## 2015-03-18 MED ORDER — TECHNETIUM TC 99M SESTAMIBI - CARDIOLITE
30.0000 | Freq: Once | INTRAVENOUS | Status: AC | PRN
  Administered 2015-03-18: 30 via INTRAVENOUS

## 2015-03-18 MED ORDER — TECHNETIUM TC 99M SESTAMIBI - CARDIOLITE: 930.0000 | Freq: Once | INTRAVENOUS | Status: DC | PRN

## 2015-03-18 MED ORDER — REGADENOSON 0.4 MG/5ML IV SOLN
0.4000 mg | Freq: Once | INTRAVENOUS | Status: AC
  Administered 2015-03-18: 0.4 mg via INTRAVENOUS

## 2015-03-18 NOTE — Discharge Summary (Signed)
Patient ID: [name] [name],  MRN: [id number], DOB/AGE: March 27, 1940 75 y.o.  Admit date: 03/17/2015 Discharge date: 03/18/2015  Primary Care Provider: Simona Huh, MD Primary Cardiologist: Dr Tamala Julian  Discharge Diagnoses Principal Problem:   Chest pain at rest Active Problems:   PAF (paroxysmal atrial fibrillation) ([place])   Hypertension   Asthma   History of prostate cancer   Obesity, Class II, BMI 35-39.9, with comorbidity ([place])   Dyspnea   Normal coronary arteries 2013   Chronic anticoagulation   Atrial fibrillation with RVR [facility])    Procedures: Lexiscan Myoview 03/18/15   Hospital Course:  75 y/o followed by Dr Tamala Julian. He had PAF in Nov 2016 and converted with Diltiazem to NSR. He is on Eliquis. He is wearing an event monitor (due to end today). Last night he woke up at 1am with chest pain. Shortly afterwards he was contacted by the monitor company and told he was in AF with RVR. The pt describes the pain as SSC "pressure" associated with Lt arm pain and dyspnea. He had a cath in 2013 that showed no significant CAD. In the ED he has converted to NSR spontaneously. He was admitted to telemetry and monitored. He remained in NSR. Myoview done 03/18/15 was negative for ischemia, EF 55%. His B/P was borderline and we felt we could not increase his Diltiazem further. Dr Radford Pax suggested adding Flecainide 50 mg BID if his Myoview was low risk since this was the second episode of PAF in 2 months.   Discharge Vitals:  Blood pressure 147/68, pulse 80, temperature 97.7 F (36.5 C), temperature source Oral, resp. rate 20, height '5\' 7"'$  (1.702 m), weight 241 lb 4.8 oz (109.453 kg), SpO2 97 %.    Labs: Results for orders placed or performed during the hospital encounter of 03/17/15 (from the past 24 hour(s))  Troponin I     Status: None   Collection Time: 03/17/15  8:27 PM  Result Value Ref Range   Troponin I <0.03 <0.031 ng/mL    Disposition:  Follow-up Information    Follow up with  Sinclair Grooms, MD.   Specialty:  Cardiology   Why:  office will contact you   Contact information:   [address] [address] [phone]       Discharge Medications:    Medication List    TAKE these medications        acetaminophen 325 MG tablet  Commonly known as:  TYLENOL  Take 2 tablets (650 mg total) by mouth every 4 (four) hours as needed for headache or mild pain.     AEROCHAMBER MV inhaler  Use as instructed     albuterol 108 (90 Base) MCG/ACT inhaler  Commonly known as:  PROVENTIL HFA;VENTOLIN HFA  Inhale 2 puffs into the lungs every 6 (six) hours as needed for wheezing or shortness of breath.     apixaban 5 MG Tabs tablet  Commonly known as:  ELIQUIS  Take 1 tablet (5 mg total) by mouth 2 (two) times daily.     budesonide-formoterol 80-4.5 MCG/ACT inhaler  Commonly known as:  SYMBICORT  Inhale 2 puffs into the lungs 2 (two) times daily.     Calcium Carbonate-Vitamin D 600-200 MG-UNIT Tabs  Take 1 tablet by mouth 2 (two) times daily.     diltiazem 120 MG 24 hr capsule  Commonly known as:  CARDIZEM CD  Take 1 capsule (120 mg total) by mouth daily.  Fish Oil 1000 MG Caps  Take 1,000 mg by mouth 2 (two) times daily.     flecainide 50 MG tablet  Commonly known as:  TAMBOCOR  Take 1 tablet (50 mg total) by mouth every 12 (twelve) hours.     fluticasone 50 MCG/ACT nasal spray  Commonly known as:  FLONASE  Place 2 sprays into the nose at bedtime as needed for allergies.     hydroxypropyl methylcellulose / hypromellose 2.5 % ophthalmic solution  Commonly known as:  ISOPTO TEARS / GONIOVISC  Place 1 drop into both eyes 3 (three) times daily as needed. For dry eyes     montelukast 10 MG tablet  Commonly known as:  SINGULAIR  Take 10 mg by mouth daily before breakfast.     Multiple Vitamins tablet  Take 1 tablet by mouth daily.     nitroGLYCERIN 0.4 MG SL tablet  Commonly known as:  NITROSTAT  Place 1 tablet (0.4 mg  total) under the tongue every 5 (five) minutes x 3 doses as needed for chest pain.     omeprazole 40 MG capsule  Commonly known as:  PRILOSEC  Take 40 mg by mouth at bedtime.     telmisartan 80 MG tablet  Commonly known as:  MICARDIS  Take 0.5 tablets (40 mg total) by mouth daily.         Duration of Discharge Encounter: Greater than 30 minutes including physician time.  Angelena Form PA-C 03/18/2015 4:50 PM

## 2015-03-18 NOTE — Progress Notes (Signed)
SUBJECTIVE:  No complaints  OBJECTIVE:   Vitals:   Filed Vitals:   03/17/15 0915 03/17/15 1014 03/17/15 2132 03/18/15 0500  BP: 91/50 108/47 124/54 117/47  Pulse: 62 63  58  Temp:  97.9 F (36.6 C) 97.8 F (36.6 C) 97.7 F (36.5 C)  TempSrc:  Oral Oral   Resp:   18 20  Height:  '5\' 7"'$  (1.702 m)    Weight:  241 lb 3.2 oz (109.408 kg)  241 lb 4.8 oz (109.453 kg)  SpO2: 93% 97% 96% 97%   I&O's:   Intake/Output Summary (Last 24 hours) at 03/18/15 0845 Last data filed at 03/18/15 0500  Gross per 24 hour  Intake    840 ml  Output   1300 ml  Net   -460 ml   TELEMETRY: Reviewed telemetry pt in NSR:     PHYSICAL EXAM General: Well developed, well nourished, in no acute distress Head: Eyes PERRLA, No xanthomas.   Normal cephalic and atramatic  Lungs:   Clear bilaterally to auscultation and percussion. Heart:   HRRR S1 S2 Pulses are 2+ & equal. Abdomen: Bowel sounds are positive, abdomen soft and non-tender without masses Extremities:   No clubbing, cyanosis or edema.  DP +1 Neuro: Alert and oriented X 3. Psych:  Good affect, responds appropriately   LABS: Basic Metabolic Panel:  Recent Labs  03/17/15 0411 03/17/15 0854  NA 146*  --   K 3.7  --   CL 109  --   CO2 27  --   GLUCOSE 106*  --   BUN 20  --   CREATININE 1.22  --   CALCIUM 9.8  --   MG  --  2.0   Liver Function Tests: No results for input(s): AST, ALT, ALKPHOS, BILITOT, PROT, ALBUMIN in the last 72 hours. No results for input(s): LIPASE, AMYLASE in the last 72 hours. CBC:  Recent Labs  03/17/15 0411  WBC 6.4  HGB 14.0  HCT 40.7  MCV 89.1  PLT 164   Cardiac Enzymes:  Recent Labs  03/17/15 0854 03/17/15 1443 03/17/15 2027  TROPONINI <0.03 <0.03 <0.03   BNP: Invalid input(s): POCBNP D-Dimer: No results for input(s): DDIMER in the last 72 hours. Hemoglobin A1C: No results for input(s): HGBA1C in the last 72 hours. Fasting Lipid Panel: No results for input(s): CHOL, HDL, LDLCALC,  TRIG, CHOLHDL, LDLDIRECT in the last 72 hours. Thyroid Function Tests: No results for input(s): TSH, T4TOTAL, T3FREE, THYROIDAB in the last 72 hours.  Invalid input(s): FREET3 Anemia Panel: No results for input(s): VITAMINB12, FOLATE, FERRITIN, TIBC, IRON, RETICCTPCT in the last 72 hours. Coag Panel:   Lab Results  Component Value Date   INR 1.32 03/17/2015   INR 1.23 01/26/2015   INR 1.11 09/08/2011    RADIOLOGY: Dg Chest 2 View  03/17/2015  CLINICAL DATA:  Acute onset of mid chest pain, shortness of breath and dizziness. Atrial fibrillation. Initial encounter. EXAM: CHEST  2 VIEW COMPARISON:  Chest radiograph performed 01/25/2015 FINDINGS: The lungs are well-aerated. Mild bibasilar atelectasis or scarring is noted. There is no evidence of pleural effusion or pneumothorax. The heart is borderline normal in size. No acute osseous abnormalities are seen. IMPRESSION: Mild bibasilar atelectasis or scarring noted. Lungs otherwise clear. Electronically Signed   By: Garald Balding M.D.   On: 03/17/2015 04:37    ASSESSMENT AND PLAN:  Principal Problem:  Chest pain at rest Active Problems:  PAF (paroxysmal atrial fibrillation) (HCC)  Hypertension  Asthma  History of prostate cancer  Obesity, Class II, BMI 35-39.9, with comorbidity (HCC)  Dyspnea  Normal coronary arteries 2013  Chronic anticoagulation  1.  Atypical chest pain with history of normal coronary arteries by cath 2013.  Cardiac enzymes negative.  On for nuclear stress test today. 2.  Paroxysmal atrial fibrillation presenting with afib with RVR in ER and spontaneously converted to NSR.  Maintaining NSR on Cardizem.  Continue Eliquis for CHADS2VASC score of 4.  Cannot increase Cardizem further due to soft BP and bradycardia.  If nuclear stress test normal will add Flecainide for suppression of afib since this is second episode of PAF in 2 months and cannot titrated CCB further. 3.  HTN - controlled.     Sueanne Margarita,  MD  03/18/2015  8:45 AM

## 2015-03-18 NOTE — Discharge Instructions (Signed)
Atrial Fibrillation Atrial fibrillation is a type of heartbeat that is irregular or fast (rapid). If you have this condition, your heart keeps quivering in a weird (chaotic) way. This condition can make it so your heart cannot pump blood normally. Having this condition gives a person more risk for stroke, heart failure, and other heart problems. There are different types of atrial fibrillation. Talk with your doctor to learn about the type that you have. HOME CARE  Take over-the-counter and prescription medicines only as told by your doctor.  If your doctor prescribed a blood-thinning medicine, take it exactly as told. Taking too much of it can cause bleeding. If you do not take enough of it, you will not have the protection that you need against stroke and other problems.  Do not use any tobacco products. These include cigarettes, chewing tobacco, and e-cigarettes. If you need help quitting, ask your doctor.  If you have apnea (obstructive sleep apnea), manage it as told by your doctor.  Do not drink alcohol.  Do not drink beverages that have caffeine. These include coffee, soda, and tea.  Maintain a healthy weight. Do not use diet pills unless your doctor says they are safe for you. Diet pills may make heart problems worse.  Follow diet instructions as told by your doctor.  Exercise regularly as told by your doctor.  Keep all follow-up visits as told by your doctor. This is important. GET HELP IF:  You notice a change in the speed, rhythm, or strength of your heartbeat.  You are taking a blood-thinning medicine and you notice more bruising.  You get tired more easily when you move or exercise. GET HELP RIGHT AWAY IF:  You have pain in your chest or your belly (abdomen).  You have sweating or weakness.  You feel sick to your stomach (nauseous).  You notice blood in your throw up (vomit), poop (stool), or pee (urine).  You are short of breath.  You suddenly have swollen feet  and ankles.  You feel dizzy.  Your suddenly get weak or numb in your face, arms, or legs, especially if it happens on one side of your body.  You have trouble talking, trouble understanding, or both.  Your face or your eyelid droops on one side. These symptoms may be an emergency. Do not wait to see if the symptoms will go away. Get medical help right away. Call your local emergency services (911 in the [place] Do not drive yourself to the hospital.   This information is not intended to replace advice given to you by your health care provider. Make sure you discuss any questions you have with your health care provider.   Document Released: 12/04/2007 Document Revised: 11/15/2014 Document Reviewed: 06/21/2014 Elsevier Interactive Patient Education 2016 Elsevier Inc. Chest Wall Pain Chest wall pain is pain in or around the bones and muscles of your chest. Sometimes, an injury causes this pain. Sometimes, the cause may not be known. This pain may take several weeks or longer to get better. HOME CARE Pay attention to any changes in your symptoms. Take these actions to help with your pain:  Rest as told by your doctor.  Avoid activities that cause pain. Try not to use your chest, belly (abdominal), or side muscles to lift heavy things.  If directed, apply ice to the painful area:  Put ice in a plastic bag.  Place a towel between your skin and the bag.  Leave the ice on for 20 minutes, 2-3  times per day.  Take over-the-counter and prescription medicines only as told by your doctor.  Do not use tobacco products, including cigarettes, chewing tobacco, and e-cigarettes. If you need help quitting, ask your doctor.  Keep all follow-up visits as told by your doctor. This is important. GET HELP IF:  You have a fever.  Your chest pain gets worse.  You have new symptoms. GET HELP RIGHT AWAY IF:  You feel sick to your stomach (nauseous) or you throw up (vomit).  You feel sweaty or  light-headed.  You have a cough with phlegm (sputum) or you cough up blood.  You are short of breath.   This information is not intended to replace advice given to you by your health care provider. Make sure you discuss any questions you have with your health care provider.   Document Released: 08/13/2007 Document Revised: 11/15/2014 Document Reviewed: 05/22/2014 Elsevier Interactive Patient Education 2016 [place] on my medicine - ELIQUIS (apixaban)  This medication education was reviewed with me or my healthcare representative as part of my discharge preparation.  The pharmacist that spoke with me during my hospital stay was:  Pat Patrick, [facility]  Why was Eliquis prescribed for you? Eliquis was prescribed for you to reduce the risk of a blood clot forming that can cause a stroke if you have a medical condition called atrial fibrillation (a type of irregular heartbeat).  What do You need to know about Eliquis ? Take your Eliquis TWICE DAILY - one tablet in the morning and one tablet in the evening with or without food. If you have difficulty swallowing the tablet whole please discuss with your pharmacist how to take the medication safely.  Take Eliquis exactly as prescribed by your doctor and DO NOT stop taking Eliquis without talking to the doctor who prescribed the medication.  Stopping may increase your risk of developing a stroke.  Refill your prescription before you run out.  After discharge, you should have regular check-up appointments with your healthcare provider that is prescribing your Eliquis.  In the future your dose may need to be changed if your kidney function or weight changes by a significant amount or as you get older.  What do you do if you miss a dose? If you miss a dose, take it as soon as you remember on the same day and resume taking twice daily.  Do not take more than one dose of ELIQUIS at the same time to make up a missed  dose.  Important Safety Information A possible side effect of Eliquis is bleeding. You should call your healthcare provider right away if you experience any of the following: ? Bleeding from an injury or your nose that does not stop. ? Unusual colored urine (red or dark brown) or unusual colored stools (red or black). ? Unusual bruising for unknown reasons. ? A serious fall or if you hit your head (even if there is no bleeding).  Some medicines may interact with Eliquis and might increase your risk of bleeding or clotting while on Eliquis. To help avoid this, consult your healthcare provider or pharmacist prior to using any new prescription or non-prescription medications, including herbals, vitamins, non-steroidal anti-inflammatory drugs (NSAIDs) and supplements.  This website has more information on Eliquis (apixaban): http://www.eliquis.com/eliquis/home

## 2015-03-19 ENCOUNTER — Telehealth: Payer: Self-pay | Admitting: *Deleted

## 2015-03-19 NOTE — Telephone Encounter (Signed)
Received fax of report from monitor company "physician notification" Pt is currently inpatient at [facility]. Had been called and instructed to dial 911 and go to ED. Reports left with Dr. Thompson Caul primary CMA to review with him.

## 2015-03-28 ENCOUNTER — Ambulatory Visit (INDEPENDENT_AMBULATORY_CARE_PROVIDER_SITE_OTHER): Payer: Medicare Other | Admitting: Cardiology

## 2015-03-28 ENCOUNTER — Encounter: Payer: Self-pay | Admitting: Cardiology

## 2015-03-28 VITALS — BP 120/70 | HR 61 | Ht 67.0 in

## 2015-03-28 DIAGNOSIS — Z7901 Long term (current) use of anticoagulants: Secondary | ICD-10-CM | POA: Diagnosis not present

## 2015-03-28 DIAGNOSIS — IMO0001 Reserved for inherently not codable concepts without codable children: Secondary | ICD-10-CM

## 2015-03-28 DIAGNOSIS — I480 Paroxysmal atrial fibrillation: Secondary | ICD-10-CM | POA: Diagnosis not present

## 2015-03-28 DIAGNOSIS — Z0389 Encounter for observation for other suspected diseases and conditions ruled out: Secondary | ICD-10-CM | POA: Diagnosis not present

## 2015-03-28 DIAGNOSIS — I10 Essential (primary) hypertension: Secondary | ICD-10-CM

## 2015-03-28 NOTE — Progress Notes (Signed)
03/28/2015 [name] [name]   [birth year]  [id number]  Primary Physician Simona Huh, MD Primary Cardiologist: Dr Tamala Julian  HPI:  75 y/o male followed by Dr Tamala Julian with a history of PAF. He has chest pain when he is in AF. He was recently seen in the ED at [facility] with chest pain and AF. He converted spontaneously to NSR in the ED with resolution of his symptoms. He was admitted overnight and ruled out for an MI. Myoview done 03/18/15 was low risk. Flecainide 50 mg BID was added. He is seen in the office today for follow up. He has done well since discharge. He denies any further chest pain or tachycardia.    Current Outpatient Prescriptions  Medication Sig Dispense Refill  . acetaminophen (TYLENOL) 325 MG tablet Take 2 tablets (650 mg total) by mouth every 4 (four) hours as needed for headache or mild pain.    Marland Kitchen albuterol (PROVENTIL HFA;VENTOLIN HFA) 108 (90 BASE) MCG/ACT inhaler Inhale 2 puffs into the lungs every 6 (six) hours as needed for wheezing or shortness of breath.    Marland Kitchen apixaban (ELIQUIS) 5 MG TABS tablet Take 1 tablet (5 mg total) by mouth 2 (two) times daily. 60 tablet 9  . budesonide-formoterol (SYMBICORT) 80-4.5 MCG/ACT inhaler Inhale 2 puffs into the lungs 2 (two) times daily. 1 Inhaler 5  . Calcium Carbonate-Vitamin D (CALCIUM + D) 600-200 MG-UNIT TABS Take 1 tablet by mouth 2 (two) times daily.    Marland Kitchen diltiazem (CARDIZEM CD) 120 MG 24 hr capsule Take 1 capsule (120 mg total) by mouth daily. 90 capsule 3  . flecainide (TAMBOCOR) 50 MG tablet Take 1 tablet (50 mg total) by mouth every 12 (twelve) hours. 60 tablet 11  . fluticasone (FLONASE) 50 MCG/ACT nasal spray Place 2 sprays into the nose at bedtime as needed for allergies.     . hydroxypropyl methylcellulose (ISOPTO TEARS) 2.5 % ophthalmic solution Place 1 drop into both eyes 3 (three) times daily as needed. For dry eyes    . montelukast (SINGULAIR) 10 MG tablet Take 10 mg by mouth daily before breakfast.     . Multiple  Vitamins tablet Take 1 tablet by mouth daily.    . nitroGLYCERIN (NITROSTAT) 0.4 MG SL tablet Place 1 tablet (0.4 mg total) under the tongue every 5 (five) minutes x 3 doses as needed for chest pain. 25 tablet 3  . Omega-3 Fatty Acids (FISH OIL) 1000 MG CAPS Take 1,000 mg by mouth 2 (two) times daily.    Marland Kitchen omeprazole (PRILOSEC) 40 MG capsule Take 40 mg by mouth at bedtime.     Marland Kitchen Spacer/Aero-Holding Chambers (AEROCHAMBER MV) inhaler Use as instructed 1 each 0  . telmisartan (MICARDIS) 80 MG tablet Take 0.5 tablets (40 mg total) by mouth daily. 30 tablet 0   No current facility-administered medications for this visit.    Allergies  Allergen Reactions  . Carvedilol     SOB/Fatigue  . Lisinopril Cough  . Tenormin [Atenolol] Other (See Comments)    Fatigue, low HR  . Zolpidem Tartrate Other (See Comments)    hallucinations    Social History   Social History  . Marital Status: Married    Spouse Name: N/A  . Number of Children: N/A  . Years of Education: N/A   Occupational History  . Not on file.   Social History Main Topics  . Smoking status: Former Smoker -- 1.50 packs/day for 30 years    Types: Cigarettes  Quit date: 04/11/1983  . Smokeless tobacco: Never Used  . Alcohol Use: No  . Drug Use: No  . Sexual Activity: No   Other Topics Concern  . Not on file   Social History Narrative     Review of Systems: General: negative for chills, fever, night sweats or weight changes.  Cardiovascular: negative for chest pain, dyspnea on exertion, edema, orthopnea, palpitations, paroxysmal nocturnal dyspnea or shortness of breath Dermatological: negative for rash Respiratory: negative for cough or wheezing Urologic: negative for hematuria Abdominal: negative for nausea, vomiting, diarrhea, bright red blood per rectum, melena, or hematemesis Neurologic: negative for visual changes, syncope, or dizziness All other systems reviewed and are otherwise negative except as noted  above.    Height '5\' 7"'$  (1.702 m).  General appearance: alert, cooperative, no distress and moderately obese Neck: no carotid bruit and no JVD Lungs: clear to auscultation bilaterally Heart: regular rate and rhythm Extremities: trace edema Skin: Skin color, texture, turgor normal. No rashes or lesions Neurologic: Grossly normal  EKG NSR, incomplete RBBB, QTc 420  ASSESSMENT AND PLAN:   PAF (paroxysmal atrial fibrillation) ([place]) Pt seen in the ED 03/17/15 with PAF associated with chest pain. Flecainide added  Normal coronary arteries 2013 No significant CAD 2013, low risk Myoview Jan 2017 with normal LVF  Chronic anticoagulation Eliquis  Obesity, Class II, BMI 35-39.9, with comorbidity (HCC) Pt is scheduled to have a sleep study  Hypertension B/P under good control   PLAN  Same Rx. They wanted to know about going to [place] for a visit and assured them this would be OK. I suggested he call next time he has an episode instead of going to the ED as we may be able to give him an extra Diltiazem over the phone to covert him. He sleeps in a recliner and is obese. He has a sleep study scheduled an I encouraged him to keep this apt. We discussed the importance limiting caffeine and avoiding OTC decongestants. He has a f/u already scheduled with dr Tamala Julian in March.   [name] PA-C 03/28/2015 2:05 PM

## 2015-03-28 NOTE — Patient Instructions (Signed)
Medication Instructions:  Your physician recommends that you continue on your current medications as directed. Please refer to the Current Medication list given to you today.    Labwork: None ordered  Testing/Procedures: None ordered   Follow-Up: Follow up as planned with Dr.Smith in March 2017   Any Other Special Instructions Will Be Listed Below (If Applicable).     If you need a refill on your cardiac medications before your next appointment, please call your pharmacy.

## 2015-03-28 NOTE — Assessment & Plan Note (Signed)
No significant CAD 2013, low risk Myoview Jan 2017 with normal LVF

## 2015-03-28 NOTE — Assessment & Plan Note (Signed)
Eliquis 

## 2015-03-28 NOTE — Assessment & Plan Note (Signed)
Pt seen in the ED 03/17/15 with PAF associated with chest pain. Flecainide added

## 2015-03-28 NOTE — Assessment & Plan Note (Signed)
B/P under good control

## 2015-03-28 NOTE — Assessment & Plan Note (Signed)
Pt is scheduled to have a sleep study

## 2015-04-08 ENCOUNTER — Ambulatory Visit (HOSPITAL_BASED_OUTPATIENT_CLINIC_OR_DEPARTMENT_OTHER): Payer: Medicare Other | Attending: Cardiology

## 2015-04-08 VITALS — Ht 67.0 in | Wt 245.0 lb

## 2015-04-08 DIAGNOSIS — Z7901 Long term (current) use of anticoagulants: Secondary | ICD-10-CM | POA: Diagnosis not present

## 2015-04-08 DIAGNOSIS — R0683 Snoring: Secondary | ICD-10-CM | POA: Diagnosis not present

## 2015-04-08 DIAGNOSIS — G4733 Obstructive sleep apnea (adult) (pediatric): Secondary | ICD-10-CM | POA: Diagnosis not present

## 2015-04-08 DIAGNOSIS — R002 Palpitations: Secondary | ICD-10-CM | POA: Insufficient documentation

## 2015-04-08 DIAGNOSIS — Z79899 Other long term (current) drug therapy: Secondary | ICD-10-CM | POA: Diagnosis not present

## 2015-04-11 ENCOUNTER — Telehealth: Payer: Self-pay | Admitting: Cardiology

## 2015-04-11 ENCOUNTER — Encounter (HOSPITAL_BASED_OUTPATIENT_CLINIC_OR_DEPARTMENT_OTHER): Payer: Self-pay

## 2015-04-11 DIAGNOSIS — G4733 Obstructive sleep apnea (adult) (pediatric): Secondary | ICD-10-CM

## 2015-04-11 HISTORY — DX: Obstructive sleep apnea (adult) (pediatric): G47.33

## 2015-04-11 NOTE — Telephone Encounter (Signed)
Please let patient know that they have sleep apnea and recommend CPAP titration. Please set up titration in the sleep lab. 

## 2015-04-11 NOTE — Sleep Study (Signed)
   Patient Name: [name] [name], [name] [name] MRN: [id number] Study Date: 04/08/2015 Gender: Male D.O.B: November 18, 1940 Age (years): 72 Referring Provider: Fransico Him MD, ABSM Interpreting Physician: Fransico Him MD, ABSM RPSGT: Joni Reining  BMI: 38 Height (inches): 67 Weight (lbs): 245 Neck Size: 16.00  CLINICAL INFORMATION Sleep Study Type: NPSG Indication for sleep study: N/A Epworth Sleepiness Score: 11  SLEEP STUDY TECHNIQUE As per the AASM Manual for the Scoring of Sleep and Associated Events v2.3 (April 2016) with a hypopnea requiring 4% desaturations. The channels recorded and monitored were frontal, central and occipital EEG, electrooculogram (EOG), submentalis EMG (chin), nasal and oral airflow, thoracic and abdominal wall motion, anterior tibialis EMG, snore microphone, electrocardiogram, and pulse oximetry.  MEDICATIONS Patient's medications include: Albuterol, Eliquis, Symbicort, Calcium and VIt D, Cardizem, Flecinide, Singulair, Flonase, Prilosec, Micardis. Medications self-administered by patient during sleep study : No sleep medicine administered.  SLEEP ARCHITECTURE The study was initiated at 11:08:21 PM and ended at 5:11:13 AM. Sleep onset time was 14.2 minutes and the sleep efficiency was reduced at 77.3%. The total sleep time was 280.7 minutes. Stage REM latency was 113.5 minutes. The patient spent 4.45% of the night in stage N1 sleep, 73.46% in stage N2 sleep, 0.00% in stage N3 and 22.09% in REM. Alpha intrusion was absent. Supine sleep was 100.00%.  RESPIRATORY PARAMETERS The overall apnea/hypopnea index (AHI) was 13.3 per hour. There were 3 total apneas, including 2 obstructive, 1 central and 0 mixed apneas. There were 59 hypopneas and 32 RERAs. The AHI during Stage REM sleep was 36.8 per hour. AHI while supine was 13.3 per hour. The mean oxygen saturation was 92.15%. The minimum SpO2 during sleep was 86.00%. Moderate snoring was noted during this  study.  CARDIAC DATA The 2 lead EKG demonstrated sinus rhythm. The mean heart rate was 58.53 beats per minute. Other EKG findings include: None.  LEG MOVEMENT DATA The total PLMS were 0 with a resulting PLMS index of 0.00. Associated arousal with leg movement index was 0.0 .  IMPRESSIONS - Mild to moderate obstructive sleep apnea occurred during this study (AHI = 13.3/h). - No significant central sleep apnea occurred during this study (CAI = 0.2/h). - Mild oxygen desaturation was noted during this study (Min O2 = 86.00%).  The total time spent with oxygen saturations < 88% was 3.4 minutes. - The patient snored with Moderate snoring volume. - No cardiac abnormalities were noted during this study. - Clinically significant periodic limb movements did not occur during sleep. No significant associated arousals.  DIAGNOSIS - Obstructive Sleep Apnea (327.23 [G47.33 ICD-10])  RECOMMENDATIONS - Therapeutic CPAP titration to determine optimal pressure required to alleviate sleep disordered breathing. - Positional therapy avoiding supine position during sleep. - Avoid alcohol, sedatives and other CNS depressants that may worsen sleep apnea and disrupt normal sleep architecture. - Sleep hygiene should be reviewed to assess factors that may improve sleep quality. - Weight management and regular exercise should be initiated or continued if appropriate.   [place], American Board of Sleep Medicine  ELECTRONICALLY SIGNED ON:  04/11/2015, 10:50 AM [place] PH: [phone]   FX: [phone] [place]

## 2015-04-17 NOTE — Telephone Encounter (Signed)
Patient has been informed of information. Stated verbal understanding.  He said that he is not sure is is ready for treatment at this time. He asked that I call him next week and he will make a decision about what he wants to do.   I gave him the benefits of using the machine, as well as the negative side effects of NOT treating OSA.  He stated that he would think about it and let me know when I call him next week.

## 2015-04-23 NOTE — Telephone Encounter (Signed)
Called patient to discuss CPAP Therapy.  He stated that he is not willing to do this right now.  He said that he has appointment with other physicians coming up and he will talk to them about it, but that he does not want to do therapy at this time.

## 2015-05-08 ENCOUNTER — Other Ambulatory Visit: Payer: Self-pay | Admitting: Interventional Cardiology

## 2015-05-08 DIAGNOSIS — I4891 Unspecified atrial fibrillation: Secondary | ICD-10-CM

## 2015-05-08 DIAGNOSIS — I480 Paroxysmal atrial fibrillation: Secondary | ICD-10-CM

## 2015-05-08 MED ORDER — DILTIAZEM HCL ER COATED BEADS 120 MG PO CP24: 120.0000 mg | ORAL_CAPSULE | Freq: Every day | ORAL | Status: DC

## 2015-05-08 MED ORDER — FLECAINIDE ACETATE 50 MG PO TABS: 50.0000 mg | ORAL_TABLET | Freq: Two times a day (BID) | ORAL | Status: DC

## 2015-05-08 MED ORDER — APIXABAN 5 MG PO TABS: 5.0000 mg | ORAL_TABLET | Freq: Two times a day (BID) | ORAL | Status: DC

## 2015-05-10 ENCOUNTER — Other Ambulatory Visit: Payer: Self-pay | Admitting: Pharmacist Clinician (PhC)/ Clinical Pharmacy Specialist

## 2015-05-10 MED ORDER — APIXABAN 5 MG PO TABS: 5.0000 mg | ORAL_TABLET | Freq: Two times a day (BID) | ORAL | Status: DC

## 2015-05-11 ENCOUNTER — Other Ambulatory Visit: Payer: Self-pay

## 2015-05-11 MED ORDER — BUDESONIDE-FORMOTEROL FUMARATE 80-4.5 MCG/ACT IN AERO: 2.0000 | INHALATION_SPRAY | Freq: Two times a day (BID) | RESPIRATORY_TRACT | Status: DC

## 2015-05-15 ENCOUNTER — Other Ambulatory Visit: Payer: Self-pay | Admitting: Interventional Cardiology

## 2015-05-18 ENCOUNTER — Encounter: Payer: Self-pay | Admitting: Pulmonary Disease

## 2015-05-18 ENCOUNTER — Encounter (INDEPENDENT_AMBULATORY_CARE_PROVIDER_SITE_OTHER): Payer: Self-pay

## 2015-05-18 ENCOUNTER — Ambulatory Visit (INDEPENDENT_AMBULATORY_CARE_PROVIDER_SITE_OTHER): Payer: Medicare Other | Admitting: Pulmonary Disease

## 2015-05-18 VITALS — BP 132/68 | HR 58 | Ht 67.0 in | Wt 251.0 lb

## 2015-05-18 DIAGNOSIS — R911 Solitary pulmonary nodule: Secondary | ICD-10-CM | POA: Insufficient documentation

## 2015-05-18 DIAGNOSIS — J441 Chronic obstructive pulmonary disease with (acute) exacerbation: Secondary | ICD-10-CM | POA: Diagnosis not present

## 2015-05-18 DIAGNOSIS — J45901 Unspecified asthma with (acute) exacerbation: Secondary | ICD-10-CM | POA: Diagnosis not present

## 2015-05-18 NOTE — Assessment & Plan Note (Signed)
He has chronic obstructive asthma which has been well controlled with the addition of Symbicort. I'm hopeful that with ongoing well-controlled symptoms we may be able to cut back on his dosing to just an inhaled corticosteroid on the next visit.  Plan: Continue Symbicort Continue Singulair Follow-up 3-4 months

## 2015-05-18 NOTE — Patient Instructions (Signed)
Keep taking Symbicort 2 puffs twice a day as you're doing Be sure to rinse her mouth out after using the Symbicort I will see you back in 3-4 months or sooner if needed

## 2015-05-18 NOTE — Progress Notes (Signed)
Subjective:    Patient ID: [name] [name], male    DOB: 1940-04-05, 75 y.o.   MRN: [id number]  Synopsis: Referred in 2016 for dyspnea, labeled.  As COPD but he likely has asthma based on lack of obstruction post bronchodilator.  November 2016 echocardiogram LVEF 60-65%, moderate to severe left atrial enlargement, normal RV size and function November 2016 chest x-ray mild atelectasis left base, likely chronic otherwise normal, cardiomegaly noted May 2015 PFT> pre-albuterol ratio 65%, FEV1 2.26 L (83% predicted), post albuterol FEV1 2.65 L (17% predicted, 400 mL change), total lung capacity 6.40 L (99% predicted) disease, DLCO 28.25 (99% pred)  Childhood polio, was in an iron lung  HPI Chief Complaint  Patient presents with  . Follow-up    pt c/o prod cough with clear mucus after taking symbicort.  denies chest pain, sinus congestion, SOB.    He started taking Symbicort and noticed an improvemetn in his dyspnea after about 2 weeks.  No bronchitis since starting the Sybmciort.  He has been told that he has a pulmonary nodules by his cancer doctors at [facility].  He had a f/u CXR which was clear, but he is scheduled for a f/u CT next month.     Past Medical History  Diagnosis Date  . Hypertension   . Dysrhythmia     palpitations, followed by Dr.Ehinger, seen in prep for surgery on 03/27/2011   . Angina     chest pain- cardiac cath. followed in 2010, record available ,  told then that he should f/u /w Dr. Marisue Humble    . Asthma     uses  singulair  . Pneumonia     hosp. 20 yrs. ago  . Sleep apnea     study done, 10 yrs. ago, told that he needed  CPAP but  never used   . Hepatitis     jaundice- many yrs. ago  . Chronic kidney disease     prostate cancer - surg. removal- 1996  . Arthritis     R knee, back    . Hiatal hernia   . GERD (gastroesophageal reflux disease)   . Diverticulitis   . Colon polyps   . Cancer [facility])     prostate, melanoma- 2010, excision    . Prostate cancer  ([place]) 1995  . 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 OSA (obstructive sleep apnea) 04/11/2015    Mild to moderate OSA with AHI 13.3/hr overall and AHI 36.8/hr during REM sleep.  Oxygen saturations were as low as 86% during respiratory events.      Review of Systems  Constitutional: Negative for fever, chills and fatigue.  HENT: Negative for nosebleeds, postnasal drip and rhinorrhea.   Respiratory: Negative for cough, shortness of breath and wheezing.   Cardiovascular: Negative for chest pain, palpitations and leg swelling.       Objective:   Physical Exam Filed Vitals:   05/18/15 1457  BP: 132/68  Pulse: 58  Height: '5\' 7"'$  (1.702 m)  Weight: 251 lb (113.853 kg)  SpO2: 95%   RA  Gen: well appearing HENT: OP clear, TM's clear, neck supple PULM: CTA B, normal percussion CV: RRR, no mgr, trace edema GI: BS+, soft, nontender Derm: no cyanosis or rash Psyche: normal mood and affect   02/2015 CT chest [facility]) :  1. There are several ill-defined hypodense lesions in the liver which are incompletely characterized on this exam. A hyperenhancing lesion in the right lobe of the liver may  represent a perfusion anomaly or flash filling hemangioma. Recommend further evaluation with liver MRI. 2. A groundglass opacity in the right upper lobe measuring 10 mm is suspicious for adenocarcinoma in situ. Attention recommended on follow-up.      Assessment & Plan:  Asthma, chronic obstructive, with acute exacerbation ([place]) He has chronic obstructive asthma which has been well controlled with the addition of Symbicort. I'm hopeful that with ongoing well-controlled symptoms we may be able to cut back on his dosing to just an inhaled corticosteroid on the next visit.  Plan: Continue Symbicort Continue Singulair Follow-up 3-4 months  Solitary pulmonary nodule He was found to have a 1 cm groundglass nodule in the right upper lobe on a CT chest performed at [facility]. It sounds as if he has  plans for a CT chest this month. I agree with that plan. If it stable then he could have a follow-up at 6-12 months. However, if it's growing that I recommend a lobectomy.     Current outpatient prescriptions:  .  acetaminophen (TYLENOL) 325 MG tablet, Take 2 tablets (650 mg total) by mouth every 4 (four) hours as needed for headache or mild pain., Disp: , Rfl:  .  albuterol (PROVENTIL HFA;VENTOLIN HFA) 108 (90 BASE) MCG/ACT inhaler, Inhale 2 puffs into the lungs every 6 (six) hours as needed for wheezing or shortness of breath., Disp: , Rfl:  .  apixaban (ELIQUIS) 5 MG TABS tablet, Take 1 tablet (5 mg total) by mouth 2 (two) times daily., Disp: 180 tablet, Rfl: 2 .  budesonide-formoterol (SYMBICORT) 80-4.5 MCG/ACT inhaler, Inhale 2 puffs into the lungs 2 (two) times daily., Disp: 3 Inhaler, Rfl: 1 .  Calcium Carbonate-Vitamin D (CALCIUM + D) 600-200 MG-UNIT TABS, Take 1 tablet by mouth 2 (two) times daily., Disp: , Rfl:  .  diltiazem (CARDIZEM CD) 120 MG 24 hr capsule, Take 1 capsule (120 mg total) by mouth daily., Disp: 90 capsule, Rfl: 0 .  flecainide (TAMBOCOR) 50 MG tablet, Take 1 tablet by mouth  every 12 hours, Disp: 60 tablet, Rfl: 1 .  fluticasone (FLONASE) 50 MCG/ACT nasal spray, Place 2 sprays into the nose at bedtime as needed for allergies. , Disp: , Rfl:  .  hydroxypropyl methylcellulose (ISOPTO TEARS) 2.5 % ophthalmic solution, Place 1 drop into both eyes 3 (three) times daily as needed. For dry eyes, Disp: , Rfl:  .  montelukast (SINGULAIR) 10 MG tablet, Take 10 mg by mouth daily before breakfast. , Disp: , Rfl:  .  Multiple Vitamins tablet, Take 1 tablet by mouth daily., Disp: , Rfl:  .  nitroGLYCERIN (NITROSTAT) 0.4 MG SL tablet, Place 1 tablet (0.4 mg total) under the tongue every 5 (five) minutes x 3 doses as needed for chest pain., Disp: 25 tablet, Rfl: 3 .  Omega-3 Fatty Acids (FISH OIL) 1000 MG CAPS, Take 1,000 mg by mouth 2 (two) times daily., Disp: , Rfl:  .  omeprazole  (PRILOSEC) 40 MG capsule, Take 40 mg by mouth at bedtime. , Disp: , Rfl:  .  Spacer/Aero-Holding Chambers (AEROCHAMBER MV) inhaler, Use as instructed, Disp: 1 each, Rfl: 0 .  telmisartan (MICARDIS) 80 MG tablet, Take 0.5 tablets (40 mg total) by mouth daily., Disp: 30 tablet, Rfl: 0

## 2015-05-18 NOTE — Assessment & Plan Note (Signed)
He was found to have a 1 cm groundglass nodule in the right upper lobe on a CT chest performed at [facility]. It sounds as if he has plans for a CT chest this month. I agree with that plan. If it stable then he could have a follow-up at 6-12 months. However, if it's growing that I recommend a lobectomy.

## 2015-05-22 ENCOUNTER — Ambulatory Visit (INDEPENDENT_AMBULATORY_CARE_PROVIDER_SITE_OTHER): Payer: Medicare Other | Admitting: Interventional Cardiology

## 2015-05-22 ENCOUNTER — Encounter: Payer: Self-pay | Admitting: Interventional Cardiology

## 2015-05-22 VITALS — BP 120/64 | HR 67 | Ht 67.0 in | Wt 251.4 lb

## 2015-05-22 DIAGNOSIS — I480 Paroxysmal atrial fibrillation: Secondary | ICD-10-CM

## 2015-05-22 DIAGNOSIS — G4733 Obstructive sleep apnea (adult) (pediatric): Secondary | ICD-10-CM | POA: Diagnosis not present

## 2015-05-22 DIAGNOSIS — I10 Essential (primary) hypertension: Secondary | ICD-10-CM | POA: Diagnosis not present

## 2015-05-22 DIAGNOSIS — Z7901 Long term (current) use of anticoagulants: Secondary | ICD-10-CM | POA: Diagnosis not present

## 2015-05-22 MED ORDER — FLECAINIDE ACETATE 50 MG PO TABS: 50.0000 mg | ORAL_TABLET | Freq: Two times a day (BID) | ORAL | Status: DC

## 2015-05-22 MED ORDER — APIXABAN 5 MG PO TABS: 5.0000 mg | ORAL_TABLET | Freq: Two times a day (BID) | ORAL | Status: DC

## 2015-05-22 NOTE — Progress Notes (Signed)
Patient ID: [name] [name], male    DOB: 1940-03-15, 75 y.o.   MRN: [id number]  HPI    Review of Systems    Physical Exam        Cardiology Office Note   Date:  05/22/2015   ID:  [name] [name], [name] [name] 08/29/1940, MRN [id number]  PCP:  Simona Huh, MD  Cardiologist:  Sinclair Grooms, MD   Chief Complaint  Patient presents with  . Atrial Fibrillation      History of Present Illness: [name] [name] is a 75 y.o. male who presents for Follow-up of atrial fibrillation, obstructive sleep apnea, and recurrent chest pain without significant CAD.  Paroxysmal atrial fibrillation is been the genesis of the patient's discomfort. He is now on flecainide and has had no recurrences. He is on Eliquis without bleeding complications. We did a sleep study and it demonstrates moderate sleep apnea. He is reluctant to have the C Pap titration because he doesn't feel he will tolerate the mask and there are financial constraints.    Past Medical History  Diagnosis Date  . Hypertension   . Dysrhythmia     palpitations, followed by Dr.Ehinger, seen in prep for surgery on 03/27/2011   . Angina     chest pain- cardiac cath. followed in 2010, record available ,  told then that he should f/u /w Dr. Marisue Humble    . Asthma     uses  singulair  . Pneumonia     hosp. 20 yrs. ago  . Sleep apnea     study done, 10 yrs. ago, told that he needed  CPAP but  never used   . Hepatitis     jaundice- many yrs. ago  . Chronic kidney disease     prostate cancer - surg. removal- 1996  . Arthritis     R knee, back    . Hiatal hernia   . GERD (gastroesophageal reflux disease)   . Diverticulitis   . Colon polyps   . Cancer [facility])     prostate, melanoma- 2010, excision    . Prostate cancer ([place]) 1995  . 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 OSA (obstructive sleep apnea) 04/11/2015    Mild to moderate OSA with AHI 13.3/hr overall and AHI 36.8/hr during REM sleep.  Oxygen saturations were as low as 86% during  respiratory events.    Past Surgical History  Procedure Laterality Date  . Cardiac catheterization      2010  . Tonsillectomy      as child  . Fracture surgery      L wrist, hardware- 1982  . Joint replacement      L knee, 2009  . Prostatectomy      for ca  . Total knee arthroplasty  04/22/2011    Procedure: TOTAL KNEE ARTHROPLASTY;  Surgeon: Garald Balding, MD;  Location: [place] [facility];  Service: Orthopedics;  Laterality: Right;  . Left heart catheterization with coronary angiogram N/A 09/08/2011    Procedure: LEFT HEART CATHETERIZATION WITH CORONARY ANGIOGRAM;  Surgeon: Sinclair Grooms, MD;  Location: [facility] CATH LAB;  Service: Cardiovascular;  Laterality: N/A;     Current Outpatient Prescriptions  Medication Sig Dispense Refill  . acetaminophen (TYLENOL) 325 MG tablet Take 2 tablets (650 mg total) by mouth every 4 (four) hours as needed for headache or mild pain.    Marland Kitchen albuterol (PROVENTIL HFA;VENTOLIN HFA) 108 (90 BASE) MCG/ACT inhaler Inhale 2 puffs into the lungs every  6 (six) hours as needed for wheezing or shortness of breath.    Marland Kitchen apixaban (ELIQUIS) 5 MG TABS tablet Take 1 tablet (5 mg total) by mouth 2 (two) times daily. 180 tablet 2  . budesonide-formoterol (SYMBICORT) 80-4.5 MCG/ACT inhaler Inhale 2 puffs into the lungs 2 (two) times daily. 3 Inhaler 1  . Calcium Carbonate-Vitamin D (CALCIUM + D) 600-200 MG-UNIT TABS Take 1 tablet by mouth 2 (two) times daily.    Marland Kitchen diltiazem (CARDIZEM CD) 120 MG 24 hr capsule Take 1 capsule (120 mg total) by mouth daily. 90 capsule 0  . flecainide (TAMBOCOR) 50 MG tablet Take 1 tablet by mouth  every 12 hours 60 tablet 1  . fluticasone (FLONASE) 50 MCG/ACT nasal spray Place 2 sprays into the nose at bedtime as needed for allergies.     . hydroxypropyl methylcellulose (ISOPTO TEARS) 2.5 % ophthalmic solution Place 1 drop into both eyes 3 (three) times daily as needed. For dry eyes    . montelukast (SINGULAIR) 10 MG tablet Take 10 mg by mouth daily  before breakfast.     . Multiple Vitamins tablet Take 1 tablet by mouth daily.    . nitroGLYCERIN (NITROSTAT) 0.4 MG SL tablet Place 1 tablet (0.4 mg total) under the tongue every 5 (five) minutes x 3 doses as needed for chest pain. 25 tablet 3  . Omega-3 Fatty Acids (FISH OIL) 1000 MG CAPS Take 1,000 mg by mouth 2 (two) times daily.    Marland Kitchen omeprazole (PRILOSEC) 40 MG capsule Take 40 mg by mouth at bedtime.     Marland Kitchen Spacer/Aero-Holding Chambers (AEROCHAMBER MV) inhaler Use as instructed 1 each 0  . telmisartan (MICARDIS) 80 MG tablet Take 0.5 tablets (40 mg total) by mouth daily. 30 tablet 0   No current facility-administered medications for this visit.    Allergies:   Carvedilol; Lisinopril; Tenormin; and Zolpidem tartrate    Social History:  The patient  reports that he quit smoking about 32 years ago. His smoking use included Cigarettes. He has a 45 pack-year smoking history. He has never used smokeless tobacco. He reports that he does not drink alcohol or use illicit drugs.   Family History:  The patient's family history includes Alzheimer's disease in his father; Breast cancer in his mother; Crohn's disease in his brother; Heart disease in his brother; Hypertension in his brother. There is no history of Anesthesia problems, Hypotension, Malignant hyperthermia, or Pseudochol deficiency.    ROS:  Please see the history of present illness.   Otherwise, review of systems are positive for Snoring and fatigue..   All other systems are reviewed and negative.    PHYSICAL EXAM: VS:  BP 120/64 mmHg  Pulse 67  Ht '5\' 7"'$  (1.702 m)  Wt 251 lb 6.4 oz (114.034 kg)  BMI 39.37 kg/m2 , BMI Body mass index is 39.37 kg/(m^2). GEN: Well nourished, well developed, in no acute distress HEENT: normal Neck: no JVD, carotid bruits, or masses Cardiac: RRR.  There is no murmur, rub, or gallop. There is no edema. Respiratory:  clear to auscultation bilaterally, normal work of breathing. GI: soft, nontender,  nondistended, + BS MS: no deformity or atrophy Skin: warm and dry, no rash Neuro:  Strength and sensation are intact Psych: euthymic mood, full affect   EKG:  EKG is not ordered today   Recent Labs: 01/26/2015: ALT 11*; TSH 2.270 03/17/2015: B Natriuretic Peptide 36.7; BUN 20; Creatinine, Ser 1.22; Hemoglobin 14.0; Magnesium 2.0; Platelets 164; Potassium 3.7; Sodium  146*    Lipid Panel No results found for: CHOL, TRIG, HDL, CHOLHDL, VLDL, LDLCALC, LDLDIRECT    Wt Readings from Last 3 Encounters:  05/22/15 251 lb 6.4 oz (114.034 kg)  05/18/15 251 lb (113.853 kg)  04/08/15 245 lb (111.131 kg)      Other studies Reviewed: Additional studies/ records that were reviewed today include. The findings include recent electronic health records demonstrating atrial fibrillation as a cause of his symptoms. Recognizing flecainide is been started and symptoms have abated. He is tolerating both anticoagulation and flecainide therapy..    ASSESSMENT AND PLAN:  1. PAF (paroxysmal atrial fibrillation) ([place]) Documented and now suppressed with flecainide  2. Chronic anticoagulation Eliquis therapy without complications  3. Essential hypertension Controlled  4. Obstructive sleep apnea Moderate OSA has been documented but not yet treated    Current medicines are reviewed at length with the patient today.  The patient has the following concerns regarding medicines: None.  The following changes/actions have been instituted:    Continue Eliquis  Continue flecainide 50 mg twice a day  Labs/ tests ordered today include:  No orders of the defined types were placed in this encounter.     Disposition:   FU with HS in 4 months  Signed, Sinclair Grooms, MD  05/22/2015 10:24 AM    [facility] [address], [place], [place]  [address] Phone: [phone]; Fax: [phone]

## 2015-05-22 NOTE — Patient Instructions (Signed)
Medication Instructions:  Your physician recommends that you continue on your current medications as directed. Please refer to the Current Medication list given to you today.   Labwork: None ordered  Testing/Procedures: None ordered  Follow-Up: Your physician wants you to follow-up in: 4 months with Dr.Smith You will receive a reminder letter in the mail two months in advance. If you don't receive a letter, please call our office to schedule the follow-up appointment.   Any Other Special Instructions Will Be Listed Below (If Applicable).     If you need a refill on your cardiac medications before your next appointment, please call your pharmacy.

## 2015-05-22 NOTE — Addendum Note (Signed)
Addended by: Lamar Laundry on: 05/22/2015 10:53 AM   Modules accepted: Orders

## 2015-06-15 NOTE — Telephone Encounter (Signed)
Received a message that patient wanted to discuss being set up with CPAP.  When I called and talked to him about this, he stated that he is sill not willing to proceed at this time.

## 2015-06-26 ENCOUNTER — Other Ambulatory Visit: Payer: Self-pay | Admitting: Interventional Cardiology

## 2015-07-13 ENCOUNTER — Other Ambulatory Visit: Payer: Self-pay | Admitting: *Deleted

## 2015-07-13 MED ORDER — FLECAINIDE ACETATE 50 MG PO TABS: 50.0000 mg | ORAL_TABLET | Freq: Two times a day (BID) | ORAL | Status: DC

## 2015-07-17 ENCOUNTER — Other Ambulatory Visit: Payer: Self-pay

## 2015-07-17 MED ORDER — APIXABAN 5 MG PO TABS: 5.0000 mg | ORAL_TABLET | Freq: Two times a day (BID) | ORAL | Status: DC

## 2015-07-30 ENCOUNTER — Telehealth: Payer: Self-pay

## 2015-07-30 ENCOUNTER — Telehealth: Payer: Self-pay | Admitting: Interventional Cardiology

## 2015-07-30 NOTE — Telephone Encounter (Signed)
Opened in error

## 2015-07-30 NOTE — Telephone Encounter (Signed)
New Message  Patient calling the office for samples of medication:   1.  What medication and dosage are you requesting samples for? Eliquis   2.  Are you currently out of this medication? No  Comments: Pt wife called states that she brought a form up to the office for help to purchase this medication hasn't heard anything else. They have 2 weeks left and may need samples if she doesn't hear anything back. Please advise.

## 2015-07-30 NOTE — Telephone Encounter (Signed)
Spoke with wife. She received a letter of denial from [employer] as well.  Will try other forms of patient assistance.

## 2015-08-14 ENCOUNTER — Telehealth: Payer: Self-pay | Admitting: Interventional Cardiology

## 2015-08-14 NOTE — Telephone Encounter (Signed)
New message ° ° ° ° ° ° °Patient calling the office for samples of medication: ° ° °1.  What medication and dosage are you requesting samples for? eliquis 5mg ° °2.  Are you currently out of this medication? Almost out ° ° °

## 2015-08-14 NOTE — Telephone Encounter (Signed)
Samples placed at the front desk. Patients wife will call insurance to see what medication would be more affordable on his plan.

## 2015-08-20 ENCOUNTER — Encounter: Payer: Self-pay | Admitting: Pulmonary Disease

## 2015-08-20 ENCOUNTER — Ambulatory Visit (INDEPENDENT_AMBULATORY_CARE_PROVIDER_SITE_OTHER)
Admission: RE | Admit: 2015-08-20 | Discharge: 2015-08-20 | Disposition: A | Discharge status: Home / Self Care | Payer: Medicare Other | Source: Ambulatory Visit | Attending: Pulmonary Disease | Admitting: Pulmonary Disease

## 2015-08-20 ENCOUNTER — Other Ambulatory Visit (INDEPENDENT_AMBULATORY_CARE_PROVIDER_SITE_OTHER): Payer: Medicare Other

## 2015-08-20 ENCOUNTER — Ambulatory Visit (INDEPENDENT_AMBULATORY_CARE_PROVIDER_SITE_OTHER): Payer: Medicare Other | Admitting: Pulmonary Disease

## 2015-08-20 VITALS — BP 130/70 | HR 58 | Ht 67.0 in | Wt 251.8 lb

## 2015-08-20 DIAGNOSIS — R0600 Dyspnea, unspecified: Secondary | ICD-10-CM

## 2015-08-20 DIAGNOSIS — R0602 Shortness of breath: Secondary | ICD-10-CM | POA: Insufficient documentation

## 2015-08-20 DIAGNOSIS — R911 Solitary pulmonary nodule: Secondary | ICD-10-CM

## 2015-08-20 LAB — BRAIN NATRIURETIC PEPTIDE: Pro B Natriuretic peptide (BNP): 65 pg/mL (ref 0.0–100.0)

## 2015-08-20 MED ORDER — PREDNISONE 10 MG PO TABS: 20.0000 mg | ORAL_TABLET | Freq: Every day | ORAL | Status: DC

## 2015-08-20 NOTE — Assessment & Plan Note (Signed)
As detail above I believe he is having a mild asthma flare but I'm going to evaluate for CHF.  Plan: Prednisone taper See details above Continue Symbicort Follow-up one week

## 2015-08-20 NOTE — Assessment & Plan Note (Signed)
[name] [name] reports worsening dyspnea in the last week. Given his leg swelling, crackles on exam and elevated JVD I have concern that this may be CHF.  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  Plan: Treat as if this is an asthma flare with 5 days of prednisone Check a chest x-ray and proBNP to evaluate for CHF Return in one week, if no improvement after prednisone then start diuretics and check an echocardiogram

## 2015-08-20 NOTE — Patient Instructions (Signed)
Take the prednisone taper as prescribed We will call you with the results of the chest x-ray in the bloodwork Follow-up with [place] in one week

## 2015-08-20 NOTE — Progress Notes (Signed)
Subjective:    Patient ID: [name] [name], male    DOB: Aug 16, 1940, 75 y.o.   MRN: [id number]  Synopsis: Referred in 2016 for dyspnea due to asthma based on PFT showing reversible airflow obstruction. He quit smoking around 1975; he smoked for about 30-35 years 1.5 ppd.  November 2016 echocardiogram LVEF 60-65%, moderate to severe left atrial enlargement, normal RV size and function November 2016 chest x-ray mild atelectasis left base, likely chronic otherwise normal, cardiomegaly noted May 2015 PFT> pre-albuterol ratio 65%, FEV1 2.26 L (83% predicted), post albuterol FEV1 2.65 L (17% predicted, 400 mL change), total lung capacity 6.40 L (99% predicted) disease, DLCO 28.25 (99% pred)  Childhood polio, was in an iron lung  HPI Chief Complaint  Patient presents with  . Follow-up    pt has good and bad days- c/o increase SOB with exertion, has a prod cough with clear mucus after using inhalers.     Blanca says that he is not breathing as well as he was.  This has been a problem for the last week. He was stable until one week ago.  In the last week he actually used his albuterol which he never used much before. He has not sick contacts, no fever, chills.  He feels fatigue, no energy.  He feels dyspneic at rest.  He is wheezing more and coughing more.  Some mucus production, not every time.  He has been compliant with Symbicort.  SOmetimes he produces more mucus after the Symbicort.  He has a dry cough during the day.  No flares since the last visit requiring antibiottics or prednsone.  NO change in baseline leg swelling.    No sinus congesiton, post nasal drip or runny nose.  He has dry eyes but no itching.  No scratchy throat.  He doesn't recall having more problems like this this time of year.   Past Medical History  Diagnosis Date  . Hypertension   . Dysrhythmia     palpitations, followed by Dr.Ehinger, seen in prep for surgery on 03/27/2011   . Angina     chest pain- cardiac  cath. followed in 2010, record available ,  told then that he should f/u /w Dr. Marisue Humble    . Asthma     uses  singulair  . Pneumonia     hosp. 20 yrs. ago  . Sleep apnea     study done, 10 yrs. ago, told that he needed  CPAP but  never used   . Hepatitis     jaundice- many yrs. ago  . Chronic kidney disease     prostate cancer - surg. removal- 1996  . Arthritis     R knee, back    . Hiatal hernia   . GERD (gastroesophageal reflux disease)   . Diverticulitis   . Colon polyps   . Cancer [facility])     prostate, melanoma- 2010, excision    . Prostate cancer ([place]) 1995  . 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 OSA (obstructive sleep apnea) 04/11/2015    Mild to moderate OSA with AHI 13.3/hr overall and AHI 36.8/hr during REM sleep.  Oxygen saturations were as low as 86% during respiratory events.      Review of Systems  Constitutional: Negative for fever, chills and fatigue.  HENT: Negative for nosebleeds, postnasal drip and rhinorrhea.   Respiratory: Negative for cough, shortness of breath and wheezing.   Cardiovascular: Negative for chest pain, palpitations and leg swelling.  Objective:   Physical Exam Filed Vitals:   08/20/15 1111  BP: 130/70  Pulse: 58  Height: '5\' 7"'$  (1.702 m)  Weight: 251 lb 12.8 oz (114.216 kg)  SpO2: 97%   RA  Gen: well appearing HENT: OP clear, TM's clear, neck supple PULM: Crackles B, normal percussion, no wheezing CV: Irreg irreg, no mgr, significant leg edema, Elevated JVD GI: BS+, soft, nontender Derm: no cyanosis or rash Psyche: normal mood and affect   05/2015 CT chest [facility]) . Lungs: Groundglass density right upper lobe 14 x 7 mm previously 13 x 7 mm. Image 44 series 4. Linear densities left base no change.       Assessment & Plan:  Dyspnea Bobbi reports worsening dyspnea in the last week. Given his leg swelling, crackles on exam and elevated JVD I have concern that this may be CHF.  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  Plan: Treat as if this is an asthma flare with 5 days of prednisone Check a chest x-ray and proBNP to evaluate for CHF Return in one week, if no improvement after prednisone then start diuretics and check an echocardiogram  Solitary pulmonary nodule The CT scan from March 2017 showed no change in his pulmonary nodule. He will continue follow-up there.  Asthma, chronic obstructive, with acute exacerbation ([place]) As detail above I believe he is having a mild asthma flare but I'm going to evaluate for CHF.  Plan: Prednisone taper See details above Continue Symbicort Follow-up one week     Current outpatient prescriptions:  .  acetaminophen (TYLENOL) 325 MG tablet, Take 2 tablets (650 mg total) by mouth every 4 (four) hours as needed for headache or mild pain., Disp: , Rfl:  .  albuterol (PROVENTIL HFA;VENTOLIN HFA) 108 (90 BASE) MCG/ACT inhaler, Inhale 2 puffs into the lungs every 6 (six) hours as needed for wheezing or shortness of breath., Disp: , Rfl:  .  apixaban (ELIQUIS) 5 MG TABS tablet, Take 1 tablet (5 mg total) by mouth 2 (two) times daily., Disp: 180 tablet, Rfl: 3 .  budesonide-formoterol (SYMBICORT) 80-4.5 MCG/ACT inhaler, Inhale 2 puffs into the lungs 2 (two) times daily., Disp: 3 Inhaler, Rfl: 1 .  Calcium Carbonate-Vitamin D (CALCIUM + D) 600-200 MG-UNIT TABS, Take 1 tablet by mouth 2 (two) times daily., Disp: , Rfl:  .  diltiazem (CARDIZEM CD) 120 MG 24 hr capsule, Take 1 capsule by mouth  daily, Disp: 90 capsule, Rfl: 3 .  flecainide (TAMBOCOR) 50 MG tablet, Take 1 tablet (50 mg total) by mouth 2 (two) times daily., Disp: 28 tablet, Rfl: 0 .  fluticasone (FLONASE) 50 MCG/ACT nasal spray, Place 2 sprays into the nose at bedtime as needed for allergies. , Disp: , Rfl:  .  hydroxypropyl methylcellulose (ISOPTO  TEARS) 2.5 % ophthalmic solution, Place 1 drop into both eyes 3 (three) times daily as needed. For dry eyes, Disp: , Rfl:  .  montelukast (SINGULAIR) 10 MG tablet, Take 10 mg by mouth daily before breakfast. , Disp: , Rfl:  .  Multiple Vitamins tablet, Take 1 tablet by mouth daily., Disp: , Rfl:  .  nitroGLYCERIN (NITROSTAT) 0.4 MG SL tablet, Place 1 tablet (0.4 mg total) under the tongue every 5 (five) minutes x 3 doses as needed for chest pain., Disp: 25 tablet, Rfl: 3 .  Omega-3 Fatty  Acids (FISH OIL) 1000 MG CAPS, Take 1,000 mg by mouth 2 (two) times daily., Disp: , Rfl:  .  omeprazole (PRILOSEC) 40 MG capsule, Take 40 mg by mouth at bedtime. , Disp: , Rfl:  .  Spacer/Aero-Holding Chambers (AEROCHAMBER MV) inhaler, Use as instructed, Disp: 1 each, Rfl: 0 .  telmisartan (MICARDIS) 80 MG tablet, Take 0.5 tablets (40 mg total) by mouth daily., Disp: 30 tablet, Rfl: 0 .  predniSONE (DELTASONE) 10 MG tablet, Take 2 tablets (20 mg total) by mouth daily with breakfast., Disp: 10 tablet, Rfl: 0

## 2015-08-20 NOTE — Assessment & Plan Note (Signed)
The CT scan from March 2017 showed no change in his pulmonary nodule. He will continue follow-up there.

## 2015-08-23 ENCOUNTER — Telehealth: Payer: Self-pay | Admitting: Pulmonary Disease

## 2015-08-23 NOTE — Telephone Encounter (Signed)
Result Notes     Notes Recorded by Len Blalock, CMA on 08/23/2015 at 11:01 AM lmtcb X2 for pt ------  Notes Recorded by Virl Cagey, CMA on 08/22/2015 at 5:24 PM LM x 1 ------  Notes Recorded by Juanito Doom, MD on 08/20/2015 at 2:46 PM A, Please let the patient know this and his blood work were [employer], B   Spoke with pt's wife. She is aware of results. Nothing further was needed.

## 2015-08-27 ENCOUNTER — Encounter: Payer: Self-pay | Admitting: Pulmonary Disease

## 2015-08-27 ENCOUNTER — Ambulatory Visit (INDEPENDENT_AMBULATORY_CARE_PROVIDER_SITE_OTHER): Payer: Medicare Other | Admitting: Pulmonary Disease

## 2015-08-27 VITALS — BP 112/62 | HR 53 | Ht 67.0 in | Wt 253.0 lb

## 2015-08-27 DIAGNOSIS — J45901 Unspecified asthma with (acute) exacerbation: Secondary | ICD-10-CM | POA: Diagnosis not present

## 2015-08-27 DIAGNOSIS — J441 Chronic obstructive pulmonary disease with (acute) exacerbation: Secondary | ICD-10-CM

## 2015-08-27 DIAGNOSIS — R911 Solitary pulmonary nodule: Secondary | ICD-10-CM | POA: Diagnosis not present

## 2015-08-27 NOTE — Progress Notes (Signed)
Current Outpatient Prescriptions on File Prior to Visit  Medication Sig  . acetaminophen (TYLENOL) 325 MG tablet Take 2 tablets (650 mg total) by mouth every 4 (four) hours as needed for headache or mild pain.  Marland Kitchen albuterol (PROVENTIL HFA;VENTOLIN HFA) 108 (90 BASE) MCG/ACT inhaler Inhale 2 puffs into the lungs every 6 (six) hours as needed for wheezing or shortness of breath.  Marland Kitchen apixaban (ELIQUIS) 5 MG TABS tablet Take 1 tablet (5 mg total) by mouth 2 (two) times daily.  . budesonide-formoterol (SYMBICORT) 80-4.5 MCG/ACT inhaler Inhale 2 puffs into the lungs 2 (two) times daily.  . Calcium Carbonate-Vitamin D (CALCIUM + D) 600-200 MG-UNIT TABS Take 1 tablet by mouth 2 (two) times daily.  Marland Kitchen diltiazem (CARDIZEM CD) 120 MG 24 hr capsule Take 1 capsule by mouth  daily  . flecainide (TAMBOCOR) 50 MG tablet Take 1 tablet (50 mg total) by mouth 2 (two) times daily.  . fluticasone (FLONASE) 50 MCG/ACT nasal spray Place 2 sprays into the nose at bedtime as needed for allergies.   . hydroxypropyl methylcellulose (ISOPTO TEARS) 2.5 % ophthalmic solution Place 1 drop into both eyes 3 (three) times daily as needed. For dry eyes  . montelukast (SINGULAIR) 10 MG tablet Take 10 mg by mouth daily before breakfast.   . Multiple Vitamins tablet Take 1 tablet by mouth daily.  . nitroGLYCERIN (NITROSTAT) 0.4 MG SL tablet Place 1 tablet (0.4 mg total) under the tongue every 5 (five) minutes x 3 doses as needed for chest pain.  . Omega-3 Fatty Acids (FISH OIL) 1000 MG CAPS Take 1,000 mg by mouth 2 (two) times daily.  Marland Kitchen omeprazole (PRILOSEC) 40 MG capsule Take 40 mg by mouth at bedtime.   Marland Kitchen Spacer/Aero-Holding Chambers (AEROCHAMBER MV) inhaler Use as instructed  . telmisartan (MICARDIS) 80 MG tablet Take 0.5 tablets (40 mg total) by mouth daily.   No current facility-administered medications on file prior to visit.     Chief Complaint  Patient presents with  . Follow-up    1 wk f/u - reports some improvement in  SOB. Pt completed the Prednisone. Denies any other symptoms.      Tests November 2016 echocardiogram LVEF 60-65%, moderate to severe left atrial enlargement, normal RV size and function November 2016 chest x-ray mild atelectasis left base, likely chronic otherwise normal, cardiomegaly noted May 2015 PFT> pre-albuterol ratio 65%, FEV1 2.26 L (83% predicted), post albuterol FEV1 2.65 L (17% predicted, 400 mL change), total lung capacity 6.40 L (99% predicted) disease, DLCO 28.25 (99% pred)   Past medical hx  has a past medical history of Hypertension; Dysrhythmia; Angina; Asthma; Pneumonia; Sleep apnea; Hepatitis; Chronic kidney disease; Arthritis; Hiatal hernia; GERD (gastroesophageal reflux disease); Diverticulitis; Colon polyps; Cancer ([place]); Prostate cancer ([place]) (1995); Melanoma ([place]); and OSA (obstructive sleep apnea) (04/11/2015).  Childhood polio, was in iron lung   Past surgical hx, Allergies, Family hx, Social hx all reviewed.  Vital Signs BP 112/62 mmHg  Pulse 53  Ht '5\' 7"'$  (1.702 m)  Wt 253 lb (114.76 kg)  BMI 39.62 kg/m2  SpO2 94%  History of Present Illness [name] [name] is a 75 y.o. male, former smoker (quit 1975, 30-35 years 1.5ppd), with a PMH of HTN, Angina, CKD, Hepatitis, GERD, hiatal hernia, diverticulitis, OSA, childhood polio (was in iron lung), & asthma who presented to the pulmonary office on 6/12 with increased SOB and productive cough.  He was treated with 5 days of prednisone with close follow up.  He returns  6/19 for repeat evaluation.    The patient reports feeling better.  He reports reduction of shortness of breath and cough.  He does note swelling but it is no more than "his usual" (wife agrees).  Pro BNP on 6/12 63 & CXR showed normal heart size, no edema or airspace disease.  The patient denies fevers, chills, sputum production.    Reviewed medications with the patient & he reports he currently has his medications but is in the donut hole.  He reports he  is on two inhalers and Eliquis and the Eliquis alone is tremendously expensive (~$800 cost per month).    Physical Exam  General - well developed adult in no acute distress ENT - No sinus tenderness, no oral exudate, no LAN Cardiac - s1s2 regular, no murmur Chest - even/non-labored, lungs bilaterally clear anterior.  Fine basilar crackles that clear with cough.  No wheeze Back - No focal tenderness Abd - Soft, non-tender Ext - No edema Neuro - Normal strength Skin - No rashes Psych - normal mood, and behavior   Assessment/Plan  Asthma, Chronic Obstructive, with acute exacerbation - 6/12, resolved after prednisone x5 days  Dyspnea - acute episode resolved  Solitary Pulmonary Nodule    Plan: Continue symbicort, samples given  Follow up with Dr. Lake Bells in 3 months or sooner if new needs arise Follow up regarding pulmonary nodule with Dr. Lake Bells for surveillance screening Discussed duonebs with the patient given cost of symbicort.  Currently, he wants to continue symbicort as he is very active.     Lower Extremity Edema   Plan: Patient encouraged to use his compression stockings   Patient Instructions  1.  Continue your Symbicort as prescribed 2.  Wear your compression stockings during the day.  You can take them off at night when your feet are elevated.  3.  Follow up with Dr. Lake Bells in 3 months  4.  Please call if new or worsening symptoms    Noe Gens, NP-C [facility]  [phone] 08/28/2015, 3:38 PM

## 2015-08-27 NOTE — Patient Instructions (Signed)
1.  Continue your Symbicort as prescribed 2.  Wear your compression stockings during the day.  You can take them off at night when your feet are elevated.  3.  Follow up with Dr. Lake Bells in 3 months  4.  Please call if new or worsening symptoms

## 2015-09-20 ENCOUNTER — Telehealth: Payer: Self-pay | Admitting: Pulmonary Disease

## 2015-09-20 NOTE — Telephone Encounter (Signed)
Spoke with pt's wife and she states that when the inhaler is inserted in the end of the chamber that some of the medication seems to be leaking out. She states that they got the chamber in January. Asked her if they could bring chamber to office so that we can make sure it is being used correctly and possibly replace if it is defective. She states that they will be in [facility] tomorrow and will stop by the office.   Holding in triage for pt&wife to come to office.

## 2015-09-21 MED ORDER — AEROCHAMBER MV MISC: Status: DC

## 2015-09-21 NOTE — Telephone Encounter (Signed)
The pt came in with aerochamber but no inhaler  He is convinced that there is a leak in the aerochamber  I did not see one  I gave him a new one  Nothing further needed

## 2015-09-21 NOTE — Telephone Encounter (Signed)
PATIENT IN LOBBY

## 2015-09-29 ENCOUNTER — Emergency Department (HOSPITAL_COMMUNITY): Payer: Medicare Other

## 2015-09-29 ENCOUNTER — Encounter (HOSPITAL_COMMUNITY): Payer: Self-pay | Admitting: Emergency Medicine

## 2015-09-29 DIAGNOSIS — Z96653 Presence of artificial knee joint, bilateral: Secondary | ICD-10-CM | POA: Diagnosis not present

## 2015-09-29 DIAGNOSIS — Z8546 Personal history of malignant neoplasm of prostate: Secondary | ICD-10-CM | POA: Diagnosis not present

## 2015-09-29 DIAGNOSIS — Z87891 Personal history of nicotine dependence: Secondary | ICD-10-CM | POA: Insufficient documentation

## 2015-09-29 DIAGNOSIS — J45909 Unspecified asthma, uncomplicated: Secondary | ICD-10-CM | POA: Diagnosis not present

## 2015-09-29 DIAGNOSIS — I209 Angina pectoris, unspecified: Secondary | ICD-10-CM | POA: Insufficient documentation

## 2015-09-29 DIAGNOSIS — N189 Chronic kidney disease, unspecified: Secondary | ICD-10-CM | POA: Diagnosis not present

## 2015-09-29 DIAGNOSIS — I129 Hypertensive chronic kidney disease with stage 1 through stage 4 chronic kidney disease, or unspecified chronic kidney disease: Secondary | ICD-10-CM | POA: Insufficient documentation

## 2015-09-29 DIAGNOSIS — R079 Chest pain, unspecified: Secondary | ICD-10-CM | POA: Diagnosis present

## 2015-09-29 DIAGNOSIS — R0609 Other forms of dyspnea: Secondary | ICD-10-CM | POA: Diagnosis not present

## 2015-09-29 LAB — CBC
HCT: 42.1 % (ref 39.0–52.0)
HEMOGLOBIN: 13.8 g/dL (ref 13.0–17.0)
MCH: 29.8 pg (ref 26.0–34.0)
MCHC: 32.8 g/dL (ref 30.0–36.0)
MCV: 90.9 fL (ref 78.0–100.0)
PLATELETS: 159 10*3/uL (ref 150–400)
RBC: 4.63 MIL/uL (ref 4.22–5.81)
RDW: 13.1 % (ref 11.5–15.5)
WBC: 6.7 10*3/uL (ref 4.0–10.5)

## 2015-09-29 LAB — BASIC METABOLIC PANEL
ANION GAP: 6 (ref 5–15)
BUN: 10 mg/dL (ref 6–20)
CHLORIDE: 109 mmol/L (ref 101–111)
CO2: 27 mmol/L (ref 22–32)
CREATININE: 1.19 mg/dL (ref 0.61–1.24)
Calcium: 9 mg/dL (ref 8.9–10.3)
GFR calc non Af Amer: 57 mL/min — ABNORMAL LOW (ref >60)
Glucose, Bld: 95 mg/dL (ref 65–99)
POTASSIUM: 3.9 mmol/L (ref 3.5–5.1)
SODIUM: 142 mmol/L (ref 135–145)

## 2015-09-29 LAB — I-STAT TROPONIN, ED: Troponin i, poc: 0.01 ng/mL (ref 0.00–0.08)

## 2015-09-29 NOTE — ED Notes (Signed)
Pt presents to ED for assessment of CP that began last night.  Pt has a hx of a-fib, and his cardiologist recommended pt come to ED.  Pt sts he has had fatigue, lightheadedness, SOB, weakness, nausea, all starting last night.

## 2015-09-30 ENCOUNTER — Emergency Department (HOSPITAL_COMMUNITY)
Admission: EM | Admit: 2015-09-30 | Discharge: 2015-09-30 | Disposition: A | Discharge status: Home / Self Care | Payer: Medicare Other | Attending: Emergency Medicine | Admitting: Emergency Medicine

## 2015-09-30 DIAGNOSIS — I208 Other forms of angina pectoris: Secondary | ICD-10-CM

## 2015-09-30 DIAGNOSIS — R0609 Other forms of dyspnea: Secondary | ICD-10-CM

## 2015-09-30 DIAGNOSIS — R0600 Dyspnea, unspecified: Secondary | ICD-10-CM

## 2015-09-30 LAB — I-STAT TROPONIN, ED
TROPONIN I, POC: 0 ng/mL (ref 0.00–0.08)
Troponin i, poc: 0 ng/mL (ref 0.00–0.08)

## 2015-09-30 LAB — BRAIN NATRIURETIC PEPTIDE: B Natriuretic Peptide: 193.6 pg/mL — ABNORMAL HIGH (ref 0.0–100.0)

## 2015-09-30 NOTE — ED Notes (Signed)
See downtime documentation

## 2015-09-30 NOTE — Discharge Instructions (Signed)
We saw you in the ER for the chest pain/shortness of breath. All of our cardiac workup is normal, including labs, EKG and chest X-RAY are normal. We are not sure what is causing your discomfort, but we feel comfortable sending you home at this time.   Please return to the ER if you have worsening chest pain, shortness of breath, pain radiating to your jaw, shoulder, or back, sweats or fainting. Otherwise see the Cardiologist or your primary care doctor as requested.

## 2015-10-16 NOTE — Progress Notes (Signed)
Cardiology Office Note   Date:  10/17/2015   ID:  Rogue, Rafalski Feb 22, 1941, MRN [id number]  PCP:  Simona Huh, MD  Cardiologist:  Dr. Tamala Julian    Chief Complaint  Patient presents with  . Chest Pain    episodes continue      History of Present Illness: [name] [name] is a 75 y.o. male who presents for post ER visit for chest pain.  Troponin was neg and BNP elevated at 193 but no HF on CXR.  CBC and BMP normal.   EKG SR with incomplete RBBB no acute changes.  He has a hx. of atrial fibrillation, obstructive sleep apnea, and recurrent chest pain without significant CAD.  Paroxysmal atrial fibrillation is been the genesis of the patient's discomfort. He is now on flecainide and has had no recurrences. He is on Eliquis without bleeding complications. We did a sleep study and it demonstrates moderate sleep apnea. He is reluctant to have the C Pap titration because he doesn't feel he will tolerate the mask and there are financial constraints.  Last Echo 01/2015 EF 60-65% mild LVH, MV with mild regurgitation.  LA mildly dilated Last cardiac cath 09/08/11 with no significant CAD, RCA with diffuse irregularities prx, mid and distal.  Previous cath 2010 with normal cors.    Nuc in Jan with no reversible ischemia, apical and lateral wall infarct. EF 55% He had presented with chest pain and A fib.    Today he has not had any more of the chest pressure but he has had sharp shooting pains in lt side.  He has 2 brief episodes of seconds on rapid heart beat but none with severe pain that took him to ER.  Pt concerned that he will have a heart attack or stroke.  Reassured about a stroke from a fib on Eliquis.    Chronic lower ext edema, discussed support stockings and decreasing salt.    Past Medical History:  Diagnosis Date  . Angina    chest pain- cardiac cath. followed in 2010, record available ,  told then that he should f/u /w Dr. Marisue Humble    . Arthritis    R knee, back    .  Asthma    uses  singulair  . Cancer [facility])    prostate, melanoma- 2010, excision    . Chronic kidney disease    prostate cancer - surg. removal- 1996  . Colon polyps   . Diverticulitis   . Dysrhythmia    palpitations, followed by Dr.Ehinger, seen in prep for surgery on 03/27/2011   . GERD (gastroesophageal reflux disease)   . Hepatitis    jaundice- many yrs. ago  . Hiatal hernia   . Hypertension   . 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 OSA (obstructive sleep apnea) 04/11/2015   Mild to moderate OSA with AHI 13.3/hr overall and AHI 36.8/hr during REM sleep.  Oxygen saturations were as low as 86% during respiratory events.  . Pneumonia    hosp. 20 yrs. ago  . Prostate cancer ([place]) 1995  . Sleep apnea    study done, 10 yrs. ago, told that he needed  CPAP but  never used     Past Surgical History:  Procedure Laterality Date  . CARDIAC CATHETERIZATION     2010  . FRACTURE SURGERY     L wrist, hardware- 1982  . JOINT REPLACEMENT     L knee, 2009  . LEFT HEART CATHETERIZATION WITH CORONARY ANGIOGRAM  N/A 09/08/2011   Procedure: LEFT HEART CATHETERIZATION WITH CORONARY ANGIOGRAM;  Surgeon: Sinclair Grooms, MD;  Location: [facility] CATH LAB;  Service: Cardiovascular;  Laterality: N/A;  . PROSTATECTOMY     for ca  . TONSILLECTOMY     as child  . TOTAL KNEE ARTHROPLASTY  04/22/2011   Procedure: TOTAL KNEE ARTHROPLASTY;  Surgeon: Garald Balding, MD;  Location: [facility];  Service: Orthopedics;  Laterality: Right;     Current Outpatient Prescriptions  Medication Sig Dispense Refill  . acetaminophen (TYLENOL) 325 MG tablet Take 2 tablets (650 mg total) by mouth every 4 (four) hours as needed for headache or mild pain.    Marland Kitchen albuterol (PROVENTIL HFA;VENTOLIN HFA) 108 (90 BASE) MCG/ACT inhaler Inhale 2 puffs into the lungs every 6 (six) hours as needed for wheezing or shortness of breath.    Marland Kitchen apixaban (ELIQUIS) 5 MG TABS tablet Take 1 tablet (5 mg total) by mouth 2 (two) times daily. 180 tablet 3  .  budesonide-formoterol (SYMBICORT) 80-4.5 MCG/ACT inhaler Inhale 2 puffs into the lungs 2 (two) times daily. 3 Inhaler 1  . Calcium Carbonate-Vitamin D (CALCIUM + D) 600-200 MG-UNIT TABS Take 1 tablet by mouth 2 (two) times daily.    Marland Kitchen diltiazem (CARDIZEM CD) 120 MG 24 hr capsule Take 1 capsule by mouth  daily 90 capsule 3  . flecainide (TAMBOCOR) 50 MG tablet Take 1 tablet (50 mg total) by mouth 2 (two) times daily. 28 tablet 0  . fluticasone (FLONASE) 50 MCG/ACT nasal spray Place 2 sprays into the nose at bedtime as needed for allergies.     . hydroxypropyl methylcellulose (ISOPTO TEARS) 2.5 % ophthalmic solution Place 1 drop into both eyes 3 (three) times daily as needed. For dry eyes    . montelukast (SINGULAIR) 10 MG tablet Take 10 mg by mouth daily before breakfast.     . Multiple Vitamins tablet Take 1 tablet by mouth daily.    . nitroGLYCERIN (NITROSTAT) 0.4 MG SL tablet Place 1 tablet (0.4 mg total) under the tongue every 5 (five) minutes x 3 doses as needed for chest pain. 25 tablet 3  . Omega-3 Fatty Acids (FISH OIL) 1000 MG CAPS Take 1,000 mg by mouth 2 (two) times daily.    Marland Kitchen omeprazole (PRILOSEC) 40 MG capsule Take 40 mg by mouth at bedtime.     Marland Kitchen Spacer/Aero-Holding Chambers (AEROCHAMBER MV) inhaler Use as instructed 1 each 0  . telmisartan (MICARDIS) 80 MG tablet Take 0.5 tablets (40 mg total) by mouth daily. 30 tablet 0   No current facility-administered medications for this visit.     Allergies:   Carvedilol; Lisinopril; Tenormin [atenolol]; and Zolpidem tartrate    Social History:  The patient  reports that he quit smoking about 32 years ago. His smoking use included Cigarettes. He has a 45.00 pack-year smoking history. He has never used smokeless tobacco. He reports that he does not drink alcohol or use drugs.   Family History:  The patient's family history includes Alzheimer's disease in his father; Breast cancer in his mother; Crohn's disease in his brother; Heart disease  in his brother; Hypertension in his brother.    ROS:  General:no colds or fevers, no weight changes Skin:no rashes or ulcers HEENT:no blurred vision, no congestion CV:see HPI PUL:see HPI GI:no diarrhea constipation or melena, no indigestion GU:no hematuria, no dysuria MS:no joint pain, no claudication Neuro:no syncope, no lightheadedness Endo:no diabetes, no thyroid disease Wt Readings from Last 3 Encounters:  10/17/15 255 lb 1.9 oz (115.7 kg)  08/27/15 253 lb (114.8 kg)  08/20/15 251 lb 12.8 oz (114.2 kg)     PHYSICAL EXAM: VS:  BP 130/76   Pulse (!) 56   Ht '5\' 7"'$  (1.702 m)   Wt 255 lb 1.9 oz (115.7 kg)   BMI 39.96 kg/m  , BMI Body mass index is 39.96 kg/m. General:Pleasant affect, NAD Skin:Warm and dry, brisk capillary refill HEENT:normocephalic, sclera clear, mucus membranes moist Neck:supple, no JVD, no bruits  Heart:S1S2 RRR without murmur, gallup, rub or click Lungs:clear without rales, rhonchi, or wheezes LKG:MWNU, non tender, + BS, do not palpate liver spleen or masses Ext:no lower ext edema, 2+ pedal pulses, 2+ radial pulses Neuro:alert and oriented, MAE, follows commands, + facial symmetry    EKG:  EKG is ordered today. The ekg ordered today demonstrates  SB at 68 and incomplete RBBB no acute changes.     Recent Labs: 01/26/2015: ALT 11; TSH 2.270 03/17/2015: Magnesium 2.0 08/20/2015: Pro B Natriuretic peptide (BNP) 65.0 09/29/2015: BUN 10; Creatinine, Ser 1.19; Hemoglobin 13.8; Platelets 159; Potassium 3.9; Sodium 142 09/30/2015: B Natriuretic Peptide 193.6    Lipid Panel No results found for: CHOL, TRIG, HDL, CHOLHDL, VLDL, LDLCALC, LDLDIRECT     Other studies Reviewed: Additional studies/ records that were reviewed today include: previous caths and nuc studies see above.   ASSESSMENT AND PLAN:  1. Chest pain episode seen in ER 8/10 pt very concerned.  Discussed with DOD Dr. Johnsie Cancel and we will plan cardiac cath.  Pt on Eliquis and needs to miss 3  doses so cath will be for Monday the 14th.  Pt has new script for sl NTG with plan to take for recurrent chest pain and if pain continues and is severe to go to ER.    The patient understands that risks included but are not limited to stroke (1 in 1000), death (1 in 30), kidney failure [usually temporary] (1 in 500), bleeding (1 in 200), allergic reaction [possibly serious] (1 in 200).    2. PAF (paroxysmal atrial fibrillation) (HCC) rare episode   3.Hypertension controlled continue meds.   4.  Asthma  5.  Dyspnea with activity chronic  6.  Normal coronary arteries 2013--nuc in 2017 was stable.   7.  Chronic anticoagulation no bleeding has not missed Eliquis.   8.  History of prostate cancer  9.  Obesity, Class II, BMI 35-39.9, with comorbidity ([place])   Current medicines are reviewed with the patient today.  The patient Has no concerns regarding medicines.  The following changes have been made:  See above Labs/ tests ordered today include:see above  Disposition:   FU:  see above  Signed, Cecilie Kicks, NP  10/17/2015 10:20 AM    [address] [place] [place], [place], [place], [place] Phone: [phone]; Fax: [phone]

## 2015-10-17 ENCOUNTER — Ambulatory Visit (INDEPENDENT_AMBULATORY_CARE_PROVIDER_SITE_OTHER): Payer: Medicare Other | Admitting: Cardiology

## 2015-10-17 ENCOUNTER — Encounter: Payer: Self-pay | Admitting: Cardiology

## 2015-10-17 VITALS — BP 130/76 | HR 56 | Ht 67.0 in | Wt 255.1 lb

## 2015-10-17 DIAGNOSIS — I480 Paroxysmal atrial fibrillation: Secondary | ICD-10-CM | POA: Diagnosis not present

## 2015-10-17 DIAGNOSIS — I10 Essential (primary) hypertension: Secondary | ICD-10-CM

## 2015-10-17 DIAGNOSIS — G4733 Obstructive sleep apnea (adult) (pediatric): Secondary | ICD-10-CM | POA: Diagnosis not present

## 2015-10-17 DIAGNOSIS — IMO0001 Reserved for inherently not codable concepts without codable children: Secondary | ICD-10-CM

## 2015-10-17 DIAGNOSIS — R079 Chest pain, unspecified: Secondary | ICD-10-CM | POA: Diagnosis not present

## 2015-10-17 DIAGNOSIS — Z0389 Encounter for observation for other suspected diseases and conditions ruled out: Secondary | ICD-10-CM

## 2015-10-17 LAB — CBC WITH DIFFERENTIAL/PLATELET
BASOS ABS: 0 {cells}/uL (ref 0–200)
BASOS PCT: 0 %
EOS PCT: 2 %
Eosinophils Absolute: 120 cells/uL (ref 15–500)
HCT: 40.5 % (ref 38.5–50.0)
HEMOGLOBIN: 13.5 g/dL (ref 13.2–17.1)
LYMPHS ABS: 1500 {cells}/uL (ref 850–3900)
Lymphocytes Relative: 25 %
MCH: 30 pg (ref 27.0–33.0)
MCHC: 33.3 g/dL (ref 32.0–36.0)
MCV: 90 fL (ref 80.0–100.0)
MPV: 10.1 fL (ref 7.5–12.5)
Monocytes Absolute: 480 cells/uL (ref 200–950)
Monocytes Relative: 8 %
NEUTROS ABS: 3900 {cells}/uL (ref 1500–7800)
Neutrophils Relative %: 65 %
PLATELETS: 161 10*3/uL (ref 140–400)
RBC: 4.5 MIL/uL (ref 4.20–5.80)
RDW: 13.2 % (ref 11.0–15.0)
WBC: 6 10*3/uL (ref 3.8–10.8)

## 2015-10-17 LAB — BASIC METABOLIC PANEL
BUN: 13 mg/dL (ref 7–25)
CALCIUM: 9.1 mg/dL (ref 8.6–10.3)
CHLORIDE: 105 mmol/L (ref 98–110)
CO2: 27 mmol/L (ref 20–31)
Creat: 1 mg/dL (ref 0.70–1.18)
Glucose, Bld: 102 mg/dL — ABNORMAL HIGH (ref 65–99)
Potassium: 4.4 mmol/L (ref 3.5–5.3)
SODIUM: 142 mmol/L (ref 135–146)

## 2015-10-17 LAB — PROTIME-INR
INR: 1.1
PROTHROMBIN TIME: 11.6 s — AB (ref 9.0–11.5)

## 2015-10-17 NOTE — Addendum Note (Signed)
Addended by: Isaiah Serge on: 10/17/2015 05:28 PM   Modules accepted: Orders, SmartSet

## 2015-10-17 NOTE — Patient Instructions (Signed)
Medication Instructions: Your physician recommends that you continue on your current medications as directed. Please refer to the Current Medication list given to you today.   Labwork: TODAY : BMET, CBC, PT/INR  Procedures/Testing: Your physician has requested that you have a cardiac catheterization. Cardiac catheterization is used to diagnose and/or treat various heart conditions. Doctors may recommend this procedure for a number of different reasons. The most common reason is to evaluate chest pain. Chest pain can be a symptom of coronary artery disease (CAD), and cardiac catheterization can show whether plaque is narrowing or blocking your heart's arteries. This procedure is also used to evaluate the valves, as well as measure the blood flow and oxygen levels in different parts of your heart. For further information please visit [email or web address]. Please follow instruction sheet, as given.    Follow-Up: Your physician recommends that you schedule a follow-up appointment will be set up at discharge.   Any Additional Special Instructions Will Be Listed Below (If Applicable).   Coronary Angiogram A coronary angiogram, also called coronary angiography, is an X-ray procedure used to look at the arteries in the heart. In this procedure, a dye (contrast dye) is injected through a long, hollow tube (catheter). The catheter is about the size of a piece of cooked spaghetti and is inserted through your groin, wrist, or arm. The dye is injected into each artery, and X-rays are then taken to show if there is a blockage in the arteries of your heart. LET [facility] CARE PROVIDER KNOW ABOUT:  Any allergies you have, including allergies to shellfish or contrast dye.   All medicines you are taking, including vitamins, herbs, eye drops, creams, and over-the-counter medicines.   Previous problems you or members of your family have had with the use of anesthetics.   Any blood disorders you have.    Previous surgeries you have had.  History of kidney problems or failure.   Other medical conditions you have. RISKS AND COMPLICATIONS  Generally, a coronary angiogram is a safe procedure. However, problems can occur and include:  Allergic reaction to the dye.  Bleeding from the access site or other locations.  Kidney injury, especially in people with impaired kidney function.  Stroke (rare).  Heart attack (rare). BEFORE THE PROCEDURE   Do not eat or drink anything after midnight the night before the procedure or as directed by your health care provider.   Ask your health care provider about changing or stopping your regular medicines. This is especially important if you are taking diabetes medicines or blood thinners. PROCEDURE  You may be given a medicine to help you relax (sedative) before the procedure. This medicine is given through an intravenous (IV) access tube that is inserted into one of your veins.   The area where the catheter will be inserted will be washed and shaved. This is usually done in the groin but may be done in the fold of your arm (near your elbow) or in the wrist.   A medicine will be given to numb the area where the catheter will be inserted (local anesthetic).   The health care provider will insert the catheter into an artery. The catheter will be guided by using a special type of X-ray (fluoroscopy) of the blood vessel being examined.   A special dye will then be injected into the catheter, and X-rays will be taken. The dye will help to show where any narrowing or blockages are located in the heart arteries.  AFTER THE PROCEDURE  If the procedure is done through the leg, you will be kept in bed lying flat for several hours. You will be instructed to not bend or cross your legs.  The insertion site will be checked frequently.   The pulse in your feet or wrist will be checked frequently.   Additional blood tests, X-rays, and an  electrocardiogram may be done.    This information is not intended to replace advice given to you by your health care provider. Make sure you discuss any questions you have with your health care provider.   Document Released: 08/31/2002 Document Revised: 03/17/2014 Document Reviewed: 07/19/2012 Elsevier Interactive Patient Education [employer].      If you need a refill on your cardiac medications before your next appointment, please call your pharmacy.

## 2015-10-19 ENCOUNTER — Telehealth: Payer: Self-pay

## 2015-10-19 NOTE — Telephone Encounter (Signed)
10/17/2015 Pt here for appointment to see Cecilie Kicks.  Pt and wife verbalized inability to pay for Eliquis since they are in the "[facility]  Provided patient 1 month supply of Eliquis 5 mg samples. Georgana Curio MHA RN CCM

## 2015-10-22 ENCOUNTER — Ambulatory Visit (HOSPITAL_COMMUNITY)
Admission: RE | Admit: 2015-10-22 | Discharge: 2015-10-22 | Disposition: A | Discharge status: Home / Self Care | Payer: Medicare Other | Source: Ambulatory Visit | Attending: Cardiovascular Disease | Admitting: Cardiovascular Disease

## 2015-10-22 ENCOUNTER — Encounter (HOSPITAL_COMMUNITY)
Admission: RE | Disposition: A | Discharge status: Home / Self Care | Payer: Self-pay | Source: Ambulatory Visit | Attending: Cardiovascular Disease

## 2015-10-22 ENCOUNTER — Encounter (HOSPITAL_COMMUNITY): Payer: Self-pay | Admitting: Internal Medicine

## 2015-10-22 DIAGNOSIS — K219 Gastro-esophageal reflux disease without esophagitis: Secondary | ICD-10-CM | POA: Diagnosis not present

## 2015-10-22 DIAGNOSIS — Z803 Family history of malignant neoplasm of breast: Secondary | ICD-10-CM | POA: Insufficient documentation

## 2015-10-22 DIAGNOSIS — K449 Diaphragmatic hernia without obstruction or gangrene: Secondary | ICD-10-CM | POA: Insufficient documentation

## 2015-10-22 DIAGNOSIS — J45909 Unspecified asthma, uncomplicated: Secondary | ICD-10-CM | POA: Insufficient documentation

## 2015-10-22 DIAGNOSIS — M1990 Unspecified osteoarthritis, unspecified site: Secondary | ICD-10-CM | POA: Insufficient documentation

## 2015-10-22 DIAGNOSIS — Z86010 Personal history of colonic polyps: Secondary | ICD-10-CM | POA: Diagnosis not present

## 2015-10-22 DIAGNOSIS — I480 Paroxysmal atrial fibrillation: Secondary | ICD-10-CM | POA: Diagnosis not present

## 2015-10-22 DIAGNOSIS — Z6839 Body mass index (BMI) 39.0-39.9, adult: Secondary | ICD-10-CM | POA: Diagnosis not present

## 2015-10-22 DIAGNOSIS — E669 Obesity, unspecified: Secondary | ICD-10-CM | POA: Diagnosis not present

## 2015-10-22 DIAGNOSIS — Z7951 Long term (current) use of inhaled steroids: Secondary | ICD-10-CM | POA: Insufficient documentation

## 2015-10-22 DIAGNOSIS — Z8546 Personal history of malignant neoplasm of prostate: Secondary | ICD-10-CM | POA: Diagnosis not present

## 2015-10-22 DIAGNOSIS — Z96651 Presence of right artificial knee joint: Secondary | ICD-10-CM | POA: Diagnosis not present

## 2015-10-22 DIAGNOSIS — I2510 Atherosclerotic heart disease of native coronary artery without angina pectoris: Secondary | ICD-10-CM | POA: Diagnosis not present

## 2015-10-22 DIAGNOSIS — G4733 Obstructive sleep apnea (adult) (pediatric): Secondary | ICD-10-CM | POA: Insufficient documentation

## 2015-10-22 DIAGNOSIS — R079 Chest pain, unspecified: Secondary | ICD-10-CM | POA: Diagnosis present

## 2015-10-22 DIAGNOSIS — Z87891 Personal history of nicotine dependence: Secondary | ICD-10-CM | POA: Diagnosis not present

## 2015-10-22 DIAGNOSIS — Z7901 Long term (current) use of anticoagulants: Secondary | ICD-10-CM | POA: Insufficient documentation

## 2015-10-22 DIAGNOSIS — Z8249 Family history of ischemic heart disease and other diseases of the circulatory system: Secondary | ICD-10-CM | POA: Diagnosis not present

## 2015-10-22 DIAGNOSIS — Z85820 Personal history of malignant melanoma of skin: Secondary | ICD-10-CM | POA: Insufficient documentation

## 2015-10-22 DIAGNOSIS — I4510 Unspecified right bundle-branch block: Secondary | ICD-10-CM | POA: Insufficient documentation

## 2015-10-22 DIAGNOSIS — I129 Hypertensive chronic kidney disease with stage 1 through stage 4 chronic kidney disease, or unspecified chronic kidney disease: Secondary | ICD-10-CM | POA: Insufficient documentation

## 2015-10-22 DIAGNOSIS — N189 Chronic kidney disease, unspecified: Secondary | ICD-10-CM | POA: Diagnosis not present

## 2015-10-22 HISTORY — PX: CARDIAC CATHETERIZATION: SHX172

## 2015-10-22 SURGERY — LEFT HEART CATH AND CORONARY ANGIOGRAPHY

## 2015-10-22 MED ORDER — ASPIRIN 81 MG PO CHEW
81.0000 mg | CHEWABLE_TABLET | ORAL | Status: AC
Start: 2015-10-22 — End: 2015-10-22
  Administered 2015-10-22: 81 mg via ORAL

## 2015-10-22 MED ORDER — ASPIRIN 81 MG PO CHEW
CHEWABLE_TABLET | ORAL | Status: AC
  Filled 2015-10-22: qty 1

## 2015-10-22 MED ORDER — HEPARIN (PORCINE) IN NACL 2-0.9 UNIT/ML-% IJ SOLN
INTRAMUSCULAR | Status: AC
  Filled 2015-10-22: qty 500

## 2015-10-22 MED ORDER — FENTANYL CITRATE (PF) 100 MCG/2ML IJ SOLN
INTRAMUSCULAR | Status: DC | PRN
  Administered 2015-10-22: 50 ug via INTRAVENOUS

## 2015-10-22 MED ORDER — SODIUM CHLORIDE 0.9 % IV SOLN: 250.0000 mL | INTRAVENOUS | Status: DC | PRN

## 2015-10-22 MED ORDER — VERAPAMIL HCL 2.5 MG/ML IV SOLN
INTRAVENOUS | Status: DC | PRN
  Administered 2015-10-22: 8 mL via INTRA_ARTERIAL

## 2015-10-22 MED ORDER — HEPARIN (PORCINE) IN NACL 2-0.9 UNIT/ML-% IJ SOLN
INTRAMUSCULAR | Status: AC
  Filled 2015-10-22: qty 1000

## 2015-10-22 MED ORDER — SODIUM CHLORIDE 0.9 % IV SOLN: INTRAVENOUS | Status: DC

## 2015-10-22 MED ORDER — MIDAZOLAM HCL 2 MG/2ML IJ SOLN
INTRAMUSCULAR | Status: AC
  Filled 2015-10-22: qty 2

## 2015-10-22 MED ORDER — HEPARIN SODIUM (PORCINE) 1000 UNIT/ML IJ SOLN
INTRAMUSCULAR | Status: AC
  Filled 2015-10-22: qty 1

## 2015-10-22 MED ORDER — LIDOCAINE HCL (PF) 1 % IJ SOLN
INTRAMUSCULAR | Status: DC | PRN
  Administered 2015-10-22: 2 mL

## 2015-10-22 MED ORDER — IOPAMIDOL (ISOVUE-370) INJECTION 76%
INTRAVENOUS | Status: AC
  Filled 2015-10-22: qty 100

## 2015-10-22 MED ORDER — IOPAMIDOL (ISOVUE-370) INJECTION 76%
INTRAVENOUS | Status: DC | PRN
  Administered 2015-10-22: 90 mL via INTRA_ARTERIAL

## 2015-10-22 MED ORDER — FENTANYL CITRATE (PF) 100 MCG/2ML IJ SOLN
INTRAMUSCULAR | Status: AC
  Filled 2015-10-22: qty 2

## 2015-10-22 MED ORDER — SODIUM CHLORIDE 0.9% FLUSH: 3.0000 mL | Freq: Two times a day (BID) | INTRAVENOUS | Status: DC

## 2015-10-22 MED ORDER — SODIUM CHLORIDE 0.9% FLUSH: 3.0000 mL | INTRAVENOUS | Status: DC | PRN

## 2015-10-22 MED ORDER — SODIUM CHLORIDE 0.9 % WEIGHT BASED INFUSION
3.0000 mL/kg/h | INTRAVENOUS | Status: DC
  Administered 2015-10-22: 3 mL/kg/h via INTRAVENOUS

## 2015-10-22 MED ORDER — APIXABAN 5 MG PO TABS: 5.0000 mg | ORAL_TABLET | Freq: Two times a day (BID) | ORAL | 3 refills | Status: DC

## 2015-10-22 MED ORDER — HEPARIN SODIUM (PORCINE) 1000 UNIT/ML IJ SOLN
INTRAMUSCULAR | Status: DC | PRN
  Administered 2015-10-22: 5000 [IU] via INTRAVENOUS

## 2015-10-22 MED ORDER — MIDAZOLAM HCL 2 MG/2ML IJ SOLN
INTRAMUSCULAR | Status: DC | PRN
  Administered 2015-10-22: 1 mg via INTRAVENOUS

## 2015-10-22 MED ORDER — SODIUM CHLORIDE 0.9 % WEIGHT BASED INFUSION: 1.0000 mL/kg/h | INTRAVENOUS | Status: DC

## 2015-10-22 MED ORDER — LIDOCAINE HCL (PF) 1 % IJ SOLN
INTRAMUSCULAR | Status: AC
  Filled 2015-10-22: qty 30

## 2015-10-22 MED ORDER — VERAPAMIL HCL 2.5 MG/ML IV SOLN
INTRAVENOUS | Status: AC
  Filled 2015-10-22: qty 2

## 2015-10-22 MED ORDER — HEPARIN (PORCINE) IN NACL 2-0.9 UNIT/ML-% IJ SOLN
INTRAMUSCULAR | Status: DC | PRN
  Administered 2015-10-22: 1500 mL

## 2015-10-22 SURGICAL SUPPLY — 13 items
CATH INFINITI 5 FR JL3.5 (CATHETERS) ×2 IMPLANT
CATH INFINITI 5FR ANG PIGTAIL (CATHETERS) ×2 IMPLANT
CATH INFINITI JR4 5F (CATHETERS) ×2 IMPLANT
DEVICE RAD COMP TR BAND LRG (VASCULAR PRODUCTS) ×2 IMPLANT
GLIDESHEATH SLEND A-KIT 6F 22G (SHEATH) ×2 IMPLANT
GLIDESHEATH SLEND SS 6F .021 (SHEATH) ×2 IMPLANT
KIT HEART LEFT (KITS) ×2 IMPLANT
PACK CARDIAC CATHETERIZATION (CUSTOM PROCEDURE TRAY) ×2 IMPLANT
SYR MEDRAD MARK V 150ML (SYRINGE) ×2 IMPLANT
TRANSDUCER W/STOPCOCK (MISCELLANEOUS) ×2 IMPLANT
TUBING CIL FLEX 10 FLL-RA (TUBING) ×2 IMPLANT
WIRE HI TORQ VERSACORE-J 145CM (WIRE) ×2 IMPLANT
WIRE SAFE-T 1.5MM-J .035X260CM (WIRE) ×2 IMPLANT

## 2015-10-22 NOTE — Discharge Instructions (Signed)
Radial Site Care °Refer to this sheet in the next few weeks. These instructions provide you with information about caring for yourself after your procedure. Your health care provider may also give you more specific instructions. Your treatment has been planned according to current medical practices, but problems sometimes occur. Call your health care provider if you have any problems or questions after your procedure. °WHAT TO EXPECT AFTER THE PROCEDURE °After your procedure, it is typical to have the following: °· Bruising at the radial site that usually fades within 1-2 weeks. °· Blood collecting in the tissue (hematoma) that may be painful to the touch. It should usually decrease in size and tenderness within 1-2 weeks. °HOME CARE INSTRUCTIONS °· Take medicines only as directed by your health care provider. °· You may shower 24-48 hours after the procedure or as directed by your health care provider. Remove the bandage (dressing) and gently wash the site with plain soap and water. Pat the area dry with a clean towel. Do not rub the site, because this may cause bleeding. °· Do not take baths, swim, or use a hot tub until your health care provider approves. °· Check your insertion site every day for redness, swelling, or drainage. °· Do not apply powder or lotion to the site. °· Do not flex or bend the affected arm for 24 hours or as directed by your health care provider. °· Do not push or pull heavy objects with the affected arm for 24 hours or as directed by your health care provider. °· Do not lift over 10 lb (4.5 kg) for 5 days after your procedure or as directed by your health care provider. °· Ask your health care provider when it is okay to: °¨ Return to work or school. °¨ Resume usual physical activities or sports. °¨ Resume sexual activity. °· Do not drive home if you are discharged the same day as the procedure. Have someone else drive you. °· You may drive 24 hours after the procedure unless otherwise  instructed by your health care provider. °· Do not operate machinery or power tools for 24 hours after the procedure. °· If your procedure was done as an outpatient procedure, which means that you went home the same day as your procedure, a responsible adult should be with you for the first 24 hours after you arrive home. °· Keep all follow-up visits as directed by your health care provider. This is important. °SEEK MEDICAL CARE IF: °· You have a fever. °· You have chills. °· You have increased bleeding from the radial site. Hold pressure on the site. °SEEK IMMEDIATE MEDICAL CARE IF: °· You have unusual pain at the radial site. °· You have redness, warmth, or swelling at the radial site. °· You have drainage (other than a small amount of blood on the dressing) from the radial site. °· The radial site is bleeding, and the bleeding does not stop after 30 minutes of holding steady pressure on the site. °· Your arm or hand becomes pale, cool, tingly, or numb. °  °This information is not intended to replace advice given to you by your health care provider. Make sure you discuss any questions you have with your health care provider. °  °Document Released: 03/29/2010 Document Revised: 03/17/2014 Document Reviewed: 09/12/2013 °Elsevier Interactive Patient Education ©2016 Elsevier Inc. ° °

## 2015-10-22 NOTE — Research (Signed)
LEADERS FREE II Informed Consent   Subject Name: [name] [name]  Subject met inclusion and exclusion criteria.  The informed consent form, study requirements and expectations were reviewed with the subject and questions and concerns were addressed prior to the signing of the consent form.  The subject verbalized understanding of the trail requirements.  The subject agreed to participate in the LEADERS FREE II trial and signed the informed consent.  The informed consent was obtained prior to performance of any protocol-specific procedures for the subject.  A copy of the signed informed consent was given to the subject and a copy was placed in the subject's medical record.  Hedrick,Lilliann Rossetti W 10/22/2015, 10:17 AM

## 2015-10-22 NOTE — Interval H&P Note (Signed)
History and Physical Interval Note:  10/22/2015 10:13 AM  [name] [name]  has presented today for cardiac catheterization, with the diagnosis of chest pain.  The various methods of treatment have been discussed with the patient and family. After consideration of risks, benefits and other options for treatment, the patient has consented to  Procedure(s): Left Heart Cath and Coronary Angiography (N/A) as a surgical intervention .  The patient's history has been reviewed, patient examined, no change in status, stable for surgery.  I have reviewed the patient's chart and labs.  Questions were answered to the patient's satisfaction.    Cath Lab Visit (complete for each Cath Lab visit)  Clinical Evaluation Leading to the Procedure:   ACS: No.  Non-ACS:    Anginal Classification: CCS III  Anti-ischemic medical therapy: Minimal Therapy (1 class of medications)  Non-Invasive Test Results: Low-risk stress test findings: cardiac mortality <1%/year  Prior CABG: No previous CABG     [name] [name]

## 2015-10-22 NOTE — H&P (View-Only) (Signed)
Cardiology Office Note   Date:  10/17/2015   ID:  [name] [name] 24-Apr-1940, MRN [id number]  PCP:  Simona Huh, MD  Cardiologist:  Dr. Tamala Julian    Chief Complaint  Patient presents with  . Chest Pain    episodes continue      History of Present Illness: [name] [name] is a 75 y.o. male who presents for post ER visit for chest pain.  Troponin was neg and BNP elevated at 193 but no HF on CXR.  CBC and BMP normal.   EKG SR with incomplete RBBB no acute changes.  He has a hx. of atrial fibrillation, obstructive sleep apnea, and recurrent chest pain without significant CAD.  Paroxysmal atrial fibrillation is been the genesis of the patient's discomfort. He is now on flecainide and has had no recurrences. He is on Eliquis without bleeding complications. We did a sleep study and it demonstrates moderate sleep apnea. He is reluctant to have the C Pap titration because he doesn't feel he will tolerate the mask and there are financial constraints.  Last Echo 01/2015 EF 60-65% mild LVH, MV with mild regurgitation.  LA mildly dilated Last cardiac cath 09/08/11 with no significant CAD, RCA with diffuse irregularities prx, mid and distal.  Previous cath 2010 with normal cors.    Nuc in Jan with no reversible ischemia, apical and lateral wall infarct. EF 55% He had presented with chest pain and A fib.    Today he has not had any more of the chest pressure but he has had sharp shooting pains in lt side.  He has 2 brief episodes of seconds on rapid heart beat but none with severe pain that took him to ER.  Pt concerned that he will have a heart attack or stroke.  Reassured about a stroke from a fib on Eliquis.    Chronic lower ext edema, discussed support stockings and decreasing salt.    Past Medical History:  Diagnosis Date  . Angina    chest pain- cardiac cath. followed in 2010, record available ,  told then that he should f/u /w Dr. Marisue Humble    . Arthritis    R knee, back    .  Asthma    uses  singulair  . Cancer [facility])    prostate, melanoma- 2010, excision    . Chronic kidney disease    prostate cancer - surg. removal- 1996  . Colon polyps   . Diverticulitis   . Dysrhythmia    palpitations, followed by Dr.Ehinger, seen in prep for surgery on 03/27/2011   . GERD (gastroesophageal reflux disease)   . Hepatitis    jaundice- many yrs. ago  . Hiatal hernia   . Hypertension   . 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 OSA (obstructive sleep apnea) 04/11/2015   Mild to moderate OSA with AHI 13.3/hr overall and AHI 36.8/hr during REM sleep.  Oxygen saturations were as low as 86% during respiratory events.  . Pneumonia    hosp. 20 yrs. ago  . Prostate cancer ([place]) 1995  . Sleep apnea    study done, 10 yrs. ago, told that he needed  CPAP but  never used     Past Surgical History:  Procedure Laterality Date  . CARDIAC CATHETERIZATION     2010  . FRACTURE SURGERY     L wrist, hardware- 1982  . JOINT REPLACEMENT     L knee, 2009  . LEFT HEART CATHETERIZATION WITH CORONARY ANGIOGRAM  N/A 09/08/2011   Procedure: LEFT HEART CATHETERIZATION WITH CORONARY ANGIOGRAM;  Surgeon: Sinclair Grooms, MD;  Location: [facility] CATH LAB;  Service: Cardiovascular;  Laterality: N/A;  . PROSTATECTOMY     for ca  . TONSILLECTOMY     as child  . TOTAL KNEE ARTHROPLASTY  04/22/2011   Procedure: TOTAL KNEE ARTHROPLASTY;  Surgeon: Garald Balding, MD;  Location: [place];  Service: Orthopedics;  Laterality: Right;     Current Outpatient Prescriptions  Medication Sig Dispense Refill  . acetaminophen (TYLENOL) 325 MG tablet Take 2 tablets (650 mg total) by mouth every 4 (four) hours as needed for headache or mild pain.    Marland Kitchen albuterol (PROVENTIL HFA;VENTOLIN HFA) 108 (90 BASE) MCG/ACT inhaler Inhale 2 puffs into the lungs every 6 (six) hours as needed for wheezing or shortness of breath.    Marland Kitchen apixaban (ELIQUIS) 5 MG TABS tablet Take 1 tablet (5 mg total) by mouth 2 (two) times daily. 180 tablet 3  .  budesonide-formoterol (SYMBICORT) 80-4.5 MCG/ACT inhaler Inhale 2 puffs into the lungs 2 (two) times daily. 3 Inhaler 1  . Calcium Carbonate-Vitamin D (CALCIUM + D) 600-200 MG-UNIT TABS Take 1 tablet by mouth 2 (two) times daily.    Marland Kitchen diltiazem (CARDIZEM CD) 120 MG 24 hr capsule Take 1 capsule by mouth  daily 90 capsule 3  . flecainide (TAMBOCOR) 50 MG tablet Take 1 tablet (50 mg total) by mouth 2 (two) times daily. 28 tablet 0  . fluticasone (FLONASE) 50 MCG/ACT nasal spray Place 2 sprays into the nose at bedtime as needed for allergies.     . hydroxypropyl methylcellulose (ISOPTO TEARS) 2.5 % ophthalmic solution Place 1 drop into both eyes 3 (three) times daily as needed. For dry eyes    . montelukast (SINGULAIR) 10 MG tablet Take 10 mg by mouth daily before breakfast.     . Multiple Vitamins tablet Take 1 tablet by mouth daily.    . nitroGLYCERIN (NITROSTAT) 0.4 MG SL tablet Place 1 tablet (0.4 mg total) under the tongue every 5 (five) minutes x 3 doses as needed for chest pain. 25 tablet 3  . Omega-3 Fatty Acids (FISH OIL) 1000 MG CAPS Take 1,000 mg by mouth 2 (two) times daily.    Marland Kitchen omeprazole (PRILOSEC) 40 MG capsule Take 40 mg by mouth at bedtime.     Marland Kitchen Spacer/Aero-Holding Chambers (AEROCHAMBER MV) inhaler Use as instructed 1 each 0  . telmisartan (MICARDIS) 80 MG tablet Take 0.5 tablets (40 mg total) by mouth daily. 30 tablet 0   No current facility-administered medications for this visit.     Allergies:   Carvedilol; Lisinopril; Tenormin [atenolol]; and Zolpidem tartrate    Social History:  The patient  reports that he quit smoking about 32 years ago. His smoking use included Cigarettes. He has a 45.00 pack-year smoking history. He has never used smokeless tobacco. He reports that he does not drink alcohol or use drugs.   Family History:  The patient's family history includes Alzheimer's disease in his father; Breast cancer in his mother; Crohn's disease in his brother; Heart disease  in his brother; Hypertension in his brother.    ROS:  General:no colds or fevers, no weight changes Skin:no rashes or ulcers HEENT:no blurred vision, no congestion CV:see HPI PUL:see HPI GI:no diarrhea constipation or melena, no indigestion GU:no hematuria, no dysuria MS:no joint pain, no claudication Neuro:no syncope, no lightheadedness Endo:no diabetes, no thyroid disease Wt Readings from Last 3 Encounters:  10/17/15 255 lb 1.9 oz (115.7 kg)  08/27/15 253 lb (114.8 kg)  08/20/15 251 lb 12.8 oz (114.2 kg)     PHYSICAL EXAM: VS:  BP 130/76   Pulse (!) 56   Ht '5\' 7"'$  (1.702 m)   Wt 255 lb 1.9 oz (115.7 kg)   BMI 39.96 kg/m  , BMI Body mass index is 39.96 kg/m. General:Pleasant affect, NAD Skin:Warm and dry, brisk capillary refill HEENT:normocephalic, sclera clear, mucus membranes moist Neck:supple, no JVD, no bruits  Heart:S1S2 RRR without murmur, gallup, rub or click Lungs:clear without rales, rhonchi, or wheezes YTK:ZSWF, non tender, + BS, do not palpate liver spleen or masses Ext:no lower ext edema, 2+ pedal pulses, 2+ radial pulses Neuro:alert and oriented, MAE, follows commands, + facial symmetry    EKG:  EKG is ordered today. The ekg ordered today demonstrates  SB at 90 and incomplete RBBB no acute changes.     Recent Labs: 01/26/2015: ALT 11; TSH 2.270 03/17/2015: Magnesium 2.0 08/20/2015: Pro B Natriuretic peptide (BNP) 65.0 09/29/2015: BUN 10; Creatinine, Ser 1.19; Hemoglobin 13.8; Platelets 159; Potassium 3.9; Sodium 142 09/30/2015: B Natriuretic Peptide 193.6    Lipid Panel No results found for: CHOL, TRIG, HDL, CHOLHDL, VLDL, LDLCALC, LDLDIRECT     Other studies Reviewed: Additional studies/ records that were reviewed today include: previous caths and nuc studies see above.   ASSESSMENT AND PLAN:  1. Chest pain episode seen in ER 8/10 pt very concerned.  Discussed with DOD Dr. Johnsie Cancel and we will plan cardiac cath.  Pt on Eliquis and needs to miss 3  doses so cath will be for Monday the 14th.  Pt has new script for sl NTG with plan to take for recurrent chest pain and if pain continues and is severe to go to ER.    The patient understands that risks included but are not limited to stroke (1 in 1000), death (1 in 56), kidney failure [usually temporary] (1 in 500), bleeding (1 in 200), allergic reaction [possibly serious] (1 in 200).    2. PAF (paroxysmal atrial fibrillation) (HCC) rare episode   3.Hypertension controlled continue meds.   4.  Asthma  5.  Dyspnea with activity chronic  6.  Normal coronary arteries 2013--nuc in 2017 was stable.   7.  Chronic anticoagulation no bleeding has not missed Eliquis.   8.  History of prostate cancer  9.  Obesity, Class II, BMI 35-39.9, with comorbidity ([place])   Current medicines are reviewed with the patient today.  The patient Has no concerns regarding medicines.  The following changes have been made:  See above Labs/ tests ordered today include:see above  Disposition:   FU:  see above  Signed, Cecilie Kicks, NP  10/17/2015 10:20 AM    [address], [place] [place], [place] Phone: [phone]; Fax: [phone]

## 2015-10-30 ENCOUNTER — Ambulatory Visit (INDEPENDENT_AMBULATORY_CARE_PROVIDER_SITE_OTHER): Payer: Medicare Other | Admitting: Interventional Cardiology

## 2015-10-30 ENCOUNTER — Encounter (INDEPENDENT_AMBULATORY_CARE_PROVIDER_SITE_OTHER): Payer: Self-pay

## 2015-10-30 ENCOUNTER — Encounter: Payer: Self-pay | Admitting: Interventional Cardiology

## 2015-10-30 VITALS — BP 136/74 | HR 67 | Ht 67.0 in | Wt 250.4 lb

## 2015-10-30 DIAGNOSIS — I10 Essential (primary) hypertension: Secondary | ICD-10-CM

## 2015-10-30 DIAGNOSIS — I480 Paroxysmal atrial fibrillation: Secondary | ICD-10-CM

## 2015-10-30 DIAGNOSIS — F419 Anxiety disorder, unspecified: Secondary | ICD-10-CM | POA: Insufficient documentation

## 2015-10-30 DIAGNOSIS — R0600 Dyspnea, unspecified: Secondary | ICD-10-CM

## 2015-10-30 DIAGNOSIS — R079 Chest pain, unspecified: Secondary | ICD-10-CM | POA: Diagnosis not present

## 2015-10-30 DIAGNOSIS — Z7901 Long term (current) use of anticoagulants: Secondary | ICD-10-CM

## 2015-10-30 DIAGNOSIS — G4733 Obstructive sleep apnea (adult) (pediatric): Secondary | ICD-10-CM

## 2015-10-30 MED ORDER — LORAZEPAM 0.5 MG PO TABS
ORAL_TABLET | ORAL | 0 refills | Status: DC
Start: 2015-10-30 — End: 2016-04-23

## 2015-10-30 NOTE — Patient Instructions (Addendum)
Medication Instructions:  Your physician has recommended you make the following change in your medication:  1.  START Lorazepam 0.5 mg taking 1/2 - 1 tablet twice a day ONLY AS NEEDED (follow-up with your PCP for further refills)   Labwork: None ordered  Testing/Procedures: None ordered  Follow-Up: Your physician wants you to follow-up in: [place] [address] [facility]  You will receive a reminder letter in the mail two months in advance. If you don't receive a letter, please call our office to schedule the follow-up appointment.  Any Other Special Instructions Will Be Listed Below (If Applicable).     If you need a refill on your cardiac medications before your next appointment, please call your pharmacy.

## 2015-10-30 NOTE — Progress Notes (Signed)
Cardiology Office Note    Date:  10/30/2015   ID:  [name], [name] 07-22-1940, MRN [id number]  PCP:  Simona Huh, MD  Cardiologist: Sinclair Grooms, MD   Chief Complaint  Patient presents with  . Atrial Fibrillation    History of Present Illness:  [name] [name] is a 75 y.o. male with a history of paroxysmal atrial fibrillation, recurring chest discomfort, hypertension, and chronic kidney disease.  Diagnosed with atrial fibrillation by EKG greater than a year ago. Flecainide and diltiazem were started. He is also on chronic anticoagulation therapy. He has been stable on these medications. He is currently under great stress at home related to a son who is moved in with them. He is also concerned about what happened to his wife should he die. Recent symptoms of feeling aching in his arm and chest discomfort led to coronary angiography which did not reveal any significant CAD.  Past Medical History:  Diagnosis Date  . Angina    chest pain- cardiac cath. followed in 2010, record available ,  told then that he should f/u /w Dr. Marisue Humble    . Arthritis    R knee, back    . Asthma    uses  singulair  . Cancer [facility])    prostate, melanoma- 2010, excision    . Chronic kidney disease    prostate cancer - surg. removal- 1996  . Colon polyps   . Diverticulitis   . Dysrhythmia    palpitations, followed by Dr.Ehinger, seen in prep for surgery on 03/27/2011   . GERD (gastroesophageal reflux disease)   . Hepatitis    jaundice- many yrs. ago  . Hiatal hernia   . Hypertension   . Melanoma ([place])    Face  . OSA (obstructive sleep apnea) 04/11/2015   Mild to moderate OSA with AHI 13.3/hr overall and AHI 36.8/hr during REM sleep.  Oxygen saturations were as low as 86% during respiratory events.  . Pneumonia    hosp. 20 yrs. ago  . Prostate cancer ([place]) 1995  . Sleep apnea    study done, 10 yrs. ago, told that he needed  CPAP but  never used     Past Surgical History:    Procedure Laterality Date  . CARDIAC CATHETERIZATION     2010  . CARDIAC CATHETERIZATION N/A 10/22/2015   Procedure: Left Heart Cath and Coronary Angiography;  Surgeon: Nelva Bush, MD;  Location: [facility];  Service: Cardiovascular;  Laterality: N/A;  . FRACTURE SURGERY     L wrist, hardware- 1982  . JOINT REPLACEMENT     L knee, 2009  . LEFT HEART CATHETERIZATION WITH CORONARY ANGIOGRAM N/A 09/08/2011   Procedure: LEFT HEART CATHETERIZATION WITH CORONARY ANGIOGRAM;  Surgeon: Sinclair Grooms, MD;  Location: [facility] CATH LAB;  Service: Cardiovascular;  Laterality: N/A;  . PROSTATECTOMY     for ca  . TONSILLECTOMY     as child  . TOTAL KNEE ARTHROPLASTY  04/22/2011   Procedure: TOTAL KNEE ARTHROPLASTY;  Surgeon: Garald Balding, MD;  Location: [facility];  Service: Orthopedics;  Laterality: Right;    Current Medications: Outpatient Medications Prior to Visit  Medication Sig Dispense Refill  . acetaminophen (TYLENOL) 325 MG tablet Take 2 tablets (650 mg total) by mouth every 4 (four) hours as needed for headache or mild pain.    Marland Kitchen albuterol (PROVENTIL HFA;VENTOLIN HFA) 108 (90 BASE) MCG/ACT inhaler Inhale 2 puffs into the lungs every 6 (six)  hours as needed for wheezing or shortness of breath.    Marland Kitchen apixaban (ELIQUIS) 5 MG TABS tablet Take 1 tablet (5 mg total) by mouth 2 (two) times daily. 180 tablet 3  . budesonide-formoterol (SYMBICORT) 80-4.5 MCG/ACT inhaler Inhale 2 puffs into the lungs 2 (two) times daily. 3 Inhaler 1  . Calcium Carbonate-Vitamin D (CALCIUM + D) 600-200 MG-UNIT TABS Take 1 tablet by mouth 2 (two) times daily.    Marland Kitchen diltiazem (CARDIZEM CD) 120 MG 24 hr capsule Take 1 capsule by mouth  daily 90 capsule 3  . flecainide (TAMBOCOR) 50 MG tablet Take 1 tablet (50 mg total) by mouth 2 (two) times daily. 28 tablet 0  . fluticasone (FLONASE) 50 MCG/ACT nasal spray Place 2 sprays into the nose at bedtime as needed for allergies.     . hydroxypropyl methylcellulose (ISOPTO  TEARS) 2.5 % ophthalmic solution Place 1 drop into both eyes 2 (two) times daily as needed for dry eyes. For dry eyes    . montelukast (SINGULAIR) 10 MG tablet Take 10 mg by mouth daily before breakfast.     . Multiple Vitamins tablet Take 1 tablet by mouth daily.    . nitroGLYCERIN (NITROSTAT) 0.4 MG SL tablet Place 1 tablet (0.4 mg total) under the tongue every 5 (five) minutes x 3 doses as needed for chest pain. 25 tablet 3  . Omega-3 Fatty Acids (FISH OIL) 1000 MG CAPS Take 1,000 mg by mouth 2 (two) times daily.    Marland Kitchen omeprazole (PRILOSEC) 40 MG capsule Take 40 mg by mouth at bedtime.     Marland Kitchen telmisartan (MICARDIS) 80 MG tablet Take 0.5 tablets (40 mg total) by mouth daily. 30 tablet 0   No facility-administered medications prior to visit.      Allergies:   Aminophylline; Carvedilol; Lisinopril; Tenormin [atenolol]; and Zolpidem tartrate   Social History   Social History  . Marital status: Married    Spouse name: N/A  . Number of children: N/A  . Years of education: N/A   Social History Main Topics  . Smoking status: Former Smoker    Packs/day: 1.50    Years: 30.00    Types: Cigarettes    Quit date: 04/11/1983  . Smokeless tobacco: Never Used  . Alcohol use No  . Drug use: No  . Sexual activity: No   Other Topics Concern  . None   Social History Narrative  . None     Family History:  The patient's family history includes Alzheimer's disease in his father; Breast cancer in his mother; Crohn's disease in his brother; Heart disease in his brother; Hypertension in his brother.   ROS:   Please see the history of present illness.    Dizziness, headache, anxiety, irregular heartbeat, chest pain, arm pain.  All other systems reviewed and are negative.   PHYSICAL EXAM:   VS:  BP 136/74   Pulse 67   Ht '5\' 7"'$  (1.702 m)   Wt 250 lb 6.4 oz (113.6 kg)   BMI 39.22 kg/m    GEN: Well nourished, well developed, in no acute distress  HEENT: normal  Neck: no JVD, carotid bruits, or  masses Cardiac: RRR; no murmurs, rubs, or gallops,no edema  Respiratory:  clear to auscultation bilaterally, normal work of breathing GI: soft, nontender, nondistended, + BS MS: no deformity or atrophy  Skin: warm and dry, no rash Neuro:  Alert and Oriented x 3, Strength and sensation are intact Psych: euthymic mood, full affect  Wt  Readings from Last 3 Encounters:  10/30/15 250 lb 6.4 oz (113.6 kg)  10/22/15 250 lb (113.4 kg)  10/17/15 255 lb 1.9 oz (115.7 kg)      Studies/Labs Reviewed:   EKG:  EKG  Not repeated  Recent Labs: 01/26/2015: ALT 11; TSH 2.270 03/17/2015: Magnesium 2.0 08/20/2015: Pro B Natriuretic peptide (BNP) 65.0 09/30/2015: B Natriuretic Peptide 193.6 10/17/2015: BUN 13; Creat 1.00; Hemoglobin 13.5; Platelets 161; Potassium 4.4; Sodium 142   Lipid Panel No results found for: CHOL, TRIG, HDL, CHOLHDL, VLDL, LDLCALC, LDLDIRECT  Additional studies/ records that were reviewed today include:  Coronary angiogram 10/22/15: Diagnostic Diagram          ASSESSMENT:    1. PAF (paroxysmal atrial fibrillation) ([place])   2. Essential hypertension   3. Anxiety   4. Chest pain at rest   5. Dyspnea   6. Chronic anticoagulation   7. OSA (obstructive sleep apnea)      PLAN:  In order of problems listed above:  1. No recent recurrence. All flecainide for suppression. Symptoms of anxiety may be related to episodes of A. fib but on further questioning I believe that is a separate problem. 2. Well controlled 3. I believe this is due to external stress. Will try a limited supply of benzodiazepine therapy. Have given a prescription for lorazepam 0.5 mg one half to one tablet twice daily as needed for anxiety. 4. Normal coronary arteries.    Medication Adjustments/Labs and Tests Ordered: Current medicines are reviewed at length with the patient today.  Concerns regarding medicines are outlined above.  Medication changes, Labs and Tests ordered today are listed in the  Patient Instructions below. Patient Instructions  Medication Instructions:  Your physician recommends that you continue on your current medications as directed. Please refer to the Current Medication list given to you today.   Labwork: None ordered  Testing/Procedures: None ordered  Follow-Up: Your physician wants you to follow-up in: [place] DR. [facility]  You will receive a reminder letter in the mail two months in advance. If you don't receive a letter, please call our office to schedule the follow-up appointment.  Any Other Special Instructions Will Be Listed Below (If Applicable).     If you need a refill on your cardiac medications before your next appointment, please call your pharmacy.      Signed, Sinclair Grooms, MD  10/30/2015 2:05 PM    [facility] [address], [place], [place]  [address] Phone: [phone]; Fax: [phone]

## 2015-12-03 ENCOUNTER — Encounter: Payer: Self-pay | Admitting: Pulmonary Disease

## 2015-12-03 ENCOUNTER — Ambulatory Visit (INDEPENDENT_AMBULATORY_CARE_PROVIDER_SITE_OTHER): Payer: Medicare Other | Admitting: Pulmonary Disease

## 2015-12-03 DIAGNOSIS — R911 Solitary pulmonary nodule: Secondary | ICD-10-CM | POA: Diagnosis not present

## 2015-12-03 DIAGNOSIS — J441 Chronic obstructive pulmonary disease with (acute) exacerbation: Secondary | ICD-10-CM

## 2015-12-03 DIAGNOSIS — Z23 Encounter for immunization: Secondary | ICD-10-CM | POA: Diagnosis not present

## 2015-12-03 DIAGNOSIS — J45901 Unspecified asthma with (acute) exacerbation: Secondary | ICD-10-CM | POA: Diagnosis not present

## 2015-12-03 MED ORDER — BUDESONIDE-FORMOTEROL FUMARATE 160-4.5 MCG/ACT IN AERO: 2.0000 | INHALATION_SPRAY | Freq: Two times a day (BID) | RESPIRATORY_TRACT | 0 refills | Status: DC

## 2015-12-03 NOTE — Assessment & Plan Note (Signed)
This has been a stable interval for him despite his severe asthma.  He has not had an exacerbaiton since the last visit.  Plan Flu shot today Continue symbicort Continue prn albuterol F/u 6 months

## 2015-12-03 NOTE — Patient Instructions (Signed)
Take the Symbicort 2 puffs twice a day, if this sample works beter for you let us know because it is a higher dose than you take now  We will see you back in 6 months or sooner if needed

## 2015-12-03 NOTE — Assessment & Plan Note (Signed)
He will have a repeat CT this week at [facility]

## 2015-12-03 NOTE — Addendum Note (Signed)
Addended by: Virl Cagey on: 12/03/2015 12:46 PM   Modules accepted: Orders

## 2015-12-03 NOTE — Progress Notes (Signed)
Subjective:    Patient ID: [name] [name], male    DOB: [birth year], 75 y.o.   MRN: [id number]  Synopsis: Referred in 2016 for dyspnea due to asthma based on PFT showing reversible airflow obstruction. He quit smoking around 1975; he smoked for about 30-35 years 1.5 ppd.  November 2016 echocardiogram LVEF 60-65%, moderate to severe left atrial enlargement, normal RV size and function November 2016 chest x-ray mild atelectasis left base, likely chronic otherwise normal, cardiomegaly noted May 2015 PFT> pre-albuterol ratio 65%, FEV1 2.26 L (83% predicted), post albuterol FEV1 2.65 L (17% predicted, 400 mL change), total lung capacity 6.40 L (99% predicted) disease, DLCO 28.25 (99% pred)  Childhood polio, was in an iron lung  HPI Chief Complaint  Patient presents with  . Follow-up    Pt reports breathing is fine. Occas wheezing, prod cough (clear phlem).    Conroy has been going through some trouble at home, but no recent change in his symptoms. Mr. Luviano notes coughing up clear phlegm at home after he uses his symbicort.  No bronchitis flares since the last visit.  All things considered it has been stable. He has not had a flu shot yet, but he would like to get one today.   No sinus congestion problems recently.     Past Medical History:  Diagnosis Date  . Angina    chest pain- cardiac cath. followed in 2010, record available ,  told then that he should f/u /w Dr. Marisue Humble    . Arthritis    R knee, back    . Asthma    uses  singulair  . Cancer [facility])    prostate, melanoma- 2010, excision    . Chronic kidney disease    prostate cancer - surg. removal- 1996  . Colon polyps   . Diverticulitis   . Dysrhythmia    palpitations, followed by Dr.Ehinger, seen in prep for surgery on 03/27/2011   . GERD (gastroesophageal reflux disease)   . Hepatitis    jaundice- many yrs. ago  . Hiatal hernia   . Hypertension   . Melanoma ([name])    Face  . OSA (obstructive sleep apnea)  04/11/2015   Mild to moderate OSA with AHI 13.3/hr overall and AHI 36.8/hr during REM sleep.  Oxygen saturations were as low as 86% during respiratory events.  . Pneumonia    hosp. 20 yrs. ago  . Prostate cancer ([place]) 1995  . Sleep apnea    study done, 10 yrs. ago, told that he needed  CPAP but  never used       Review of Systems  Constitutional: Negative for chills, fatigue and fever.  HENT: Negative for nosebleeds, postnasal drip and rhinorrhea.   Respiratory: Negative for cough, shortness of breath and wheezing.   Cardiovascular: Negative for chest pain, palpitations and leg swelling.       Objective:   Physical Exam Vitals:   12/03/15 1159  BP: 134/62  Pulse: 60  SpO2: 95%  Weight: 249 lb 3.2 oz (113 kg)  Height: '5\' 7"'$  (1.702 m)   RA  Gen: well appearing HENT: OP clear, TM's clear, neck supple PULM: CTA B, normal effort CV: Irreg irreg, no mgr, no JVD GI: BS+, soft, nontender Derm: no cyanosis or rash Psyche: normal mood and affect   05/2015 CT chest [facility]) . Lungs: Groundglass density right upper lobe 14 x 7 mm previously 13 x 7 mm. Image 44 series 4. Linear densities left base no change.  Assessment & Plan:  Asthma, chronic obstructive, with acute exacerbation ([place]) This has been a stable interval for him despite his severe asthma.  He has not had an exacerbaiton since the last visit.  Plan Flu shot today Continue symbicort Continue prn albuterol F/u 6 months  Solitary pulmonary nodule He will have a repeat CT this week at [facility]    Current Outpatient Prescriptions:  .  acetaminophen (TYLENOL) 325 MG tablet, Take 2 tablets (650 mg total) by mouth every 4 (four) hours as needed for headache or mild pain., Disp: , Rfl:  .  albuterol (PROVENTIL HFA;VENTOLIN HFA) 108 (90 BASE) MCG/ACT inhaler, Inhale 2 puffs into the lungs every 6 (six) hours as needed for wheezing or shortness of breath., Disp: , Rfl:  .  apixaban (ELIQUIS) 5 MG TABS tablet,  Take 1 tablet (5 mg total) by mouth 2 (two) times daily., Disp: 180 tablet, Rfl: 3 .  budesonide-formoterol (SYMBICORT) 80-4.5 MCG/ACT inhaler, Inhale 2 puffs into the lungs 2 (two) times daily., Disp: 3 Inhaler, Rfl: 1 .  Calcium Carbonate-Vitamin D (CALCIUM + D) 600-200 MG-UNIT TABS, Take 1 tablet by mouth 2 (two) times daily., Disp: , Rfl:  .  diltiazem (CARDIZEM CD) 120 MG 24 hr capsule, Take 1 capsule by mouth  daily, Disp: 90 capsule, Rfl: 3 .  flecainide (TAMBOCOR) 50 MG tablet, Take 1 tablet (50 mg total) by mouth 2 (two) times daily., Disp: 28 tablet, Rfl: 0 .  fluticasone (FLONASE) 50 MCG/ACT nasal spray, Place 2 sprays into the nose at bedtime as needed for allergies. , Disp: , Rfl:  .  hydroxypropyl methylcellulose (ISOPTO TEARS) 2.5 % ophthalmic solution, Place 1 drop into both eyes 2 (two) times daily as needed for dry eyes. For dry eyes, Disp: , Rfl:  .  LORazepam (ATIVAN) 0.5 MG tablet, TAKE 1/2 - 1 TABLET TWICE A DAY AS NEEDED FOR ANXIETY, Disp: 15 tablet, Rfl: 0 .  montelukast (SINGULAIR) 10 MG tablet, Take 10 mg by mouth daily before breakfast. , Disp: , Rfl:  .  Multiple Vitamins tablet, Take 1 tablet by mouth daily., Disp: , Rfl:  .  nitroGLYCERIN (NITROSTAT) 0.4 MG SL tablet, Place 1 tablet (0.4 mg total) under the tongue every 5 (five) minutes x 3 doses as needed for chest pain., Disp: 25 tablet, Rfl: 3 .  Omega-3 Fatty Acids (FISH OIL) 1000 MG CAPS, Take 1,000 mg by mouth 2 (two) times daily., Disp: , Rfl:  .  omeprazole (PRILOSEC) 40 MG capsule, Take 40 mg by mouth at bedtime. , Disp: , Rfl:  .  telmisartan (MICARDIS) 80 MG tablet, Take 0.5 tablets (40 mg total) by mouth daily., Disp: 30 tablet, Rfl: 0

## 2015-12-06 ENCOUNTER — Telehealth: Payer: Self-pay | Admitting: Interventional Cardiology

## 2015-12-06 NOTE — Telephone Encounter (Signed)
Called pt to inform him that I was leaving a month supply of samples of Eliquis 5 mg tablets at the front desk for pt to pick up. Eliquis 5 mg LOT# [id number]  Exp: 03/2018. I advised the pt that if he has any other problems, questions or concerns to call the office. Pt verbalized understanding.

## 2015-12-06 NOTE — Telephone Encounter (Signed)
Patient calling the office for samples of medication:   1.  What medication and dosage are you requesting samples for? Eliquis '5mg'$   2.  Are you currently out of this medication? Has some left

## 2016-01-22 ENCOUNTER — Telehealth: Payer: Self-pay | Admitting: Interventional Cardiology

## 2016-01-22 NOTE — Telephone Encounter (Signed)
Called pt to inform him that I was leaving him 3 weeks supply of Eliquis 5 mg tablets samples and a application for pt assistance at the front desk for pt to pick up and if he could please return the application to Jenean Lindau, Therapist, sports. I advised the pt that if he has any other problems, questions or concerns to call the office. Pt verbalized understanding.     Eliquis 5 mg tablet    EXP: March 2020   LOT# [id number]

## 2016-01-22 NOTE — Telephone Encounter (Signed)
Patient calling the office for samples of medication:   1.  What medication and dosage are you requesting samples for?Eliquis '5mg'$ ( takes 2 a day )    2.  Are you currently out of this medication? No , has a week supply left

## 2016-02-07 ENCOUNTER — Telehealth: Payer: Self-pay | Admitting: Interventional Cardiology

## 2016-02-07 MED ORDER — APIXABAN 5 MG PO TABS: 5.0000 mg | ORAL_TABLET | Freq: Two times a day (BID) | ORAL | 3 refills | Status: DC

## 2016-02-07 NOTE — Telephone Encounter (Signed)
Pt's Rx sent to pt's pharmacy as requested. Confirmation received.

## 2016-03-18 DIAGNOSIS — H9202 Otalgia, left ear: Secondary | ICD-10-CM | POA: Diagnosis present

## 2016-03-18 DIAGNOSIS — J45909 Unspecified asthma, uncomplicated: Secondary | ICD-10-CM | POA: Diagnosis not present

## 2016-03-18 DIAGNOSIS — Z96653 Presence of artificial knee joint, bilateral: Secondary | ICD-10-CM | POA: Insufficient documentation

## 2016-03-18 DIAGNOSIS — I129 Hypertensive chronic kidney disease with stage 1 through stage 4 chronic kidney disease, or unspecified chronic kidney disease: Secondary | ICD-10-CM | POA: Diagnosis not present

## 2016-03-18 DIAGNOSIS — N189 Chronic kidney disease, unspecified: Secondary | ICD-10-CM | POA: Insufficient documentation

## 2016-03-18 DIAGNOSIS — Z87891 Personal history of nicotine dependence: Secondary | ICD-10-CM | POA: Diagnosis not present

## 2016-03-18 DIAGNOSIS — H7443 Polyp of middle ear, bilateral: Secondary | ICD-10-CM | POA: Diagnosis not present

## 2016-03-18 DIAGNOSIS — Z7901 Long term (current) use of anticoagulants: Secondary | ICD-10-CM | POA: Diagnosis not present

## 2016-03-18 DIAGNOSIS — Z8546 Personal history of malignant neoplasm of prostate: Secondary | ICD-10-CM | POA: Insufficient documentation

## 2016-03-19 ENCOUNTER — Encounter (HOSPITAL_COMMUNITY): Payer: Self-pay

## 2016-03-19 ENCOUNTER — Emergency Department (HOSPITAL_COMMUNITY)
Admission: EM | Admit: 2016-03-19 | Discharge: 2016-03-19 | Disposition: A | Discharge status: Home / Self Care | Payer: Medicare Other | Attending: Emergency Medicine | Admitting: Emergency Medicine

## 2016-03-19 DIAGNOSIS — H9222 Otorrhagia, left ear: Secondary | ICD-10-CM | POA: Insufficient documentation

## 2016-03-19 DIAGNOSIS — H61893 Other specified disorders of external ear, bilateral: Secondary | ICD-10-CM

## 2016-03-19 DIAGNOSIS — Z7901 Long term (current) use of anticoagulants: Secondary | ICD-10-CM | POA: Insufficient documentation

## 2016-03-19 MED ORDER — COCAINE HCL 4 % EX SOLN
4.0000 mL | Freq: Once | CUTANEOUS | Status: AC
  Administered 2016-03-19: 4 mL via NASAL
  Filled 2016-03-19: qty 4

## 2016-03-19 NOTE — ED Notes (Signed)
NP at bedside.

## 2016-03-19 NOTE — ED Triage Notes (Signed)
Pt states that he was working at his computer tonight and felt blood draining from his ear, no injury noted

## 2016-03-19 NOTE — Discharge Instructions (Signed)
Nodular evaluated for bleeding from the left ear.  You have a polyp or mass within the canal that was bleeding.  Bleeding has stopped.  Topical cocaine was applied to constrict blood vessels if it has another episode of bleeding.  Please packed the ear canal with a cotton ball and return for further evaluation You have been referred to Dr. Wilburn Cornelia who is an ENT specialist.  Please call today to set an appointment..  Please tell them you had a visit to the ED tonight and the cause and they will try to see you within 24 hours

## 2016-03-19 NOTE — ED Provider Notes (Signed)
[facility] DEPT Provider Note   CSN: [id number] Arrival date & time: 03/18/16  2347     History   Chief Complaint Chief Complaint  Patient presents with  . Ear Pain    HPI [name] [name] is a 76 y.o. male.  Is a 76 year old male who states that he was sitting at his computer when he suddenly felt something dripping from his left ear when he wiped it.  He noticed it was blood.  He is currently taking Eliquis for atrial fibrillation He states that he has had URI symptoms with some congestion for the past several weeks, but no ear pain Denies any hearing loss, no recent travel, no known trauma to the ear      Past Medical History:  Diagnosis Date  . Angina    chest pain- cardiac cath. followed in 2010, record available ,  told then that he should f/u /w Dr. Marisue Humble    . Arthritis    R knee, back    . Asthma    uses  singulair  . Cancer [facility])    prostate, melanoma- 2010, excision    . Chronic kidney disease    prostate cancer - surg. removal- 1996  . Colon polyps   . Diverticulitis   . Dysrhythmia    palpitations, followed by Dr.Ehinger, seen in prep for surgery on 03/27/2011   . GERD (gastroesophageal reflux disease)   . Hepatitis    jaundice- many yrs. ago  . Hiatal hernia   . Hypertension   . Melanoma ([place])    Face  . OSA (obstructive sleep apnea) 04/11/2015   Mild to moderate OSA with AHI 13.3/hr overall and AHI 36.8/hr during REM sleep.  Oxygen saturations were as low as 86% during respiratory events.  . Pneumonia    hosp. 20 yrs. ago  . Prostate cancer ([place]) 1995  . Sleep apnea    study done, 10 yrs. ago, told that he needed  CPAP but  never used     Patient Active Problem List   Diagnosis Date Noted  . Anxiety 10/30/2015  . Dyspnea 08/20/2015  . Solitary pulmonary nodule 05/18/2015  . OSA (obstructive sleep apnea) 04/11/2015  . Normal coronary arteries 2013 03/17/2015  . Chronic anticoagulation 03/17/2015  . Atrial fibrillation with RVR ([place])  03/17/2015  . Asthma, chronic obstructive, with acute exacerbation ([place]) 02/26/2015  . PAF (paroxysmal atrial fibrillation) ([place]) 01/26/2015  . Melanoma of skin ([place])   . Chest pain at rest 09/06/2011  . Postoperative anemia due to acute blood loss 04/24/2011  . Osteoarthritis of knee 04/23/2011  . Hypertension 04/23/2011  . History of prostate cancer 04/23/2011  . Melanoma ([place]) 04/23/2011  . Hiatal hernia 04/23/2011  . Asthma 04/23/2011  . Obesity, Class II, BMI 35-39.9, with comorbidity ([place]) 04/23/2011    Past Surgical History:  Procedure Laterality Date  . CARDIAC CATHETERIZATION     2010  . CARDIAC CATHETERIZATION N/A 10/22/2015   Procedure: Left Heart Cath and Coronary Angiography;  Surgeon: Nelva Bush, MD;  Location: [facility];  Service: Cardiovascular;  Laterality: N/A;  . FRACTURE SURGERY     L wrist, hardware- 1982  . JOINT REPLACEMENT     L knee, 2009  . LEFT HEART CATHETERIZATION WITH CORONARY ANGIOGRAM N/A 09/08/2011   Procedure: LEFT HEART CATHETERIZATION WITH CORONARY ANGIOGRAM;  Surgeon: Sinclair Grooms, MD;  Location: [facility] CATH LAB;  Service: Cardiovascular;  Laterality: N/A;  . PROSTATECTOMY     for ca  .  TONSILLECTOMY     as child  . TOTAL KNEE ARTHROPLASTY  04/22/2011   Procedure: TOTAL KNEE ARTHROPLASTY;  Surgeon: Garald Balding, MD;  Location: [place] [place];  Service: Orthopedics;  Laterality: Right;       Home Medications    Prior to Admission medications   Medication Sig Start Date End Date Taking? Authorizing Provider  acetaminophen (TYLENOL) 325 MG tablet Take 2 tablets (650 mg total) by mouth every 4 (four) hours as needed for headache or mild pain. 03/18/15   Erlene Quan, PA-C  albuterol (PROVENTIL HFA;VENTOLIN HFA) 108 (90 BASE) MCG/ACT inhaler Inhale 2 puffs into the lungs every 6 (six) hours as needed for wheezing or shortness of breath.    Historical Provider, MD  apixaban (ELIQUIS) 5 MG TABS tablet Take 1 tablet (5 mg total) by mouth  2 (two) times daily. 02/07/16   Belva Crome, MD  budesonide-formoterol [facility]) 160-4.5 MCG/ACT inhaler Inhale 2 puffs into the lungs 2 (two) times daily. 12/03/15 12/04/15  Juanito Doom, MD  budesonide-formoterol (SYMBICORT) 80-4.5 MCG/ACT inhaler Inhale 2 puffs into the lungs 2 (two) times daily. 05/11/15   Juanito Doom, MD  Calcium Carbonate-Vitamin D (CALCIUM + D) 600-200 MG-UNIT TABS Take 1 tablet by mouth 2 (two) times daily.    Historical Provider, MD  diltiazem (CARDIZEM CD) 120 MG 24 hr capsule Take 1 capsule by mouth  daily 06/26/15   Belva Crome, MD  flecainide (TAMBOCOR) 50 MG tablet Take 1 tablet (50 mg total) by mouth 2 (two) times daily. 07/13/15   Belva Crome, MD  fluticasone (FLONASE) 50 MCG/ACT nasal spray Place 2 sprays into the nose at bedtime as needed for allergies.     Historical Provider, MD  hydroxypropyl methylcellulose (ISOPTO TEARS) 2.5 % ophthalmic solution Place 1 drop into both eyes 2 (two) times daily as needed for dry eyes. For dry eyes    Historical Provider, MD  LORazepam (ATIVAN) 0.5 MG tablet TAKE 1/2 - 1 TABLET TWICE A DAY AS NEEDED FOR ANXIETY 10/30/15   Belva Crome, MD  montelukast (SINGULAIR) 10 MG tablet Take 10 mg by mouth daily before breakfast.     Historical Provider, MD  Multiple Vitamins tablet Take 1 tablet by mouth daily.    Historical Provider, MD  nitroGLYCERIN (NITROSTAT) 0.4 MG SL tablet Place 1 tablet (0.4 mg total) under the tongue every 5 (five) minutes x 3 doses as needed for chest pain. 03/18/15   Erlene Quan, PA-C  Omega-3 Fatty Acids (FISH OIL) 1000 MG CAPS Take 1,000 mg by mouth 2 (two) times daily.    Historical Provider, MD  omeprazole (PRILOSEC) 40 MG capsule Take 40 mg by mouth at bedtime.     Historical Provider, MD  telmisartan (MICARDIS) 80 MG tablet Take 0.5 tablets (40 mg total) by mouth daily. 01/26/15   Florencia Reasons, MD    Family History Family History  Problem Relation Age of Onset  . Crohn's disease Brother   .  Breast cancer Mother   . Alzheimer's disease Father   . Heart disease Brother   . Hypertension Brother   . Anesthesia problems Neg Hx   . Hypotension Neg Hx   . Malignant hyperthermia Neg Hx   . Pseudochol deficiency Neg Hx     Social History Social History  Substance Use Topics  . Smoking status: Former Smoker    Packs/day: 1.50    Years: 30.00    Types: Cigarettes  Quit date: 04/11/1983  . Smokeless tobacco: Never Used  . Alcohol use No     Allergies   Aminophylline; Carvedilol; Lisinopril; Tenormin [atenolol]; and Zolpidem tartrate   Review of Systems Review of Systems  Constitutional: Negative for chills and fever.  HENT: Positive for congestion and ear discharge. Negative for ear pain.   Respiratory: Positive for cough.   Neurological: Negative for dizziness and headaches.  All other systems reviewed and are negative.    Physical Exam Updated Vital Signs BP 150/75   Pulse (!) 58   Temp 97.6 F (36.4 C) (Oral)   Resp 18   Ht '5\' 7"'$  (1.702 m)   Wt 113.4 kg   SpO2 95%   BMI 39.16 kg/m   Physical Exam  Constitutional: He appears well-developed and well-nourished.  HENT:  Head: Normocephalic.  Right Ear: External ear normal.  Left Ear: External ear normal. No lacerations. No tenderness. No foreign bodies.  No middle ear effusion. No decreased hearing is noted.  There appears to be a gross at the 4:00 location with surrounding blood clot.  No active bleeding at this time  Eyes: Pupils are equal, round, and reactive to light.  Neck: Normal range of motion.  Cardiovascular: Normal rate.   Pulmonary/Chest: Effort normal.  Musculoskeletal: Normal range of motion.  Neurological: He is alert.  Skin: Skin is warm and dry.  Nursing note and vitals reviewed.    ED Treatments / Results  Labs (all labs ordered are listed, but only abnormal results are displayed) Labs Reviewed - No data to display  EKG  EKG Interpretation None       Radiology No  results found.  Procedures Procedures (including critical care time)  Medications Ordered in ED Medications  cocaine 4 % topical solution 4 mL (4 mLs Nasal Given 03/19/16 0335)     Initial Impression / Assessment and Plan / ED Course  I have reviewed the triage vital signs and the nursing notes.  Pertinent labs & imaging results that were available during my care of the patient were reviewed by me and considered in my medical decision making (see chart for details).  Clinical Course   Ear canal cleared of several large blood clots.  No active bleeding at this time.  There appears to be a polyp or growth at the 4:00 location within the ear canal.  TM is intact     Final Clinical Impressions(s) / ED Diagnoses   Final diagnoses:  Polyp of both ear canals    New Prescriptions New Prescriptions   No medications on file     Junius Creamer, NP 03/19/16 [id number]    Everlene Balls, MD 03/19/16 1517

## 2016-03-21 ENCOUNTER — Telehealth: Payer: Self-pay | Admitting: Pulmonary Disease

## 2016-03-21 MED ORDER — DOXYCYCLINE HYCLATE 100 MG PO TABS: 100.0000 mg | ORAL_TABLET | Freq: Two times a day (BID) | ORAL | 0 refills | Status: DC

## 2016-03-21 MED ORDER — PREDNISONE 10 MG PO TABS: ORAL_TABLET | ORAL | 0 refills | Status: DC

## 2016-03-21 NOTE — Telephone Encounter (Signed)
Not sure what to make of the ear problem, would see PCP if worsens  Would offer : prednisone: Take '40mg'$  po daily for 3 days, then take '30mg'$  po daily for 3 days, then take '20mg'$  po daily for two days, then take '10mg'$  po daily for 2 days  Doxycycline '100mg'$  po bid x5 days  Go to MD office ([place] or PCP) if worse

## 2016-03-21 NOTE — Telephone Encounter (Signed)
Spoke with pt, who states around 02-27-16 he developed a cold, pt states he has a lingering prod cough with cream colored mucus & wheezing. Pt seen PCP last week, and was prescribed prednisone and hydcoan. Pt completed prednisone taper yesterday with no relief. Pt also states on Wednesday night, his ears suddenly started to bleed for 3 hours. Pt went to ED and was told he has a lesion in his ear.  BQ plea advise. Thanks.

## 2016-03-21 NOTE — Telephone Encounter (Signed)
Pt aware of recs.  rx's called in to preferred pharmacy.  Nothing further needed.

## 2016-04-23 ENCOUNTER — Ambulatory Visit (INDEPENDENT_AMBULATORY_CARE_PROVIDER_SITE_OTHER): Payer: Medicare Other | Admitting: Pulmonary Disease

## 2016-04-23 ENCOUNTER — Encounter: Payer: Self-pay | Admitting: Pulmonary Disease

## 2016-04-23 DIAGNOSIS — J45901 Unspecified asthma with (acute) exacerbation: Secondary | ICD-10-CM

## 2016-04-23 DIAGNOSIS — J441 Chronic obstructive pulmonary disease with (acute) exacerbation: Secondary | ICD-10-CM

## 2016-04-23 DIAGNOSIS — R911 Solitary pulmonary nodule: Secondary | ICD-10-CM | POA: Diagnosis not present

## 2016-04-23 MED ORDER — BUDESONIDE-FORMOTEROL FUMARATE 160-4.5 MCG/ACT IN AERO: 2.0000 | INHALATION_SPRAY | Freq: Two times a day (BID) | RESPIRATORY_TRACT | 0 refills | Status: DC

## 2016-04-23 MED ORDER — BUDESONIDE-FORMOTEROL FUMARATE 160-4.5 MCG/ACT IN AERO: 1.0000 | INHALATION_SPRAY | Freq: Two times a day (BID) | RESPIRATORY_TRACT | 0 refills | Status: DC

## 2016-04-23 NOTE — Assessment & Plan Note (Signed)
He had a groundglass nodule identified initially on a February 2016 PET/CT performed at the time of a diagnosis of melanoma. The lesion is now being measured at 14 mm in size but has been stable over the last several CT scans. Because his groundglass I remain concerned about the possibility of occult adenocarcinoma. He needs continued surveillance CT scans for a total of 5 years since its initial discovery. He will continue to get these at [facility] and I will continue to follow up with him after those visits.

## 2016-04-23 NOTE — Progress Notes (Signed)
Subjective:    Patient ID: [name] [name], male    DOB: 1940/12/19, 76 y.o.   MRN: [id number]  Synopsis: Referred in 2016 for dyspnea due to asthma based on PFT showing reversible airflow obstruction. He quit smoking around 1975; he smoked for about 30-35 years 1.5 ppd. Childhood polio, was in an iron lung  HPI Chief Complaint  Patient presents with  . Follow-up    pt c/o sob with exertion.  denies congestion, chest pain.     [name] [name] has gained at least 16 pounds in the last few months because he is eating all the time and not exercising.  He has been sitting with wife a lot because she had a seizure, this is making him have to sit with her a lot and because of this he has not been active a lot because of it.    He had been taking one puff of symbicort daily because he had a prescription for 160/4.5 sent to him.     Past Medical History:  Diagnosis Date  . Angina    chest pain- cardiac cath. followed in 2010, record available ,  told then that he should f/u /w Dr. Marisue Humble    . Arthritis    R knee, back    . Asthma    uses  singulair  . Cancer [facility])    prostate, melanoma- 2010, excision    . Chronic kidney disease    prostate cancer - surg. removal- 1996  . Colon polyps   . Diverticulitis   . Dysrhythmia    palpitations, followed by Dr.Ehinger, seen in prep for surgery on 03/27/2011   . GERD (gastroesophageal reflux disease)   . Hepatitis    jaundice- many yrs. ago  . Hiatal hernia   . Hypertension   . Melanoma ([place])    Face  . OSA (obstructive sleep apnea) 04/11/2015   Mild to moderate OSA with AHI 13.3/hr overall and AHI 36.8/hr during REM sleep.  Oxygen saturations were as low as 86% during respiratory events.  . Pneumonia    hosp. 20 yrs. ago  . Prostate cancer ([place]) 1995  . Sleep apnea    study done, 10 yrs. ago, told that he needed  CPAP but  never used       Review of Systems  Constitutional: Negative for chills, fatigue and fever.  HENT: Negative for  nosebleeds, postnasal drip and rhinorrhea.   Respiratory: Negative for cough, shortness of breath and wheezing.   Cardiovascular: Negative for chest pain, palpitations and leg swelling.       Objective:   Physical Exam Vitals:   04/23/16 1103  BP: 128/70  Pulse: 69  SpO2: 95%  Weight: 258 lb (117 kg)  Height: '5\' 7"'$  (1.702 m)   RA  Gen: obese, well appearing HENT: OP clear, TM's clear, neck supple PULM: CTA B, normal percussion CV: RRR, no mgr, trace edema GI: BS+, soft, nontender Derm: no cyanosis or rash Psyche: normal mood and affect     Echo November 2016 echocardiogram LVEF 60-65%, moderate to severe left atrial enlargement, normal RV size and function  Imaging November 2016 chest x-ray mild atelectasis left base, likely chronic otherwise normal, cardiomegaly noted 05/2015 CT chest [facility]) . Lungs: Groundglass density right upper lobe 14 x 7 mm previously 13 x 7 mm. Image 44 series 4. Linear densities left base no change. 11/2015 CT chest [facility]: 14x47m RUL nodules is stable.  No lymphedenopathy   Lung function testing May 2015  PFT> pre-albuterol ratio 65%, FEV1 2.26 L (83% predicted), post albuterol FEV1 2.65 L (17% predicted, 400 mL change), total lung capacity 6.40 L (99% predicted) disease, DLCO 28.25 (99% pred)      Assessment & Plan:  Asthma, chronic obstructive, with acute exacerbation ([name] [name]) This has been a stable interval for him. He tolerates Symbicort 160/4.5 one puff twice a day quite well as this controls his symptoms and saves him money.  Plan: Continue Symbicort 160/4.5 one puff twice a day Stay active Follow-up 8 months  Solitary pulmonary nodule He had a groundglass nodule identified initially on a February 2016 PET/CT performed at the time of a diagnosis of melanoma. The lesion is now being measured at 14 mm in size but has been stable over the last several CT scans. Because his groundglass I remain concerned about the possibility of occult  adenocarcinoma. He needs continued surveillance CT scans for a total of 5 years since its initial discovery. He will continue to get these at [facility] and I will continue to follow up with him after those visits.    Current Outpatient Prescriptions:  .  acetaminophen (TYLENOL) 325 MG tablet, Take 2 tablets (650 mg total) by mouth every 4 (four) hours as needed for headache or mild pain., Disp: , Rfl:  .  albuterol (PROVENTIL HFA;VENTOLIN HFA) 108 (90 BASE) MCG/ACT inhaler, Inhale 2 puffs into the lungs every 6 (six) hours as needed for wheezing or shortness of breath., Disp: , Rfl:  .  apixaban (ELIQUIS) 5 MG TABS tablet, Take 1 tablet (5 mg total) by mouth 2 (two) times daily., Disp: 180 tablet, Rfl: 3 .  budesonide-formoterol (SYMBICORT) 160-4.5 MCG/ACT inhaler, Inhale 2 puffs into the lungs 2 (two) times daily., Disp: 1 Inhaler, Rfl: 0 .  Calcium Carbonate-Vitamin D (CALCIUM + D) 600-200 MG-UNIT TABS, Take 1 tablet by mouth 2 (two) times daily., Disp: , Rfl:  .  diltiazem (CARDIZEM CD) 120 MG 24 hr capsule, Take 1 capsule by mouth  daily, Disp: 90 capsule, Rfl: 3 .  flecainide (TAMBOCOR) 50 MG tablet, Take 1 tablet (50 mg total) by mouth 2 (two) times daily., Disp: 28 tablet, Rfl: 0 .  fluticasone (FLONASE) 50 MCG/ACT nasal spray, Place 2 sprays into the nose at bedtime as needed for allergies. , Disp: , Rfl:  .  hydroxypropyl methylcellulose (ISOPTO TEARS) 2.5 % ophthalmic solution, Place 1 drop into both eyes 2 (two) times daily as needed for dry eyes. For dry eyes, Disp: , Rfl:  .  montelukast (SINGULAIR) 10 MG tablet, Take 10 mg by mouth daily before breakfast. , Disp: , Rfl:  .  Multiple Vitamins tablet, Take 1 tablet by mouth daily., Disp: , Rfl:  .  nitroGLYCERIN (NITROSTAT) 0.4 MG SL tablet, Place 1 tablet (0.4 mg total) under the tongue every 5 (five) minutes x 3 doses as needed for chest pain., Disp: 25 tablet, Rfl: 3 .  Omega-3 Fatty Acids (FISH OIL) 1000 MG CAPS, Take 1,000 mg by  mouth 2 (two) times daily., Disp: , Rfl:  .  omeprazole (PRILOSEC) 40 MG capsule, Take 40 mg by mouth at bedtime. , Disp: , Rfl:  .  telmisartan (MICARDIS) 80 MG tablet, Take 0.5 tablets (40 mg total) by mouth daily., Disp: 30 tablet, Rfl: 0 .  budesonide-formoterol (SYMBICORT) 160-4.5 MCG/ACT inhaler, Inhale 1 puff into the lungs 2 (two) times daily., Disp: 2 Inhaler, Rfl: 0

## 2016-04-23 NOTE — Assessment & Plan Note (Signed)
This has been a stable interval for him. He tolerates Symbicort 160/4.5 one puff twice a day quite well as this controls his symptoms and saves him money.  Plan: Continue Symbicort 160/4.5 one puff twice a day Stay active Follow-up 8 months

## 2016-04-23 NOTE — Patient Instructions (Signed)
Take Symbicort 1 puff twice a day Stay active We will see you after your next CT scan at [facility], we will aim for a visit in September or October 2018.

## 2016-04-29 ENCOUNTER — Other Ambulatory Visit: Payer: Self-pay | Admitting: Interventional Cardiology

## 2016-04-30 ENCOUNTER — Other Ambulatory Visit: Payer: Self-pay | Admitting: *Deleted

## 2016-04-30 MED ORDER — DILTIAZEM HCL ER COATED BEADS 120 MG PO CP24: 120.0000 mg | ORAL_CAPSULE | Freq: Every day | ORAL | 1 refills | Status: DC

## 2016-04-30 MED ORDER — FLECAINIDE ACETATE 50 MG PO TABS: 50.0000 mg | ORAL_TABLET | Freq: Two times a day (BID) | ORAL | 1 refills | Status: DC

## 2016-05-01 NOTE — Progress Notes (Addendum)
Cardiology Office Note    Date: 05/02/2016  ID:  Pleas, Carneal Dec 24, 1940, MRN [id number]  PCP:  Simona Huh, MD  Cardiologist: Sinclair Grooms, MD   Chief Complaint  Patient presents with  . Atrial Fibrillation    History of Present Illness:  [name] [name] is a 76 y.o. male with a history of paroxysmal atrial fibrillation, recurring chest discomfort, hypertension, and chronic kidney disease.  No recurrence of atrial fibrillation since starting flecainide. Has noticed increased lower extremity swelling since we added diltiazem. He denies chest pain, orthopnea, syncope, and dyspnea on exertion. Has had difficulty with medication intolerance including beta blockers and ace inhibitors.   Past Medical History:  Diagnosis Date  . Angina    chest pain- cardiac cath. followed in 2010, record available ,  told then that he should f/u /w Dr. Marisue Humble    . Arthritis    R knee, back    . Asthma    uses  singulair  . Cancer [facility])    prostate, melanoma- 2010, excision    . Chronic kidney disease    prostate cancer - surg. removal- 1996  . Colon polyps   . Diverticulitis   . Dysrhythmia    palpitations, followed by Dr.Ehinger, seen in prep for surgery on 03/27/2011   . GERD (gastroesophageal reflux disease)   . Hepatitis    jaundice- many yrs. ago  . Hiatal hernia   . Hypertension   . 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 OSA (obstructive sleep apnea) 04/11/2015   Mild to moderate OSA with AHI 13.3/hr overall and AHI 36.8/hr during REM sleep.  Oxygen saturations were as low as 86% during respiratory events.  . Pneumonia    hosp. 20 yrs. ago  . Prostate cancer ([place]) 1995  . Sleep apnea    study done, 10 yrs. ago, told that he needed  CPAP but  never used     Past Surgical History:  Procedure Laterality Date  . CARDIAC CATHETERIZATION     2010  . CARDIAC CATHETERIZATION N/A 10/22/2015   Procedure: Left Heart Cath and Coronary Angiography;  Surgeon: Nelva Bush, MD;   Location: [place] CV LAB;  Service: Cardiovascular;  Laterality: N/A;  . FRACTURE SURGERY     L wrist, hardware- 1982  . JOINT REPLACEMENT     L knee, 2009  . LEFT HEART CATHETERIZATION WITH CORONARY ANGIOGRAM N/A 09/08/2011   Procedure: LEFT HEART CATHETERIZATION WITH CORONARY ANGIOGRAM;  Surgeon: Sinclair Grooms, MD;  Location: [facility] CATH LAB;  Service: Cardiovascular;  Laterality: N/A;  . PROSTATECTOMY     for ca  . TONSILLECTOMY     as child  . TOTAL KNEE ARTHROPLASTY  04/22/2011   Procedure: TOTAL KNEE ARTHROPLASTY;  Surgeon: Garald Balding, MD;  Location: [place];  Service: Orthopedics;  Laterality: Right;    Current Medications: Outpatient Medications Prior to Visit  Medication Sig Dispense Refill  . acetaminophen (TYLENOL) 325 MG tablet Take 2 tablets (650 mg total) by mouth every 4 (four) hours as needed for headache or mild pain.    Marland Kitchen albuterol (PROVENTIL HFA;VENTOLIN HFA) 108 (90 BASE) MCG/ACT inhaler Inhale 2 puffs into the lungs every 6 (six) hours as needed for wheezing or shortness of breath.    . budesonide-formoterol (SYMBICORT) 160-4.5 MCG/ACT inhaler Inhale 1 puff into the lungs 2 (two) times daily. 2 Inhaler 0  . Calcium Carbonate-Vitamin D (CALCIUM + D) 600-200 MG-UNIT TABS Take 1 tablet  by mouth 2 (two) times daily.    . flecainide (TAMBOCOR) 50 MG tablet Take 1 tablet (50 mg total) by mouth 2 (two) times daily. 180 tablet 1  . fluticasone (FLONASE) 50 MCG/ACT nasal spray Place 2 sprays into the nose at bedtime as needed for allergies.     . hydroxypropyl methylcellulose (ISOPTO TEARS) 2.5 % ophthalmic solution Place 1 drop into both eyes 2 (two) times daily as needed for dry eyes. For dry eyes    . montelukast (SINGULAIR) 10 MG tablet Take 10 mg by mouth daily before breakfast.     . Multiple Vitamins tablet Take 1 tablet by mouth daily.    . nitroGLYCERIN (NITROSTAT) 0.4 MG SL tablet Place 1 tablet (0.4 mg total) under the tongue every 5 (five) minutes x 3 doses as  needed for chest pain. 25 tablet 3  . Omega-3 Fatty Acids (FISH OIL) 1000 MG CAPS Take 1,000 mg by mouth 2 (two) times daily.    Marland Kitchen omeprazole (PRILOSEC) 40 MG capsule Take 40 mg by mouth at bedtime.     Marland Kitchen apixaban (ELIQUIS) 5 MG TABS tablet Take 1 tablet (5 mg total) by mouth 2 (two) times daily. 180 tablet 3  . diltiazem (CARDIZEM CD) 120 MG 24 hr capsule Take 1 capsule (120 mg total) by mouth daily. 90 capsule 1  . telmisartan (MICARDIS) 80 MG tablet Take 0.5 tablets (40 mg total) by mouth daily. 30 tablet 0  . budesonide-formoterol (SYMBICORT) 160-4.5 MCG/ACT inhaler Inhale 2 puffs into the lungs 2 (two) times daily. 1 Inhaler 0   No facility-administered medications prior to visit.      Allergies:   Aminophylline; Carvedilol; Lisinopril; Tenormin [atenolol]; and Zolpidem tartrate   Social History   Social History  . Marital status: Married    Spouse name: N/A  . Number of children: N/A  . Years of education: N/A   Social History Main Topics  . Smoking status: Former Smoker    Packs/day: 1.50    Years: 30.00    Types: Cigarettes    Quit date: 04/11/1983  . Smokeless tobacco: Never Used  . Alcohol use No  . Drug use: No  . Sexual activity: No   Other Topics Concern  . None   Social History Narrative  . None     Family History:  The patient's family history includes Alzheimer's disease in his father; Breast cancer in his mother; Crohn's disease in his brother; Heart disease in his brother; Hypertension in his brother.   ROS:   Please see the history of present illness.    Headaches, bleeding in his ears, and lower extremity swelling, aggravated by the addition of diltiazem.  All other systems reviewed and are negative.   PHYSICAL EXAM:   VS:  BP 128/76 (BP Location: Left Arm)   Pulse 66   Ht '5\' 7"'$  (1.702 m)   Wt 257 lb 12.8 oz (116.9 kg)   BMI 40.38 kg/m    GEN: Well nourished, well developed, in no acute distress  HEENT: normal  Neck: no JVD, carotid bruits, or  masses Cardiac: RRR; no murmurs, rubs, or gallops,no edema  Respiratory:  clear to auscultation bilaterally, normal work of breathing GI: soft, nontender, nondistended, + BS MS: no deformity or atrophy  Skin: warm and dry, no rash Neuro:  Alert and Oriented x 3, Strength and sensation are intact Psych: euthymic mood, full affect  Wt Readings from Last 3 Encounters:  05/02/16 257 lb 12.8 oz (116.9 kg)  04/23/16 258 lb (117 kg)  03/19/16 250 lb (113.4 kg)      Studies/Labs Reviewed:   EKG:  EKG  No performed  Recent Labs: 08/20/2015: Pro B Natriuretic peptide (BNP) 65.0 09/30/2015: B Natriuretic Peptide 193.6 10/17/2015: Hemoglobin 13.5; Platelets 161 05/16/2016: BUN 15; Creatinine, Ser 1.33; Potassium 4.6; Sodium 145   Lipid Panel No results found for: CHOL, TRIG, HDL, CHOLHDL, VLDL, LDLCALC, LDLDIRECT  Additional studies/ records that were reviewed today include:  No new data    ASSESSMENT:    1. Atrial fibrillation with RVR ([place])   2. Essential hypertension   3. OSA (obstructive sleep apnea)   4. Chronic anticoagulation      PLAN:  In order of problems listed above:  1. Clinically in normal sinus rhythm today. Therefore, Flecainide appears to be suppressing recurrent atrial fibrillation. Plan to discontinue diltiazem since it is aggravating his lower extremity swelling. Call with report of lower extremity swelling in one month off of diltiazem. Also notify us if he notices increased heart rate or sensations similar to prior atrial fibrillation. 2. Increased Micardis to 80 mg per day. 3.   Continue C Pap 4.   Continue Eliquis. Continue to monitor for bleeding.  Overall plan is to follow-up in 6 months. He should call if breathlessness, rapid palpitations, failure of lower extremity edema to improve off diltiazem, or other complaints.   Medication Adjustments/Labs and Tests Ordered: Current medicines are reviewed at length with the patient today.  Concerns regarding  medicines are outlined above.  Medication changes, Labs and Tests ordered today are listed in the Patient Instructions below. Patient Instructions  Medication Instructions:  1) DISCONTINUE Diltiazem 2) INCREASE Telmisartan to '80mg'$  once daily  Labwork: None  Testing/Procedures: None  Follow-Up: Your physician wants you to follow-up in: 6 months with Dr. Tamala Julian. You will receive a reminder letter in the mail two months in advance. If you don't receive a letter, please call our office to schedule the follow-up appointment.    Any Other Special Instructions Will Be Listed Below (If Applicable).  Contact our office in a month and let us know how the swelling is in your legs.  We need to know if swelling has improved back down to what it was prior to starting the Diltiazem.    If you need a refill on your cardiac medications before your next appointment, please call your pharmacy.      Signed, Sinclair Grooms, MD  05/02/2016    [facility] [place], [place], [place]  [address] Phone: [phone]; Fax: [phone]

## 2016-05-02 ENCOUNTER — Encounter: Payer: Self-pay | Admitting: Interventional Cardiology

## 2016-05-02 ENCOUNTER — Ambulatory Visit (INDEPENDENT_AMBULATORY_CARE_PROVIDER_SITE_OTHER): Payer: Medicare Other | Admitting: Interventional Cardiology

## 2016-05-02 VITALS — BP 128/76 | HR 66 | Ht 67.0 in | Wt 257.8 lb

## 2016-05-02 DIAGNOSIS — G4733 Obstructive sleep apnea (adult) (pediatric): Secondary | ICD-10-CM

## 2016-05-02 DIAGNOSIS — I4891 Unspecified atrial fibrillation: Secondary | ICD-10-CM

## 2016-05-02 DIAGNOSIS — I10 Essential (primary) hypertension: Secondary | ICD-10-CM | POA: Diagnosis not present

## 2016-05-02 DIAGNOSIS — Z7901 Long term (current) use of anticoagulants: Secondary | ICD-10-CM

## 2016-05-02 MED ORDER — TELMISARTAN 80 MG PO TABS: 80.0000 mg | ORAL_TABLET | Freq: Every day | ORAL | 11 refills | Status: DC

## 2016-05-02 NOTE — Patient Instructions (Signed)
Medication Instructions:  1) DISCONTINUE Diltiazem 2) INCREASE Telmisartan to '80mg'$  once daily  Labwork: None  Testing/Procedures: None  Follow-Up: Your physician wants you to follow-up in: 6 months with Dr. Tamala Julian. You will receive a reminder letter in the mail two months in advance. If you don't receive a letter, please call our office to schedule the follow-up appointment.    Any Other Special Instructions Will Be Listed Below (If Applicable).  Contact our office in a month and let us know how the swelling is in your legs.  We need to know if swelling has improved back down to what it was prior to starting the Diltiazem.    If you need a refill on your cardiac medications before your next appointment, please call your pharmacy.

## 2016-05-07 ENCOUNTER — Ambulatory Visit: Payer: Medicare Other | Admitting: Pulmonary Disease

## 2016-05-07 ENCOUNTER — Telehealth: Payer: Self-pay | Admitting: Interventional Cardiology

## 2016-05-07 DIAGNOSIS — I480 Paroxysmal atrial fibrillation: Secondary | ICD-10-CM

## 2016-05-07 DIAGNOSIS — M7989 Other specified soft tissue disorders: Secondary | ICD-10-CM

## 2016-05-07 DIAGNOSIS — I10 Essential (primary) hypertension: Secondary | ICD-10-CM

## 2016-05-07 NOTE — Telephone Encounter (Signed)
New message     Pt c/o medication issue:  1. Name of Medication: diltiazem 2. How are you currently taking this medication (dosage and times per day)?    3. Are you having a reaction (difficulty breathing--STAT)?    4. What is your medication issue?  Wife states doctor stopped this medication approx 1 week ago.  He was to call in a month and give an update, but pt legs are very swollen that pt is afraid to wait one month.  Please call

## 2016-05-07 NOTE — Telephone Encounter (Signed)
Attempted to call wife back.  Phone rang several times with no answer and no VM.  Will try again later.

## 2016-05-08 ENCOUNTER — Telehealth: Payer: Self-pay | Admitting: Interventional Cardiology

## 2016-05-08 MED ORDER — HYDROCHLOROTHIAZIDE 12.5 MG PO CAPS: 12.5000 mg | ORAL_CAPSULE | Freq: Every day | ORAL | 1 refills | Status: DC

## 2016-05-08 NOTE — Telephone Encounter (Signed)
New message    Please call they on the cell phone they need to leave the house

## 2016-05-08 NOTE — Telephone Encounter (Signed)
Follow Up:    Returning your call from yesterday,concerning medicine,

## 2016-05-08 NOTE — Telephone Encounter (Signed)
Start Hctz 12.5 mg daily. Measure BP daily. BMET 7-10 days

## 2016-05-08 NOTE — Telephone Encounter (Signed)
Spoke with pt and clarified that he was just calling to give this information for when Dr.Smith responds to message sent.  Pt said this is correct.  Advised I will call cell once I hear back from Dr. Tamala Julian.

## 2016-05-08 NOTE — Telephone Encounter (Signed)
Diltiazem was d/c'ed 05/02/16 to see if swelling would improve.  Pt concerned that swelling has not improved at all at this point and feels it could possibly be a little worse but he is unsure.  HR is 60-70.  Swelling does improve at night but worsens throughout the day as he is up moving around or sitting with feet down.  Denies weeping.  Will route to Dr. Tamala Julian for review and advisement.

## 2016-05-08 NOTE — Telephone Encounter (Signed)
Spoke with pt and went over recommendations per Dr. Tamala Julian.  Pt will have labs on 05/16/16.  Advised would send 30 day supply to [facility] and if pt is able to tolerate medication and it becomes long term, we will send 90 day prescription to [facility].  Pt verbalized understanding and was in agreement with this plan.

## 2016-05-13 ENCOUNTER — Other Ambulatory Visit: Payer: Self-pay | Admitting: *Deleted

## 2016-05-13 MED ORDER — APIXABAN 5 MG PO TABS: 5.0000 mg | ORAL_TABLET | Freq: Two times a day (BID) | ORAL | 3 refills | Status: DC

## 2016-05-16 ENCOUNTER — Other Ambulatory Visit: Payer: Medicare Other | Admitting: *Deleted

## 2016-05-16 DIAGNOSIS — M7989 Other specified soft tissue disorders: Secondary | ICD-10-CM

## 2016-05-16 DIAGNOSIS — I10 Essential (primary) hypertension: Secondary | ICD-10-CM

## 2016-05-16 DIAGNOSIS — I480 Paroxysmal atrial fibrillation: Secondary | ICD-10-CM

## 2016-05-18 LAB — BASIC METABOLIC PANEL
BUN/Creatinine Ratio: 11 (ref 10–24)
BUN: 15 mg/dL (ref 8–27)
CALCIUM: 9.2 mg/dL (ref 8.6–10.2)
CO2: 28 mmol/L (ref 18–29)
Chloride: 102 mmol/L (ref 96–106)
Creatinine, Ser: 1.33 mg/dL — ABNORMAL HIGH (ref 0.76–1.27)
GFR, EST AFRICAN AMERICAN: 59 mL/min/{1.73_m2} — AB (ref >59)
GFR, EST NON AFRICAN AMERICAN: 51 mL/min/{1.73_m2} — AB (ref >59)
Glucose: 99 mg/dL (ref 65–99)
POTASSIUM: 4.6 mmol/L (ref 3.5–5.2)
Sodium: 145 mmol/L — ABNORMAL HIGH (ref 134–144)

## 2016-05-21 ENCOUNTER — Telehealth: Payer: Self-pay

## 2016-05-21 NOTE — Telephone Encounter (Signed)
**Note De-Identified Ahnaf Caponi Obfuscation** The pt is advised that his pt assistance for Eliquis has been denied through [employer]. Reason listed: Product covered by insurance.

## 2016-05-30 ENCOUNTER — Telehealth: Payer: Self-pay | Admitting: Interventional Cardiology

## 2016-05-30 NOTE — Telephone Encounter (Signed)
Patient wife calling, states that she was advised to call back and report progress of new medication for swelling in patient legs. Mrs. Dewing states that there is not much difference or improvement. Please call to discuss, thanks.

## 2016-05-30 NOTE — Telephone Encounter (Signed)
Spoke with wife, DPR on file.  She states pt's legs are still swelling.  They don't "feel as hard" as they use to but do still swell.  Denies redness.  Pt is not urinating anymore than he was prior to starting HCTZ.  Swelling improves with elevation.  Spoke with Dr. Tamala Julian and he states pt needs to use bilateral knee high compression stockings d/t venous insufficiency.  Advised wife of recommendations.  Wife verbalized understanding and was in agreement with this plan.

## 2016-06-13 ENCOUNTER — Other Ambulatory Visit: Payer: Self-pay | Admitting: Interventional Cardiology

## 2016-07-02 ENCOUNTER — Telehealth (INDEPENDENT_AMBULATORY_CARE_PROVIDER_SITE_OTHER): Payer: Self-pay | Admitting: Physical Medicine and Rehabilitation

## 2016-07-02 NOTE — Telephone Encounter (Signed)
[facility] RECORDS FROM DR NEWTON FAXED TO DR [facility] OFFICE [phone]

## 2016-08-15 ENCOUNTER — Telehealth: Payer: Self-pay | Admitting: Interventional Cardiology

## 2016-08-15 NOTE — Telephone Encounter (Signed)
Spoke with pt and he states he has been having palpitations for 2 days.  States they are "light", having 2-3 episodes a day that are lasting 15-31mins.  Occasionally feels SOB and has had no energy.  Says he can occasionally feel his "heart beat hard".  Denies new meds, diet changes, lightheadedness, or dizziness.  Currently not having any symptoms.  Pt has not missed any doses of Eliquis.  Advised I would send message to Dr. Tamala Julian for review and advisement.

## 2016-08-15 NOTE — Telephone Encounter (Signed)
New message    Pt wife is calling about pt. She said pt is having mild palpitations.   Patient c/o Palpitations:  High priority if patient c/o lightheadedness and shortness of breath.  1. How long have you been having palpitations? 2 days  2. Are you currently experiencing lightheadedness and shortness of breath? Not right this minute, but has had some SOB. Per wife it comes and goes.  3. Have you checked your BP and heart rate? (document readings)pt checks bp three times a day. 5/31-130/74, 115/63, 116/68 6/1-158/80, 146/79, 136/89 6/3-149/70, 146/72, 140/75 6/5-118/66, 111/63, 137/76, 6/7-128/70, 119/70, 122/72   4. Are you experiencing any other symptoms? No.

## 2016-08-15 NOTE — Telephone Encounter (Signed)
If this continues, he will need to come in for EKG to rule out atrial fibrillation. If symptoms worsen he will need to go to the emergency room. He is on anticoagulation therapy so risk of stroke is controlled.

## 2016-08-18 NOTE — Telephone Encounter (Signed)
Spoke with pt and went over recommendations per Dr. Tamala Julian.  Pt states sx were better over the weekend.  Advised to call if increased palps return and when appropriate to go to ER.  Pt appreciative for call.

## 2016-09-16 ENCOUNTER — Other Ambulatory Visit: Payer: Self-pay | Admitting: Interventional Cardiology

## 2016-09-30 ENCOUNTER — Other Ambulatory Visit: Payer: Self-pay | Admitting: Family Medicine

## 2016-09-30 DIAGNOSIS — R109 Unspecified abdominal pain: Secondary | ICD-10-CM

## 2016-10-02 ENCOUNTER — Ambulatory Visit
Admission: RE | Admit: 2016-10-02 | Discharge: 2016-10-02 | Disposition: A | Discharge status: Home / Self Care | Payer: Medicare Other | Source: Ambulatory Visit | Attending: Family Medicine | Admitting: Family Medicine

## 2016-10-02 DIAGNOSIS — R109 Unspecified abdominal pain: Secondary | ICD-10-CM

## 2016-10-06 ENCOUNTER — Other Ambulatory Visit: Payer: Medicare Other

## 2016-10-15 ENCOUNTER — Other Ambulatory Visit (HOSPITAL_COMMUNITY): Payer: Self-pay | Admitting: Gastroenterology

## 2016-10-15 DIAGNOSIS — R1013 Epigastric pain: Secondary | ICD-10-CM

## 2016-10-15 DIAGNOSIS — R112 Nausea with vomiting, unspecified: Secondary | ICD-10-CM

## 2016-10-15 DIAGNOSIS — R634 Abnormal weight loss: Secondary | ICD-10-CM

## 2016-10-23 ENCOUNTER — Ambulatory Visit (HOSPITAL_COMMUNITY)
Admission: RE | Admit: 2016-10-23 | Discharge: 2016-10-23 | Disposition: A | Discharge status: Home / Self Care | Payer: Medicare Other | Source: Ambulatory Visit | Attending: Gastroenterology | Admitting: Gastroenterology

## 2016-10-23 DIAGNOSIS — R634 Abnormal weight loss: Secondary | ICD-10-CM | POA: Insufficient documentation

## 2016-10-23 DIAGNOSIS — R1013 Epigastric pain: Secondary | ICD-10-CM

## 2016-10-23 DIAGNOSIS — R112 Nausea with vomiting, unspecified: Secondary | ICD-10-CM

## 2016-10-23 MED ORDER — TECHNETIUM TC 99M MEBROFENIN IV KIT
5.4000 | PACK | Freq: Once | INTRAVENOUS | Status: AC | PRN
  Administered 2016-10-23: 5.4 via INTRAVENOUS

## 2016-10-26 NOTE — Progress Notes (Signed)
Cardiology Office Note    Date:  10/27/2016   ID:  [name] [name], [name] [name] [birth year], MRN [id number]  PCP:  Gaynelle Arabian, MD  Cardiologist: Sinclair Grooms, MD   Chief Complaint  Patient presents with  . Atrial Fibrillation    History of Present Illness:  [name] [name] is a 76 y.o. male with a history of paroxysmal atrial fibrillation, recurring chest discomfort, hypertension, and chronic kidney disease.  He needs to have Mohs surgery on his back. This will be performed by Dr. Ulice Dash. Loyal Jacobson at [facility] dermatology [address] [place], [place] [address]  He denies chest discomfort, dyspnea, and palpitations.  Past Medical History:  Diagnosis Date  . Angina    chest pain- cardiac cath. followed in 2010, record available ,  told then that he should f/u /w Dr. Marisue Humble    . Arthritis    R knee, back    . Asthma    uses  singulair  . Cancer [facility])    prostate, melanoma- 2010, excision    . Chronic kidney disease    prostate cancer - surg. removal- 1996  . Colon polyps   . Diverticulitis   . Dysrhythmia    palpitations, followed by Dr.Ehinger, seen in prep for surgery on 03/27/2011   . GERD (gastroesophageal reflux disease)   . Hepatitis    jaundice- many yrs. ago  . Hiatal hernia   . Hypertension   . 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 OSA (obstructive sleep apnea) 04/11/2015   Mild to moderate OSA with AHI 13.3/hr overall and AHI 36.8/hr during REM sleep.  Oxygen saturations were as low as 86% during respiratory events.  . Pneumonia    hosp. 20 yrs. ago  . Prostate cancer ([place]) 1995  . Sleep apnea    study done, 10 yrs. ago, told that he needed  CPAP but  never used     Past Surgical History:  Procedure Laterality Date  . CARDIAC CATHETERIZATION     2010  . CARDIAC CATHETERIZATION N/A 10/22/2015   Procedure: Left Heart Cath and Coronary Angiography;  Surgeon: Nelva Bush, MD;  Location: [place] CV LAB;  Service: Cardiovascular;  Laterality: N/A;  .  FRACTURE SURGERY     L wrist, hardware- 1982  . JOINT REPLACEMENT     L knee, 2009  . LEFT HEART CATHETERIZATION WITH CORONARY ANGIOGRAM N/A 09/08/2011   Procedure: LEFT HEART CATHETERIZATION WITH CORONARY ANGIOGRAM;  Surgeon: Sinclair Grooms, MD;  Location[facility] CATH LAB;  Service: Cardiovascular;  Laterality: N/A;  . PROSTATECTOMY     for ca  . TONSILLECTOMY     as child  . TOTAL KNEE ARTHROPLASTY  04/22/2011   Procedure: TOTAL KNEE ARTHROPLASTY;  Surgeon: Garald Balding, MD;  Location: [place];  Service: Orthopedics;  Laterality: Right;    Current Medications: Outpatient Medications Prior to Visit  Medication Sig Dispense Refill  . acetaminophen (TYLENOL) 325 MG tablet Take 2 tablets (650 mg total) by mouth every 4 (four) hours as needed for headache or mild pain.    Marland Kitchen albuterol (PROVENTIL HFA;VENTOLIN HFA) 108 (90 BASE) MCG/ACT inhaler Inhale 2 puffs into the lungs every 6 (six) hours as needed for wheezing or shortness of breath.    . budesonide-formoterol (SYMBICORT) 160-4.5 MCG/ACT inhaler Inhale 1 puff into the lungs 2 (two) times daily. 2 Inhaler 0  . Calcium Carbonate-Vitamin D (CALCIUM + D) 600-200 MG-UNIT TABS Take 1 tablet by mouth 2 (two)  times daily.    Marland Kitchen ELIQUIS 5 MG TABS tablet TAKE 1 TABLET BY MOUTH TWO  TIMES DAILY 180 tablet 1  . flecainide (TAMBOCOR) 50 MG tablet TAKE 1 TABLET BY MOUTH TWO  TIMES DAILY 180 tablet 1  . fluticasone (FLONASE) 50 MCG/ACT nasal spray Place 2 sprays into the nose at bedtime as needed for allergies.     . hydroxypropyl methylcellulose (ISOPTO TEARS) 2.5 % ophthalmic solution Place 1 drop into both eyes 2 (two) times daily as needed for dry eyes. For dry eyes    . montelukast (SINGULAIR) 10 MG tablet Take 10 mg by mouth daily before breakfast.     . Multiple Vitamins tablet Take 1 tablet by mouth daily.    . nitroGLYCERIN (NITROSTAT) 0.4 MG SL tablet Place 1 tablet (0.4 mg total) under the tongue every 5 (five) minutes x 3 doses as needed for  chest pain. 25 tablet 3  . Omega-3 Fatty Acids (FISH OIL) 1000 MG CAPS Take 1,000 mg by mouth 2 (two) times daily.    Marland Kitchen omeprazole (PRILOSEC) 40 MG capsule Take 40 mg by mouth at bedtime.     Marland Kitchen telmisartan (MICARDIS) 80 MG tablet Take 1 tablet (80 mg total) by mouth daily. 30 tablet 11  . hydrochlorothiazide (MICROZIDE) 12.5 MG capsule Take 1 capsule (12.5 mg total) by mouth daily. 30 capsule 1   No facility-administered medications prior to visit.      Allergies:   Aminophylline; Carvedilol; Lisinopril; Tenormin [atenolol]; and Zolpidem tartrate   Social History   Social History  . Marital status: Married    Spouse name: N/A  . Number of children: N/A  . Years of education: N/A   Social History Main Topics  . Smoking status: Former Smoker    Packs/day: 1.50    Years: 30.00    Types: Cigarettes    Quit date: 04/11/1983  . Smokeless tobacco: Never Used  . Alcohol use No  . Drug use: No  . Sexual activity: No   Other Topics Concern  . None   Social History Narrative  . None     Family History:  The patient's family history includes Alzheimer's disease in his father; Breast cancer in his mother; Crohn's disease in his brother; Heart disease in his brother; Hypertension in his brother.   ROS:   Please see the history of present illness.    No blood in the urine or stool. He denies neurological complaints. Occasional abdominal pain. Occasional nausea and vomiting.  All other systems reviewed and are negative.   PHYSICAL EXAM:   VS:  BP 136/78 (BP Location: Right Arm)   Pulse (!) 59   Ht 5\' 7"  (1.702 m)   Wt 247 lb 1.9 oz (112.1 kg)   BMI 38.70 kg/m    GEN: Well nourished, well developed, in no acute distress. Morbid obesity.  HEENT: normal  Neck: no JVD, carotid bruits, or masses Cardiac: RRR; no murmurs, rubs, or gallops,no edema  Respiratory:  clear to auscultation bilaterally, normal work of breathing GI: soft, nontender, nondistended, + BS MS: no deformity or  atrophy  Skin: warm and dry, no rash Neuro:  Alert and Oriented x 3, Strength and sensation are intact Psych: euthymic mood, full affect  Wt Readings from Last 3 Encounters:  10/27/16 247 lb 1.9 oz (112.1 kg)  05/02/16 257 lb 12.8 oz (116.9 kg)  04/23/16 258 lb (117 kg)      Studies/Labs Reviewed:   EKG:  EKG  Normal  sinus rhythm/sinus bradycardia. No abnormalities noted.  Recent Labs: 05/16/2016: BUN 15; Creatinine, Ser 1.33; Potassium 4.6; Sodium 145   Lipid Panel No results found for: CHOL, TRIG, HDL, CHOLHDL, VLDL, LDLCALC, LDLDIRECT  Additional studies/ records that were reviewed today include:  NONE    ASSESSMENT:    1. PAF (paroxysmal atrial fibrillation) ([place])   2. Essential hypertension   3. OSA (obstructive sleep apnea)   4. Chronic anticoagulation   5. Preop cardiovascular exam      PLAN:  In order of problems listed above:  1. Doing well. No clinical recurrences on flecainide. 2. Very well controlled on today's visit.  3. Continue C Pap. 4. Continue ELIQUIS, but discontinue 3 days prior to planned Mohs Surgery. Resume when safe per Dr. Jimmye Norman. 5. Cleared for upcoming Mohs surgery    Medication Adjustments/Labs and Tests Ordered: Current medicines are reviewed at length with the patient today.  Concerns regarding medicines are outlined above.  Medication changes, Labs and Tests ordered today are listed in the Patient Instructions below. Patient Instructions  Medication Instructions:  1) You may hold your Eliquis 3 days prior to your upcoming procedure  Labwork: None  Testing/Procedures: None  Follow-Up: Your physician wants you to follow-up in: 1 year with Dr. Tamala Julian.  You will receive a reminder letter in the mail two months in advance. If you don't receive a letter, please call our office to schedule the follow-up appointment.   Any Other Special Instructions Will Be Listed Below (If Applicable).     If you need a refill on your cardiac  medications before your next appointment, please call your pharmacy.      Signed, Sinclair Grooms, MD  10/27/2016 10:53 AM    [facility] [place], [place], [place]  [address] Phone: [phone]; Fax: [phone]

## 2016-10-27 ENCOUNTER — Encounter: Payer: Self-pay | Admitting: Interventional Cardiology

## 2016-10-27 ENCOUNTER — Ambulatory Visit (INDEPENDENT_AMBULATORY_CARE_PROVIDER_SITE_OTHER): Payer: Medicare Other | Admitting: Interventional Cardiology

## 2016-10-27 VITALS — BP 136/78 | HR 59 | Ht 67.0 in | Wt 247.1 lb

## 2016-10-27 DIAGNOSIS — G4733 Obstructive sleep apnea (adult) (pediatric): Secondary | ICD-10-CM

## 2016-10-27 DIAGNOSIS — Z7901 Long term (current) use of anticoagulants: Secondary | ICD-10-CM | POA: Diagnosis not present

## 2016-10-27 DIAGNOSIS — I10 Essential (primary) hypertension: Secondary | ICD-10-CM

## 2016-10-27 DIAGNOSIS — Z01810 Encounter for preprocedural cardiovascular examination: Secondary | ICD-10-CM

## 2016-10-27 DIAGNOSIS — I480 Paroxysmal atrial fibrillation: Secondary | ICD-10-CM | POA: Diagnosis not present

## 2016-10-27 NOTE — Patient Instructions (Signed)
Medication Instructions:  1) You may hold your Eliquis 3 days prior to your upcoming procedure  Labwork: None  Testing/Procedures: None  Follow-Up: Your physician wants you to follow-up in: 1 year with Dr. Tamala Julian.  You will receive a reminder letter in the mail two months in advance. If you don't receive a letter, please call our office to schedule the follow-up appointment.   Any Other Special Instructions Will Be Listed Below (If Applicable).     If you need a refill on your cardiac medications before your next appointment, please call your pharmacy.

## 2016-11-04 ENCOUNTER — Other Ambulatory Visit: Payer: Self-pay | Admitting: Interventional Cardiology

## 2017-01-09 ENCOUNTER — Telehealth: Payer: Self-pay

## 2017-01-09 NOTE — Telephone Encounter (Signed)
Faxed to Dr Lauris Poag office via [employer].

## 2017-01-09 NOTE — Telephone Encounter (Signed)
Patient with diagnosis of AF on Eliquis for anticoagulation.    Procedure: Spinal procedure Date of procedure: 01/13/17  CHADS2 score of 2 (CHF, HTN, AGE, DM2, stroke/tia x 2);  CHADS2-VASc score of  3 (CHF, HTN, AGE, DM2, stroke/tia x 2, CAD, AGE, male)  CrCl 74 mL/min  Per office protocol, patient can hold Eliquis for 3 days prior to procedure.

## 2017-01-09 NOTE — Telephone Encounter (Signed)
   [facility] Pre-operative Risk Assessment    Request for surgical clearance:  1. What type of surgery is being performed? RF ablation-lumbar    2. When is this surgery scheduled? 01/13/2017   3. Are there any medications that need to be held prior to surgery and how long? Eliquis for 3 days prior    4. Practice name and name of physician performing surgery? [facility]/ Dr. Marlaine Hind   5. What is your office phone and fax number?  Ph [phone]  Fx [phone]   6. Anesthesia type (None, local, MAC, general) ? Not stated   Tod Persia 01/09/2017, 8:28 AM  _________________________________________________________________   (provider comments below)

## 2017-01-15 ENCOUNTER — Other Ambulatory Visit: Payer: Self-pay | Admitting: Interventional Cardiology

## 2017-01-21 ENCOUNTER — Ambulatory Visit (INDEPENDENT_AMBULATORY_CARE_PROVIDER_SITE_OTHER): Payer: Medicare Other | Admitting: Pulmonary Disease

## 2017-01-21 ENCOUNTER — Encounter: Payer: Self-pay | Admitting: Pulmonary Disease

## 2017-01-21 VITALS — BP 134/72 | HR 65 | Ht 67.0 in | Wt 250.0 lb

## 2017-01-21 DIAGNOSIS — R911 Solitary pulmonary nodule: Secondary | ICD-10-CM | POA: Diagnosis not present

## 2017-01-21 DIAGNOSIS — J45901 Unspecified asthma with (acute) exacerbation: Secondary | ICD-10-CM

## 2017-01-21 DIAGNOSIS — K219 Gastro-esophageal reflux disease without esophagitis: Secondary | ICD-10-CM

## 2017-01-21 DIAGNOSIS — R05 Cough: Secondary | ICD-10-CM

## 2017-01-21 DIAGNOSIS — J441 Chronic obstructive pulmonary disease with (acute) exacerbation: Secondary | ICD-10-CM

## 2017-01-21 DIAGNOSIS — R059 Cough, unspecified: Secondary | ICD-10-CM

## 2017-01-21 DIAGNOSIS — J301 Allergic rhinitis due to pollen: Secondary | ICD-10-CM

## 2017-01-21 NOTE — Patient Instructions (Addendum)
Cough: I think this is most likely due to post nasal drip from allergic rhinitis and irritated vocal cords You need to try to suppress your cough to allow your larynx (voice box) to heal.  For three days don't talk, laugh, sing, or clear your throat. Do everything you can to suppress the cough during this time. Use hard candies (sugarless Jolly Ranchers) or non-mint or non-menthol containing cough drops during this time to soothe your throat.  Use a cough suppressant (Delsym or what I have prescribed you) around the clock during this time.  After three days, gradually increase the use of your voice and back off on the cough suppressants.  Allergic rhinitis: Use Neil Med rinses with distilled water at least twice per day using the instructions on the package. 1/2 hour after using the [facility] Med rinse, use Nasacort two puffs in each nostril once per day.  Remember that the Nasacort can take 1-2 weeks to work after regular use. Use generic zyrtec (cetirizine) every day.  If this doesn't help, then stop taking it and use chlorpheniramine-phenylephrine combination tablets.  Asthma: Keep taking Symbicort as you are doing  GERD: Take the prilosec as you are doing Follow the GERD diet we gave you  Let me know if the cough is not better in 4 weeks, otherwise I will see you back in 4-6 months or sooner if needed

## 2017-01-21 NOTE — Progress Notes (Signed)
Subjective:    Patient ID: [name] [name], male    DOB: 04/03/1940, 76 y.o.   MRN: [id number]  Synopsis: Referred in 2016 for dyspnea due to asthma based on PFT showing reversible airflow obstruction. He quit smoking around 1975; he smoked for about 30-35 years 1.5 ppd. Childhood polio, was in an iron lung  HPI Chief Complaint  Patient presents with  . Follow-up    pt states he is doing well, does note stable prod cough.     He says that he coughs a lot.  Sometimes he will cough up mucus.  He feels like the cough just won't go away.  He says that he feels like the cough is worse night.  Lying flat makes it worse.  Eating a meal doesn't seem to correlate make it worse.  Talking a lot will make the cough worse.  He says that he will get some hoarseness.  He feels that he can't get words out sometimes.  In the mornings he has a lot of mucus in his notes, feels stopped up.  No heartburn, no indigestion.  He takes Prilosec and sleeps sitting up in a recliner.    He still uses his Symbicort.  No attacks of asthma necessitating a visit to his MD.  No recent prednisone for a lung problem.    Past Medical History:  Diagnosis Date  . Angina    chest pain- cardiac cath. followed in 2010, record available ,  told then that he should f/u /w Dr. Marisue [name]    . Arthritis    R knee, back    . Asthma    uses  singulair  . Cancer [facility])    prostate, melanoma- 2010, excision    . Chronic kidney disease    prostate cancer - surg. removal- 1996  . Colon polyps   . Diverticulitis   . Dysrhythmia    palpitations, followed by [name] [name], seen in prep for surgery on 03/27/2011   . GERD (gastroesophageal reflux disease)   . Hepatitis    jaundice- many yrs. ago  . Hiatal hernia   . Hypertension   . Melanoma ([place])    Face  . OSA (obstructive sleep apnea) 04/11/2015   Mild to moderate OSA with AHI 13.3/hr overall and AHI 36.8/hr during REM sleep.  Oxygen saturations were as low as 86% during respiratory  events.  . Pneumonia    hosp. 20 yrs. ago  . Prostate cancer ([place]) 1995  . Sleep apnea    study done, 10 yrs. ago, told that he needed  CPAP but  never used       Review of Systems  Constitutional: Negative for chills, fatigue and fever.  HENT: Negative for nosebleeds, postnasal drip and rhinorrhea.   Respiratory: Negative for cough, shortness of breath and wheezing.   Cardiovascular: Negative for chest pain, palpitations and leg swelling.       Objective:   Physical Exam Vitals:   01/21/17 1146  BP: 134/72  Pulse: 65  SpO2: 98%  Weight: 250 lb (113.4 kg)  Height: 5\' 7"  (1.702 m)   RA  Gen: well appearing HENT: OP clear, TM's clear, neck supple PULM: CTA B, upper airway cough, normal percussion CV: RRR, no mgr, trace edema GI: BS+, soft, nontender Derm: no cyanosis or rash Psyche: normal mood and affect      Echo November 2016 echocardiogram LVEF 60-65%, moderate to severe left atrial enlargement, normal RV size and function  Imaging November 2016 chest  x-ray mild atelectasis left base, likely chronic otherwise normal, cardiomegaly noted 05/2015 CT chest [name] [name]) . Lungs: Groundglass density right upper lobe 14 x 7 mm previously 13 x 7 mm. Image 44 series 4. Linear densities left base no change. 11/2015 CT chest [name] [name]: 14x110mm RUL nodules is stable.  No lymphedenopathy 10/08/2016 CT chest [name] [name]: 14x8mm RUL ground glass nodule unchanged.  Lung function testing May 2015 PFT> pre-albuterol ratio 65%, FEV1 2.26 L (83% predicted), post albuterol FEV1 2.65 L (17% predicted, 400 mL change), total lung capacity 6.40 L (99% predicted) disease, DLCO 28.25 (99% pred)      Assessment & Plan:    Asthma, chronic obstructive, with acute exacerbation (HCC)  Solitary pulmonary nodule  Cough  Gastroesophageal reflux disease, esophagitis presence not specified  Allergic rhinitis due to pollen, unspecified seasonality  Discussion: From an asthma standpoint [name] [name] has  been doing well.  He has not had an exacerbation nor does he report any increasing cough or wheezing.  His nodule has been stable on the most recent CT scan of his chest.  However, he does complain of a persistent cough which may be a bit worse.  Given the fact that it is worse when he lies flat it makes me worried that this could be related to either postnasal drip (he notes nasal congestion in the mornings) or gastroesophageal reflux disease.  I think the most appropriate next step is to treat the allergic rhinitis first, if the cough does not go away then we should bring him back and perform exhaled nitric oxide testing and consider an esophageal pH probe.  Plan: Cough: I think this is most likely due to post nasal drip from allergic rhinitis and irritated vocal cords You need to try to suppress your cough to allow your larynx (voice box) to heal.  For three days don't talk, laugh, sing, or clear your throat. Do everything you can to suppress the cough during this time. Use hard candies (sugarless Jolly Ranchers) or non-mint or non-menthol containing cough drops during this time to soothe your throat.  Use a cough suppressant (Delsym or what I have prescribed you) around the clock during this time.  After three days, gradually increase the use of your voice and back off on the cough suppressants.  Allergic rhinitis: Use Neil Med rinses with distilled water at least twice per day using the instructions on the package. 1/2 hour after using the [facility] Med rinse, use Nasacort two puffs in each nostril once per day.  Remember that the Nasacort can take 1-2 weeks to work after regular use. Use generic zyrtec (cetirizine) every day.  If this doesn't help, then stop taking it and use chlorpheniramine-phenylephrine combination tablets.  Asthma: Keep taking Symbicort as you are doing  GERD: Take the prilosec as you are doing Follow the GERD diet we gave you  Let me know if the cough is not better in 4  weeks, otherwise I will see you back in 4-6 months or sooner if needed    Current Outpatient Medications:  .  acetaminophen (TYLENOL) 325 MG tablet, Take 2 tablets (650 mg total) by mouth every 4 (four) hours as needed for headache or mild pain., Disp: , Rfl:  .  albuterol (PROVENTIL HFA;VENTOLIN HFA) 108 (90 BASE) MCG/ACT inhaler, Inhale 2 puffs into the lungs every 6 (six) hours as needed for wheezing or shortness of breath., Disp: , Rfl:  .  budesonide-formoterol (SYMBICORT) 160-4.5 MCG/ACT inhaler, Inhale 1 puff into the lungs 2 (two)  times daily., Disp: 2 Inhaler, Rfl: 0 .  Calcium Carbonate-Vitamin D (CALCIUM + D) 600-200 MG-UNIT TABS, Take 1 tablet by mouth 2 (two) times daily., Disp: , Rfl:  .  ELIQUIS 5 MG TABS tablet, TAKE 1 TABLET BY MOUTH TWO  TIMES DAILY, Disp: 180 tablet, Rfl: 3 .  flecainide (TAMBOCOR) 50 MG tablet, TAKE 1 TABLET BY MOUTH TWO  TIMES DAILY, Disp: 180 tablet, Rfl: 1 .  fluticasone (FLONASE) 50 MCG/ACT nasal spray, Place 2 sprays into the nose at bedtime as needed for allergies. , Disp: , Rfl:  .  hydrochlorothiazide (MICROZIDE) 12.5 MG capsule, Take 12.5 mg by mouth daily., Disp: , Rfl:  .  hydroxypropyl methylcellulose (ISOPTO TEARS) 2.5 % ophthalmic solution, Place 1 drop into both eyes 2 (two) times daily as needed for dry eyes. For dry eyes, Disp: , Rfl:  .  LORazepam (ATIVAN) 0.5 MG tablet, Take 0.5 mg by mouth 2 (two) times daily as needed for anxiety., Disp: , Rfl:  .  montelukast (SINGULAIR) 10 MG tablet, Take 10 mg by mouth daily before breakfast. , Disp: , Rfl:  .  Multiple Vitamins tablet, Take 1 tablet by mouth daily., Disp: , Rfl:  .  nitroGLYCERIN (NITROSTAT) 0.4 MG SL tablet, Place 1 tablet (0.4 mg total) under the tongue every 5 (five) minutes x 3 doses as needed for chest pain., Disp: 25 tablet, Rfl: 3 .  Omega-3 Fatty Acids (FISH OIL) 1000 MG CAPS, Take 1,000 mg by mouth 2 (two) times daily., Disp: , Rfl:  .  omeprazole (PRILOSEC) 40 MG capsule, Take  40 mg by mouth at bedtime. , Disp: , Rfl:  .  telmisartan (MICARDIS) 80 MG tablet, Take 1 tablet (80 mg total) by mouth daily., Disp: 30 tablet, Rfl: 11

## 2017-02-06 ENCOUNTER — Other Ambulatory Visit: Payer: Self-pay | Admitting: Interventional Cardiology

## 2017-02-23 ENCOUNTER — Telehealth: Payer: Self-pay | Admitting: Pulmonary Disease

## 2017-02-23 MED ORDER — PREDNISONE 10 MG PO TABS: ORAL_TABLET | ORAL | 0 refills | Status: DC

## 2017-02-23 NOTE — Telephone Encounter (Signed)
Spoke with pt. States that he is not feeling well. Reports wheezing, SOB, coughing and chest tightness. Cough is producing clear mucus. Denies fever, sweats or chills. Recently went to urgent care and was put on an antibiotic, he can't remember the name. He would like further recommendations.  MR - please advise. Thanks.

## 2017-02-23 NOTE — Telephone Encounter (Signed)
Ae-asthma  Plan contnue antibiotic of ER - but we need to know name  +  Take prednisone 40 mg daily x 2 days, then 20mg  daily x 2 days, then 10mg  daily x 2 days, then 5mg  daily x 2 days and stop   + go to ER if worse  Dr. Brand Males, M.D., [facility].C.P Pulmonary and Critical Care Medicine Staff Physician, [place] Director - Interstitial Lung Disease  Program  Pulmonary [place] at [place], [place], [address]  Pager: [phone], If no answer or between  15:00h - 7:00h: call [phone] Telephone: [phone]

## 2017-02-23 NOTE — Telephone Encounter (Signed)
Spoke with pt, aware of recs.  rx called in to preferred pharmacy.  Nothing further needed.

## 2017-02-27 ENCOUNTER — Emergency Department (HOSPITAL_COMMUNITY): Payer: Medicare Other

## 2017-02-27 ENCOUNTER — Other Ambulatory Visit: Payer: Self-pay

## 2017-02-27 ENCOUNTER — Inpatient Hospital Stay (HOSPITAL_COMMUNITY): Payer: Medicare Other

## 2017-02-27 ENCOUNTER — Inpatient Hospital Stay (HOSPITAL_COMMUNITY)
Admission: EM | Admit: 2017-02-27 | Discharge: 2017-03-04 | DRG: 193 | Disposition: A | Discharge status: Home / Self Care | Payer: Medicare Other | Attending: Nephrology | Admitting: Nephrology

## 2017-02-27 ENCOUNTER — Encounter (HOSPITAL_COMMUNITY): Payer: Self-pay

## 2017-02-27 ENCOUNTER — Telehealth: Payer: Self-pay | Admitting: Pulmonary Disease

## 2017-02-27 DIAGNOSIS — J181 Lobar pneumonia, unspecified organism: Secondary | ICD-10-CM | POA: Diagnosis not present

## 2017-02-27 DIAGNOSIS — J441 Chronic obstructive pulmonary disease with (acute) exacerbation: Secondary | ICD-10-CM | POA: Diagnosis present

## 2017-02-27 DIAGNOSIS — K219 Gastro-esophageal reflux disease without esophagitis: Secondary | ICD-10-CM | POA: Diagnosis present

## 2017-02-27 DIAGNOSIS — Z87891 Personal history of nicotine dependence: Secondary | ICD-10-CM | POA: Diagnosis not present

## 2017-02-27 DIAGNOSIS — Z96651 Presence of right artificial knee joint: Secondary | ICD-10-CM | POA: Diagnosis present

## 2017-02-27 DIAGNOSIS — Z7951 Long term (current) use of inhaled steroids: Secondary | ICD-10-CM | POA: Diagnosis not present

## 2017-02-27 DIAGNOSIS — Z8546 Personal history of malignant neoplasm of prostate: Secondary | ICD-10-CM

## 2017-02-27 DIAGNOSIS — Z79899 Other long term (current) drug therapy: Secondary | ICD-10-CM | POA: Diagnosis not present

## 2017-02-27 DIAGNOSIS — J9601 Acute respiratory failure with hypoxia: Secondary | ICD-10-CM | POA: Diagnosis present

## 2017-02-27 DIAGNOSIS — Z7901 Long term (current) use of anticoagulants: Secondary | ICD-10-CM | POA: Diagnosis not present

## 2017-02-27 DIAGNOSIS — I480 Paroxysmal atrial fibrillation: Secondary | ICD-10-CM | POA: Diagnosis present

## 2017-02-27 DIAGNOSIS — K5900 Constipation, unspecified: Secondary | ICD-10-CM

## 2017-02-27 DIAGNOSIS — F419 Anxiety disorder, unspecified: Secondary | ICD-10-CM | POA: Diagnosis present

## 2017-02-27 DIAGNOSIS — I10 Essential (primary) hypertension: Secondary | ICD-10-CM | POA: Diagnosis not present

## 2017-02-27 DIAGNOSIS — Z85820 Personal history of malignant melanoma of skin: Secondary | ICD-10-CM | POA: Diagnosis not present

## 2017-02-27 DIAGNOSIS — G4733 Obstructive sleep apnea (adult) (pediatric): Secondary | ICD-10-CM | POA: Diagnosis present

## 2017-02-27 DIAGNOSIS — N183 Chronic kidney disease, stage 3 unspecified: Secondary | ICD-10-CM | POA: Diagnosis present

## 2017-02-27 DIAGNOSIS — R0602 Shortness of breath: Secondary | ICD-10-CM

## 2017-02-27 DIAGNOSIS — B974 Respiratory syncytial virus as the cause of diseases classified elsewhere: Secondary | ICD-10-CM | POA: Diagnosis present

## 2017-02-27 DIAGNOSIS — J9621 Acute and chronic respiratory failure with hypoxia: Secondary | ICD-10-CM | POA: Diagnosis present

## 2017-02-27 DIAGNOSIS — Z8249 Family history of ischemic heart disease and other diseases of the circulatory system: Secondary | ICD-10-CM | POA: Diagnosis not present

## 2017-02-27 DIAGNOSIS — J440 Chronic obstructive pulmonary disease with acute lower respiratory infection: Secondary | ICD-10-CM | POA: Diagnosis present

## 2017-02-27 DIAGNOSIS — I129 Hypertensive chronic kidney disease with stage 1 through stage 4 chronic kidney disease, or unspecified chronic kidney disease: Secondary | ICD-10-CM | POA: Diagnosis present

## 2017-02-27 DIAGNOSIS — R079 Chest pain, unspecified: Secondary | ICD-10-CM | POA: Diagnosis present

## 2017-02-27 DIAGNOSIS — J45909 Unspecified asthma, uncomplicated: Secondary | ICD-10-CM | POA: Diagnosis present

## 2017-02-27 LAB — BASIC METABOLIC PANEL
ANION GAP: 9 (ref 5–15)
BUN: 15 mg/dL (ref 6–20)
CALCIUM: 9.2 mg/dL (ref 8.9–10.3)
CO2: 28 mmol/L (ref 22–32)
Chloride: 102 mmol/L (ref 101–111)
Creatinine, Ser: 1.2 mg/dL (ref 0.61–1.24)
GFR calc Af Amer: >60 mL/min (ref >60)
GFR calc non Af Amer: 56 mL/min — ABNORMAL LOW (ref >60)
GLUCOSE: 127 mg/dL — AB (ref 65–99)
Potassium: 3.6 mmol/L (ref 3.5–5.1)
Sodium: 139 mmol/L (ref 135–145)

## 2017-02-27 LAB — TROPONIN I

## 2017-02-27 LAB — INFLUENZA PANEL BY PCR (TYPE A & B)
Influenza A By PCR: NEGATIVE
Influenza B By PCR: NEGATIVE

## 2017-02-27 LAB — CBC
HCT: 41.1 % (ref 39.0–52.0)
HEMOGLOBIN: 14.3 g/dL (ref 13.0–17.0)
MCH: 31.2 pg (ref 26.0–34.0)
MCHC: 34.8 g/dL (ref 30.0–36.0)
MCV: 89.7 fL (ref 78.0–100.0)
Platelets: 165 10*3/uL (ref 150–400)
RBC: 4.58 MIL/uL (ref 4.22–5.81)
RDW: 13.1 % (ref 11.5–15.5)
WBC: 7.3 10*3/uL (ref 4.0–10.5)

## 2017-02-27 LAB — RESPIRATORY PANEL BY PCR
ADENOVIRUS-RVPPCR: NOT DETECTED
Bordetella pertussis: NOT DETECTED
CHLAMYDOPHILA PNEUMONIAE-RVPPCR: NOT DETECTED
CORONAVIRUS NL63-RVPPCR: NOT DETECTED
CORONAVIRUS OC43-RVPPCR: NOT DETECTED
Coronavirus 229E: NOT DETECTED
Coronavirus HKU1: NOT DETECTED
INFLUENZA A-RVPPCR: NOT DETECTED
Influenza B: NOT DETECTED
Metapneumovirus: NOT DETECTED
Mycoplasma pneumoniae: NOT DETECTED
PARAINFLUENZA VIRUS 1-RVPPCR: NOT DETECTED
PARAINFLUENZA VIRUS 3-RVPPCR: NOT DETECTED
PARAINFLUENZA VIRUS 4-RVPPCR: NOT DETECTED
Parainfluenza Virus 2: NOT DETECTED
RESPIRATORY SYNCYTIAL VIRUS-RVPPCR: DETECTED — AB
RHINOVIRUS / ENTEROVIRUS - RVPPCR: NOT DETECTED

## 2017-02-27 LAB — I-STAT TROPONIN, ED: TROPONIN I, POC: 0 ng/mL (ref 0.00–0.08)

## 2017-02-27 MED ORDER — DEXTROSE 5 % IV SOLN
1.0000 g | Freq: Once | INTRAVENOUS | Status: AC
  Administered 2017-02-27: 1 g via INTRAVENOUS
  Filled 2017-02-27 (×2): qty 10

## 2017-02-27 MED ORDER — SODIUM CHLORIDE 0.9 % IV SOLN
INTRAVENOUS | Status: DC
  Administered 2017-02-27 – 2017-02-28 (×2): via INTRAVENOUS

## 2017-02-27 MED ORDER — PREDNISONE 20 MG PO TABS
60.0000 mg | ORAL_TABLET | Freq: Once | ORAL | Status: AC
  Administered 2017-02-27: 60 mg via ORAL
  Filled 2017-02-27: qty 3

## 2017-02-27 MED ORDER — DOXYCYCLINE HYCLATE 100 MG PO TABS
100.0000 mg | ORAL_TABLET | Freq: Two times a day (BID) | ORAL | Status: DC
  Administered 2017-02-27 – 2017-03-04 (×10): 100 mg via ORAL
  Filled 2017-02-27 (×10): qty 1

## 2017-02-27 MED ORDER — FLECAINIDE ACETATE 50 MG PO TABS
50.0000 mg | ORAL_TABLET | Freq: Two times a day (BID) | ORAL | Status: DC
  Administered 2017-02-27 – 2017-03-04 (×10): 50 mg via ORAL
  Filled 2017-02-27 (×10): qty 1

## 2017-02-27 MED ORDER — ALBUTEROL SULFATE (2.5 MG/3ML) 0.083% IN NEBU: 2.5000 mg | INHALATION_SOLUTION | RESPIRATORY_TRACT | Status: DC | PRN

## 2017-02-27 MED ORDER — DOXYCYCLINE HYCLATE 100 MG IV SOLR
100.0000 mg | Freq: Once | INTRAVENOUS | Status: AC
  Administered 2017-02-27: 100 mg via INTRAVENOUS
  Filled 2017-02-27: qty 100

## 2017-02-27 MED ORDER — MORPHINE SULFATE (PF) 4 MG/ML IV SOLN: 2.0000 mg | INTRAVENOUS | Status: DC | PRN

## 2017-02-27 MED ORDER — DEXTROSE 5 % IV SOLN
1.0000 g | INTRAVENOUS | Status: DC
  Administered 2017-02-28 – 2017-03-03 (×4): 1 g via INTRAVENOUS
  Filled 2017-02-27 (×5): qty 10

## 2017-02-27 MED ORDER — IPRATROPIUM-ALBUTEROL 0.5-2.5 (3) MG/3ML IN SOLN
3.0000 mL | Freq: Once | RESPIRATORY_TRACT | Status: AC
  Administered 2017-02-27: 3 mL via RESPIRATORY_TRACT
  Filled 2017-02-27: qty 3

## 2017-02-27 MED ORDER — POLYVINYL ALCOHOL 1.4 % OP SOLN
1.0000 [drp] | Freq: Two times a day (BID) | OPHTHALMIC | Status: DC | PRN
  Filled 2017-02-27: qty 15

## 2017-02-27 MED ORDER — ONDANSETRON HCL 4 MG/2ML IJ SOLN: 4.0000 mg | Freq: Three times a day (TID) | INTRAMUSCULAR | Status: DC | PRN

## 2017-02-27 MED ORDER — NITROGLYCERIN 0.4 MG SL SUBL: 0.4000 mg | SUBLINGUAL_TABLET | SUBLINGUAL | Status: DC | PRN

## 2017-02-27 MED ORDER — DEXTROSE 5 % IV SOLN: 100.0000 mg | Freq: Once | INTRAVENOUS | Status: DC

## 2017-02-27 MED ORDER — LORAZEPAM 0.5 MG PO TABS: 0.5000 mg | ORAL_TABLET | Freq: Two times a day (BID) | ORAL | Status: DC | PRN

## 2017-02-27 MED ORDER — HYDRALAZINE HCL 20 MG/ML IJ SOLN: 5.0000 mg | INTRAMUSCULAR | Status: DC | PRN

## 2017-02-27 MED ORDER — APIXABAN 5 MG PO TABS
5.0000 mg | ORAL_TABLET | Freq: Two times a day (BID) | ORAL | Status: DC
  Administered 2017-02-27 – 2017-03-04 (×10): 5 mg via ORAL
  Filled 2017-02-27 (×10): qty 1

## 2017-02-27 MED ORDER — DM-GUAIFENESIN ER 30-600 MG PO TB12
1.0000 | ORAL_TABLET | Freq: Two times a day (BID) | ORAL | Status: DC | PRN
  Administered 2017-02-27 – 2017-02-28 (×2): 1 via ORAL
  Filled 2017-02-27 (×3): qty 1

## 2017-02-27 MED ORDER — CALCIUM CARBONATE-VITAMIN D 500-200 MG-UNIT PO TABS
1.0000 | ORAL_TABLET | Freq: Two times a day (BID) | ORAL | Status: DC
  Administered 2017-02-27: 21:00:00 1 via ORAL
  Filled 2017-02-27 (×3): qty 1

## 2017-02-27 MED ORDER — OMEGA-3-ACID ETHYL ESTERS 1 G PO CAPS
1.0000 g | ORAL_CAPSULE | Freq: Every day | ORAL | Status: DC
  Administered 2017-02-28 – 2017-03-04 (×5): 1 g via ORAL
  Filled 2017-02-27 (×5): qty 1

## 2017-02-27 MED ORDER — ADULT MULTIVITAMIN W/MINERALS CH
1.0000 | ORAL_TABLET | Freq: Every day | ORAL | Status: DC
  Administered 2017-02-28 – 2017-03-04 (×5): 1 via ORAL
  Filled 2017-02-27 (×5): qty 1

## 2017-02-27 MED ORDER — MONTELUKAST SODIUM 10 MG PO TABS
10.0000 mg | ORAL_TABLET | Freq: Every day | ORAL | Status: DC
  Administered 2017-02-28 – 2017-03-04 (×5): 10 mg via ORAL
  Filled 2017-02-27 (×4): qty 1

## 2017-02-27 MED ORDER — METHYLPREDNISOLONE SODIUM SUCC 125 MG IJ SOLR
60.0000 mg | Freq: Three times a day (TID) | INTRAMUSCULAR | Status: DC
  Administered 2017-02-27 – 2017-03-03 (×12): 60 mg via INTRAVENOUS
  Filled 2017-02-27 (×12): qty 2

## 2017-02-27 MED ORDER — ACETAMINOPHEN 325 MG PO TABS
650.0000 mg | ORAL_TABLET | Freq: Four times a day (QID) | ORAL | Status: DC | PRN
  Administered 2017-02-28 – 2017-03-03 (×4): 650 mg via ORAL
  Filled 2017-02-27 (×4): qty 2

## 2017-02-27 MED ORDER — IRBESARTAN 300 MG PO TABS
300.0000 mg | ORAL_TABLET | Freq: Every day | ORAL | Status: DC
  Administered 2017-02-28 – 2017-03-04 (×5): 300 mg via ORAL
  Filled 2017-02-27 (×5): qty 1

## 2017-02-27 MED ORDER — AMOXICILLIN-POT CLAVULANATE 875-125 MG PO TABS: 1.0000 | ORAL_TABLET | Freq: Once | ORAL | Status: DC

## 2017-02-27 MED ORDER — IPRATROPIUM-ALBUTEROL 0.5-2.5 (3) MG/3ML IN SOLN
3.0000 mL | RESPIRATORY_TRACT | Status: DC
  Administered 2017-02-27 – 2017-03-03 (×26): 3 mL via RESPIRATORY_TRACT
  Filled 2017-02-27 (×26): qty 3

## 2017-02-27 MED ORDER — PANTOPRAZOLE SODIUM 40 MG PO TBEC
40.0000 mg | DELAYED_RELEASE_TABLET | Freq: Every day | ORAL | Status: DC
  Administered 2017-02-27 – 2017-03-04 (×6): 40 mg via ORAL
  Filled 2017-02-27 (×6): qty 1

## 2017-02-27 NOTE — Telephone Encounter (Signed)
Spoke with pt's wife, Fredderick Severance. States that pt is not feeling well. Reports still having issues with SOB, wheezing, chest tightness and coughing. The pt has had several appointments with urgent care and his PCP. He has several rounds of prednisone, antibiotics and steroid injections with no relief. I advised the pt's wife that he would need an appointment but we do not have any available today. I have recommended that she take him to an ED. She agreed and verbalized understanding. Nothing further was needed.

## 2017-02-27 NOTE — ED Notes (Signed)
Assigned 1403 @ 15:39 call report @ 15:59

## 2017-02-27 NOTE — Progress Notes (Signed)
CRITICAL VALUE ALERT  Critical Value:  + RSV   Date & Time Notied:  02/27/17 1930  Provider Notified: Kennon Holter  Orders Received/Actions taken: Droplet precautions initiated

## 2017-02-27 NOTE — ED Triage Notes (Signed)
Chest pain that started last night that radiates to left shoulder. Recently placed on steroids and antibiotics for viral infection, symptoms have gotten worse. (+) SHOB, cough, nausea/vomiting, dizziness and headache.

## 2017-02-27 NOTE — ED Notes (Signed)
ED Provider at bedside. 

## 2017-02-27 NOTE — ED Notes (Signed)
Oxygen applied via nasal cannula @ 2 liters.

## 2017-02-27 NOTE — H&P (Signed)
History and Physical    [name] [name] DXI:[id number] DOB: [birth year] DOA: 02/27/2017  Referring MD/NP/PA:   PCP: Gaynelle Arabian, MD   Patient coming from:  The patient is coming from home.  At baseline, pt is independent for most of ADL.  Chief Complaint: Cough, shortness of breath, wheezing, chest pain  HPI: [name] [name] is a 76 y.o. male with medical history significant of COPD, asthma, CKD-III, hypertension, gerd, anxiety, prostate cancer, OSA not on CPAP, diverticulitis, A fib on Eliquis, obesity, who presents with cough, shortness breath, wheezing or chest pain.  Patient states that he has been having cough, shortness of breath for 3 weeks. He was seen by PCP, and finished 1 course of antibiotics without help, and started Levaquin in the past to 3 days, still without any improvement.  He coughs up yellow colored sputum.  He also has wheezing, runny nose, no sore throat.  No fever or chills.  States that he does not have diarrhea or abdominal pain.  He had nausea and vomited once yesterday when he had severe cough.  States that he has been having intermittent chest pain, which is locaetd at substernal area, radiating to the left shoulder.  It is not pleuritic. No tenderness in the calf area. No symptoms of UTI or unilateral weakness.  ED Course: pt was found to have WBC 7.3, negative troponin, stable renal function, temperature normal, no tachycardia, no tachypnea, oxygen saturation 92-99% on 2 L nasal cannula oxygen.  Chest x-ray showed left lower lobe infiltration.  Patient is admitted to telemetry bed as inpatient   Review of Systems:   General: no fevers, chills, no body weight gain, has poor appetite, has fatigue HEENT: no blurry vision, hearing changes or sore throat Respiratory: has dyspnea, coughing, wheezing CV: has chest pain, no palpitations GI: has nausea, vomiting,no abdominal pain, diarrhea, constipation GU: no dysuria, burning on urination, increased urinary  frequency, hematuria  Ext: 1+ leg edema Neuro: no unilateral weakness, numbness, or tingling, no vision change or hearing loss Skin: no rash, no skin tear. MSK: No muscle spasm, no deformity, no limitation of range of movement in spin Heme: No easy bruising.  Travel history: No recent long distant travel.  Allergy:  Allergies  Allergen Reactions  . Aminophylline   . Carvedilol     SOB/Fatigue  . Lisinopril Cough  . Tenormin [Atenolol] Other (See Comments)    Fatigue, low HR  . Zolpidem Tartrate Other (See Comments)    hallucinations    Past Medical History:  Diagnosis Date  . Angina    chest pain- cardiac cath. followed in 2010, record available ,  told then that he should f/u /w Dr. Marisue Humble    . Arthritis    R knee, back    . Asthma    uses  singulair  . Cancer [facility])    prostate, melanoma- 2010, excision    . Chronic kidney disease    prostate cancer - surg. removal- 1996  . Colon polyps   . Diverticulitis   . Dysrhythmia    palpitations, followed by Dr.Ehinger, seen in prep for surgery on 03/27/2011   . GERD (gastroesophageal reflux disease)   . Hepatitis    jaundice- many yrs. ago  . Hiatal hernia   . Hypertension   . 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 OSA (obstructive sleep apnea) 04/11/2015   Mild to moderate OSA with AHI 13.3/hr overall and AHI 36.8/hr during REM sleep.  Oxygen saturations were as  low as 86% during respiratory events.  . Pneumonia    hosp. 20 yrs. ago  . Prostate cancer ([place]) 1995  . Sleep apnea    study done, 10 yrs. ago, told that he needed  CPAP but  never used     Past Surgical History:  Procedure Laterality Date  . CARDIAC CATHETERIZATION     2010  . CARDIAC CATHETERIZATION N/A 10/22/2015   Procedure: Left Heart Cath and Coronary Angiography;  Surgeon: Nelva Bush, MD;  Location: [place] CV LAB;  Service: Cardiovascular;  Laterality: N/A;  . FRACTURE SURGERY     L wrist, hardware- 1982  . JOINT REPLACEMENT     L knee, 2009  . LEFT  HEART CATHETERIZATION WITH CORONARY ANGIOGRAM N/A 09/08/2011   Procedure: LEFT HEART CATHETERIZATION WITH CORONARY ANGIOGRAM;  Surgeon: Sinclair Grooms, MD;  Location: [facility] CATH LAB;  Service: Cardiovascular;  Laterality: N/A;  . PROSTATECTOMY     for ca  . TONSILLECTOMY     as child  . TOTAL KNEE ARTHROPLASTY  04/22/2011   Procedure: TOTAL KNEE ARTHROPLASTY;  Surgeon: Garald Balding, MD;  Location: [facility];  Service: Orthopedics;  Laterality: Right;    Social History:  reports that he quit smoking about 33 years ago. His smoking use included cigarettes. He has a 45.00 pack-year smoking history. he has never used smokeless tobacco. He reports that he does not drink alcohol or use drugs.  Family History:  Family History  Problem Relation Age of Onset  . Crohn's disease Brother   . Breast cancer Mother   . Alzheimer's disease Father   . Heart disease Brother   . Hypertension Brother   . Anesthesia problems Neg Hx   . Hypotension Neg Hx   . Malignant hyperthermia Neg Hx   . Pseudochol deficiency Neg Hx      Prior to Admission medications   Medication Sig Start Date End Date Taking? Authorizing Provider  acetaminophen (TYLENOL) 325 MG tablet Take 2 tablets (650 mg total) by mouth every 4 (four) hours as needed for headache or mild pain. 03/18/15  Yes Kilroy, Luke K, PA-C  albuterol (PROVENTIL HFA;VENTOLIN HFA) 108 (90 BASE) MCG/ACT inhaler Inhale 2 puffs into the lungs every 6 (six) hours as needed for wheezing or shortness of breath.   Yes [provider]  budesonide-formoterol (SYMBICORT) 160-4.5 MCG/ACT inhaler Inhale 1 puff into the lungs 2 (two) times daily. 04/23/16  Yes Juanito Doom, MD  Calcium Carbonate-Vitamin D (CALCIUM + D) 600-200 MG-UNIT TABS Take 1 tablet by mouth 2 (two) times daily.   Yes [provider]  ELIQUIS 5 MG TABS tablet TAKE 1 TABLET BY MOUTH TWO  TIMES DAILY 11/04/16  Yes Belva Crome, MD  flecainide (TAMBOCOR) 50 MG tablet TAKE 1 TABLET BY  MOUTH TWO  TIMES DAILY 01/16/17  Yes Belva Crome, MD  hydrochlorothiazide (MICROZIDE) 12.5 MG capsule Take 12.5 mg by mouth daily.   Yes [provider]  hydroxypropyl methylcellulose (ISOPTO TEARS) 2.5 % ophthalmic solution Place 1 drop into both eyes 2 (two) times daily as needed for dry eyes. For dry eyes   Yes [provider]  levofloxacin (LEVAQUIN) 500 MG tablet Take 500 mg by mouth daily. 02/25/17  Yes [provider]  LORazepam (ATIVAN) 0.5 MG tablet Take 0.5 mg by mouth 2 (two) times daily as needed for anxiety. 09/30/16  Yes [provider]  montelukast (SINGULAIR) 10 MG tablet Take 10 mg by mouth daily  before breakfast.    Yes [provider]  Multiple Vitamins tablet Take 1 tablet by mouth daily.   Yes [provider]  Omega-3 Fatty Acids (FISH OIL) 1000 MG CAPS Take 1,000 mg by mouth 2 (two) times daily.   Yes [provider]  omeprazole (PRILOSEC) 40 MG capsule Take 40 mg by mouth at bedtime.    Yes [provider]  predniSONE (DELTASONE) 10 MG tablet 4 tabs daily X2 days, 2 tabs daily X2 days, 1 tab daily X2 days, 0.5 tab daily X2 days. 02/23/17  Yes Brand Males, MD  telmisartan (MICARDIS) 80 MG tablet Take 1 tablet (80 mg total) by mouth daily. 02/09/17  Yes Belva Crome, MD  nitroGLYCERIN (NITROSTAT) 0.4 MG SL tablet Place 1 tablet (0.4 mg total) under the tongue every 5 (five) minutes x 3 doses as needed for chest pain. 03/18/15   Erlene Quan, PA-C    Physical Exam: Vitals:   02/27/17 1100 02/27/17 1151 02/27/17 1200 02/27/17 1315  BP: 130/66 124/70 (!) 146/67 (!) 175/91  Pulse: 72 67 68 84  Resp: 15 15 10 17   Temp:      TempSrc:      SpO2: 92% 93% 99% 93%   General: Not in acute distress HEENT:       Eyes: PERRL, EOMI, no scleral icterus.       ENT: No discharge from the ears and nose, no pharynx injection, no tonsillar enlargement.        Neck: No JVD, no bruit, no mass felt. Heme: No  neck lymph node enlargement. Cardiac: S1/S2, RRR, No murmurs, No gallops or rubs. Respiratory: Has defused wheezing or rhonchi bilaterally. GI: Soft, nondistended, nontender, no rebound pain, no organomegaly, BS present. GU: No hematuria Ext: 1+ leg edema bilaterally. 2+DP/PT pulse bilaterally. Musculoskeletal: No joint deformities, No joint redness or warmth, no limitation of ROM in spin. Skin: No rashes.  Neuro: Alert, oriented X3, cranial nerves II-XII grossly intact, moves all extremities normally.  Psych: Patient is not psychotic, no suicidal or hemocidal ideation.  Labs on Admission: I have personally reviewed following labs and imaging studies  CBC: Recent Labs  Lab 02/27/17 1030  WBC 7.3  HGB 14.3  HCT 41.1  MCV 89.7  PLT 409   Basic Metabolic Panel: Recent Labs  Lab 02/27/17 1030  NA 139  K 3.6  CL 102  CO2 28  GLUCOSE 127*  BUN 15  CREATININE 1.20  CALCIUM 9.2   GFR: CrCl cannot be calculated (Unknown ideal weight.). Liver Function Tests: No results for input(s): AST, ALT, ALKPHOS, BILITOT, PROT, ALBUMIN in the last 168 hours. No results for input(s): LIPASE, AMYLASE in the last 168 hours. No results for input(s): AMMONIA in the last 168 hours. Coagulation Profile: No results for input(s): INR, PROTIME in the last 168 hours. Cardiac Enzymes: No results for input(s): CKTOTAL, CKMB, CKMBINDEX, TROPONINI in the last 168 hours. BNP (last 3 results) No results for input(s): PROBNP in the last 8760 hours. HbA1C: No results for input(s): HGBA1C in the last 72 hours. CBG: No results for input(s): GLUCAP in the last 168 hours. Lipid Profile: No results for input(s): CHOL, HDL, LDLCALC, TRIG, CHOLHDL, LDLDIRECT in the last 72 hours. Thyroid Function Tests: No results for input(s): TSH, T4TOTAL, FREET4, T3FREE, THYROIDAB in the last 72 hours. Anemia Panel: No results for input(s): VITAMINB12, FOLATE, FERRITIN, TIBC, IRON, RETICCTPCT in the last 72 hours. Urine  analysis:    Component Value Date/Time  COLORURINE AMBER (A) 04/11/2011 1007   APPEARANCEUR CLEAR 04/11/2011 1007   LABSPEC 1.024 04/11/2011 1007   PHURINE 5.5 04/11/2011 1007   GLUCOSEU NEGATIVE 04/11/2011 1007   HGBUR NEGATIVE 04/11/2011 1007   BILIRUBINUR NEGATIVE 04/11/2011 1007   KETONESUR NEGATIVE 04/11/2011 1007   PROTEINUR NEGATIVE 04/11/2011 1007   UROBILINOGEN 1.0 04/11/2011 1007   NITRITE NEGATIVE 04/11/2011 1007   LEUKOCYTESUR NEGATIVE 04/11/2011 1007   Sepsis Labs: @LABRCNTIP (procalcitonin:4,lacticidven:4) )No results found for this or any previous visit (from the past 240 hour(s)).   Radiological Exams on Admission: Dg Chest 2 View  Result Date: 02/27/2017 CLINICAL DATA:  Chest pain, shortness of breath, cough EXAM: CHEST  2 VIEW COMPARISON:  09/29/2015 FINDINGS: Cardiomegaly. No edema or effusions. Patchy left lower lobe airspace opacity best seen on the lateral view. This could reflect atelectasis or early infiltrate. Right lung is clear. No acute bony abnormality. IMPRESSION: Cardiomegaly. Left lower lobe atelectasis or early infiltrate. Electronically Signed   By: Rolm Baptise M.D.   On: 02/27/2017 11:38     EKG: Independently reviewed.  Sinus rhythm, QTC 412, LAD, low voltage.  Assessment/Plan Principal Problem:   Acute and chronic respiratory failure with hypoxia (HCC) Active Problems:   Hypertension   Asthma   PAF (paroxysmal atrial fibrillation) (HCC)   Chronic anticoagulation   Anxiety   Lobar pneumonia (HCC)   COPD with acute exacerbation (HCC)   GERD (gastroesophageal reflux disease)   Chest pain   CKD (chronic kidney disease), stage III (HCC)   Acute and chronic respiratory failure with hypoxia due to lobar pneumonia and COPD exacerbation: Patient failed outpatient antibiotic treatment.  -will admit patient to telemetry bed  -Nebulizers: scheduled Duoneb and prn albuterol -Solu-Medrol 60 mg IV tid -Rocephin plus doxycycline -Mucinex for  cough  -Incentive spirometry -Urine drug screen, HIV -Urine Legionella and S. pneumococcal antigen -Follow up blood culture x2, sputum culture, respiratory virus panel, Flu pcr -Nasal cannula oxygen as needed to maintain O2 saturation 92% or greater  Chest pain: Likely due to pneumonia, but the patient states that his chest pain is not pleuritic.  It is intermittent, radiating to the left shoulder, and needed to rule out ACS. - cycle CE q6 x3 and repeat EKG in the am  - prn Nitroglycerin, Morphine - Risk factor stratification: will check FLP and A1C  - 2d echo  Hypertension: -hold hctz since pt is at risk of developing sepsis -Continue telmisartan -IV hydralazine as needed  PAF (paroxysmal atrial fibrillation) ([place]): CHA2DS2-VASc Score is 3, needs oral anticoagulation. Patient is on Eliquis at home. INR is  on admission. Heart rate is well controlled. -tele monitoring -continue home flecainde  GERD: -Protonix  Anxiety: -cotninue prn ativan  CKD (chronic kidney disease), stage III ([place]): stable.  Baseline creatinine 1.1-1.3.  His creatinine is 1.2, BUN 15. -Follow-up renal function by BMP   DVT ppx: on Eliquis Code Status: Full code Family Communication: None at bed side.   Disposition Plan:  Anticipate discharge back to previous home environment Consults called:  none Admission status:  Inpatient/tele     Date of Service 02/27/2017    Ivor Costa [facility] Pager [phone]  If 7PM-7AM, please contact night-coverage [email or web address] Password [email or web address] 02/27/2017, 1:47 PM

## 2017-02-27 NOTE — ED Notes (Signed)
Patient transported to X-ray 

## 2017-02-27 NOTE — ED Provider Notes (Signed)
[facility] DEPT Provider Note   CSN: [id number] Arrival date & time: 02/27/17  1016     History   Chief Complaint Chief Complaint  Patient presents with  . Chest Pain    HPI DANDREA WIDDOWSON is a 76 y.o. male.  HPI   Patient 76 year old male.  He reports that he is been ill since 3 December.  He reports that he went to see his primary care physician was given symptomatic care and discharge home.  He then went to urgent care as he worsened.  He was given prednisone taper as well as some type of antibiotics.  He reports he continued to do poorly so he went to his primary care again on Wednesday and was given Levaquin additional to addition of prednisone.  He still not improved.  Patient here with b shortness of pain breath, chest pain, extreme cough.  Patient has history of asthma and COPD.  Followed by Dr. Lake Bells with pulmonology.  Past Medical History:  Diagnosis Date  . Angina    chest pain- cardiac cath. followed in 2010, record available ,  told then that he should f/u /w Dr. Marisue Humble    . Arthritis    R knee, back    . Asthma    uses  singulair  . Cancer [facility])    prostate, melanoma- 2010, excision    . Chronic kidney disease    prostate cancer - surg. removal- 1996  . Colon polyps   . Diverticulitis   . Dysrhythmia    palpitations, followed by Dr.Ehinger, seen in prep for surgery on 03/27/2011   . GERD (gastroesophageal reflux disease)   . Hepatitis    jaundice- many yrs. ago  . Hiatal hernia   . Hypertension   . 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 OSA (obstructive sleep apnea) 04/11/2015   Mild to moderate OSA with AHI 13.3/hr overall and AHI 36.8/hr during REM sleep.  Oxygen saturations were as low as 86% during respiratory events.  . Pneumonia    hosp. 20 yrs. ago  . Prostate cancer ([place]) 1995  . Sleep apnea    study done, 10 yrs. ago, told that he needed  CPAP but  never used     Patient Active Problem List   Diagnosis Date Noted  .  Anxiety 10/30/2015  . Dyspnea 08/20/2015  . Solitary pulmonary nodule 05/18/2015  . OSA (obstructive sleep apnea) 04/11/2015  . Normal coronary arteries 2013 03/17/2015  . Chronic anticoagulation 03/17/2015  . Atrial fibrillation with RVR ([place]) 03/17/2015  . Asthma, chronic obstructive, with acute exacerbation ([place]) 02/26/2015  . PAF (paroxysmal atrial fibrillation) ([place]) 01/26/2015  . Melanoma of skin ([place])   . Chest pain at rest 09/06/2011  . Postoperative anemia due to acute blood loss 04/24/2011  . Osteoarthritis of knee 04/23/2011  . Hypertension 04/23/2011  . History of prostate cancer 04/23/2011  . Melanoma ([place]) 04/23/2011  . Hiatal hernia 04/23/2011  . Asthma 04/23/2011  . Obesity, Class II, BMI 35-39.9, with comorbidity ([place]) 04/23/2011    Past Surgical History:  Procedure Laterality Date  . CARDIAC CATHETERIZATION     2010  . CARDIAC CATHETERIZATION N/A 10/22/2015   Procedure: Left Heart Cath and Coronary Angiography;  Surgeon: Nelva Bush, MD;  Location: [facility] CV LAB;  Service: Cardiovascular;  Laterality: N/A;  . FRACTURE SURGERY     L wrist, hardware- 1982  . JOINT REPLACEMENT     L knee, 2009  . LEFT  HEART CATHETERIZATION WITH CORONARY ANGIOGRAM N/A 09/08/2011   Procedure: LEFT HEART CATHETERIZATION WITH CORONARY ANGIOGRAM;  Surgeon: Sinclair Grooms, MD;  Location: [facility] CATH LAB;  Service: Cardiovascular;  Laterality: N/A;  . PROSTATECTOMY     for ca  . TONSILLECTOMY     as child  . TOTAL KNEE ARTHROPLASTY  04/22/2011   Procedure: TOTAL KNEE ARTHROPLASTY;  Surgeon: Garald Balding, MD;  Location: [facility];  Service: Orthopedics;  Laterality: Right;       Home Medications    Prior to Admission medications   Medication Sig Start Date End Date Taking? Authorizing Provider  acetaminophen (TYLENOL) 325 MG tablet Take 2 tablets (650 mg total) by mouth every 4 (four) hours as needed for headache or mild pain. 03/18/15  Yes Kilroy, Luke K, PA-C  albuterol  (PROVENTIL HFA;VENTOLIN HFA) 108 (90 BASE) MCG/ACT inhaler Inhale 2 puffs into the lungs every 6 (six) hours as needed for wheezing or shortness of breath.   Yes [provider]  budesonide-formoterol (SYMBICORT) 160-4.5 MCG/ACT inhaler Inhale 1 puff into the lungs 2 (two) times daily. 04/23/16  Yes Juanito Doom, MD  Calcium Carbonate-Vitamin D (CALCIUM + D) 600-200 MG-UNIT TABS Take 1 tablet by mouth 2 (two) times daily.   Yes [provider]  ELIQUIS 5 MG TABS tablet TAKE 1 TABLET BY MOUTH TWO  TIMES DAILY 11/04/16  Yes Belva Crome, MD  flecainide (TAMBOCOR) 50 MG tablet TAKE 1 TABLET BY MOUTH TWO  TIMES DAILY 01/16/17  Yes Belva Crome, MD  hydrochlorothiazide (MICROZIDE) 12.5 MG capsule Take 12.5 mg by mouth daily.   Yes [provider]  hydroxypropyl methylcellulose (ISOPTO TEARS) 2.5 % ophthalmic solution Place 1 drop into both eyes 2 (two) times daily as needed for dry eyes. For dry eyes   Yes [provider]  levofloxacin (LEVAQUIN) 500 MG tablet Take 500 mg by mouth daily. 02/25/17  Yes [provider]  LORazepam (ATIVAN) 0.5 MG tablet Take 0.5 mg by mouth 2 (two) times daily as needed for anxiety. 09/30/16  Yes [provider]  montelukast (SINGULAIR) 10 MG tablet Take 10 mg by mouth daily before breakfast.    Yes [provider]  Multiple Vitamins tablet Take 1 tablet by mouth daily.   Yes [provider]  Omega-3 Fatty Acids (FISH OIL) 1000 MG CAPS Take 1,000 mg by mouth 2 (two) times daily.   Yes [provider]  omeprazole (PRILOSEC) 40 MG capsule Take 40 mg by mouth at bedtime.    Yes [provider]  predniSONE (DELTASONE) 10 MG tablet 4 tabs daily X2 days, 2 tabs daily X2 days, 1 tab daily X2 days, 0.5 tab daily X2 days. 02/23/17  Yes Brand Males, MD  telmisartan (MICARDIS) 80 MG tablet Take 1 tablet (80 mg total) by mouth daily. 02/09/17  Yes Belva Crome, MD  nitroGLYCERIN  (NITROSTAT) 0.4 MG SL tablet Place 1 tablet (0.4 mg total) under the tongue every 5 (five) minutes x 3 doses as needed for chest pain. 03/18/15   Erlene Quan, PA-C    Family History Family History  Problem Relation Age of Onset  . Crohn's disease Brother   . Breast cancer Mother   . Alzheimer's disease Father   . Heart disease Brother   . Hypertension Brother   . Anesthesia problems Neg Hx   . Hypotension Neg Hx   . Malignant hyperthermia Neg Hx   . Pseudochol deficiency Neg Hx  Social History Social History   Tobacco Use  . Smoking status: Former Smoker    Packs/day: 1.50    Years: 30.00    Pack years: 45.00    Types: Cigarettes    Last attempt to quit: 04/11/1983    Years since quitting: 33.9  . Smokeless tobacco: Never Used  Substance Use Topics  . Alcohol use: No    Alcohol/week: 0.0 oz  . Drug use: No     Allergies   Aminophylline; Carvedilol; Lisinopril; Tenormin [atenolol]; and Zolpidem tartrate   Review of Systems Review of Systems  Constitutional: Positive for fatigue and fever. Negative for activity change.  Respiratory: Positive for cough, chest tightness, shortness of breath and wheezing.   Cardiovascular: Negative for chest pain.  Gastrointestinal: Positive for nausea and vomiting. Negative for abdominal pain.     Physical Exam Updated Vital Signs BP (!) 146/67   Pulse 68   Temp 97.6 F (36.4 C) (Oral)   Resp 10   SpO2 99%   Physical Exam  Constitutional: He is oriented to person, place, and time. He appears well-nourished.  HENT:  Head: Normocephalic.  Eyes: Conjunctivae are normal. Pupils are equal, round, and reactive to light.  Cardiovascular: Normal rate.  Pulmonary/Chest: Tachypnea noted. He has wheezes. He has rhonchi.  Increased work of breathing.  Diffusely wheezing with rhonchi bilaterally.  Abdominal: Soft. There is no tenderness.  Neurological: He is oriented to person, place, and time.  Skin: Skin is warm and dry. He is  not diaphoretic.  Psychiatric: He has a normal mood and affect. His behavior is normal.  Nursing note and vitals reviewed.    ED Treatments / Results  Labs (all labs ordered are listed, but only abnormal results are displayed) Labs Reviewed  BASIC METABOLIC PANEL - Abnormal; Notable for the following components:      Result Value   Glucose, Bld 127 (*)    GFR calc non Af Amer 56 (*)    All other components within normal limits  CBC  I-STAT TROPONIN, ED    EKG  EKG Interpretation  Date/Time:  Friday February 27 2017 10:24:33 EST Ventricular Rate:  82 PR Interval:    QRS Duration: 103 QT Interval:  352 QTC Calculation: 412 R Axis:   -15 Text Interpretation:  Sinus rhythm Borderline left axis deviation Normal sinus rhythm Confirmed by Thomasene Lot, [place] [id number]) on 02/27/2017 10:56:36 AM       Radiology Dg Chest 2 View  Result Date: 02/27/2017 CLINICAL DATA:  Chest pain, shortness of breath, cough EXAM: CHEST  2 VIEW COMPARISON:  09/29/2015 FINDINGS: Cardiomegaly. No edema or effusions. Patchy left lower lobe airspace opacity best seen on the lateral view. This could reflect atelectasis or early infiltrate. Right lung is clear. No acute bony abnormality. IMPRESSION: Cardiomegaly. Left lower lobe atelectasis or early infiltrate. Electronically Signed   By: Rolm Baptise M.D.   On: 02/27/2017 11:38    Procedures Procedures (including critical care time)  Medications Ordered in ED Medications  ipratropium-albuterol (DUONEB) 0.5-2.5 (3) MG/3ML nebulizer solution 3 mL (3 mLs Nebulization Given 02/27/17 1150)  ipratropium-albuterol (DUONEB) 0.5-2.5 (3) MG/3ML nebulizer solution 3 mL (3 mLs Nebulization Given 02/27/17 1150)  predniSONE (DELTASONE) tablet 60 mg (60 mg Oral Given 02/27/17 1151)     Initial Impression / Assessment and Plan / ED Course  I have reviewed the triage vital signs and the nursing notes.  Pertinent labs & imaging results that were available during my  care of the patient  were reviewed by me and considered in my medical decision making (see chart for details).     Patient 76 year old male.  He reports that he is been ill since 3 December.  He reports that he went to see his primary care physician was given symptomatic care and discharge home.  He then went to urgent care as he worsened.  He was given prednisone taper as well as some type of antibiotics.  He reports he continued to do poorly so he went to his primary care again on Wednesday and was given Levaquin additional to addition of prednisone.  He still not improved.  Patient here with b shortness of pain breath, chest pain, extreme cough.  Patient has history of asthma and COPD.  Followed by Dr. Lake Bells with pulmonology.  12:48 PM Patient's been on 3 days of Levaquin as well as another antibiotic before this.  He has been on multiple doses of prednisone.  He still has significant lung findings.  And as well as a pneumonia seen on x-ray.  I think it is appropriate given his age, comorbidities to admit patient make sure that he improved on IV antibiotics.  Will be careful to antibiotic choice because he is on flecainide.  Talked to pharmacy.  Will do doxy and ceftriaxone.  Will admit to hospital given that he is still having significant lung findings despite treatment.  Final Clinical Impressions(s) / ED Diagnoses   Final diagnoses:  None    ED Discharge Orders    None       Macarthur Critchley, MD 02/27/17 [id number]

## 2017-02-27 NOTE — ED Notes (Signed)
Dr. Blaine Hamper at bedside speaking with patient.

## 2017-02-28 ENCOUNTER — Inpatient Hospital Stay (HOSPITAL_COMMUNITY): Payer: Medicare Other

## 2017-02-28 DIAGNOSIS — R079 Chest pain, unspecified: Secondary | ICD-10-CM

## 2017-02-28 DIAGNOSIS — J9621 Acute and chronic respiratory failure with hypoxia: Secondary | ICD-10-CM

## 2017-02-28 LAB — LIPID PANEL
CHOL/HDL RATIO: 3.5 ratio
CHOLESTEROL: 182 mg/dL (ref 0–200)
HDL: 52 mg/dL (ref >40)
LDL Cholesterol: 120 mg/dL — ABNORMAL HIGH (ref 0–99)
TRIGLYCERIDES: 52 mg/dL (ref <150)
VLDL: 10 mg/dL (ref 0–40)

## 2017-02-28 LAB — HEMOGLOBIN A1C
Hgb A1c MFr Bld: 5.5 % (ref 4.8–5.6)
MEAN PLASMA GLUCOSE: 111.15 mg/dL

## 2017-02-28 LAB — ECHOCARDIOGRAM COMPLETE
Height: 67 in
WEIGHTICAEL: 3971.81 [oz_av]

## 2017-02-28 LAB — STREP PNEUMONIAE URINARY ANTIGEN: Strep Pneumo Urinary Antigen: NEGATIVE

## 2017-02-28 LAB — TROPONIN I: Troponin I: <0.03 ng/mL (ref <0.03)

## 2017-02-28 MED ORDER — BENZONATATE 100 MG PO CAPS
200.0000 mg | ORAL_CAPSULE | Freq: Three times a day (TID) | ORAL | Status: DC | PRN
  Administered 2017-02-28 – 2017-03-03 (×5): 200 mg via ORAL
  Filled 2017-02-28 (×5): qty 2

## 2017-02-28 MED ORDER — CALCIUM CARBONATE-VITAMIN D 500-200 MG-UNIT PO TABS
1.0000 | ORAL_TABLET | Freq: Two times a day (BID) | ORAL | Status: DC
  Administered 2017-02-28 – 2017-03-04 (×9): 1 via ORAL
  Filled 2017-02-28 (×9): qty 1

## 2017-02-28 MED ORDER — HYDROCHLOROTHIAZIDE 12.5 MG PO CAPS
12.5000 mg | ORAL_CAPSULE | Freq: Every day | ORAL | Status: DC
  Administered 2017-03-01 – 2017-03-04 (×4): 12.5 mg via ORAL
  Filled 2017-02-28 (×4): qty 1

## 2017-02-28 NOTE — Plan of Care (Signed)
  Activity: Risk for activity intolerance will decrease 02/28/2017 2209 - Progressing by Ashley Murrain, RN

## 2017-02-28 NOTE — Progress Notes (Signed)
  Echocardiogram 2D Echocardiogram has been performed.  Joelene Millin 02/28/2017, 11:13 AM

## 2017-02-28 NOTE — Plan of Care (Signed)
  Education: Knowledge of General Education information will improve 02/28/2017 0414 - Progressing by Ashley Murrain, RN   Clinical Measurements: Respiratory complications will improve 02/28/2017 0414 - Progressing by Ashley Murrain, RN

## 2017-02-28 NOTE — Progress Notes (Signed)
[facility] PROGRESS NOTE  VIAN FLUEGEL CHE:[id number] DOB: 31-Dec-1940 DOA: 02/27/2017 PCP: Gaynelle Arabian, MD  Brief summary    76 y.o. male with medical history significant of COPD, asthma, CKD-III, hypertension, gerd, anxiety, prostate cancer, OSA not on CPAP, diverticulitis, A fib on Eliquis, obesity, who presents with cough, shortness breath, wheezing or chest pain. Patient states that he has been having cough, shortness of breath for 3 weeks. He was seen by PCP, and finished 1 course of antibiotics without help, and started Levaquin in the past to 3 days, still without any improvement.  He coughs up yellow colored sputum.  He also has wheezing, runny nose, no sore throat.  No fever or chills.  States that he does not have diarrhea or abdominal pain.  He had nausea and vomited once yesterday when he had severe cough.  States that he has been having intermittent chest pain, which is locaetd at substernal area, radiating to the left shoulder.  It is not pleuritic. No tenderness in the calf area. No symptoms of UTI or unilateral weakness.  ED Course: pt was found to have WBC 7.3, negative troponin, stable renal function, temperature normal, no tachycardia, no tachypnea, oxygen saturation 92-99% on 2 L nasal cannula oxygen.  Chest x-ray showed left lower lobe infiltration.  Patient is admitted to telemetry bed as inpatient    Assessment/Plan:  Principal Problem:   Acute and chronic respiratory failure with hypoxia (HCC) Active Problems:   Hypertension   Asthma   PAF (paroxysmal atrial fibrillation) (HCC)   Chronic anticoagulation   Anxiety   Lobar pneumonia (HCC)   COPD with acute exacerbation (HCC)   GERD (gastroesophageal reflux disease)   Chest pain   CKD (chronic kidney disease), stage III (HCC)   Acute and chronic respiratory failure with hypoxia due to +RSV and possible lobar pneumonia and COPD exacerbation. Patient failed outpatient antibiotic treatment.  -Cont  Nebulizers: scheduled Duoneb and prn albuterol. Solu-Medrol 60 mg IV tid and deescalate AM. Cont Rocephin plus doxycycline for now. Mucinex for cough. Nasal cannula oxygen as neededto maintain O2 saturation 92% or greater  Chest pain. No recurrence.  Likely due to pneumonia. ruled out ACS, trop is negative. Awaiting 2d echo  Hypertension. Resume hctz, telmisartan. IV hydralazine as needed  PAF (paroxysmal atrial fibrillation) ([place]): CHA2DS2-VASc Score is 3, needs oral anticoagulation. Patient is on Eliquis at home. INR is  on admission. Heart rate is well controlled. continue home flecainde  GERD: cont Protonix  Anxiety: cotninue prn ativan  CKD (chronic kidney disease), stage III ([place]): stable.  Baseline creatinine 1.1-1.3.  His creatinine is 1.2, BUN 15. Follow-up renal function by BMP    Code Status: full Family Communication: d/w patient, RN (indicate person spoken with, relationship, and if by phone, the number) Disposition Plan: home when improved    Consultants:  none  Procedures:  none  Antibiotics: Anti-infectives (From admission, onward)   Start     Dose/Rate Route Frequency Ordered Stop   02/28/17 1300  cefTRIAXone (ROCEPHIN) 1 g in dextrose 5 % 50 mL IVPB     1 g 100 mL/hr over 30 Minutes Intravenous Every 24 hours 02/27/17 1318 03/07/17 1259   02/27/17 2200  doxycycline (VIBRA-TABS) tablet 100 mg     100 mg Oral Every 12 hours 02/27/17 1318 03/06/17 2159   02/27/17 1315  doxycycline (VIBRAMYCIN) 100 mg in dextrose 5 % 250 mL IVPB     100 mg 125 mL/hr over 120 Minutes Intravenous  Once  02/27/17 1300 02/27/17 1540   02/27/17 1315  cefTRIAXone (ROCEPHIN) 1 g in dextrose 5 % 50 mL IVPB     1 g 100 mL/hr over 30 Minutes Intravenous  Once 02/27/17 1300 02/27/17 1344   02/27/17 1300  doxycycline (VIBRAMYCIN) 100 mg in dextrose 5 % 250 mL IVPB  Status:  Discontinued     100 mg 125 mL/hr over 120 Minutes Intravenous  Once 02/27/17 1259 02/27/17 1259   02/27/17  1300  amoxicillin-clavulanate (AUGMENTIN) 875-125 MG per tablet 1 tablet  Status:  Discontinued     1 tablet Oral  Once 02/27/17 1259 02/27/17 1259        (indicate start date, and stop date if known)  HPI/Subjective: Alert. Reports wheezing. Afebrile. Still with productive cough.   Objective: Vitals:   02/28/17 0358 02/28/17 0524  BP:  (!) 125/52  Pulse:  85  Resp:  18  Temp:  98.1 F (36.7 C)  SpO2: 98% 95%    Intake/Output Summary (Last 24 hours) at 02/28/2017 0840 Last data filed at 02/28/2017 0800 Gross per 24 hour  Intake 1766.25 ml  Output 675 ml  Net 1091.25 ml   Filed Weights   02/27/17 1640  Weight: 112.6 kg (248 lb 3.8 oz)    Exam:   General:  No distress.   Cardiovascular: s1,s2 rrr  Respiratory: BL few wheezing   Abdomen: soft, nt, nd   Musculoskeletal: no leg edema    Data Reviewed: Basic Metabolic Panel: Recent Labs  Lab 02/27/17 1030  NA 139  K 3.6  CL 102  CO2 28  GLUCOSE 127*  BUN 15  CREATININE 1.20  CALCIUM 9.2   Liver Function Tests: No results for input(s): AST, ALT, ALKPHOS, BILITOT, PROT, ALBUMIN in the last 168 hours. No results for input(s): LIPASE, AMYLASE in the last 168 hours. No results for input(s): AMMONIA in the last 168 hours. CBC: Recent Labs  Lab 02/27/17 1030  WBC 7.3  HGB 14.3  HCT 41.1  MCV 89.7  PLT 165   Cardiac Enzymes: Recent Labs  Lab 02/27/17 1906 02/28/17 0218  TROPONINI <0.03 <0.03   BNP (last 3 results) No results for input(s): BNP in the last 8760 hours.  ProBNP (last 3 results) No results for input(s): PROBNP in the last 8760 hours.  CBG: No results for input(s): GLUCAP in the last 168 hours.  Recent Results (from the past 240 hour(s))  Respiratory Panel by PCR     Status: Abnormal   Collection Time: 02/27/17  1:46 PM  Result Value Ref Range Status   Adenovirus NOT DETECTED NOT DETECTED Final   Coronavirus 229E NOT DETECTED NOT DETECTED Final   Coronavirus HKU1 NOT  DETECTED NOT DETECTED Final   Coronavirus NL63 NOT DETECTED NOT DETECTED Final   Coronavirus OC43 NOT DETECTED NOT DETECTED Final   Metapneumovirus NOT DETECTED NOT DETECTED Final   Rhinovirus / Enterovirus NOT DETECTED NOT DETECTED Final   Influenza A NOT DETECTED NOT DETECTED Final   Influenza B NOT DETECTED NOT DETECTED Final   Parainfluenza Virus 1 NOT DETECTED NOT DETECTED Final   Parainfluenza Virus 2 NOT DETECTED NOT DETECTED Final   Parainfluenza Virus 3 NOT DETECTED NOT DETECTED Final   Parainfluenza Virus 4 NOT DETECTED NOT DETECTED Final   Respiratory Syncytial Virus DETECTED (A) NOT DETECTED Final    Comment: CRITICAL RESULT CALLED TO, READ BACK BY AND VERIFIED WITH: ADDISONC,RN @1945  02/27/17 BY LHOWARD    Bordetella pertussis NOT DETECTED NOT DETECTED Final  Chlamydophila pneumoniae NOT DETECTED NOT DETECTED Final   Mycoplasma pneumoniae NOT DETECTED NOT DETECTED Final    Comment: Performed at [facility], [place] [address] [place], [place] [address]     Studies: Dg Chest 2 View  Result Date: 02/27/2017 CLINICAL DATA:  Chest pain, shortness of breath, cough EXAM: CHEST  2 VIEW COMPARISON:  09/29/2015 FINDINGS: Cardiomegaly. No edema or effusions. Patchy left lower lobe airspace opacity best seen on the lateral view. This could reflect atelectasis or early infiltrate. Right lung is clear. No acute bony abnormality. IMPRESSION: Cardiomegaly. Left lower lobe atelectasis or early infiltrate. Electronically Signed   By: Rolm Baptise M.D.   On: 02/27/2017 11:38    Scheduled Meds: . apixaban  5 mg Oral BID  . calcium-vitamin D  1 tablet Oral BID  . doxycycline  100 mg Oral Q12H  . flecainide  50 mg Oral BID  . ipratropium-albuterol  3 mL Nebulization Q4H  . irbesartan  300 mg Oral Daily  . methylPREDNISolone (SOLU-MEDROL) injection  60 mg Intravenous TID  . montelukast  10 mg Oral QAC breakfast  . multivitamin with minerals  1 tablet Oral Q lunch  . omega-3 acid  ethyl esters  1 g Oral Daily  . pantoprazole  40 mg Oral Daily   Continuous Infusions: . sodium chloride 75 mL/hr at 02/28/17 0615  . cefTRIAXone (ROCEPHIN)  IV      Principal Problem:   Acute and chronic respiratory failure with hypoxia (HCC) Active Problems:   Hypertension   Asthma   PAF (paroxysmal atrial fibrillation) (HCC)   Chronic anticoagulation   Anxiety   Lobar pneumonia (HCC)   COPD with acute exacerbation (HCC)   GERD (gastroesophageal reflux disease)   Chest pain   CKD (chronic kidney disease), stage III ([place])    Time spent: >35 minutes     Kinnie Feil  [facility] Pager [phone]. If 7PM-7AM, please contact night-coverage at [email or web address], password [email or web address] 02/28/2017, 8:40 AM  LOS: 1 day

## 2017-03-01 DIAGNOSIS — N183 Chronic kidney disease, stage 3 (moderate): Secondary | ICD-10-CM

## 2017-03-01 LAB — LEGIONELLA PNEUMOPHILA SEROGP 1 UR AG: L. pneumophila Serogp 1 Ur Ag: NEGATIVE

## 2017-03-01 MED ORDER — SENNOSIDES-DOCUSATE SODIUM 8.6-50 MG PO TABS
1.0000 | ORAL_TABLET | Freq: Every day | ORAL | Status: DC
  Administered 2017-03-01 – 2017-03-03 (×3): 1 via ORAL
  Filled 2017-03-01 (×3): qty 1

## 2017-03-01 MED ORDER — POLYETHYLENE GLYCOL 3350 17 G PO PACK
17.0000 g | PACK | Freq: Every day | ORAL | Status: DC | PRN
  Administered 2017-03-01: 17 g via ORAL
  Filled 2017-03-01: qty 1

## 2017-03-01 NOTE — Progress Notes (Signed)
PROGRESS NOTE    [name] [name]  VQM:[id number] DOB: 1941-03-08 DOA: 02/27/2017 PCP: Gaynelle Arabian, MD   Brief Narrative: 76 y.o.malewith medical history significant ofCOPD, asthma,CKD-III,hypertension,gerd,anxiety, prostate cancer, OSA not on CPAP, diverticulitis, A fib on Eliquis,obesity, who presents with cough, shortness breath, wheezing or chest pain.  Assessment & Plan:   # Acute  respiratory failure with hypoxia due to +RSV and possible lobar pneumonia associated with acute COPD exacerbation: Patient failed outpatient oral antibiotic treatment.  Still having wheezing shortness of breath and cough, not at baseline.  Plan to continue ceftriaxone, doxycycline, nebulization and Solu-Medrol.  Continue supportive care.  Currently on room air.  #Chest pain likely due to pneumonia.  Troponin negative.  Echo with EF of 60-65% with normal wall motion.  #Hypertension: Continue current medication.  Monitor blood pressure.  #Paroxysmal atrial fibrillation: Continue Eliquis.  On flecainide.  Monitor heart rate.  #GERD: Continue Protonix  #Anxiety: Continue Ativan as needed and supportive care.  #Chronic kidney disease stage III: Serum creatinine level at baseline.  Monitor BMP.  Avoid nephrotoxins.  DVT prophylaxis: eliquis Code Status:full code Family Communication: No family at bedside Disposition Plan: Likely discharge home in 1-2 days    Consultants:   None  Procedures:None Antimicrobials: Doxycycline and ceftriaxone.  Subjective: Seen and examined at bedside.  Denied headache, dizziness.  Reported shortness of breath, cough and wheezing.  Denies chest pain  Objective: Vitals:   02/28/17 2330 03/01/17 0335 03/01/17 0513 03/01/17 0757  BP:   128/62   Pulse:   92   Resp:   18   Temp:   97.7 F (36.5 C)   TempSrc:   Oral   SpO2: 98% 94% 96% 96%  Weight:      Height:        Intake/Output Summary (Last 24 hours) at 03/01/2017 1452 Last data filed at  03/01/2017 1400 Gross per 24 hour  Intake 690 ml  Output 1400 ml  Net -710 ml   Filed Weights   02/27/17 1640  Weight: 112.6 kg (248 lb 3.8 oz)    Examination:  General exam: Appears calm and comfortable  Respiratory system: Bilateral diffuse expiratory wheeze, no crackle. Cardiovascular system: S1 & S2 heard, RRR.  No pedal edema. Gastrointestinal system: Abdomen is nondistended, soft and nontender. Normal bowel sounds heard. Central nervous system: Alert and oriented. No focal neurological deficits. Extremities: Symmetric 5 x 5 power. Skin: No rashes, lesions or ulcers Psychiatry: Judgement and insight appear normal. Mood & affect appropriate.     Data Reviewed: I have personally reviewed following labs and imaging studies  CBC: Recent Labs  Lab 02/27/17 1030  WBC 7.3  HGB 14.3  HCT 41.1  MCV 89.7  PLT 932   Basic Metabolic Panel: Recent Labs  Lab 02/27/17 1030  NA 139  K 3.6  CL 102  CO2 28  GLUCOSE 127*  BUN 15  CREATININE 1.20  CALCIUM 9.2   GFR: Estimated Creatinine Clearance: 60.8 mL/min (by C-G formula based on SCr of 1.2 mg/dL). Liver Function Tests: No results for input(s): AST, ALT, ALKPHOS, BILITOT, PROT, ALBUMIN in the last 168 hours. No results for input(s): LIPASE, AMYLASE in the last 168 hours. No results for input(s): AMMONIA in the last 168 hours. Coagulation Profile: No results for input(s): INR, PROTIME in the last 168 hours. Cardiac Enzymes: Recent Labs  Lab 02/27/17 1906 02/28/17 0218  TROPONINI <0.03 <0.03   BNP (last 3 results) No results for input(s): PROBNP in the last 8760  hours. HbA1C: Recent Labs    02/28/17 0218  HGBA1C 5.5   CBG: No results for input(s): GLUCAP in the last 168 hours. Lipid Profile: Recent Labs    02/28/17 0218  CHOL 182  HDL 52  LDLCALC 120*  TRIG 52  CHOLHDL 3.5   Thyroid Function Tests: No results for input(s): TSH, T4TOTAL, FREET4, T3FREE, THYROIDAB in the last 72 hours. Anemia  Panel: No results for input(s): VITAMINB12, FOLATE, FERRITIN, TIBC, IRON, RETICCTPCT in the last 72 hours. Sepsis Labs: No results for input(s): PROCALCITON, LATICACIDVEN in the last 168 hours.  Recent Results (from the past 240 hour(s))  Culture, blood (routine x 2) Call MD if unable to obtain prior to antibiotics being given     Status: None (Preliminary result)   Collection Time: 02/27/17  1:30 PM  Result Value Ref Range Status   Specimen Description BLOOD LEFT ANTECUBITAL  Final   Special Requests   Final    BOTTLES DRAWN AEROBIC AND ANAEROBIC Blood Culture adequate volume   Culture   Final    NO GROWTH 2 DAYS Performed at [facility], [address], [place], [place] [address]    Report Status PENDING  Incomplete  Respiratory Panel by PCR     Status: Abnormal   Collection Time: 02/27/17  1:46 PM  Result Value Ref Range Status   Adenovirus NOT DETECTED NOT DETECTED Final   Coronavirus 229E NOT DETECTED NOT DETECTED Final   Coronavirus HKU1 NOT DETECTED NOT DETECTED Final   Coronavirus NL63 NOT DETECTED NOT DETECTED Final   Coronavirus OC43 NOT DETECTED NOT DETECTED Final   Metapneumovirus NOT DETECTED NOT DETECTED Final   Rhinovirus / Enterovirus NOT DETECTED NOT DETECTED Final   Influenza A NOT DETECTED NOT DETECTED Final   Influenza B NOT DETECTED NOT DETECTED Final   Parainfluenza Virus 1 NOT DETECTED NOT DETECTED Final   Parainfluenza Virus 2 NOT DETECTED NOT DETECTED Final   Parainfluenza Virus 3 NOT DETECTED NOT DETECTED Final   Parainfluenza Virus 4 NOT DETECTED NOT DETECTED Final   Respiratory Syncytial Virus DETECTED (A) NOT DETECTED Final    Comment: CRITICAL RESULT CALLED TO, READ BACK BY AND VERIFIED WITH: ADDISONC,RN @1945  02/27/17 BY LHOWARD    Bordetella pertussis NOT DETECTED NOT DETECTED Final   Chlamydophila pneumoniae NOT DETECTED NOT DETECTED Final   Mycoplasma pneumoniae NOT DETECTED NOT DETECTED Final    Comment: Performed at [facility], [place] [address] [place], [place] [address]         Radiology Studies: No results found.      Scheduled Meds: . apixaban  5 mg Oral BID  . calcium-vitamin D  1 tablet Oral BID  . doxycycline  100 mg Oral Q12H  . flecainide  50 mg Oral BID  . hydrochlorothiazide  12.5 mg Oral Daily  . ipratropium-albuterol  3 mL Nebulization Q4H  . irbesartan  300 mg Oral Daily  . methylPREDNISolone (SOLU-MEDROL) injection  60 mg Intravenous TID  . montelukast  10 mg Oral QAC breakfast  . multivitamin with minerals  1 tablet Oral Q lunch  . omega-3 acid ethyl esters  1 g Oral Daily  . pantoprazole  40 mg Oral Daily  . senna-docusate  1 tablet Oral QHS   Continuous Infusions: . cefTRIAXone (ROCEPHIN)  IV Stopped (03/01/17 1441)     LOS: 2 days    Tyshauna Finkbiner Tanna Furry, MD [facility] Pager [phone]  If 7PM-7AM, please contact night-coverage [email or web address] Password [email or web address]  03/01/2017, 2:52 PM

## 2017-03-02 DIAGNOSIS — K5900 Constipation, unspecified: Secondary | ICD-10-CM

## 2017-03-02 MED ORDER — LACTULOSE 10 GM/15ML PO SOLN
20.0000 g | Freq: Two times a day (BID) | ORAL | Status: AC
Start: 2017-03-02 — End: 2017-03-03
  Administered 2017-03-02 – 2017-03-03 (×3): 20 g via ORAL
  Filled 2017-03-02 (×3): qty 30

## 2017-03-02 MED ORDER — MAGNESIUM CITRATE PO SOLN
1.0000 | Freq: Once | ORAL | Status: AC
  Administered 2017-03-02: 1 via ORAL
  Filled 2017-03-02: qty 296

## 2017-03-02 NOTE — Progress Notes (Signed)
PROGRESS NOTE    [name] [name]  DJM:[id number] DOB: October 07, 1940 DOA: 02/27/2017 PCP: Gaynelle Arabian, MD   Brief Narrative: 76 y.o.malewith medical history significant ofCOPD, asthma,CKD-III,hypertension,gerd,anxiety, prostate cancer, OSA not on CPAP, diverticulitis, A fib on Eliquis,obesity, who presents with cough, shortness breath, wheezing or chest pain.  Assessment & Plan:   # Acute  respiratory failure with hypoxia due to +RSV and possible lobar pneumonia associated with acute COPD exacerbation: Patient failed outpatient oral antibiotic treatment.  Still having minimal wheeze, coughing, not at baseline.  Plan to continue ceftriaxone, doxycycline, nebulization and Solu-Medrol.  Continue supportive care.  Currently on room air.  #Chest pain likely due to pneumonia.  Troponin negative.  Echo with EF of 60-65% with normal wall motion.  #Hypertension: Continue current medication.  Monitor blood pressure.  #Paroxysmal atrial fibrillation: Continue Eliquis.  On flecainide.  Monitor heart rate.  #GERD: Continue Protonix  #Anxiety: Continue Ativan as needed and supportive care.  #Chronic kidney disease stage III: Serum creatinine level at baseline.  Monitor BMP.  Avoid nephrotoxins.  #Constipation and abdominal discomfort: Patient reported no bowel movements for many days.  He has positive bowel sounds.  Already on a stool softener.  Adding lactulose and enema today.  Discussed with the patient and nursing staff.  Continue to monitor.  DVT prophylaxis: eliquis Code Status:full code Family Communication: No family at bedside Disposition Plan: Likely discharge home in 1-2 days    Consultants:   None  Procedures:None Antimicrobials: Doxycycline and ceftriaxone.  Subjective: Seen and examined at bedside.  Complains about constipation and no bowel movement for many days.  Still having mild shortness of breath and cough, not at baseline.  No chest  pain.  Objective: Vitals:   03/01/17 2314 03/02/17 0307 03/02/17 0452 03/02/17 0741  BP:   (!) 126/59   Pulse:   87   Resp:   17   Temp:   98 F (36.7 C)   TempSrc:   Axillary   SpO2: 93% 92% 92% 97%  Weight:      Height:        Intake/Output Summary (Last 24 hours) at 03/02/2017 1050 Last data filed at 03/01/2017 2247 Gross per 24 hour  Intake 180 ml  Output 200 ml  Net -20 ml   Filed Weights   02/27/17 1640  Weight: 112.6 kg (248 lb 3.8 oz)    Examination:  General exam: lying in bed, not in distress Respiratory system: Diffuse wheeze better than yesterday, no crackle, respiratory for normal. Cardiovascular system: S1 & S2 heard, RRR.  No pedal edema. Gastrointestinal system: Abdomen is mildly distended, bowel sounds positive, soft, nontender. Central nervous system: Alert awake and following commands. Skin: No rashes, lesions or ulcers Psychiatry: Judgement and insight appear normal. Mood & affect appropriate.     Data Reviewed: I have personally reviewed following labs and imaging studies  CBC: Recent Labs  Lab 02/27/17 1030  WBC 7.3  HGB 14.3  HCT 41.1  MCV 89.7  PLT 222   Basic Metabolic Panel: Recent Labs  Lab 02/27/17 1030  NA 139  K 3.6  CL 102  CO2 28  GLUCOSE 127*  BUN 15  CREATININE 1.20  CALCIUM 9.2   GFR: Estimated Creatinine Clearance: 60.8 mL/min (by C-G formula based on SCr of 1.2 mg/dL). Liver Function Tests: No results for input(s): AST, ALT, ALKPHOS, BILITOT, PROT, ALBUMIN in the last 168 hours. No results for input(s): LIPASE, AMYLASE in the last 168 hours. No results for input(s):  AMMONIA in the last 168 hours. Coagulation Profile: No results for input(s): INR, PROTIME in the last 168 hours. Cardiac Enzymes: Recent Labs  Lab 02/27/17 1906 02/28/17 0218  TROPONINI <0.03 <0.03   BNP (last 3 results) No results for input(s): PROBNP in the last 8760 hours. HbA1C: Recent Labs    02/28/17 0218  HGBA1C 5.5    CBG: No results for input(s): GLUCAP in the last 168 hours. Lipid Profile: Recent Labs    02/28/17 0218  CHOL 182  HDL 52  LDLCALC 120*  TRIG 52  CHOLHDL 3.5   Thyroid Function Tests: No results for input(s): TSH, T4TOTAL, FREET4, T3FREE, THYROIDAB in the last 72 hours. Anemia Panel: No results for input(s): VITAMINB12, FOLATE, FERRITIN, TIBC, IRON, RETICCTPCT in the last 72 hours. Sepsis Labs: No results for input(s): PROCALCITON, LATICACIDVEN in the last 168 hours.  Recent Results (from the past 240 hour(s))  Culture, blood (routine x 2) Call MD if unable to obtain prior to antibiotics being given     Status: None (Preliminary result)   Collection Time: 02/27/17  1:30 PM  Result Value Ref Range Status   Specimen Description BLOOD LEFT ANTECUBITAL  Final   Special Requests   Final    BOTTLES DRAWN AEROBIC AND ANAEROBIC Blood Culture adequate volume   Culture   Final    NO GROWTH 2 DAYS Performed at [facility], [address] [place], [place] [address]    Report Status PENDING  Incomplete  Respiratory Panel by PCR     Status: Abnormal   Collection Time: 02/27/17  1:46 PM  Result Value Ref Range Status   Adenovirus NOT DETECTED NOT DETECTED Final   Coronavirus 229E NOT DETECTED NOT DETECTED Final   Coronavirus HKU1 NOT DETECTED NOT DETECTED Final   Coronavirus NL63 NOT DETECTED NOT DETECTED Final   Coronavirus OC43 NOT DETECTED NOT DETECTED Final   Metapneumovirus NOT DETECTED NOT DETECTED Final   Rhinovirus / Enterovirus NOT DETECTED NOT DETECTED Final   Influenza A NOT DETECTED NOT DETECTED Final   Influenza B NOT DETECTED NOT DETECTED Final   Parainfluenza Virus 1 NOT DETECTED NOT DETECTED Final   Parainfluenza Virus 2 NOT DETECTED NOT DETECTED Final   Parainfluenza Virus 3 NOT DETECTED NOT DETECTED Final   Parainfluenza Virus 4 NOT DETECTED NOT DETECTED Final   Respiratory Syncytial Virus DETECTED (A) NOT DETECTED Final    Comment: CRITICAL RESULT  CALLED TO, READ BACK BY AND VERIFIED WITH: ADDISONC,RN @1945  02/27/17 BY LHOWARD    Bordetella pertussis NOT DETECTED NOT DETECTED Final   Chlamydophila pneumoniae NOT DETECTED NOT DETECTED Final   Mycoplasma pneumoniae NOT DETECTED NOT DETECTED Final    Comment: Performed at [facility], [place] [address] [place], [place] [address]         Radiology Studies: No results found.      Scheduled Meds: . apixaban  5 mg Oral BID  . calcium-vitamin D  1 tablet Oral BID  . doxycycline  100 mg Oral Q12H  . flecainide  50 mg Oral BID  . hydrochlorothiazide  12.5 mg Oral Daily  . ipratropium-albuterol  3 mL Nebulization Q4H  . irbesartan  300 mg Oral Daily  . lactulose  20 g Oral BID  . methylPREDNISolone (SOLU-MEDROL) injection  60 mg Intravenous TID  . montelukast  10 mg Oral QAC breakfast  . multivitamin with minerals  1 tablet Oral Q lunch  . omega-3 acid ethyl esters  1 g Oral  Daily  . pantoprazole  40 mg Oral Daily  . senna-docusate  1 tablet Oral QHS   Continuous Infusions: . cefTRIAXone (ROCEPHIN)  IV Stopped (03/01/17 1441)     LOS: 3 days    Mearle Drew Tanna Furry, MD [facility] Pager [phone]  If 7PM-7AM, please contact night-coverage [email or web address] Password [email or web address] 03/02/2017, 10:50 AM

## 2017-03-02 NOTE — Progress Notes (Addendum)
Patient had 2 BMs today.  1 small hard BM earlier today after lactulose and prune juice, and another softer medium sized BM this afternoon.  Patient did not want enema at this time, but stated he still felt constipated.  Patient states he wants 'to give this medicine some time to work' referring to [employer] citrate.  Patient has been ambulating and drinking fluids.

## 2017-03-03 ENCOUNTER — Inpatient Hospital Stay (HOSPITAL_COMMUNITY): Payer: Medicare Other

## 2017-03-03 LAB — BASIC METABOLIC PANEL
ANION GAP: 12 (ref 5–15)
BUN: 27 mg/dL — ABNORMAL HIGH (ref 6–20)
CO2: 28 mmol/L (ref 22–32)
Calcium: 9.5 mg/dL (ref 8.9–10.3)
Chloride: 100 mmol/L — ABNORMAL LOW (ref 101–111)
Creatinine, Ser: 1.17 mg/dL (ref 0.61–1.24)
GFR, EST NON AFRICAN AMERICAN: 58 mL/min — AB (ref >60)
Glucose, Bld: 135 mg/dL — ABNORMAL HIGH (ref 65–99)
POTASSIUM: 4 mmol/L (ref 3.5–5.1)
SODIUM: 140 mmol/L (ref 135–145)

## 2017-03-03 LAB — CBC
HCT: 42.6 % (ref 39.0–52.0)
Hemoglobin: 14.3 g/dL (ref 13.0–17.0)
MCH: 30.6 pg (ref 26.0–34.0)
MCHC: 33.6 g/dL (ref 30.0–36.0)
MCV: 91 fL (ref 78.0–100.0)
PLATELETS: 199 10*3/uL (ref 150–400)
RBC: 4.68 MIL/uL (ref 4.22–5.81)
RDW: 13 % (ref 11.5–15.5)
WBC: 16 10*3/uL — AB (ref 4.0–10.5)

## 2017-03-03 MED ORDER — METHYLPREDNISOLONE SODIUM SUCC 125 MG IJ SOLR
60.0000 mg | Freq: Two times a day (BID) | INTRAMUSCULAR | Status: DC
  Administered 2017-03-03 – 2017-03-04 (×2): 60 mg via INTRAVENOUS
  Filled 2017-03-03 (×2): qty 2

## 2017-03-03 MED ORDER — IPRATROPIUM-ALBUTEROL 0.5-2.5 (3) MG/3ML IN SOLN
3.0000 mL | Freq: Three times a day (TID) | RESPIRATORY_TRACT | Status: DC
  Administered 2017-03-04: 3 mL via RESPIRATORY_TRACT
  Filled 2017-03-03: qty 3

## 2017-03-03 MED ORDER — LACTULOSE 10 GM/15ML PO SOLN
20.0000 g | Freq: Two times a day (BID) | ORAL | Status: AC
  Administered 2017-03-03 – 2017-03-04 (×3): 20 g via ORAL
  Filled 2017-03-03 (×3): qty 30

## 2017-03-03 MED ORDER — POLYETHYLENE GLYCOL 3350 17 G PO PACK
17.0000 g | PACK | Freq: Two times a day (BID) | ORAL | Status: DC
  Administered 2017-03-03 – 2017-03-04 (×2): 17 g via ORAL
  Filled 2017-03-03 (×2): qty 1

## 2017-03-03 NOTE — Progress Notes (Signed)
PROGRESS NOTE    [name] [name]  ZOX:[id number] DOB: October 24, 1940 DOA: 02/27/2017 PCP: Gaynelle Arabian, MD   Brief Narrative: 76 y.o.malewith medical history significant ofCOPD, asthma,CKD-III,hypertension,gerd,anxiety, prostate cancer, OSA not on CPAP, diverticulitis, A fib on Eliquis,obesity, who presents with cough, shortness breath, wheezing or chest pain.  Assessment & Plan:   # Acute  respiratory failure with hypoxia due to +RSV and possible lobar pneumonia associated with acute COPD exacerbation: Patient failed outpatient oral antibiotic treatment.  Still complaining of coughing, mild shortness of breath not at baseline.  Plan to repeat chest x-ray today.  Continue current doxycycline, ceftriaxone and nebulization. Still having minimal wheeze, coughing, not at baseline.  Plan to continue ceftriaxone, doxycycline, nebulization and Solu-Medrol.  Reduce Solu-Medrol to 60 mg twice a day.  Continue supportive care.  Currently on room air.  #Chest pain likely due to pneumonia.  Troponin negative.  Echo with EF of 60-65% with normal wall motion.  #Hypertension: Continue current medication.  Monitor blood pressure.  #Paroxysmal atrial fibrillation: Continue Eliquis.  On flecainide.  Monitor heart rate.  #GERD: Continue Protonix  #Anxiety: Continue Ativan as needed and supportive care.  #Chronic kidney disease stage III: Serum creatinine level at baseline.  Monitor BMP.  Avoid nephrotoxins.  #Constipation and abdominal discomfort: Had one very small bowel movement yesterday.  He feels bloated and no significant bowel movement yet.  Plan for abdominal x-ray.  Continue lactulose, MiraLAX.  Patient did not have any made yesterday.  Order tap water enema. DVT prophylaxis: eliquis Code Status:full code Family Communication: No family at bedside Disposition Plan: Likely discharge home in 1-2 days    Consultants:   None  Procedures:None Antimicrobials: Doxycycline and  ceftriaxone.  Subjective: Seen and examined at bedside.  Still complaining of cough, shortness of breath and not at baseline.  Had a very small bowel movement yesterday.  Feels bloated.  No abdominal pain.  No nausea vomiting. Objective: Vitals:   03/02/17 2326 03/03/17 0300 03/03/17 0520 03/03/17 0824  BP:   (!) 121/56   Pulse:   91   Resp:   20   Temp:   98 F (36.7 C)   TempSrc:   Oral   SpO2: 94% 94% 95% 93%  Weight:      Height:        Intake/Output Summary (Last 24 hours) at 03/03/2017 1058 Last data filed at 03/03/2017 0600 Gross per 24 hour  Intake 530 ml  Output 0 ml  Net 530 ml   Filed Weights   02/27/17 1640  Weight: 112.6 kg (248 lb 3.8 oz)    Examination:  General exam: Lying in bed comfortable, not in distress. Respiratory system: Bilateral expiratory wheeze and basal crackle.  Respiratory effort normal. Cardiovascular system: Regular rate rhythm S1-S2 normal.  No pedal edema.. Gastrointestinal system: Abdomen soft, mild distention, bowel sounds positive.  Central nervous system: Alert awake and following commands. Skin: No rashes, lesions or ulcers Psychiatry: Judgement and insight appear normal. Mood & affect appropriate.     Data Reviewed: I have personally reviewed following labs and imaging studies  CBC: Recent Labs  Lab 02/27/17 1030  WBC 7.3  HGB 14.3  HCT 41.1  MCV 89.7  PLT 811   Basic Metabolic Panel: Recent Labs  Lab 02/27/17 1030  NA 139  K 3.6  CL 102  CO2 28  GLUCOSE 127*  BUN 15  CREATININE 1.20  CALCIUM 9.2   GFR: Estimated Creatinine Clearance: 60.8 mL/min (by C-G formula based on  SCr of 1.2 mg/dL). Liver Function Tests: No results for input(s): AST, ALT, ALKPHOS, BILITOT, PROT, ALBUMIN in the last 168 hours. No results for input(s): LIPASE, AMYLASE in the last 168 hours. No results for input(s): AMMONIA in the last 168 hours. Coagulation Profile: No results for input(s): INR, PROTIME in the last 168  hours. Cardiac Enzymes: Recent Labs  Lab 02/27/17 1906 02/28/17 0218  TROPONINI <0.03 <0.03   BNP (last 3 results) No results for input(s): PROBNP in the last 8760 hours. HbA1C: No results for input(s): HGBA1C in the last 72 hours. CBG: No results for input(s): GLUCAP in the last 168 hours. Lipid Profile: No results for input(s): CHOL, HDL, LDLCALC, TRIG, CHOLHDL, LDLDIRECT in the last 72 hours. Thyroid Function Tests: No results for input(s): TSH, T4TOTAL, FREET4, T3FREE, THYROIDAB in the last 72 hours. Anemia Panel: No results for input(s): VITAMINB12, FOLATE, FERRITIN, TIBC, IRON, RETICCTPCT in the last 72 hours. Sepsis Labs: No results for input(s): PROCALCITON, LATICACIDVEN in the last 168 hours.  Recent Results (from the past 240 hour(s))  Culture, blood (routine x 2) Call MD if unable to obtain prior to antibiotics being given     Status: None (Preliminary result)   Collection Time: 02/27/17  1:30 PM  Result Value Ref Range Status   Specimen Description BLOOD LEFT ANTECUBITAL  Final   Special Requests   Final    BOTTLES DRAWN AEROBIC AND ANAEROBIC Blood Culture adequate volume   Culture   Final    NO GROWTH 3 DAYS Performed at [facility], [address] [place], [place] [address]    Report Status PENDING  Incomplete  Respiratory Panel by PCR     Status: Abnormal   Collection Time: 02/27/17  1:46 PM  Result Value Ref Range Status   Adenovirus NOT DETECTED NOT DETECTED Final   Coronavirus 229E NOT DETECTED NOT DETECTED Final   Coronavirus HKU1 NOT DETECTED NOT DETECTED Final   Coronavirus NL63 NOT DETECTED NOT DETECTED Final   Coronavirus OC43 NOT DETECTED NOT DETECTED Final   Metapneumovirus NOT DETECTED NOT DETECTED Final   Rhinovirus / Enterovirus NOT DETECTED NOT DETECTED Final   Influenza A NOT DETECTED NOT DETECTED Final   Influenza B NOT DETECTED NOT DETECTED Final   Parainfluenza Virus 1 NOT DETECTED NOT DETECTED Final   Parainfluenza Virus 2 NOT  DETECTED NOT DETECTED Final   Parainfluenza Virus 3 NOT DETECTED NOT DETECTED Final   Parainfluenza Virus 4 NOT DETECTED NOT DETECTED Final   Respiratory Syncytial Virus DETECTED (A) NOT DETECTED Final    Comment: CRITICAL RESULT CALLED TO, READ BACK BY AND VERIFIED WITH: ADDISONC,RN @1945  02/27/17 BY LHOWARD    Bordetella pertussis NOT DETECTED NOT DETECTED Final   Chlamydophila pneumoniae NOT DETECTED NOT DETECTED Final   Mycoplasma pneumoniae NOT DETECTED NOT DETECTED Final    Comment: Performed at [facility], [place] [address], [place], [place] [address]         Radiology Studies: No results found.      Scheduled Meds: . apixaban  5 mg Oral BID  . calcium-vitamin D  1 tablet Oral BID  . doxycycline  100 mg Oral Q12H  . flecainide  50 mg Oral BID  . hydrochlorothiazide  12.5 mg Oral Daily  . ipratropium-albuterol  3 mL Nebulization Q4H  . irbesartan  300 mg Oral Daily  . lactulose  20 g Oral BID  . methylPREDNISolone (SOLU-MEDROL) injection  60 mg Intravenous Q12H  . montelukast  10 mg Oral QAC breakfast  . multivitamin with minerals  1 tablet Oral Q lunch  . omega-3 acid ethyl esters  1 g Oral Daily  . pantoprazole  40 mg Oral Daily  . polyethylene glycol  17 g Oral BID  . senna-docusate  1 tablet Oral QHS   Continuous Infusions: . cefTRIAXone (ROCEPHIN)  IV Stopped (03/02/17 1530)     LOS: 4 days    Rolinda Impson Tanna Furry, MD [facility] Pager [phone]  If 7PM-7AM, please contact night-coverage [email or web address] Password [facility] 03/03/2017, 10:58 AM

## 2017-03-04 DIAGNOSIS — R079 Chest pain, unspecified: Secondary | ICD-10-CM

## 2017-03-04 LAB — CULTURE, BLOOD (ROUTINE X 2)
CULTURE: NO GROWTH
SPECIAL REQUESTS: ADEQUATE

## 2017-03-04 MED ORDER — PREDNISONE 10 MG PO TABS: ORAL_TABLET | ORAL | 0 refills | Status: DC

## 2017-03-04 MED ORDER — POLYETHYLENE GLYCOL 3350 17 G PO PACK: 17.0000 g | PACK | Freq: Every day | ORAL | 0 refills | Status: DC

## 2017-03-04 MED ORDER — FLEET ENEMA 7-19 GM/118ML RE ENEM
1.0000 | ENEMA | Freq: Once | RECTAL | Status: AC
  Administered 2017-03-04: 1 via RECTAL
  Filled 2017-03-04: qty 1

## 2017-03-04 MED ORDER — SENNOSIDES-DOCUSATE SODIUM 8.6-50 MG PO TABS: 1.0000 | ORAL_TABLET | Freq: Every day | ORAL | 0 refills | Status: DC

## 2017-03-04 NOTE — Discharge Summary (Signed)
Physician Discharge Summary  [name] [name]:[id number] DOB: [birth year] DOA: 02/27/2017  PCP: Gaynelle Arabian, MD  Admit date: 02/27/2017 Discharge date: 03/04/2017  Admitted From:home Disposition:home  Recommendations for Outpatient Follow-up:  1. Follow up with PCP in 1-2 weeks 2. Please obtain BMP/CBC in one week   Home Health:no Equipment/Devices:none Discharge Condition:stable CODE STATUS:full code Diet recommendation:heart healthy  Brief/Interim Summary: 76 y.o.malewith medical history significant ofCOPD, asthma,CKD-III,hypertension,gerd,anxiety, prostate cancer, OSA not on CPAP, diverticulitis, A fib on Eliquis,obesity, who presents with cough, shortness breath, wheezing or chest pain.  # Acute  respiratory failure with hypoxia due to +RSV and possible lobar pneumonia associated with acute COPD exacerbation: Patient failed outpatient oral antibiotic treatment.  Treated with IV Solu-Medrol and changed to oral prednisone with tapering dose on discharge.  Treated with 5 days of antibiotics with clinical improvement.  Patient has mild wheezing which is much better than before.  He is not hypoxic.  Denied shortness of breath but he still has some dry cough.  Recommended to follow-up with PCP.  #Chest pain likely due to pneumonia.  Troponin negative.  Echo with EF of 60-65% with normal wall motion.  #Hypertension: Continue current medication.  Monitor blood pressure.  #Paroxysmal atrial fibrillation: Continue Eliquis.  On flecainide.  Monitor heart rate.  #GERD: Continue Protonix  #Anxiety: Continue Ativan as needed and supportive care.  #Chronic kidney disease stage III: Serum creatinine level at baseline.  Monitor BMP.  Avoid nephrotoxins.  #Constipation and abdominal discomfort:  Treated with lactulose, stool softener.  He had one small bowel movement.  Plan for enema today before discharge.  Abdomen exam benign.  He has bowel sounds positive.  Abdomen  x-ray with no acute finding.  I recommended patient to increase ambulation, hydration and continue stool softener.  Discussed with the nurse regarding edema before discharge to home today.  Patient is medically stable on discharge.   Discharge Diagnoses:  Principal Problem:   Acute and chronic respiratory failure with hypoxia (HCC) Active Problems:   Hypertension   Asthma   PAF (paroxysmal atrial fibrillation) (HCC)   Chronic anticoagulation   Anxiety   Lobar pneumonia (HCC)   COPD with acute exacerbation (HCC)   GERD (gastroesophageal reflux disease)   Chest pain   CKD (chronic kidney disease), stage III (HCC)   Constipation    Discharge Instructions  Discharge Instructions    Call MD for:  difficulty breathing, headache or visual disturbances   Complete by:  As directed    Call MD for:  extreme fatigue   Complete by:  As directed    Call MD for:  hives   Complete by:  As directed    Call MD for:  persistant dizziness or light-headedness   Complete by:  As directed    Call MD for:  persistant nausea and vomiting   Complete by:  As directed    Call MD for:  severe uncontrolled pain   Complete by:  As directed    Call MD for:  temperature >100.4   Complete by:  As directed    Diet - low sodium heart healthy   Complete by:  As directed    Increase activity slowly   Complete by:  As directed      Allergies as of 03/04/2017      Reactions   Aminophylline    Carvedilol    SOB/Fatigue   Lisinopril Cough   Tenormin [atenolol] Other (See Comments)   Fatigue, low HR   Zolpidem Tartrate  Other (See Comments)   hallucinations      Medication List    STOP taking these medications   levofloxacin 500 MG tablet Commonly known as:  LEVAQUIN     TAKE these medications   acetaminophen 325 MG tablet Commonly known as:  TYLENOL Take 2 tablets (650 mg total) by mouth every 4 (four) hours as needed for headache or mild pain.   albuterol 108 (90 Base) MCG/ACT  inhaler Commonly known as:  PROVENTIL HFA;VENTOLIN HFA Inhale 2 puffs into the lungs every 6 (six) hours as needed for wheezing or shortness of breath.   budesonide-formoterol 160-4.5 MCG/ACT inhaler Commonly known as:  SYMBICORT Inhale 1 puff into the lungs 2 (two) times daily.   Calcium Carbonate-Vitamin D 600-200 MG-UNIT Tabs Take 1 tablet by mouth 2 (two) times daily.   ELIQUIS 5 MG Tabs tablet Generic drug:  apixaban TAKE 1 TABLET BY MOUTH TWO  TIMES DAILY   Fish Oil 1000 MG Caps Take 1,000 mg by mouth 2 (two) times daily.   flecainide 50 MG tablet Commonly known as:  TAMBOCOR TAKE 1 TABLET BY MOUTH TWO  TIMES DAILY   hydrochlorothiazide 12.5 MG capsule Commonly known as:  MICROZIDE Take 12.5 mg by mouth daily.   hydroxypropyl methylcellulose / hypromellose 2.5 % ophthalmic solution Commonly known as:  ISOPTO TEARS / GONIOVISC Place 1 drop into both eyes 2 (two) times daily as needed for dry eyes. For dry eyes   LORazepam 0.5 MG tablet Commonly known as:  ATIVAN Take 0.5 mg by mouth 2 (two) times daily as needed for anxiety.   montelukast 10 MG tablet Commonly known as:  SINGULAIR Take 10 mg by mouth daily before breakfast.   Multiple Vitamins tablet Take 1 tablet by mouth daily.   nitroGLYCERIN 0.4 MG SL tablet Commonly known as:  NITROSTAT Place 1 tablet (0.4 mg total) under the tongue every 5 (five) minutes x 3 doses as needed for chest pain.   omeprazole 40 MG capsule Commonly known as:  PRILOSEC Take 40 mg by mouth at bedtime.   polyethylene glycol packet Commonly known as:  MIRALAX / GLYCOLAX Take 17 g by mouth daily.   predniSONE 10 MG tablet Commonly known as:  DELTASONE 4 tabs daily X2 days, 2 tabs daily X2 days, 1 tab daily X2 days, 0.5 tab daily X2 days.   senna-docusate 8.6-50 MG tablet Commonly known as:  Senokot-S Take 1 tablet by mouth at bedtime.   telmisartan 80 MG tablet Commonly known as:  MICARDIS Take 1 tablet (80 mg total) by  mouth daily.      Follow-up Information    Gaynelle Arabian, MD. Schedule an appointment as soon as possible for a visit in 1 week(s).   Specialty:  Family Medicine Contact information: [address], [address] [place] [address] [phone]          Allergies  Allergen Reactions  . Aminophylline   . Carvedilol     SOB/Fatigue  . Lisinopril Cough  . Tenormin [Atenolol] Other (See Comments)    Fatigue, low HR  . Zolpidem Tartrate Other (See Comments)    hallucinations    Consultations: None  Procedures/Studies: None Subjective: Seen and examined at bedside.  Denied chest pain, shortness of breath.  Has minimal cough.  Feels much better than before.  Not on oxygen.  Has constipation.  Discharge Exam: Vitals:   03/04/17 0503 03/04/17 0841  BP: 118/63   Pulse: 80   Resp: 20  Temp: 98.2 F (36.8 C)   SpO2: 95% 93%   Vitals:   03/03/17 2038 03/03/17 2113 03/04/17 0503 03/04/17 0841  BP: (!) 144/71  118/63   Pulse: 87  80   Resp: 20  20   Temp: 97.9 F (36.6 C)  98.2 F (36.8 C)   TempSrc: Oral  Oral   SpO2: 95% 93% 95% 93%  Weight:      Height:        General: Pt is alert, awake, not in acute distress Cardiovascular: RRR, S1/S2 +, no rubs, no gallops Respiratory: CTA bilaterally, no wheezing, no rhonchi Abdominal: Soft, NT, ND, bowel sounds + Extremities: no edema, no cyanosis    The results of significant diagnostics from this hospitalization (including imaging, microbiology, ancillary and laboratory) are listed below for reference.     Microbiology: Recent Results (from the past 240 hour(s))  Culture, blood (routine x 2) Call MD if unable to obtain prior to antibiotics being given     Status: None (Preliminary result)   Collection Time: 02/27/17  1:30 PM  Result Value Ref Range Status   Specimen Description BLOOD LEFT ANTECUBITAL  Final   Special Requests   Final    BOTTLES DRAWN AEROBIC AND ANAEROBIC Blood Culture adequate volume    Culture   Final    NO GROWTH 4 DAYS Performed at [facility], [address] [place], [place] [address]    Report Status PENDING  Incomplete  Respiratory Panel by PCR     Status: Abnormal   Collection Time: 02/27/17  1:46 PM  Result Value Ref Range Status   Adenovirus NOT DETECTED NOT DETECTED Final   Coronavirus 229E NOT DETECTED NOT DETECTED Final   Coronavirus HKU1 NOT DETECTED NOT DETECTED Final   Coronavirus NL63 NOT DETECTED NOT DETECTED Final   Coronavirus OC43 NOT DETECTED NOT DETECTED Final   Metapneumovirus NOT DETECTED NOT DETECTED Final   Rhinovirus / Enterovirus NOT DETECTED NOT DETECTED Final   Influenza A NOT DETECTED NOT DETECTED Final   Influenza B NOT DETECTED NOT DETECTED Final   Parainfluenza Virus 1 NOT DETECTED NOT DETECTED Final   Parainfluenza Virus 2 NOT DETECTED NOT DETECTED Final   Parainfluenza Virus 3 NOT DETECTED NOT DETECTED Final   Parainfluenza Virus 4 NOT DETECTED NOT DETECTED Final   Respiratory Syncytial Virus DETECTED (A) NOT DETECTED Final    Comment: CRITICAL RESULT CALLED TO, READ BACK BY AND VERIFIED WITH: ADDISONC,RN @1945  02/27/17 BY LHOWARD    Bordetella pertussis NOT DETECTED NOT DETECTED Final   Chlamydophila pneumoniae NOT DETECTED NOT DETECTED Final   Mycoplasma pneumoniae NOT DETECTED NOT DETECTED Final    Comment: Performed at [facility], [place] [address], [place], [place] [address]     Labs: BNP (last 3 results) No results for input(s): BNP in the last 8760 hours. Basic Metabolic Panel: Recent Labs  Lab 02/27/17 1030 03/03/17 1124  NA 139 140  K 3.6 4.0  CL 102 100*  CO2 28 28  GLUCOSE 127* 135*  BUN 15 27*  CREATININE 1.20 1.17  CALCIUM 9.2 9.5   Liver Function Tests: No results for input(s): AST, ALT, ALKPHOS, BILITOT, PROT, ALBUMIN in the last 168 hours. No results for input(s): LIPASE, AMYLASE in the last 168 hours. No results for input(s): AMMONIA in the last 168 hours. CBC: Recent Labs  Lab  02/27/17 1030 03/03/17 1124  WBC 7.3 16.0*  HGB 14.3 14.3  HCT 41.1 42.6  MCV 89.7  91.0  PLT 165 199   Cardiac Enzymes: Recent Labs  Lab 02/27/17 1906 02/28/17 0218  TROPONINI <0.03 <0.03   BNP: Invalid input(s): POCBNP CBG: No results for input(s): GLUCAP in the last 168 hours. D-Dimer No results for input(s): DDIMER in the last 72 hours. Hgb A1c No results for input(s): HGBA1C in the last 72 hours. Lipid Profile No results for input(s): CHOL, HDL, LDLCALC, TRIG, CHOLHDL, LDLDIRECT in the last 72 hours. Thyroid function studies No results for input(s): TSH, T4TOTAL, T3FREE, THYROIDAB in the last 72 hours.  Invalid input(s): FREET3 Anemia work up No results for input(s): VITAMINB12, FOLATE, FERRITIN, TIBC, IRON, RETICCTPCT in the last 72 hours. Urinalysis    Component Value Date/Time   COLORURINE AMBER (A) 04/11/2011 1007   APPEARANCEUR CLEAR 04/11/2011 1007   LABSPEC 1.024 04/11/2011 1007   PHURINE 5.5 04/11/2011 1007   GLUCOSEU NEGATIVE 04/11/2011 1007   HGBUR NEGATIVE 04/11/2011 1007   BILIRUBINUR NEGATIVE 04/11/2011 1007   KETONESUR NEGATIVE 04/11/2011 1007   PROTEINUR NEGATIVE 04/11/2011 1007   UROBILINOGEN 1.0 04/11/2011 1007   NITRITE NEGATIVE 04/11/2011 1007   LEUKOCYTESUR NEGATIVE 04/11/2011 1007   Sepsis Labs Invalid input(s): PROCALCITONIN,  WBC,  LACTICIDVEN Microbiology Recent Results (from the past 240 hour(s))  Culture, blood (routine x 2) Call MD if unable to obtain prior to antibiotics being given     Status: None (Preliminary result)   Collection Time: 02/27/17  1:30 PM  Result Value Ref Range Status   Specimen Description BLOOD LEFT ANTECUBITAL  Final   Special Requests   Final    BOTTLES DRAWN AEROBIC AND ANAEROBIC Blood Culture adequate volume   Culture   Final    NO GROWTH 4 DAYS Performed at [facility], [place] [address], [place], [place] [address]    Report Status PENDING  Incomplete  Respiratory Panel by PCR     Status:  Abnormal   Collection Time: 02/27/17  1:46 PM  Result Value Ref Range Status   Adenovirus NOT DETECTED NOT DETECTED Final   Coronavirus 229E NOT DETECTED NOT DETECTED Final   Coronavirus HKU1 NOT DETECTED NOT DETECTED Final   Coronavirus NL63 NOT DETECTED NOT DETECTED Final   Coronavirus OC43 NOT DETECTED NOT DETECTED Final   Metapneumovirus NOT DETECTED NOT DETECTED Final   Rhinovirus / Enterovirus NOT DETECTED NOT DETECTED Final   Influenza A NOT DETECTED NOT DETECTED Final   Influenza B NOT DETECTED NOT DETECTED Final   Parainfluenza Virus 1 NOT DETECTED NOT DETECTED Final   Parainfluenza Virus 2 NOT DETECTED NOT DETECTED Final   Parainfluenza Virus 3 NOT DETECTED NOT DETECTED Final   Parainfluenza Virus 4 NOT DETECTED NOT DETECTED Final   Respiratory Syncytial Virus DETECTED (A) NOT DETECTED Final    Comment: CRITICAL RESULT CALLED TO, READ BACK BY AND VERIFIED WITH: ADDISONC,RN @1945  02/27/17 BY LHOWARD    Bordetella pertussis NOT DETECTED NOT DETECTED Final   Chlamydophila pneumoniae NOT DETECTED NOT DETECTED Final   Mycoplasma pneumoniae NOT DETECTED NOT DETECTED Final    Comment: Performed at [facility], [place] [address] [place], [place] [address]     Time coordinating discharge: 31 minutes  SIGNED:   Rosita Fire, MD  [facility] 03/04/2017, 10:26 AM  If 7PM-7AM, please contact night-coverage [email or web address] Password [email or web address]

## 2017-03-25 ENCOUNTER — Ambulatory Visit
Admission: RE | Admit: 2017-03-25 | Discharge: 2017-03-25 | Disposition: A | Discharge status: Home / Self Care | Payer: Medicare Other | Source: Ambulatory Visit | Attending: Family Medicine | Admitting: Family Medicine

## 2017-03-25 ENCOUNTER — Other Ambulatory Visit: Payer: Self-pay | Admitting: Family Medicine

## 2017-03-25 DIAGNOSIS — R05 Cough: Secondary | ICD-10-CM

## 2017-03-25 DIAGNOSIS — R509 Fever, unspecified: Secondary | ICD-10-CM

## 2017-03-25 DIAGNOSIS — R059 Cough, unspecified: Secondary | ICD-10-CM

## 2017-05-22 ENCOUNTER — Encounter: Payer: Self-pay | Admitting: Pulmonary Disease

## 2017-05-22 ENCOUNTER — Ambulatory Visit (INDEPENDENT_AMBULATORY_CARE_PROVIDER_SITE_OTHER)
Admission: RE | Admit: 2017-05-22 | Discharge: 2017-05-22 | Disposition: A | Discharge status: Home / Self Care | Payer: Medicare Other | Source: Ambulatory Visit | Attending: Pulmonary Disease | Admitting: Pulmonary Disease

## 2017-05-22 ENCOUNTER — Ambulatory Visit (INDEPENDENT_AMBULATORY_CARE_PROVIDER_SITE_OTHER): Payer: Medicare Other | Admitting: Pulmonary Disease

## 2017-05-22 VITALS — BP 128/78 | HR 67 | Ht 67.0 in | Wt 259.0 lb

## 2017-05-22 DIAGNOSIS — J449 Chronic obstructive pulmonary disease, unspecified: Secondary | ICD-10-CM

## 2017-05-22 DIAGNOSIS — J181 Lobar pneumonia, unspecified organism: Secondary | ICD-10-CM | POA: Diagnosis not present

## 2017-05-22 DIAGNOSIS — R05 Cough: Secondary | ICD-10-CM

## 2017-05-22 DIAGNOSIS — J301 Allergic rhinitis due to pollen: Secondary | ICD-10-CM

## 2017-05-22 DIAGNOSIS — R059 Cough, unspecified: Secondary | ICD-10-CM

## 2017-05-22 DIAGNOSIS — J45909 Unspecified asthma, uncomplicated: Secondary | ICD-10-CM

## 2017-05-22 LAB — NITRIC OXIDE: NITRIC OXIDE: 18

## 2017-05-22 MED ORDER — BUDESONIDE-FORMOTEROL FUMARATE 160-4.5 MCG/ACT IN AERO: 2.0000 | INHALATION_SPRAY | Freq: Two times a day (BID) | RESPIRATORY_TRACT | 0 refills | Status: DC

## 2017-05-22 MED ORDER — IPRATROPIUM-ALBUTEROL 0.5-2.5 (3) MG/3ML IN SOLN: 3.0000 mL | Freq: Four times a day (QID) | RESPIRATORY_TRACT | 11 refills | Status: DC

## 2017-05-22 NOTE — Progress Notes (Signed)
Subjective:    Patient ID: [name] [name], male    DOB: 20-Apr-1940, 77 y.o.   MRN: [id number]  Synopsis: Referred in 2016 for dyspnea due to asthma based on PFT showing reversible airflow obstruction. He quit smoking around 1975; he smoked for about 30-35 years 1.5 ppd. Childhood polio, was in an iron lung  HPI Chief Complaint  Patient presents with  . Follow-up    pt has been treated for PNA X2 since last OV.  c/o worsened DOE.      [name] [name] says that he had "double pneumonia" and bronchitis and he had it a few weeks.   He had prednisone called in back in 02/2017, this didn't help so he had to go to the ER and was admitted for 6 days.  He has some cough which is productive of clear mucus.  No fever or chills.  He is short of breath as well.  However he says this is primarily when he moves.  He says that he would like some symbicort today because they are expensive.  He says that he really can't tell a difference when he is taking the symbicort.  He said that his wife took him to the ER because of a severe coughing spell.    He has congestion in the mornings in his sinuses.  He says that in general the postnasal drip has cleared up significantly.  However, he is only taking about half the dose of the Nasacort.  He is also only taking 1 puff twice a day of the Symbicort.  He is not taking Zyrtec.  Past Medical History:  Diagnosis Date  . Angina    chest pain- cardiac cath. followed in 2010, record available ,  told then that he should f/u /w Dr. Marisue Humble    . Arthritis    R knee, back    . Asthma    uses  singulair  . Cancer [facility])    prostate, melanoma- 2010, excision    . Chronic kidney disease    prostate cancer - surg. removal- 1996  . Colon polyps   . Diverticulitis   . Dysrhythmia    palpitations, followed by Dr.Ehinger, seen in prep for surgery on 03/27/2011   . GERD (gastroesophageal reflux disease)   . Hepatitis    jaundice- many yrs. ago  . Hiatal hernia   .  Hypertension   . 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 OSA (obstructive sleep apnea) 04/11/2015   Mild to moderate OSA with AHI 13.3/hr overall and AHI 36.8/hr during REM sleep.  Oxygen saturations were as low as 86% during respiratory events.  . Pneumonia    hosp. 20 yrs. ago  . Prostate cancer ([place]) 1995  . Sleep apnea    study done, 10 yrs. ago, told that he needed  CPAP but  never used       Review of Systems  Constitutional: Negative for chills, fatigue and fever.  HENT: Negative for nosebleeds, postnasal drip and rhinorrhea.   Respiratory: Negative for cough, shortness of breath and wheezing.   Cardiovascular: Negative for chest pain, palpitations and leg swelling.       Objective:   Physical Exam Vitals:   05/22/17 1410  BP: 128/78  Pulse: 67  SpO2: 97%  Weight: 259 lb (117.5 kg)  Height: 5\' 7"  (1.702 m)   RA  Gen: chronicall ill appearing HENT: OP clear, TM's clear, neck supple PULM: Wheezing bilaterally B, normal percussion CV: RRR, no mgr,  trace edema GI: BS+, soft, nontender Derm: no cyanosis or rash Psyche: normal mood and affect    Echo November 2016 echocardiogram LVEF 60-65%, moderate to severe left atrial enlargement, normal RV size and function  Imaging November 2016 chest x-ray mild atelectasis left base, likely chronic otherwise normal, cardiomegaly noted 05/2015 CT chest [facility]) . Lungs: Groundglass density right upper lobe 14 x 7 mm previously 13 x 7 mm. Image 44 series 4. Linear densities left base no change. 11/2015 CT chest [facility]: 14x71mm RUL nodules is stable.  No lymphedenopathy 10/08/2016 CT chest [facility]: 14x20mm RUL ground glass nodule unchanged. January 2019 chest x-ray images independently reviewed showing a hazy groundglass opacity in the right upper lobe  Lung function testing May 2015 PFT> pre-albuterol ratio 65%, FEV1 2.26 L (83% predicted), post albuterol FEV1 2.65 L (17% predicted, 400 mL change), total lung capacity 6.40 L (99% predicted)  disease, DLCO 28.25 (99% pred)      Assessment & Plan:    Lobar pneumonia ([place]) - Plan: DG Chest 2 View  Uncomplicated asthma, unspecified asthma severity, unspecified whether persistent - Plan: Nitric oxide  Chronic obstructive pulmonary disease, unspecified COPD type ([place]) - Plan: Ambulatory Referral for DME  Allergic rhinitis due to pollen, unspecified seasonality  Cough  Discussion: Johntavius has not been well recently.  He had pneumonia diagnosed in December and as of the last chest x-ray he still had findings of pneumonia in January.  He continues to struggle with cough and mucus production.  We need to get a chest x-ray today to make sure the pneumonia has cleared up.  His persistent cough with mucus production is likely due to the fact that I do not think he takes any of his medicines.  However, he claims to take Symbicort at half the dose that is appropriate.  He is wheezing on exam today.  Says it could be poorly controlled asthma which is causing this but I also suspect that there is some degree of allergic rhinitis also contributing as well.  Plan: Recent pneumonia: We will get a chest x-ray today to make sure this is cleared up  Asthma:, Ongoing wheezing today Take Symbicort 2 puffs twice a day through a spacer no matter how you feel Use the DuoNeb 4 times a day for the next 2 weeks Exhaled nitric oxide test today to measure the amount of inflammation in your lungs  Allergic rhinitis: Take Flonase Take Zyrtec daily (generic is OK)  6 cough: If your cough is not improved after taking the Zyrtec, increasing the Symbicort dose, and increasing the Nasacort dose then on the next visit we will consider sending you for 24-hour pH probe to see if gastroesophageal reflux disease is causing the cough.  Follow up 2 weeks.   Current Outpatient Medications:  .  acetaminophen (TYLENOL) 325 MG tablet, Take 2 tablets (650 mg total) by mouth every 4 (four) hours as needed for  headache or mild pain., Disp: , Rfl:  .  albuterol (PROVENTIL HFA;VENTOLIN HFA) 108 (90 BASE) MCG/ACT inhaler, Inhale 2 puffs into the lungs every 6 (six) hours as needed for wheezing or shortness of breath., Disp: , Rfl:  .  budesonide-formoterol (SYMBICORT) 160-4.5 MCG/ACT inhaler, Inhale 1 puff into the lungs 2 (two) times daily., Disp: 2 Inhaler, Rfl: 0 .  Calcium Carbonate-Vitamin D (CALCIUM + D) 600-200 MG-UNIT TABS, Take 1 tablet by mouth 2 (two) times daily., Disp: , Rfl:  .  ELIQUIS 5 MG TABS tablet, TAKE 1 TABLET BY  MOUTH TWO  TIMES DAILY, Disp: 180 tablet, Rfl: 3 .  flecainide (TAMBOCOR) 50 MG tablet, TAKE 1 TABLET BY MOUTH TWO  TIMES DAILY, Disp: 180 tablet, Rfl: 1 .  hydrochlorothiazide (MICROZIDE) 12.5 MG capsule, Take 12.5 mg by mouth daily., Disp: , Rfl:  .  hydroxypropyl methylcellulose (ISOPTO TEARS) 2.5 % ophthalmic solution, Place 1 drop into both eyes 2 (two) times daily as needed for dry eyes. For dry eyes, Disp: , Rfl:  .  LORazepam (ATIVAN) 0.5 MG tablet, Take 0.5 mg by mouth 2 (two) times daily as needed for anxiety., Disp: , Rfl:  .  montelukast (SINGULAIR) 10 MG tablet, Take 10 mg by mouth daily before breakfast. , Disp: , Rfl:  .  Multiple Vitamins tablet, Take 1 tablet by mouth daily., Disp: , Rfl:  .  nitroGLYCERIN (NITROSTAT) 0.4 MG SL tablet, Place 1 tablet (0.4 mg total) under the tongue every 5 (five) minutes x 3 doses as needed for chest pain., Disp: 25 tablet, Rfl: 3 .  Omega-3 Fatty Acids (FISH OIL) 1000 MG CAPS, Take 1,000 mg by mouth 2 (two) times daily., Disp: , Rfl:  .  omeprazole (PRILOSEC) 40 MG capsule, Take 40 mg by mouth at bedtime. , Disp: , Rfl:  .  polyethylene glycol (MIRALAX / GLYCOLAX) packet, Take 17 g by mouth daily., Disp: 14 each, Rfl: 0 .  senna-docusate (SENOKOT-S) 8.6-50 MG tablet, Take 1 tablet by mouth at bedtime., Disp: 30 tablet, Rfl: 0 .  telmisartan (MICARDIS) 80 MG tablet, Take 1 tablet (80 mg total) by mouth daily., Disp: 90 tablet,  Rfl: 2 .  budesonide-formoterol (SYMBICORT) 160-4.5 MCG/ACT inhaler, Inhale 2 puffs into the lungs 2 (two) times daily., Disp: 2 Inhaler, Rfl: 0 .  ipratropium-albuterol (DUONEB) 0.5-2.5 (3) MG/3ML SOLN, Take 3 mLs by nebulization 4 (four) times daily. DX: J44.9, Disp: 360 mL, Rfl: 11

## 2017-05-22 NOTE — Patient Instructions (Signed)
Recent pneumonia: We will get a chest x-ray today to make sure this is cleared up  Asthma:, Ongoing wheezing today Take Symbicort 2 puffs twice a day through a spacer no matter how you feel Use the DuoNeb 4 times a day for the next 2 weeks Exhaled nitric oxide test today to measure the amount of inflammation in your lungs  Allergic rhinitis: Take Flonase Take Zyrtec daily (generic is OK)  6 cough: If your cough is not improved after taking the Zyrtec, increasing the Symbicort dose, and increasing the Nasacort dose then on the next visit we will consider sending you for 24-hour pH probe to see if gastroesophageal reflux disease is causing the cough.  Follow up 2 weeks.

## 2017-05-29 ENCOUNTER — Telehealth: Payer: Self-pay | Admitting: Pulmonary Disease

## 2017-05-29 NOTE — Telephone Encounter (Signed)
MR has signed this form and I have faxed it to [place] Patient and have received confirmation that fax was received

## 2017-05-29 NOTE — Telephone Encounter (Signed)
I have made Rodena Piety aware of this, as she has CMN.  Rodena Piety will give MR CMN for signature.  Will route to Bernard to f/u on

## 2017-05-29 NOTE — Telephone Encounter (Signed)
Per MR-Yes he is willing to sign CMN.

## 2017-05-29 NOTE — Telephone Encounter (Signed)
Spoke to [place], who states CMN for has been placed in BQ's CMN folder for signature when he returns to office. BQ will not be back in office until 06/03/17 . Pt is requesting that CMN be addressed prior to 06/03/17. So he can start neb treatments.   MR please advise if you are willing to sign CMN?

## 2017-06-03 ENCOUNTER — Telehealth: Payer: Self-pay | Admitting: Pulmonary Disease

## 2017-06-03 NOTE — Telephone Encounter (Signed)
Attempted to contact pt. No answer, no option to leave a message. Will try back.  

## 2017-06-04 NOTE — Telephone Encounter (Signed)
Called and spoke with pt who stated he did cancel his appt that was scheduled with BQ tomorrow, 06/05/17 due to not yet receiving his neb machine until about 30 min ago.  Pt rescheduled his appt with TP 06/19/17.  Stated to pt if he needed anything before that visit to call our office.  Pt expressed understanding. Nothing further needed at this time.

## 2017-06-05 ENCOUNTER — Ambulatory Visit: Payer: Medicare Other | Admitting: Adult Health

## 2017-06-12 ENCOUNTER — Other Ambulatory Visit: Payer: Self-pay | Admitting: Interventional Cardiology

## 2017-06-19 ENCOUNTER — Ambulatory Visit: Payer: Medicare Other | Admitting: Adult Health

## 2017-06-19 ENCOUNTER — Encounter: Payer: Self-pay | Admitting: Adult Health

## 2017-06-19 DIAGNOSIS — J181 Lobar pneumonia, unspecified organism: Secondary | ICD-10-CM

## 2017-06-19 DIAGNOSIS — J45901 Unspecified asthma with (acute) exacerbation: Secondary | ICD-10-CM | POA: Diagnosis not present

## 2017-06-19 DIAGNOSIS — J441 Chronic obstructive pulmonary disease with (acute) exacerbation: Secondary | ICD-10-CM

## 2017-06-19 MED ORDER — BUDESONIDE-FORMOTEROL FUMARATE 160-4.5 MCG/ACT IN AERO: 2.0000 | INHALATION_SPRAY | Freq: Two times a day (BID) | RESPIRATORY_TRACT | 0 refills | Status: DC

## 2017-06-19 NOTE — Addendum Note (Signed)
Addended by: Parke Poisson E on: 06/19/2017 05:32 PM   Modules accepted: Orders

## 2017-06-19 NOTE — Assessment & Plan Note (Signed)
Recent slow to resolve exacerbation with associated pneumonia.  Patient is now improved and returning back to his baseline.  He is continue on Symbicort twice daily.  Follow-up in 3 months and as needed

## 2017-06-19 NOTE — Patient Instructions (Addendum)
Continue on Symbicort 2 puffs twice daily, rinse after use Mucinex DM twice daily as needed for cough and congestion Follow up with  Dr. Lake Bells in  3 months and As needed

## 2017-06-19 NOTE — Addendum Note (Signed)
Addended by: Parke Poisson E on: 06/19/2017 05:36 PM   Modules accepted: Orders

## 2017-06-19 NOTE — Progress Notes (Signed)
@Patient  ID: [name] [name], male    DOB: 1940/05/17, 77 y.o.   MRN: [id number]  Chief Complaint  Patient presents with  . Follow-up    Referring provider: Gaynelle Arabian, MD  HPI: 77 year old male former smoker followed for COPD /asthma Medical history significant for childhood polio was in iron long  TEST   Echo November 2016 echocardiogram LVEF 60-65%, moderate to severe left atrial enlargement, normal RV size and function  Imaging November 2016 chest x-ray mild atelectasis left base, likely chronic otherwise normal, cardiomegaly noted 05/2015 CT chest [facility]) . Lungs: Groundglass density right upper lobe 14 x 7 mm previously 13 x 7 mm. Image 44 series 4. Linear densities left base no change. 11/2015 CT chest [facility]: 14x15mm RUL nodules is stable.  No lymphedenopathy 10/08/2016 CT chest [facility]: 14x17mm RUL ground glass nodule unchanged. January 2019 chest x-ray images independently reviewed showing a hazy groundglass opacity in the right upper lobe  Lung function testing May 2015 PFT> pre-albuterol ratio 65%, FEV1 2.26 L (83% predicted), post albuterol FEV1 2.65 L (17% predicted, 400 mL change), total lung capacity 6.40 L (99% predicted) disease, DLCO 28.25 (99% pred)  06/19/2017 Follow up ; Asthma /COPD  Patient returns for a one-month follow-up.  Patient had been having difficulties with a slow to resolve COPD asthma exacerbation with recent pneumonia.  Last visit DuoNeb was added to his regimen of Symbicort.  Patient says that he did not like this medication it made him feel worse.  Since last visit patient says he is starting to feel much better.  His cough and congestion have decreased.  His energy level and activity tolerance has improved.  He remains on Symbicort twice daily.  Chest x-ray Last visit showed resolved right-sided pneumonia.Marland Kitchen He denies any chest pain, fever, abdominal pain, nausea vomiting diarrhea.  She does have diastolic heart failure.  Has chronic  lower extremity swelling.  Patient says his swelling has been about the same.  He gets worse in the afternoon.  He takes hydrochlorothiazide occasionally.  We discussed a low-salt diet.  And using his diuretic on a more consistent basis  Allergies  Allergen Reactions  . Aminophylline   . Carvedilol     SOB/Fatigue  . Lisinopril Cough  . Tenormin [Atenolol] Other (See Comments)    Fatigue, low HR  . Zolpidem Tartrate Other (See Comments)    hallucinations    Immunization History  Administered Date(s) Administered  . Influenza Split 12/27/2014  . Influenza, High Dose Seasonal PF 12/03/2015, 12/21/2016  . Pneumococcal Conjugate-13 08/27/2014  . Pneumococcal Polysaccharide-23 02/25/2014    Past Medical History:  Diagnosis Date  . Angina    chest pain- cardiac cath. followed in 2010, record available ,  told then that he should f/u /w Dr. Marisue Humble    . Arthritis    R knee, back    . Asthma    uses  singulair  . Cancer [facility])    prostate, melanoma- 2010, excision    . Chronic kidney disease    prostate cancer - surg. removal- 1996  . Colon polyps   . Diverticulitis   . Dysrhythmia    palpitations, followed by Dr.Ehinger, seen in prep for surgery on 03/27/2011   . GERD (gastroesophageal reflux disease)   . Hepatitis    jaundice- many yrs. ago  . Hiatal hernia   . Hypertension   . 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 OSA (obstructive sleep apnea) 04/11/2015   Mild to moderate OSA  with AHI 13.3/hr overall and AHI 36.8/hr during REM sleep.  Oxygen saturations were as low as 86% during respiratory events.  . Pneumonia    hosp. 20 yrs. ago  . Prostate cancer ([place]) 1995  . Sleep apnea    study done, 10 yrs. ago, told that he needed  CPAP but  never used     Tobacco History: Social History   Tobacco Use  Smoking Status Former Smoker  . Packs/day: 1.50  . Years: 30.00  . Pack years: 45.00  . Types: Cigarettes  . Last attempt to quit: 04/11/1983  . Years since quitting: 34.2  Smokeless  Tobacco Never Used   Counseling given: Not Answered   Outpatient Encounter Medications as of 06/19/2017  Medication Sig  . acetaminophen (TYLENOL) 325 MG tablet Take 2 tablets (650 mg total) by mouth every 4 (four) hours as needed for headache or mild pain.  Marland Kitchen albuterol (PROVENTIL HFA;VENTOLIN HFA) 108 (90 BASE) MCG/ACT inhaler Inhale 2 puffs into the lungs every 6 (six) hours as needed for wheezing or shortness of breath.  . budesonide-formoterol (SYMBICORT) 160-4.5 MCG/ACT inhaler Inhale 2 puffs into the lungs 2 (two) times daily.  . Calcium Carbonate-Vitamin D (CALCIUM + D) 600-200 MG-UNIT TABS Take 1 tablet by mouth 2 (two) times daily.  Marland Kitchen ELIQUIS 5 MG TABS tablet TAKE 1 TABLET BY MOUTH TWO  TIMES DAILY  . flecainide (TAMBOCOR) 50 MG tablet Take 1 tablet (50 mg total) by mouth 2 (two) times daily. Please make yearly appt with Dr. Tamala Julian for August for future refills. 1st attempt  . hydrochlorothiazide (MICROZIDE) 12.5 MG capsule Take 12.5 mg by mouth daily.  . hydroxypropyl methylcellulose (ISOPTO TEARS) 2.5 % ophthalmic solution Place 1 drop into both eyes 2 (two) times daily as needed for dry eyes. For dry eyes  . LORazepam (ATIVAN) 0.5 MG tablet Take 0.5 mg by mouth 2 (two) times daily as needed for anxiety.  . montelukast (SINGULAIR) 10 MG tablet Take 10 mg by mouth daily before breakfast.   . Multiple Vitamins tablet Take 1 tablet by mouth daily.  . nitroGLYCERIN (NITROSTAT) 0.4 MG SL tablet Place 1 tablet (0.4 mg total) under the tongue every 5 (five) minutes x 3 doses as needed for chest pain.  . Omega-3 Fatty Acids (FISH OIL) 1000 MG CAPS Take 1,000 mg by mouth 2 (two) times daily.  Marland Kitchen omeprazole (PRILOSEC) 40 MG capsule Take 40 mg by mouth at bedtime.   . polyethylene glycol (MIRALAX / GLYCOLAX) packet Take 17 g by mouth daily.  Marland Kitchen senna-docusate (SENOKOT-S) 8.6-50 MG tablet Take 1 tablet by mouth at bedtime.  Marland Kitchen telmisartan (MICARDIS) 80 MG tablet Take 1 tablet (80 mg total) by mouth  daily.  Marland Kitchen ipratropium-albuterol (DUONEB) 0.5-2.5 (3) MG/3ML SOLN Take 3 mLs by nebulization 4 (four) times daily. DX: J44.9 (Patient not taking: Reported on 06/19/2017)  . [DISCONTINUED] budesonide-formoterol (SYMBICORT) 160-4.5 MCG/ACT inhaler Inhale 1 puff into the lungs 2 (two) times daily.   No facility-administered encounter medications on file as of 06/19/2017.      Review of Systems  Constitutional:   No  weight loss, night sweats,  Fevers, chills, fatigue, or  lassitude.  HEENT:   No headaches,  Difficulty swallowing,  Tooth/dental problems, or  Sore throat,                No sneezing, itching, ear ache,  +nasal congestion, post nasal drip,   CV:  No chest pain,  Orthopnea, PND,  , anasarca,  dizziness, palpitations, syncope.   GI  No heartburn, indigestion, abdominal pain, nausea, vomiting, diarrhea, change in bowel habits, loss of appetite, bloody stools.   Resp:  No chest wall deformity  Skin: no rash or lesions.  GU: no dysuria, change in color of urine, no urgency or frequency.  No flank pain, no hematuria   MS:  No joint pain or swelling.  No decreased range of motion.  No back pain.    Physical Exam  BP 134/74 (BP Location: Left Arm, Cuff Size: Large)   Pulse 70   Ht 5\' 7"  (1.702 m)   Wt 250 lb 9.6 oz (113.7 kg)   SpO2 98%   BMI 39.25 kg/m   GEN: A/Ox3; pleasant , NAD, elderly    HEENT:  [place]/AT,  EACs-clear, TMs-wnl, NOSE-clear, THROAT-clear, no lesions, no postnasal drip or exudate noted.   NECK:  Supple w/ fair ROM; no JVD; normal carotid impulses w/o bruits; no thyromegaly or nodules palpated; no lymphadenopathy.    RESP  Clear  P & A; w/o, wheezes/ rales/ or rhonchi. no accessory muscle use, no dullness to percussion  CARD:  RRR, no m/r/g, tr -1 +  peripheral edema, pulses intact, no cyanosis or clubbing.  GI:   Soft & nt; nml bowel sounds; no organomegaly or masses detected.   Musco: Warm bil, no deformities or joint swelling noted.   Neuro:  alert, no focal deficits noted.    Skin: Warm, no lesions or rashes    Lab Results:  CBC  BMET  BNP  Imaging: Dg Chest 2 View  Result Date: 05/22/2017 CLINICAL DATA:  Follow-up pneumonia EXAM: CHEST - 2 VIEW COMPARISON:  03/25/2017 FINDINGS: Resolved right-sided pneumonia. Normal heart size when accounting for apical fat pad. No edema, effusion, or pneumothorax. Chronic mild streaky density at the bases on the lateral view, likely scarring. Negative aortic and hilar contours. Spondylosis. IMPRESSION: Resolved right-sided pneumonia.  No evidence of active disease. Electronically Signed   By: Monte Fantasia M.D.   On: 05/22/2017 16:57     Assessment & Plan:   Asthma, chronic obstructive, with acute exacerbation (HCC) Recent slow to resolve exacerbation with associated pneumonia.  Patient is now improved and returning back to his baseline.  He is continue on Symbicort twice daily.  Follow-up in 3 months and as needed  Lobar pneumonia [facility]) Resolved clinically and on x-ray     Rexene Edison, NP 06/19/2017

## 2017-06-19 NOTE — Assessment & Plan Note (Signed)
Resolved clinically and on x-ray

## 2017-06-22 NOTE — Progress Notes (Signed)
Reviewed, agree 

## 2017-10-21 ENCOUNTER — Other Ambulatory Visit: Payer: Self-pay | Admitting: Physician Assistant

## 2017-10-21 DIAGNOSIS — R1032 Left lower quadrant pain: Secondary | ICD-10-CM

## 2017-10-28 ENCOUNTER — Ambulatory Visit
Admission: RE | Admit: 2017-10-28 | Discharge: 2017-10-28 | Disposition: A | Discharge status: Home / Self Care | Payer: Medicare Other | Source: Ambulatory Visit | Attending: Physician Assistant | Admitting: Physician Assistant

## 2017-10-28 DIAGNOSIS — R1032 Left lower quadrant pain: Secondary | ICD-10-CM

## 2017-10-28 MED ORDER — IOPAMIDOL (ISOVUE-300) INJECTION 61%
100.0000 mL | Freq: Once | INTRAVENOUS | Status: AC | PRN
  Administered 2017-10-28: 100 mL via INTRAVENOUS

## 2017-12-03 NOTE — Progress Notes (Signed)
Cardiology Office Note:    Date:  12/04/2017   ID:  [name] [name] Sep 10, 1940, MRN [id number]  PCP:  Gaynelle Arabian, MD  Cardiologist:  No primary care provider on file.   Referring MD: Gaynelle Arabian, MD   Chief Complaint  Patient presents with  . Atrial Fibrillation    History of Present Illness:    [name] [name] is a 77 y.o. male with a hx of paroxysmal atrial fibrillation, recurring chest discomfort, hypertension, and chronic kidney disease.   Symptomatic.  No episodes of atrial fib.  Denies orthopnea, PND, and bleeding on anticoagulation therapy.   Past Medical History:  Diagnosis Date  . Angina    chest pain- cardiac cath. followed in 2010, record available ,  told then that he should f/u /w Dr. Marisue Humble    . Arthritis    R knee, back    . Asthma    uses  singulair  . Cancer [facility])    prostate, melanoma- 2010, excision    . Chronic kidney disease    prostate cancer - surg. removal- 1996  . Colon polyps   . Diverticulitis   . Dysrhythmia    palpitations, followed by Dr.Ehinger, seen in prep for surgery on 03/27/2011   . GERD (gastroesophageal reflux disease)   . Hepatitis    jaundice- many yrs. ago  . Hiatal hernia   . Hypertension   . Melanoma ([place])    Face  . OSA (obstructive sleep apnea) 04/11/2015   Mild to moderate OSA with AHI 13.3/hr overall and AHI 36.8/hr during REM sleep.  Oxygen saturations were as low as 86% during respiratory events.  . Pneumonia    hosp. 20 yrs. ago  . Prostate cancer ([place]) 1995  . Sleep apnea    study done, 10 yrs. ago, told that he needed  CPAP but  never used     Past Surgical History:  Procedure Laterality Date  . CARDIAC CATHETERIZATION     2010  . CARDIAC CATHETERIZATION N/A 10/22/2015   Procedure: Left Heart Cath and Coronary Angiography;  Surgeon: Nelva Bush, MD;  Location: [place] CV LAB;  Service: Cardiovascular;  Laterality: N/A;  . FRACTURE SURGERY     L wrist, hardware- 1982  . JOINT  REPLACEMENT     L knee, 2009  . LEFT HEART CATHETERIZATION WITH CORONARY ANGIOGRAM N/A 09/08/2011   Procedure: LEFT HEART CATHETERIZATION WITH CORONARY ANGIOGRAM;  Surgeon: Sinclair Grooms, MD;  Location: [facility] CATH LAB;  Service: Cardiovascular;  Laterality: N/A;  . PROSTATECTOMY     for ca  . TONSILLECTOMY     as child  . TOTAL KNEE ARTHROPLASTY  04/22/2011   Procedure: TOTAL KNEE ARTHROPLASTY;  Surgeon: Garald Balding, MD;  Location: [place];  Service: Orthopedics;  Laterality: Right;    Current Medications: Current Meds  Medication Sig  . acetaminophen (TYLENOL) 325 MG tablet Take 2 tablets (650 mg total) by mouth every 4 (four) hours as needed for headache or mild pain.  Marland Kitchen albuterol (PROVENTIL HFA;VENTOLIN HFA) 108 (90 BASE) MCG/ACT inhaler Inhale 2 puffs into the lungs every 6 (six) hours as needed for wheezing or shortness of breath.  . budesonide-formoterol (SYMBICORT) 160-4.5 MCG/ACT inhaler Inhale 2 puffs into the lungs 2 (two) times daily.  . Calcium Carbonate-Vitamin D (CALCIUM + D) 600-200 MG-UNIT TABS Take 1 tablet by mouth 2 (two) times daily.  Marland Kitchen ELIQUIS 5 MG TABS tablet TAKE 1 TABLET BY MOUTH TWO  TIMES DAILY  .  flecainide (TAMBOCOR) 50 MG tablet Take 1 tablet (50 mg total) by mouth 2 (two) times daily. Please make yearly appt with Dr. Tamala Julian for August for future refills. 1st attempt  . hydrochlorothiazide (MICROZIDE) 12.5 MG capsule Take 12.5 mg by mouth daily.  . hydroxypropyl methylcellulose (ISOPTO TEARS) 2.5 % ophthalmic solution Place 1 drop into both eyes 2 (two) times daily as needed for dry eyes. For dry eyes  . LORazepam (ATIVAN) 0.5 MG tablet Take 0.5 mg by mouth 2 (two) times daily as needed for anxiety.  . montelukast (SINGULAIR) 10 MG tablet Take 10 mg by mouth daily before breakfast.   . Multiple Vitamins tablet Take 1 tablet by mouth daily.  . nitroGLYCERIN (NITROSTAT) 0.4 MG SL tablet Place 1 tablet (0.4 mg total) under the tongue every 5 (five) minutes x 3  doses as needed for chest pain.  . Omega-3 Fatty Acids (FISH OIL) 1000 MG CAPS Take 1,000 mg by mouth 2 (two) times daily.  Marland Kitchen omeprazole (PRILOSEC) 40 MG capsule Take 40 mg by mouth at bedtime.   . polyethylene glycol (MIRALAX / GLYCOLAX) packet Take 17 g by mouth daily.  Marland Kitchen senna-docusate (SENOKOT-S) 8.6-50 MG tablet Take 1 tablet by mouth at bedtime.  Marland Kitchen telmisartan (MICARDIS) 80 MG tablet Take 1 tablet (80 mg total) by mouth daily.     Allergies:   Aminophylline; Carvedilol; Lisinopril; Tenormin [atenolol]; and Zolpidem tartrate   Social History   Socioeconomic History  . Marital status: Married    Spouse name: Not on file  . Number of children: Not on file  . Years of education: Not on file  . Highest education level: Not on file  Occupational History  . Not on file  Social Needs  . Financial resource strain: Not on file  . Food insecurity:    Worry: Not on file    Inability: Not on file  . Transportation needs:    Medical: Not on file    Non-medical: Not on file  Tobacco Use  . Smoking status: Former Smoker    Packs/day: 1.50    Years: 30.00    Pack years: 45.00    Types: Cigarettes    Last attempt to quit: 04/11/1983    Years since quitting: 34.6  . Smokeless tobacco: Never Used  Substance and Sexual Activity  . Alcohol use: No    Alcohol/week: 0.0 standard drinks  . Drug use: No  . Sexual activity: Never    Birth control/protection: Condom, None  Lifestyle  . Physical activity:    Days per week: Not on file    Minutes per session: Not on file  . Stress: Not on file  Relationships  . Social connections:    Talks on phone: Not on file    Gets together: Not on file    Attends religious service: Not on file    Active member of club or organization: Not on file    Attends meetings of clubs or organizations: Not on file    Relationship status: Not on file  Other Topics Concern  . Not on file  Social History Narrative  . Not on file     Family History: The  patient's family history includes Alzheimer's disease in his father; Breast cancer in his mother; Crohn's disease in his brother; Heart disease in his brother; Hypertension in his brother. There is no history of Anesthesia problems, Hypotension, Malignant hyperthermia, or Pseudochol deficiency.  ROS:   Please see the history of present illness.  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  All other systems reviewed and are negative.  EKGs/Labs/Other Studies Reviewed:    The following studies were reviewed today: None  EKG:  EKG is  ordered today.  The ekg ordered today demonstrates normal sinus rhythm with normal appearance.  Recent Labs: 03/03/2017: BUN 27; Creatinine, Ser 1.17; Hemoglobin 14.3; Platelets 199; Potassium 4.0; Sodium 140  Recent Lipid Panel    Component Value Date/Time   CHOL 182 02/28/2017 0218   TRIG 52 02/28/2017 0218   HDL 52 02/28/2017 0218   CHOLHDL 3.5 02/28/2017 0218   VLDL 10 02/28/2017 0218   LDLCALC 120 (H) 02/28/2017 0218    Physical Exam:    VS:  BP (!) 142/70   Pulse 64   Ht 5\' 7"  (1.702 m)   Wt 248 lb 9.6 oz (112.8 kg)   BMI 38.94 kg/m     Wt Readings from Last 3 Encounters:  12/04/17 248 lb 9.6 oz (112.8 kg)  06/19/17 250 lb 9.6 oz (113.7 kg)  05/22/17 259 lb (117.5 kg)     GEN: Obese.  Well nourished, well developed in no acute distress HEENT: Normal NECK: No JVD. LYMPHATICS: No lymphadenopathy CARDIAC: RRR, no murmur, no gallop, no edema. VASCULAR: 2+ bilateral radial pulses. no bruits. RESPIRATORY:  Clear to auscultation without rales, wheezing or rhonchi  ABDOMEN: Soft, non-tender, non-distended, No pulsatile mass, MUSCULOSKELETAL: No deformity  SKIN: Warm and dry NEUROLOGIC:  Alert and oriented x 3 PSYCHIATRIC:  Normal affect   ASSESSMENT:    1. PAF (paroxysmal atrial fibrillation) ([place])   2. Chronic anticoagulation   3. CKD (chronic kidney disease), stage III ([place])   4. COPD with acute exacerbation ([place])    PLAN:    In order  of problems listed above:  1. Asymptomatic.  Currently in sinus rhythm.  On anticoagulation therapy. 2. No bleeding complications on apixaban. 3. Not evaluated 4. Not evaluated  Clinical follow-up in 1 year.  Continue same therapy and remain active.      Medication Adjustments/Labs and Tests Ordered: Current medicines are reviewed at length with the patient today.  Concerns regarding medicines are outlined above.  Orders Placed This Encounter  Procedures  . EKG 12-Lead   No orders of the defined types were placed in this encounter.   There are no Patient Instructions on file for this visit.   Signed, Sinclair Grooms, MD  12/04/2017 3:46 PM    [place]

## 2017-12-04 ENCOUNTER — Ambulatory Visit: Payer: Medicare Other | Admitting: Interventional Cardiology

## 2017-12-04 ENCOUNTER — Encounter: Payer: Self-pay | Admitting: Interventional Cardiology

## 2017-12-04 VITALS — BP 142/70 | HR 64 | Ht 67.0 in | Wt 248.6 lb

## 2017-12-04 DIAGNOSIS — N183 Chronic kidney disease, stage 3 unspecified: Secondary | ICD-10-CM

## 2017-12-04 DIAGNOSIS — I480 Paroxysmal atrial fibrillation: Secondary | ICD-10-CM

## 2017-12-04 DIAGNOSIS — J441 Chronic obstructive pulmonary disease with (acute) exacerbation: Secondary | ICD-10-CM

## 2017-12-04 DIAGNOSIS — Z7901 Long term (current) use of anticoagulants: Secondary | ICD-10-CM | POA: Diagnosis not present

## 2017-12-04 NOTE — Patient Instructions (Signed)

## 2017-12-11 ENCOUNTER — Ambulatory Visit (INDEPENDENT_AMBULATORY_CARE_PROVIDER_SITE_OTHER): Payer: Self-pay

## 2017-12-11 ENCOUNTER — Encounter (INDEPENDENT_AMBULATORY_CARE_PROVIDER_SITE_OTHER): Payer: Self-pay | Admitting: Orthopaedic Surgery

## 2017-12-11 ENCOUNTER — Ambulatory Visit (INDEPENDENT_AMBULATORY_CARE_PROVIDER_SITE_OTHER): Payer: Medicare Other | Admitting: Orthopaedic Surgery

## 2017-12-11 VITALS — BP 156/74 | HR 59 | Ht 67.0 in | Wt 248.0 lb

## 2017-12-11 DIAGNOSIS — M25572 Pain in left ankle and joints of left foot: Secondary | ICD-10-CM

## 2017-12-11 NOTE — Progress Notes (Signed)
Office Visit Note   Patient: [name] [name]           Date of Birth: 1941-02-28           MRN: [id number] Visit Date: 12/11/2017              Requested by: Gaynelle Arabian, MD [address] [place], [place] [address] PCP: Gaynelle Arabian, MD   Assessment & Plan: Visit Diagnoses:  1. Pain in left ankle and joints of left foot     Plan: [name] [name] been experiencing pain along the lateral aspect of his left foot when he is up and about and active.  Also had some mild left ankle pain.  No specific injury or trauma.  X-rays were negative per I think he has a mild sprain of his lateral ankle and possibly he has brevis tendon strain.  We will try an ankle support  Follow-Up Instructions: Return if symptoms worsen or fail to improve.   Orders:  Orders Placed This Encounter  Procedures  . XR Foot Complete Left  . XR Ankle 2 Views Left   No orders of the defined types were placed in this encounter.     Procedures: No procedures performed   Clinical Data: No additional findings.   Subjective: Chief Complaint  Patient presents with  . New Patient (Initial Visit)    LEFT FOOT PAIN  [name] [name] been very active around his home and has developed some pain along the lateral aspect of his left foot.  He denies any injury or trauma pain seems to be in the area at the base the fifth metatarsal.  No skin changes.  HPI  Review of Systems  Constitutional: Negative for fatigue and fever.  HENT: Negative for ear pain.   Eyes: Negative for pain.  Respiratory: Negative for cough and shortness of breath.   Cardiovascular: Positive for leg swelling.  Gastrointestinal: Negative for constipation and diarrhea.  Genitourinary: Negative for difficulty urinating.  Musculoskeletal: Negative for back pain and neck pain.  Skin: Negative for rash.  Allergic/Immunologic: Negative for food allergies.  Neurological: Positive for weakness.  Hematological: Does not bruise/bleed  easily.  Psychiatric/Behavioral: Positive for sleep disturbance.     Objective: Vital Signs: BP (!) 156/74 (BP Location: Left Arm, Patient Position: Sitting, Cuff Size: Normal)   Pulse (!) 59   Ht 5\' 7"  (1.702 m)   Wt 248 lb (112.5 kg)   BMI 38.84 kg/m   Physical Exam  Constitutional: He is oriented to person, place, and time. He appears well-developed and well-nourished.  HENT:  Mouth/Throat: Oropharynx is clear and moist.  Eyes: Pupils are equal, round, and reactive to light. EOM are normal.  Pulmonary/Chest: Effort normal.  Neurological: He is alert and oriented to person, place, and time.  Skin: Skin is warm and dry.  Psychiatric: He has a normal mood and affect. His behavior is normal.    Ortho Exam left foot exam with minimal tenderness at the base of the fifth metatarsal.  Normal abduction and abduction of the foot.  +1 pulses.  Sensation intact.  Also some tenderness over the anterior talofibular ligament.  No pain at the medial lateral malleoli or over the Achilles tendon.  Does have some decrease in the arch with weightbearing.  Specialty Comments:  No specialty comments available.  Imaging: Xr Foot Complete Left  Result Date: 12/11/2017 Films of the left foot were obtained in 3 projections.  Mr. holidays pain is localized in the  area of the base of the fifth metatarsal.  No abnormalities identified.  Mild degenerative changes identified at the midtarsal joints.  There is a plantar heel spur that is asymptomatic.  Xr Ankle 2 Views Left  Result Date: 12/11/2017 Films of the left ankle obtained in 2 projections.  Joint spaces well-maintained.  No evidence of fracture integrity intact.  No acute changes    PMFS History: Patient Active Problem List   Diagnosis Date Noted  . Constipation   . Lobar pneumonia ([place]) 02/27/2017  . COPD with acute exacerbation ([place]) 02/27/2017  . GERD (gastroesophageal reflux disease) 02/27/2017  . CKD (chronic kidney disease), stage  III ([place]) 02/27/2017  . Acute and chronic respiratory failure with hypoxia ([place]) 02/27/2017  . Anxiety 10/30/2015  . Dyspnea 08/20/2015  . Solitary pulmonary nodule 05/18/2015  . OSA (obstructive sleep apnea) 04/11/2015  . Chronic anticoagulation 03/17/2015  . Asthma, chronic obstructive, with acute exacerbation ([place]) 02/26/2015  . PAF (paroxysmal atrial fibrillation) ([place]) 01/26/2015  . Postoperative anemia due to acute blood loss 04/24/2011  . Osteoarthritis of knee 04/23/2011  . Hypertension 04/23/2011  . History of prostate cancer 04/23/2011  . Melanoma ([place]) 04/23/2011  . Hiatal hernia 04/23/2011  . Asthma 04/23/2011  . Obesity, Class II, BMI 35-39.9, with comorbidity ([place]) 04/23/2011   Past Medical History:  Diagnosis Date  . Angina    chest pain- cardiac cath. followed in 2010, record available ,  told then that he should f/u /w Dr. Marisue Humble    . Arthritis    R knee, back    . Asthma    uses  singulair  . Cancer [facility])    prostate, melanoma- 2010, excision    . Chronic kidney disease    prostate cancer - surg. removal- 1996  . Colon polyps   . Diverticulitis   . Dysrhythmia    palpitations, followed by Dr.Ehinger, seen in prep for surgery on 03/27/2011   . GERD (gastroesophageal reflux disease)   . Hepatitis    jaundice- many yrs. ago  . Hiatal hernia   . Hypertension   . Melanoma ([place])    Face  . OSA (obstructive sleep apnea) 04/11/2015   Mild to moderate OSA with AHI 13.3/hr overall and AHI 36.8/hr during REM sleep.  Oxygen saturations were as low as 86% during respiratory events.  . Pneumonia    hosp. 20 yrs. ago  . Prostate cancer ([place]) 1995  . Sleep apnea    study done, 10 yrs. ago, told that he needed  CPAP but  never used     Family History  Problem Relation Age of Onset  . Crohn's disease Brother   . Breast cancer Mother   . Alzheimer's disease Father   . Heart disease Brother   . Hypertension Brother   . Anesthesia problems Neg Hx   . Hypotension  Neg Hx   . Malignant hyperthermia Neg Hx   . Pseudochol deficiency Neg Hx     Past Surgical History:  Procedure Laterality Date  . CARDIAC CATHETERIZATION     2010  . CARDIAC CATHETERIZATION N/A 10/22/2015   Procedure: Left Heart Cath and Coronary Angiography;  Surgeon: Nelva Bush, MD;  Location: [place] CV LAB;  Service: Cardiovascular;  Laterality: N/A;  . FRACTURE SURGERY     L wrist, hardware- 1982  . JOINT REPLACEMENT     L knee, 2009  . LEFT HEART CATHETERIZATION WITH CORONARY ANGIOGRAM N/A 09/08/2011   Procedure: LEFT HEART CATHETERIZATION WITH CORONARY ANGIOGRAM;  Surgeon: Sinclair Grooms, MD;  Location: [facility] CATH LAB;  Service: Cardiovascular;  Laterality: N/A;  . PROSTATECTOMY     for ca  . TONSILLECTOMY     as child  . TOTAL KNEE ARTHROPLASTY  04/22/2011   Procedure: TOTAL KNEE ARTHROPLASTY;  Surgeon: Garald Balding, MD;  Location: [place];  Service: Orthopedics;  Laterality: Right;   Social History   Occupational History  . Not on file  Tobacco Use  . Smoking status: Former Smoker    Packs/day: 1.50    Years: 30.00    Pack years: 45.00    Types: Cigarettes    Last attempt to quit: 04/11/1983    Years since quitting: 34.6  . Smokeless tobacco: Never Used  Substance and Sexual Activity  . Alcohol use: No    Alcohol/week: 0.0 standard drinks  . Drug use: No  . Sexual activity: Never    Birth control/protection: Condom, None

## 2018-01-27 ENCOUNTER — Telehealth: Payer: Self-pay | Admitting: Interventional Cardiology

## 2018-01-27 NOTE — Telephone Encounter (Signed)
Noted and med list adjusted accordingly  .[name] [name]

## 2018-01-27 NOTE — Telephone Encounter (Signed)
New Message:    Dr Marisue Humble wanted it documented in pt's records that pt no longer takes HCTZ.

## 2018-03-22 ENCOUNTER — Other Ambulatory Visit: Payer: Self-pay | Admitting: Interventional Cardiology

## 2018-03-22 MED ORDER — TELMISARTAN 80 MG PO TABS: 80.0000 mg | ORAL_TABLET | Freq: Every day | ORAL | 2 refills | Status: DC

## 2018-03-22 NOTE — Telephone Encounter (Signed)
Eliquis 5mg  refill request; pt is 78 yr old male, wt-112.5kg, Crea-1.07 on 12/09/2017 via [facility] in [place], last seen by Dr. Tamala Julian on 12/04/2017; will send in refill to requested pharmacy.

## 2018-03-27 ENCOUNTER — Encounter (HOSPITAL_COMMUNITY): Payer: Self-pay

## 2018-03-27 ENCOUNTER — Emergency Department (HOSPITAL_COMMUNITY)
Admission: EM | Admit: 2018-03-27 | Discharge: 2018-03-27 | Disposition: A | Discharge status: Home / Self Care | Payer: Medicare Other | Attending: Emergency Medicine | Admitting: Emergency Medicine

## 2018-03-27 ENCOUNTER — Emergency Department (HOSPITAL_COMMUNITY): Payer: Medicare Other

## 2018-03-27 ENCOUNTER — Other Ambulatory Visit: Payer: Self-pay

## 2018-03-27 DIAGNOSIS — Z96653 Presence of artificial knee joint, bilateral: Secondary | ICD-10-CM | POA: Insufficient documentation

## 2018-03-27 DIAGNOSIS — R05 Cough: Secondary | ICD-10-CM | POA: Insufficient documentation

## 2018-03-27 DIAGNOSIS — Z8546 Personal history of malignant neoplasm of prostate: Secondary | ICD-10-CM | POA: Diagnosis not present

## 2018-03-27 DIAGNOSIS — I129 Hypertensive chronic kidney disease with stage 1 through stage 4 chronic kidney disease, or unspecified chronic kidney disease: Secondary | ICD-10-CM | POA: Diagnosis not present

## 2018-03-27 DIAGNOSIS — N183 Chronic kidney disease, stage 3 (moderate): Secondary | ICD-10-CM | POA: Diagnosis not present

## 2018-03-27 DIAGNOSIS — Z87891 Personal history of nicotine dependence: Secondary | ICD-10-CM | POA: Insufficient documentation

## 2018-03-27 DIAGNOSIS — J45909 Unspecified asthma, uncomplicated: Secondary | ICD-10-CM | POA: Diagnosis not present

## 2018-03-27 DIAGNOSIS — Z85820 Personal history of malignant melanoma of skin: Secondary | ICD-10-CM | POA: Insufficient documentation

## 2018-03-27 DIAGNOSIS — R079 Chest pain, unspecified: Secondary | ICD-10-CM

## 2018-03-27 DIAGNOSIS — Z79899 Other long term (current) drug therapy: Secondary | ICD-10-CM | POA: Insufficient documentation

## 2018-03-27 LAB — CBC
HCT: 44 % (ref 39.0–52.0)
Hemoglobin: 14.5 g/dL (ref 13.0–17.0)
MCH: 30.7 pg (ref 26.0–34.0)
MCHC: 33 g/dL (ref 30.0–36.0)
MCV: 93 fL (ref 80.0–100.0)
PLATELETS: 176 10*3/uL (ref 150–400)
RBC: 4.73 MIL/uL (ref 4.22–5.81)
RDW: 12.8 % (ref 11.5–15.5)
WBC: 10.7 10*3/uL — ABNORMAL HIGH (ref 4.0–10.5)
nRBC: 0 % (ref 0.0–0.2)

## 2018-03-27 LAB — BASIC METABOLIC PANEL
Anion gap: 9 (ref 5–15)
BUN: 18 mg/dL (ref 8–23)
CALCIUM: 8.8 mg/dL — AB (ref 8.9–10.3)
CO2: 29 mmol/L (ref 22–32)
CREATININE: 1.26 mg/dL — AB (ref 0.61–1.24)
Chloride: 102 mmol/L (ref 98–111)
GFR calc Af Amer: >60 mL/min (ref >60)
GFR calc non Af Amer: 54 mL/min — ABNORMAL LOW (ref >60)
Glucose, Bld: 102 mg/dL — ABNORMAL HIGH (ref 70–99)
Potassium: 4 mmol/L (ref 3.5–5.1)
Sodium: 140 mmol/L (ref 135–145)

## 2018-03-27 LAB — D-DIMER, QUANTITATIVE: D-Dimer, Quant: <0.27 ug/mL-FEU (ref 0.00–0.50)

## 2018-03-27 LAB — POCT I-STAT TROPONIN I
Troponin i, poc: 0.01 ng/mL (ref 0.00–0.08)
Troponin i, poc: 0 ng/mL (ref 0.00–0.08)

## 2018-03-27 MED ORDER — HYDROCODONE-ACETAMINOPHEN 5-325 MG PO TABS
1.0000 | ORAL_TABLET | ORAL | Status: AC
  Administered 2018-03-27: 1 via ORAL
  Filled 2018-03-27: qty 1

## 2018-03-27 MED ORDER — HYDROCODONE-ACETAMINOPHEN 5-325 MG PO TABS: 1.0000 | ORAL_TABLET | Freq: Four times a day (QID) | ORAL | 0 refills | Status: DC | PRN

## 2018-03-27 MED ORDER — SODIUM CHLORIDE 0.9% FLUSH: 3.0000 mL | Freq: Once | INTRAVENOUS | Status: DC

## 2018-03-27 NOTE — ED Provider Notes (Signed)
[facility] DEPT Provider Note   CSN: [id number] Arrival date & time: 03/27/18  1756     History   Chief Complaint No chief complaint on file.   HPI [name] [name] is a 78 y.o. male.  HPI Patient presents emergency room with complaints of chest pain.  Patient states he started noticing pain yesterday.  He is having sharp intense episodes of pain in his chest that lasts just a second or 2.  They are intermittent and nothing in particular seems to bring them on.  Patient recently has been treated for pneumonia.  He has been on antibiotics and his symptoms are actually improving although he still has a cough.  He denies any fevers.  No leg swelling.  No fevers or chills.  No vomiting or diarrhea.  Patient does not have a history of coronary artery disease or pulmonary embolism. Past Medical History:  Diagnosis Date  . Angina    chest pain- cardiac cath. followed in 2010, record available ,  told then that he should f/u /w Dr. Marisue Humble    . Arthritis    R knee, back    . Asthma    uses  singulair  . Cancer [facility])    prostate, melanoma- 2010, excision    . Chronic kidney disease    prostate cancer - surg. removal- 1996  . Colon polyps   . Diverticulitis   . Dysrhythmia    palpitations, followed by Dr.Ehinger, seen in prep for surgery on 03/27/2011   . GERD (gastroesophageal reflux disease)   . Hepatitis    jaundice- many yrs. ago  . Hiatal hernia   . Hypertension   . Melanoma ([place])    Face  . OSA (obstructive sleep apnea) 04/11/2015   Mild to moderate OSA with AHI 13.3/hr overall and AHI 36.8/hr during REM sleep.  Oxygen saturations were as low as 86% during respiratory events.  . Pneumonia    hosp. 20 yrs. ago  . Prostate cancer ([place]) 1995  . Sleep apnea    study done, 10 yrs. ago, told that he needed  CPAP but  never used     Patient Active Problem List   Diagnosis Date Noted  . Constipation   . Lobar pneumonia ([place]) 02/27/2017  . COPD  with acute exacerbation ([place]) 02/27/2017  . GERD (gastroesophageal reflux disease) 02/27/2017  . CKD (chronic kidney disease), stage III ([place]) 02/27/2017  . Acute and chronic respiratory failure with hypoxia ([place]) 02/27/2017  . Anxiety 10/30/2015  . Dyspnea 08/20/2015  . Solitary pulmonary nodule 05/18/2015  . OSA (obstructive sleep apnea) 04/11/2015  . Chronic anticoagulation 03/17/2015  . Asthma, chronic obstructive, with acute exacerbation ([place]) 02/26/2015  . PAF (paroxysmal atrial fibrillation) ([place]) 01/26/2015  . Postoperative anemia due to acute blood loss 04/24/2011  . Osteoarthritis of knee 04/23/2011  . Hypertension 04/23/2011  . History of prostate cancer 04/23/2011  . Melanoma ([place]) 04/23/2011  . Hiatal hernia 04/23/2011  . Asthma 04/23/2011  . Obesity, Class II, BMI 35-39.9, with comorbidity ([place]) 04/23/2011    Past Surgical History:  Procedure Laterality Date  . CARDIAC CATHETERIZATION     2010  . CARDIAC CATHETERIZATION N/A 10/22/2015   Procedure: Left Heart Cath and Coronary Angiography;  Surgeon: Nelva Bush, MD;  Location: [facility];  Service: Cardiovascular;  Laterality: N/A;  . FRACTURE SURGERY     L wrist, hardware- 1982  . JOINT REPLACEMENT     L knee, 2009  . LEFT  HEART CATHETERIZATION WITH CORONARY ANGIOGRAM N/A 09/08/2011   Procedure: LEFT HEART CATHETERIZATION WITH CORONARY ANGIOGRAM;  Surgeon: Sinclair Grooms, MD;  Location: [facility] CATH LAB;  Service: Cardiovascular;  Laterality: N/A;  . PROSTATECTOMY     for ca  . TONSILLECTOMY     as child  . TOTAL KNEE ARTHROPLASTY  04/22/2011   Procedure: TOTAL KNEE ARTHROPLASTY;  Surgeon: Garald Balding, MD;  Location: [facility];  Service: Orthopedics;  Laterality: Right;        Home Medications    Prior to Admission medications   Medication Sig Start Date End Date Taking? Authorizing Provider  albuterol (PROVENTIL HFA;VENTOLIN HFA) 108 (90 BASE) MCG/ACT inhaler Inhale 2 puffs into the lungs every 6  (six) hours as needed for wheezing or shortness of breath.   Yes [provider]  budesonide-formoterol (SYMBICORT) 160-4.5 MCG/ACT inhaler Inhale 2 puffs into the lungs 2 (two) times daily. 05/22/17  Yes Juanito Doom, MD  Calcium Carbonate-Vitamin D (CALCIUM + D) 600-200 MG-UNIT TABS Take 1 tablet by mouth 2 (two) times daily.   Yes [provider]  ELIQUIS 5 MG TABS tablet TAKE 1 TABLET BY MOUTH TWO  TIMES DAILY Patient taking differently: Take 5 mg by mouth 2 (two) times daily.  03/22/18  Yes Belva Crome, MD  flecainide (TAMBOCOR) 50 MG tablet Take 1 tablet (50 mg total) by mouth 2 (two) times daily. Please make yearly appt with Dr. Tamala Julian for August for future refills. 1st attempt 06/12/17  Yes Belva Crome, MD  montelukast (SINGULAIR) 10 MG tablet Take 10 mg by mouth daily before breakfast.    Yes [provider]  Multiple Vitamins tablet Take 1 tablet by mouth daily.   Yes [provider]  nitroGLYCERIN (NITROSTAT) 0.4 MG SL tablet Place 1 tablet (0.4 mg total) under the tongue every 5 (five) minutes x 3 doses as needed for chest pain. 03/18/15  Yes Kilroy, Luke K, PA-C  Omega-3 Fatty Acids (FISH OIL) 1000 MG CAPS Take 1,000 mg by mouth 2 (two) times daily.   Yes [provider]  omeprazole (PRILOSEC) 40 MG capsule Take 40 mg by mouth at bedtime.    Yes [provider]  telmisartan (MICARDIS) 80 MG tablet Take 1 tablet (80 mg total) by mouth daily. 03/22/18  Yes Belva Crome, MD  HYDROcodone-acetaminophen (NORCO/VICODIN) 5-325 MG tablet Take 1 tablet by mouth every 6 (six) hours as needed for moderate pain. 03/27/18   Dorie Rank, MD    Family History Family History  Problem Relation Age of Onset  . Crohn's disease Brother   . Breast cancer Mother   . Alzheimer's disease Father   . Heart disease Brother   . Hypertension Brother   . Anesthesia problems Neg Hx   . Hypotension Neg Hx   . Malignant hyperthermia Neg Hx   . Pseudochol  deficiency Neg Hx     Social History Social History   Tobacco Use  . Smoking status: Former Smoker    Packs/day: 1.50    Years: 30.00    Pack years: 45.00    Types: Cigarettes    Last attempt to quit: 04/11/1983    Years since quitting: 34.9  . Smokeless tobacco: Never Used  Substance Use Topics  . Alcohol use: No    Alcohol/week: 0.0 standard drinks  . Drug use: No     Allergies   Aminophylline; Carvedilol; Lisinopril; Tenormin [atenolol]; and Zolpidem tartrate   Review of Systems Review  of Systems  All other systems reviewed and are negative.    Physical Exam Updated Vital Signs BP 134/69 (BP Location: Left Arm) Comment: Simultaneous filing. User may not have seen previous data.  Pulse (!) 59 Comment: Simultaneous filing. User may not have seen previous data.  Temp 98 F (36.7 C) (Oral)   Resp 13 Comment: Simultaneous filing. User may not have seen previous data.  SpO2 95% Comment: Simultaneous filing. User may not have seen previous data.  Physical Exam Vitals signs and nursing note reviewed.  Constitutional:      General: He is not in acute distress.    Appearance: He is well-developed.  HENT:     Head: Normocephalic and atraumatic.     Right Ear: External ear normal.     Left Ear: External ear normal.  Eyes:     General: No scleral icterus.       Right eye: No discharge.        Left eye: No discharge.     Conjunctiva/sclera: Conjunctivae normal.  Neck:     Musculoskeletal: Neck supple.     Trachea: No tracheal deviation.  Cardiovascular:     Rate and Rhythm: Normal rate and regular rhythm.  Pulmonary:     Effort: Pulmonary effort is normal. No respiratory distress.     Breath sounds: Normal breath sounds. No stridor. No wheezing or rales.  Abdominal:     General: Bowel sounds are normal. There is no distension.     Palpations: Abdomen is soft.     Tenderness: There is no abdominal tenderness. There is no guarding or rebound.  Musculoskeletal:         General: No tenderness.  Skin:    General: Skin is warm and dry.     Findings: No rash.  Neurological:     Mental Status: He is alert.     Cranial Nerves: No cranial nerve deficit (no facial droop, extraocular movements intact, no slurred speech).     Sensory: No sensory deficit.     Motor: No abnormal muscle tone or seizure activity.     Coordination: Coordination normal.      ED Treatments / Results  Labs (all labs ordered are listed, but only abnormal results are displayed) Labs Reviewed  BASIC METABOLIC PANEL - Abnormal; Notable for the following components:      Result Value   Glucose, Bld 102 (*)    Creatinine, Ser 1.26 (*)    Calcium 8.8 (*)    GFR calc non Af Amer 54 (*)    All other components within normal limits  CBC - Abnormal; Notable for the following components:   WBC 10.7 (*)    All other components within normal limits  D-DIMER, QUANTITATIVE (NOT AT [facility])  I-STAT TROPONIN, ED  POCT I-STAT TROPONIN I  POCT I-STAT TROPONIN I  I-STAT TROPONIN, ED    EKG EKG Interpretation  Date/Time:  Saturday March 27 2018 18:01:55 EST Ventricular Rate:  73 PR Interval:    QRS Duration: 101 QT Interval:  377 QTC Calculation: 416 R Axis:   -28 Text Interpretation:  Sinus rhythm Borderline left axis deviation Baseline wander in lead(s) V3 No significant change since last tracing Confirmed by Dorie Rank [phone]) on 03/27/2018 6:17:48 PM   Radiology Dg Chest 2 View  Result Date: 03/27/2018 CLINICAL DATA:  78 year old male with chest pain since yesterday. EXAM: CHEST - 2 VIEW COMPARISON:  05/22/2017 and earlier. FINDINGS: Lung volumes and mediastinal contours remain within normal  limits. Stable mild streaky lower lobe opacity on the lateral view which most resembles atelectasis or scarring. Visualized tracheal air column is within normal limits. No pneumothorax, pulmonary edema, pleural effusion or acute pulmonary opacity. No acute osseous abnormality identified.  Negative visible bowel gas pattern. IMPRESSION: No acute cardiopulmonary abnormality. Mild chronic lower lobe atelectasis or scarring. Electronically Signed   By: Genevie Ann M.D.   On: 03/27/2018 18:50    Procedures Procedures (including critical care time)  Medications Ordered in ED Medications  sodium chloride flush (NS) 0.9 % injection 3 mL (0 mLs Intravenous Hold 03/27/18 1816)  HYDROcodone-acetaminophen (NORCO/VICODIN) 5-325 MG per tablet 1 tablet (1 tablet Oral Given 03/27/18 2128)     Initial Impression / Assessment and Plan / ED Course  I have reviewed the triage vital signs and the nursing notes.  Pertinent labs & imaging results that were available during my care of the patient were reviewed by me and considered in my medical decision making (see chart for details).  Clinical Course as of Mar 27 2248  Sat Mar 27, 2018  2248 Outside records reviewed.  Patient did have a CT scan of his chest abdomen pelvis on January 15 of this year at [facility] Dorie Rank, MD   Patient presented to the emergency room for evaluation of chest pain.  Symptoms were sharp and atypical for acute coronary syndrome.  All cardiac enzymes are negative.  Patient is low risk for PE and his d-dimer is negative.  I doubt pulmonary embolism.  Symptoms may be related to his recent cough and congestion.  Discharged home with pain medications.  Patient is stable for outpatient follow-up.  Final Clinical Impressions(s) / ED Diagnoses   Final diagnoses:  Chest pain, unspecified type    ED Discharge Orders         Ordered    HYDROcodone-acetaminophen (NORCO/VICODIN) 5-325 MG tablet  Every 6 hours PRN     03/27/18 2247           Dorie Rank, MD 03/27/18 2250

## 2018-03-27 NOTE — Discharge Instructions (Addendum)
Take the medications as needed for pain, follow-up with your doctor, monitor for fever or shortness of breath, worsening symptoms

## 2018-03-27 NOTE — ED Triage Notes (Signed)
Pt reports episodes of chest pain since yesterday but when he woke up today the pain was much worse. Pain is still intermittent. Pt reports hx of Afib but states that this does not feel like Afib. Pt reports some SHOB but states that he just stopped taking antibiotics for pneumonia which he was diagnosed with last week.

## 2018-04-29 ENCOUNTER — Other Ambulatory Visit: Payer: Self-pay | Admitting: Interventional Cardiology

## 2018-05-25 ENCOUNTER — Encounter (INDEPENDENT_AMBULATORY_CARE_PROVIDER_SITE_OTHER): Payer: Self-pay | Admitting: Orthopaedic Surgery

## 2018-05-25 ENCOUNTER — Ambulatory Visit (INDEPENDENT_AMBULATORY_CARE_PROVIDER_SITE_OTHER): Payer: Medicare Other

## 2018-05-25 ENCOUNTER — Ambulatory Visit (INDEPENDENT_AMBULATORY_CARE_PROVIDER_SITE_OTHER): Payer: Medicare Other | Admitting: Orthopaedic Surgery

## 2018-05-25 ENCOUNTER — Other Ambulatory Visit: Payer: Self-pay

## 2018-05-25 VITALS — BP 170/80 | HR 81 | Resp 16 | Ht 67.0 in | Wt 246.0 lb

## 2018-05-25 DIAGNOSIS — Z7901 Long term (current) use of anticoagulants: Secondary | ICD-10-CM

## 2018-05-25 DIAGNOSIS — M25512 Pain in left shoulder: Secondary | ICD-10-CM | POA: Diagnosis not present

## 2018-05-25 DIAGNOSIS — W01190A Fall on same level from slipping, tripping and stumbling with subsequent striking against furniture, initial encounter: Secondary | ICD-10-CM | POA: Diagnosis not present

## 2018-05-25 DIAGNOSIS — G8929 Other chronic pain: Secondary | ICD-10-CM | POA: Diagnosis not present

## 2018-05-25 NOTE — Progress Notes (Signed)
Office Visit Note   Patient: [name] [name]           Date of Birth: [birth year]           MRN: [id number] Visit Date: 05/25/2018              Requested by: Gaynelle Arabian, MD [address] [place], [place] [address] PCP: Gaynelle Arabian, MD   Assessment & Plan: Visit Diagnoses:  1. Chronic left shoulder pain   2. Acute pain of left shoulder     Plan: Acute onset of left shoulder pain after a fall.  Presently minimally symptomatic.  Possibilities include a rotator cuff tear as he had similar problem on the right side requiring surgical intervention.  Long discussion regarding possible diagnosis.  As Mr. Liebman is relatively asymptomatic at present would suggest waiting 3 to 4 weeks and follow-up if necessary.  Would consider an MRI scan given history of rotator cuff tear on the opposite side  Follow-Up Instructions: Return if symptoms worsen or fail to improve.   Orders:  Orders Placed This Encounter  Procedures   XR Shoulder Left   No orders of the defined types were placed in this encounter.     Procedures: No procedures performed   Clinical Data: No additional findings.   Subjective: Chief Complaint  Patient presents with   Left Shoulder - Pain  [name] [name] states he fell about two weeks ago and hit his shoulder on either the television stand or the floor, he is unsure. Patient states he had bruising and swelling along with the pain.  [name] [name] is a history of atrial fibrillation and is on a blood thinner.  Did have considerable bruising but notes that that has mostly resolved.  He was having some pain with overhead activity and sleeping but over the last several days is not having any significant pain or compromise of his activities with the above or below eye level.  Denies any numbness or tingling into either upper extremity  HPI  Review of Systems  Constitutional: Negative for chills and fatigue.  HENT: Negative for sinus pain.   Eyes:  Negative for pain.  Respiratory: Negative for shortness of breath and wheezing.   Cardiovascular: Negative for palpitations.  Gastrointestinal: Negative for abdominal pain, constipation and diarrhea.  Endocrine: Negative for polyuria.  Genitourinary: Negative for urgency.  Musculoskeletal: Negative for gait problem and joint swelling.  Skin: Negative for rash.  Allergic/Immunologic: Negative for immunocompromised state.  Neurological: Negative for dizziness, weakness and headaches.  Hematological: Does not bruise/bleed easily.  Psychiatric/Behavioral: Negative for confusion. The patient is not nervous/anxious.      Objective: Vital Signs: BP (!) 170/80 (BP Location: Left Arm, Patient Position: Sitting, Cuff Size: Normal)    Pulse 81    Resp 16    Ht 5\' 7"  (1.702 m)    Wt 246 lb (111.6 kg)    BMI 38.53 kg/m   Physical Exam Constitutional:      Appearance: He is well-developed.  Eyes:     Pupils: Pupils are equal, round, and reactive to light.  Pulmonary:     Effort: Pulmonary effort is normal.  Skin:    General: Skin is warm and dry.  Neurological:     Mental Status: He is alert and oriented to person, place, and time.  Psychiatric:        Behavior: Behavior normal.     Ortho Exam awake alert and oriented x3.  Comfortable sitting.  Able  to place left arm fully overhead without a problem.  Small area of ecchymosis along the deltoid muscle laterally without pain.  Minimally positive empty can testing.  Biceps appears to be intact.  No grinding or grating.  No pain about the acromioclavicular joint.  Good grip and good release.  Specialty Comments:  No specialty comments available.  Imaging: Xr Shoulder Left  Result Date: 05/25/2018 Films of the left shoulder obtained in several projections.  There are degenerative changes at the acromioclavicular joint with osteophytes on both sides of the joint directed inferiorly.  Normal space between the humeral head and the acromion.  No  ectopic calcification.  Humeral head is centered about the glenoid. no evidence of fracture    PMFS History: Patient Active Problem List   Diagnosis Date Noted   Constipation    Lobar pneumonia ([place]) 02/27/2017   COPD with acute exacerbation ([place]) 02/27/2017   GERD (gastroesophageal reflux disease) 02/27/2017   CKD (chronic kidney disease), stage III ([place]) 02/27/2017   Acute and chronic respiratory failure with hypoxia ([place]) 02/27/2017   Anxiety 10/30/2015   Dyspnea 08/20/2015   Solitary pulmonary nodule 05/18/2015   OSA (obstructive sleep apnea) 04/11/2015   Chronic anticoagulation 03/17/2015   Asthma, chronic obstructive, with acute exacerbation ([place]) 02/26/2015   PAF (paroxysmal atrial fibrillation) ([place]) 01/26/2015   Postoperative anemia due to acute blood loss 04/24/2011   Osteoarthritis of knee 04/23/2011   Hypertension 04/23/2011   History of prostate cancer 04/23/2011   Melanoma ([place]) 04/23/2011   Hiatal hernia 04/23/2011   Asthma 04/23/2011   Obesity, Class II, BMI 35-39.9, with comorbidity ([place]) 04/23/2011   Past Medical History:  Diagnosis Date   Angina    chest pain- cardiac cath. followed in 2010, record available ,  told then that he should f/u /w Dr. Marisue Humble     Arthritis    R knee, back     Asthma    uses  singulair   Cancer [facility])    prostate, melanoma- 2010, excision     Chronic kidney disease    prostate cancer - surg. removal- 1996   Colon polyps    Diverticulitis    Dysrhythmia    palpitations, followed by Dr.Ehinger, seen in prep for surgery on 03/27/2011    GERD (gastroesophageal reflux disease)    Hepatitis    jaundice- many yrs. ago   Hiatal hernia    Hypertension    Melanoma ([place])    Face   OSA (obstructive sleep apnea) 04/11/2015   Mild to moderate OSA with AHI 13.3/hr overall and AHI 36.8/hr during REM sleep.  Oxygen saturations were as low as 86% during respiratory events.   Pneumonia    hosp. 20 yrs.  ago   Prostate cancer ([place]) 1995   Sleep apnea    study done, 10 yrs. ago, told that he needed  CPAP but  never used     Family History  Problem Relation Age of Onset   Crohn's disease Brother    Breast cancer Mother    Alzheimer's disease Father    Heart disease Brother    Hypertension Brother    Anesthesia problems Neg Hx    Hypotension Neg Hx    Malignant hyperthermia Neg Hx    Pseudochol deficiency Neg Hx     Past Surgical History:  Procedure Laterality Date   CARDIAC CATHETERIZATION     2010   CARDIAC CATHETERIZATION N/A 10/22/2015   Procedure: Left Heart Cath and Coronary Angiography;  Surgeon:  Nelva Bush, MD;  Location: [place] [facility] LAB;  Service: Cardiovascular;  Laterality: N/A;   FRACTURE SURGERY     L wrist, hardware- 1982   JOINT REPLACEMENT     L knee, 2009   LEFT HEART CATHETERIZATION WITH CORONARY ANGIOGRAM N/A 09/08/2011   Procedure: LEFT HEART CATHETERIZATION WITH CORONARY ANGIOGRAM;  Surgeon: Sinclair Grooms, MD;  Location: [facility] CATH LAB;  Service: Cardiovascular;  Laterality: N/A;   PROSTATECTOMY     for ca   TONSILLECTOMY     as child   TOTAL KNEE ARTHROPLASTY  04/22/2011   Procedure: TOTAL KNEE ARTHROPLASTY;  Surgeon: Garald Balding, MD;  Location: [facility];  Service: Orthopedics;  Laterality: Right;   Social History   Occupational History   Not on file  Tobacco Use   Smoking status: Former Smoker    Packs/day: 1.50    Years: 30.00    Pack years: 45.00    Types: Cigarettes    Last attempt to quit: 04/11/1983    Years since quitting: 35.1   Smokeless tobacco: Never Used  Substance and Sexual Activity   Alcohol use: No    Alcohol/week: 0.0 standard drinks   Drug use: No   Sexual activity: Never    Birth control/protection: Condom, None

## 2018-06-24 ENCOUNTER — Other Ambulatory Visit: Payer: Self-pay

## 2018-06-24 ENCOUNTER — Ambulatory Visit (INDEPENDENT_AMBULATORY_CARE_PROVIDER_SITE_OTHER): Payer: Medicare Other | Admitting: Orthopaedic Surgery

## 2018-06-24 ENCOUNTER — Encounter (INDEPENDENT_AMBULATORY_CARE_PROVIDER_SITE_OTHER): Payer: Self-pay | Admitting: Orthopaedic Surgery

## 2018-06-24 VITALS — BP 156/68 | HR 63 | Ht 67.0 in | Wt 245.0 lb

## 2018-06-24 DIAGNOSIS — M722 Plantar fascial fibromatosis: Secondary | ICD-10-CM | POA: Diagnosis not present

## 2018-06-24 DIAGNOSIS — M7732 Calcaneal spur, left foot: Secondary | ICD-10-CM | POA: Diagnosis not present

## 2018-06-24 MED ORDER — LIDOCAINE HCL (PF) 1 % IJ SOLN
0.3000 mL | INTRAMUSCULAR | Status: AC | PRN
  Administered 2018-06-24: 13:00:00 .3 mL

## 2018-06-24 MED ORDER — BUPIVACAINE HCL 0.25 % IJ SOLN
0.3300 mL | INTRAMUSCULAR | Status: AC | PRN
  Administered 2018-06-24: 13:00:00 .33 mL

## 2018-06-24 MED ORDER — METHYLPREDNISOLONE ACETATE 40 MG/ML IJ SUSP
13.3300 mg | INTRAMUSCULAR | Status: AC | PRN
  Administered 2018-06-24: 13.33 mg

## 2018-06-24 NOTE — Progress Notes (Signed)
Office Visit Note   Patient: [name] [name]           Date of Birth: December 14, 1940           MRN: [id number] Visit Date: 06/24/2018              Requested by: Gaynelle Arabian, MD [address] [place], [place] [address] PCP: Gaynelle Arabian, MD   Assessment & Plan: Visit Diagnoses:  1. Plantar fasciitis of left foot   2. Heel spur, left      Plan: #1: Steroid injection to the plantar cannula area. #2: Arch supports were given  Follow-Up Instructions: Return if symptoms worsen or fail to improve.   Orders:  No orders of the defined types were placed in this encounter.  No orders of the defined types were placed in this encounter.     Procedures: Foot Inj Date/Time: 06/24/2018 12:44 PM Performed by: Cherylann Ratel, PA-C Authorized by: Cherylann Ratel, PA-C   Consent Given by:  Patient Site marked: the procedure site was marked   Timeout: prior to procedure the correct patient, procedure, and site was verified   Indications:  Fasciitis Condition: Plantar Fasciitis   Location: left plantar fascia muscle   Prep: patient was prepped and draped in usual sterile fashion   Needle Size:  25 G Approach: Plantar. Medications:  0.33 mL bupivacaine 0.25 %; 13.33 mg methylPREDNISolone acetate 40 MG/ML; 0.3 mL lidocaine (PF) 1 % Patient Tolerance:  Patient tolerated the procedure well with no immediate complications     Clinical Data: No additional findings.   Subjective: Chief Complaint  Patient presents with  . Left Foot - Pain  HPI: Patient presents today for his left heel. He states that it has been hurting for one month. The pain is located on the bottom of his heel. It is worse upon getting up in the morning or after standing for a prolonged period. He is taking tylenol for pain. He had x-rays of his ankle and foot in October of 2019.    Review of Systems  Constitutional: Positive for fatigue.  HENT: Negative for ear pain.   Eyes: Negative  for pain.  Respiratory: Negative for shortness of breath.   Cardiovascular: Positive for leg swelling.  Gastrointestinal: Negative for constipation and diarrhea.  Endocrine: Negative for cold intolerance and heat intolerance.  Genitourinary: Negative for difficulty urinating.  Musculoskeletal: Negative for joint swelling.  Skin: Negative for rash.  Allergic/Immunologic: Negative for food allergies.  Neurological: Negative for weakness.  Hematological: Does not bruise/bleed easily.  Psychiatric/Behavioral: Negative for sleep disturbance.     Objective: Vital Signs: BP (!) 156/68   Pulse 63   Ht 5\' 7"  (1.702 m)   Wt 245 lb (111.1 kg)   BMI 38.37 kg/m   Physical Exam Constitutional:      Appearance: He is well-developed. He is obese.  HENT:     Head: Normocephalic.  Eyes:     Pupils: Pupils are equal, round, and reactive to light.  Pulmonary:     Effort: Pulmonary effort is normal.  Skin:    General: Skin is warm and dry.  Neurological:     Mental Status: He is alert and oriented to person, place, and time.  Psychiatric:        Behavior: Behavior normal.     Ortho Exam  Exam today reveals tenderness to palpation over the plantar surface of the cane he is in its midportion.  With the knee  in full extension he barely gets to neutral at the ankle.  No ecchymosis or erythema is noted.  Neurovascular intact  Specialty Comments:  No specialty comments available.  Imaging: No results found.  Previous x-rays do reveal large plantar calcaneal spur dated 12/11/2017.   PMFS History: Current Outpatient Medications  Medication Sig Dispense Refill  . albuterol (PROVENTIL HFA;VENTOLIN HFA) 108 (90 BASE) MCG/ACT inhaler Inhale 2 puffs into the lungs every 6 (six) hours as needed for wheezing or shortness of breath.    . budesonide-formoterol (SYMBICORT) 160-4.5 MCG/ACT inhaler Inhale 2 puffs into the lungs 2 (two) times daily. 2 Inhaler 0  . Calcium Carbonate-Vitamin D (CALCIUM +  D) 600-200 MG-UNIT TABS Take 1 tablet by mouth 2 (two) times daily.    Marland Kitchen ELIQUIS 5 MG TABS tablet TAKE 1 TABLET BY MOUTH TWO  TIMES DAILY (Patient taking differently: Take 5 mg by mouth 2 (two) times daily. ) 180 tablet 2  . flecainide (TAMBOCOR) 50 MG tablet TAKE 1 TABLET BY MOUTH 2  TIMES DAILY. 180 tablet 1  . HYDROcodone-acetaminophen (NORCO/VICODIN) 5-325 MG tablet Take 1 tablet by mouth every 6 (six) hours as needed for moderate pain. 12 tablet 0  . montelukast (SINGULAIR) 10 MG tablet Take 10 mg by mouth daily before breakfast.     . Multiple Vitamins tablet Take 1 tablet by mouth daily.    . nitroGLYCERIN (NITROSTAT) 0.4 MG SL tablet Place 1 tablet (0.4 mg total) under the tongue every 5 (five) minutes x 3 doses as needed for chest pain. 25 tablet 3  . Omega-3 Fatty Acids (FISH OIL) 1000 MG CAPS Take 1,000 mg by mouth 2 (two) times daily.    Marland Kitchen omeprazole (PRILOSEC) 40 MG capsule Take 40 mg by mouth at bedtime.     Marland Kitchen telmisartan (MICARDIS) 80 MG tablet Take 1 tablet (80 mg total) by mouth daily. 90 tablet 2   No current facility-administered medications for this visit.     Patient Active Problem List   Diagnosis Date Noted  . Osteoarthritis of knee 04/23/2011    Priority: High  . Obesity, Class II, BMI 35-39.9, with comorbidity ([facility]) 04/23/2011    Priority: Medium  . Constipation   . Lobar pneumonia ([facility]) 02/27/2017  . COPD with acute exacerbation ([facility]) 02/27/2017  . GERD (gastroesophageal reflux disease) 02/27/2017  . CKD (chronic kidney disease), stage III ([facility]) 02/27/2017  . Acute and chronic respiratory failure with hypoxia ([facility]) 02/27/2017  . Anxiety 10/30/2015  . Dyspnea 08/20/2015  . Solitary pulmonary nodule 05/18/2015  . OSA (obstructive sleep apnea) 04/11/2015  . Chronic anticoagulation 03/17/2015  . Asthma, chronic obstructive, with acute exacerbation ([facility]) 02/26/2015  . PAF (paroxysmal atrial fibrillation) ([facility]) 01/26/2015  . Postoperative anemia due to acute blood  loss 04/24/2011  . Hypertension 04/23/2011  . History of prostate cancer 04/23/2011  . Melanoma ([facility] [place] [facility]) 04/23/2011  . Hiatal hernia 04/23/2011  . Asthma 04/23/2011   Past Medical History:  Diagnosis Date  . Angina    chest pain- cardiac cath. followed in 2010, record available ,  told then that he should f/u /w Dr. Marisue Humble    . Arthritis    R knee, back    . Asthma    uses  singulair  . Cancer [facility])    prostate, melanoma- 2010, excision    . Chronic kidney disease    prostate cancer - surg. removal- 1996  . Colon polyps   . Diverticulitis   . Dysrhythmia    palpitations,  followed by Dr.Ehinger, seen in prep for surgery on 03/27/2011   . GERD (gastroesophageal reflux disease)   . Hepatitis    jaundice- many yrs. ago  . Hiatal hernia   . Hypertension   . Melanoma ([place])    Face  . OSA (obstructive sleep apnea) 04/11/2015   Mild to moderate OSA with AHI 13.3/hr overall and AHI 36.8/hr during REM sleep.  Oxygen saturations were as low as 86% during respiratory events.  . Pneumonia    hosp. 20 yrs. ago  . Prostate cancer ([place]) 1995  . Sleep apnea    study done, 10 yrs. ago, told that he needed  CPAP but  never used     Family History  Problem Relation Age of Onset  . Crohn's disease Brother   . Breast cancer Mother   . Alzheimer's disease Father   . Heart disease Brother   . Hypertension Brother   . Anesthesia problems Neg Hx   . Hypotension Neg Hx   . Malignant hyperthermia Neg Hx   . Pseudochol deficiency Neg Hx     Past Surgical History:  Procedure Laterality Date  . CARDIAC CATHETERIZATION     2010  . CARDIAC CATHETERIZATION N/A 10/22/2015   Procedure: Left Heart Cath and Coronary Angiography;  Surgeon: Nelva Bush, MD;  Location: [facility];  Service: Cardiovascular;  Laterality: N/A;  . FRACTURE SURGERY     L wrist, hardware- 1982  . JOINT REPLACEMENT     L knee, 2009  . LEFT HEART CATHETERIZATION WITH CORONARY ANGIOGRAM N/A 09/08/2011   Procedure:  LEFT HEART CATHETERIZATION WITH CORONARY ANGIOGRAM;  Surgeon: Sinclair Grooms, MD;  Location: [facility] CATH LAB;  Service: Cardiovascular;  Laterality: N/A;  . PROSTATECTOMY     for ca  . TONSILLECTOMY     as child  . TOTAL KNEE ARTHROPLASTY  04/22/2011   Procedure: TOTAL KNEE ARTHROPLASTY;  Surgeon: Garald Balding, MD;  Location: [facility];  Service: Orthopedics;  Laterality: Right;   Social History   Occupational History  . Not on file  Tobacco Use  . Smoking status: Former Smoker    Packs/day: 1.50    Years: 30.00    Pack years: 45.00    Types: Cigarettes    Last attempt to quit: 04/11/1983    Years since quitting: 35.2  . Smokeless tobacco: Never Used  Substance and Sexual Activity  . Alcohol use: No    Alcohol/week: 0.0 standard drinks  . Drug use: No  . Sexual activity: Never    Birth control/protection: Condom, None

## 2018-07-09 ENCOUNTER — Telehealth: Payer: Self-pay | Admitting: Interventional Cardiology

## 2018-07-09 NOTE — Telephone Encounter (Signed)
Per Dr. Tamala Julian, pt needs f/u for BP.  Scheduled for 5/6.     Virtual Visit Pre-Appointment Phone Call  "(Name), I am calling you today to discuss your upcoming appointment. We are currently trying to limit exposure to the virus that causes COVID-19 by seeing patients at home rather than in the office."  1. "What is the BEST phone number to call the day of the visit?" - include this in appointment notes  2. "Do you have or have access to (through a family member/friend) a smartphone with video capability that we can use for your visit?" a. If yes - list this number in appt notes as "cell" (if different from BEST phone #) and list the appointment type as a VIDEO visit in appointment notes b. If no - list the appointment type as a PHONE visit in appointment notes  3. Confirm consent - "In the setting of the current Covid19 crisis, you are scheduled for a (phone or video) visit with your provider on (date) at (time).  Just as we do with many in-office visits, in order for you to participate in this visit, we must obtain consent.  If you'd like, I can send this to your mychart (if signed up) or email for you to review.  Otherwise, I can obtain your verbal consent now.  All virtual visits are billed to your insurance company just like a normal visit would be.  By agreeing to a virtual visit, we'd like you to understand that the technology does not allow for your provider to perform an examination, and thus may limit your provider's ability to fully assess your condition. If your provider identifies any concerns that need to be evaluated in person, we will make arrangements to do so.  Finally, though the technology is pretty good, we cannot assure that it will always work on either your or our end, and in the setting of a video visit, we may have to convert it to a phone-only visit.  In either situation, we cannot ensure that we have a secure connection.  Are you willing to proceed?" STAFF: Did the patient  verbally acknowledge consent to telehealth visit? Document YES/NO here: yes  4. Advise patient to be prepared - "Two hours prior to your appointment, go ahead and check your blood pressure, pulse, oxygen saturation, and your weight (if you have the equipment to check those) and write them all down. When your visit starts, your provider will ask you for this information. If you have an Apple Watch or Kardia device, please plan to have heart rate information ready on the day of your appointment. Please have a pen and paper handy nearby the day of the visit as well."  5. Give patient instructions for MyChart download to smartphone OR [email or web address] as below if video visit (depending on what platform provider is using)  6. Inform patient they will receive a phone call 15 minutes prior to their appointment time (may be from unknown caller ID) so they should be prepared to answer    TELEPHONE CALL NOTE  [name] [name] has been deemed a candidate for a follow-up tele-health visit to limit community exposure during the Covid-19 pandemic. I spoke with the patient via phone to ensure availability of phone/video source, confirm preferred email & phone number, and discuss instructions and expectations.  I reminded [name] [name] to be prepared with any vital sign and/or heart rhythm information that could potentially be obtained via home monitoring, at the time of  his visit. I reminded [name] [name] to expect a phone call prior to his visit.  , L, LPN 04/13/2681 41:96 AM   INSTRUCTIONS FOR DOWNLOADING THE MYCHART APP TO SMARTPHONE  - The patient must first make sure to have activated MyChart and know their login information - If Apple, go to [employer] and type in MyChart in the search bar and download the app. If Android, ask patient to go to [employer] and type in [place] in the search bar and download the app. The app is free but as with any other app downloads, their phone  may require them to verify saved payment information or Apple/Android password.  - The patient will need to then log into the app with their MyChart username and password, and select [facility] as their healthcare provider to link the account. When it is time for your visit, go to the MyChart app, find appointments, and click Begin Video Visit. Be sure to Select Allow for your device to access the Microphone and Camera for your visit. You will then be connected, and your provider will be with you shortly.  **If they have any issues connecting, or need assistance please contact MyChart service desk [phone])**  **If using a computer, in order to ensure the best quality for their visit they will need to use either of the following Internet Browsers: [employer], or Google Chrome**  IF USING DOXIMITY or DOXY.ME - The patient will receive a link just prior to their visit by text.     FULL LENGTH CONSENT FOR TELE-HEALTH VISIT   I hereby voluntarily request, consent and authorize [facility] and its employed or contracted physicians, physician assistants, nurse practitioners or other licensed health care professionals (the Practitioner), to provide me with telemedicine health care services (the "Services") as deemed necessary by the treating Practitioner. I acknowledge and consent to receive the Services by the Practitioner via telemedicine. I understand that the telemedicine visit will involve communicating with the Practitioner through live audiovisual communication technology and the disclosure of certain medical information by electronic transmission. I acknowledge that I have been given the opportunity to request an in-person assessment or other available alternative prior to the telemedicine visit and am voluntarily participating in the telemedicine visit.  I understand that I have the right to withhold or withdraw my consent to the use of telemedicine in the course of my  care at any time, without affecting my right to future care or treatment, and that the Practitioner or I may terminate the telemedicine visit at any time. I understand that I have the right to inspect all information obtained and/or recorded in the course of the telemedicine visit and may receive copies of available information for a reasonable fee.  I understand that some of the potential risks of receiving the Services via telemedicine include:  Marland Kitchen Delay or interruption in medical evaluation due to technological equipment failure or disruption; . Information transmitted may not be sufficient (e.g. poor resolution of images) to allow for appropriate medical decision making by the Practitioner; and/or  . In rare instances, security protocols could fail, causing a breach of personal health information.  Furthermore, I acknowledge that it is my responsibility to provide information about my medical history, conditions and care that is complete and accurate to the best of my ability. I acknowledge that Practitioner's advice, recommendations, and/or decision may be based on factors not within their control, such as incomplete or inaccurate data provided by me  or distortions of diagnostic images or specimens that may result from electronic transmissions. I understand that the practice of medicine is not an exact science and that Practitioner makes no warranties or guarantees regarding treatment outcomes. I acknowledge that I will receive a copy of this consent concurrently upon execution via email to the email address I last provided but may also request a printed copy by calling the office of [facility].    I understand that my insurance will be billed for this visit.   I have read or had this consent read to me. . I understand the contents of this consent, which adequately explains the benefits and risks of the Services being provided via telemedicine.  . I have been provided ample opportunity to ask  questions regarding this consent and the Services and have had my questions answered to my satisfaction. . I give my informed consent for the services to be provided through the use of telemedicine in my medical care  By participating in this telemedicine visit I agree to the above.

## 2018-07-13 NOTE — Progress Notes (Signed)
Virtual Visit via Video Note   This visit type was conducted due to national recommendations for restrictions regarding the COVID-19 Pandemic (e.g. social distancing) in an effort to limit this patient's exposure and mitigate transmission in our community.  Due to his co-morbid illnesses, this patient is at least at moderate risk for complications without adequate follow up.  This format is felt to be most appropriate for this patient at this time.  All issues noted in this document were discussed and addressed.  A limited physical exam was performed with this format.  Please refer to the patient's chart for his consent to telehealth for [facility].   Date:  07/14/2018   ID:  [name] [name], [name] [name] Jul 12, 1940, MRN [id number]  Patient Location: Home Provider Location: Office  PCP:  Gaynelle Arabian, MD  Cardiologist:  No primary care provider on file.  Electrophysiologist:  None   Evaluation Performed:  Follow-Up Visit  Chief Complaint:  AF/hypertension  History of Present Illness:    [name] [name] is a 78 y.o. male with paroxysmal atrial fibrillation, recurring chest discomfort, hypertension, and chronic kidney disease.   He feels well.  Last evening he had a sudden onset of dizziness.  Did not have palpitations or chest pain.  He measured the blood pressure and it was 160/80.  He became concerned about the blood pressure thinking it was the source of his complaint.  He take an extra half tablet of telmisartan.  He started feeling better.  He measured multiple blood pressures in the same arm and they got progressively lower.  He feels fine this morning.  In January he was seen in the emergency room with chest pain.  He ruled out for myocardial infarction by point-of-care markers.  EKG was essentially normal.  He has had no recurrence of discomfort.  I did review of the data, and a remote CT scan of the abdomen demonstrated aortic atherosclerosis.  The last LDL cholesterol in 2018  recorded by [place] was 120.  He has no symptoms to suggest angina.  The patient does not have symptoms concerning for COVID-19 infection (fever, chills, cough, or new shortness of breath).    Past Medical History:  Diagnosis Date  . Angina    chest pain- cardiac cath. followed in 2010, record available ,  told then that he should f/u /w Dr. Marisue Humble    . Arthritis    R knee, back    . Asthma    uses  singulair  . Cancer [facility])    prostate, melanoma- 2010, excision    . Chronic kidney disease    prostate cancer - surg. removal- 1996  . Colon polyps   . Diverticulitis   . Dysrhythmia    palpitations, followed by Dr.Ehinger, seen in prep for surgery on 03/27/2011   . GERD (gastroesophageal reflux disease)   . Hepatitis    jaundice- many yrs. ago  . Hiatal hernia   . Hypertension   . Melanoma ([place])    Face  . OSA (obstructive sleep apnea) 04/11/2015   Mild to moderate OSA with AHI 13.3/hr overall and AHI 36.8/hr during REM sleep.  Oxygen saturations were as low as 86% during respiratory events.  . Pneumonia    hosp. 20 yrs. ago  . Prostate cancer ([place]) 1995  . Sleep apnea    study done, 10 yrs. ago, told that he needed  CPAP but  never used    Past Surgical History:  Procedure Laterality Date  .  CARDIAC CATHETERIZATION     2010  . CARDIAC CATHETERIZATION N/A 10/22/2015   Procedure: Left Heart Cath and Coronary Angiography;  Surgeon: Nelva Bush, MD;  Location: [facility];  Service: Cardiovascular;  Laterality: N/A;  . FRACTURE SURGERY     L wrist, hardware- 1982  . JOINT REPLACEMENT     L knee, 2009  . LEFT HEART CATHETERIZATION WITH CORONARY ANGIOGRAM N/A 09/08/2011   Procedure: LEFT HEART CATHETERIZATION WITH CORONARY ANGIOGRAM;  Surgeon: Sinclair Grooms, MD;  Location: [facility] C[facility];  Service: Cardiovascular;  Laterality: N/A;  . PROSTATECTOMY     for ca  . TONSILLECTOMY     as child  . TOTAL KNEE ARTHROPLASTY  04/22/2011   Procedure: TOTAL KNEE ARTHROPLASTY;   Surgeon: Garald Balding, MD;  Location: [facility];  Service: Orthopedics;  Laterality: Right;     Current Meds  Medication Sig  . albuterol (PROVENTIL HFA;VENTOLIN HFA) 108 (90 BASE) MCG/ACT inhaler Inhale 2 puffs into the lungs every 6 (six) hours as needed for wheezing or shortness of breath.  . budesonide-formoterol (SYMBICORT) 160-4.5 MCG/ACT inhaler Inhale 2 puffs into the lungs 2 (two) times daily.  . Calcium Carbonate-Vitamin D (CALCIUM + D) 600-200 MG-UNIT TABS Take 1 tablet by mouth 2 (two) times daily.  Marland Kitchen ELIQUIS 5 MG TABS tablet TAKE 1 TABLET BY MOUTH TWO  TIMES DAILY  . flecainide (TAMBOCOR) 50 MG tablet TAKE 1 TABLET BY MOUTH 2  TIMES DAILY.  . furosemide (LASIX) 20 MG tablet Take 20 mg by mouth daily.  Marland Kitchen HYDROcodone-acetaminophen (NORCO/VICODIN) 5-325 MG tablet Take 1 tablet by mouth every 6 (six) hours as needed for moderate pain.  . montelukast (SINGULAIR) 10 MG tablet Take 10 mg by mouth daily before breakfast.   . Multiple Vitamins tablet Take 1 tablet by mouth daily.  . nitroGLYCERIN (NITROSTAT) 0.4 MG SL tablet Place 1 tablet (0.4 mg total) under the tongue every 5 (five) minutes x 3 doses as needed for chest pain.  . Omega-3 Fatty Acids (FISH OIL) 1000 MG CAPS Take 1,000 mg by mouth 2 (two) times daily.  Marland Kitchen omeprazole (PRILOSEC) 40 MG capsule Take 40 mg by mouth at bedtime.   Marland Kitchen telmisartan (MICARDIS) 80 MG tablet Take 1 tablet (80 mg total) by mouth daily.  . vitamin E 1000 UNIT capsule Take 1,000 Units by mouth daily.     Allergies:   Aminophylline; Carvedilol; Lisinopril; Tenormin [atenolol]; and Zolpidem tartrate   Social History   Tobacco Use  . Smoking status: Former Smoker    Packs/day: 1.50    Years: 30.00    Pack years: 45.00    Types: Cigarettes    Last attempt to quit: 04/11/1983    Years since quitting: 35.2  . Smokeless tobacco: Never Used  Substance Use Topics  . Alcohol use: No    Alcohol/week: 0.0 standard drinks  . Drug use: No     Family Hx:  The patient's family history includes Alzheimer's disease in his father; Breast cancer in his mother; Crohn's disease in his brother; Heart disease in his brother; Hypertension in his brother. There is no history of Anesthesia problems, Hypotension, Malignant hyperthermia, or Pseudochol deficiency.  ROS:   Please see the history of present illness.    Some anxiety.  Worried about his blood pressure numbers.  Concerned about the pandemic. All other systems reviewed and are negative.   Prior CV studies:   The following studies were reviewed today:  CT scan pelvis  10/2017:Vascular/Lymphatic: Aortic atherosclerosis. No enlarged abdominal or pelvic lymph nodes.  Labs/Other Tests and Data Reviewed:    EKG:  An ECG dated 03/28/18 was personally reviewed today and demonstrated:  NSR with LAD and NSSTTWA  Recent Labs: 03/27/2018: BUN 18; Creatinine, Ser 1.26; Hemoglobin 14.5; Platelets 176; Potassium 4.0; Sodium 140   Recent Lipid Panel Lab Results  Component Value Date/Time   CHOL 182 02/28/2017 02:18 AM   TRIG 52 02/28/2017 02:18 AM   HDL 52 02/28/2017 02:18 AM   CHOLHDL 3.5 02/28/2017 02:18 AM   LDLCALC 120 (H) 02/28/2017 02:18 AM    Wt Readings from Last 3 Encounters:  07/14/18 245 lb (111.1 kg)  06/24/18 245 lb (111.1 kg)  05/25/18 246 lb (111.6 kg)     Objective:    Vital Signs:  BP 138/79   Pulse 64   Ht 5\' 7"  (1.702 m)   Wt 245 lb (111.1 kg)   BMI 38.37 kg/m   Gray beard VITAL SIGNS:  reviewed GEN:  no acute distress RESPIRATORY:  normal respiratory effort, symmetric expansion CARDIOVASCULAR:  no peripheral edema NEURO:  alert and oriented x 3, no obvious focal deficit  ASSESSMENT & PLAN:    1. PAF (paroxysmal atrial fibrillation) ([place])   2. Chronic anticoagulation   3. Essential hypertension   4. OSA (obstructive sleep apnea)   5. CKD (chronic kidney disease), stage III ([place])   6. Encounter for monitoring flecainide therapy   7. Aortic atherosclerosis ([place])    8. 2019 novel coronavirus disease (COVID-19)    PLAN:  1. May have had an episode of atrial fibrillation last evening.  He is currently back to baseline.  Plan to continue clinical observation. 2. Needs blood work twice a year while on apixaban to exclude the possibility of bleeding or change in kidney function that would lead to medication adjustment. 3. At his age target blood pressure is 140/80 mmHg or less.  I instructed him specifically to measure the blood pressure 2 to 3 hours after his daily a.m. medications. 4. I advocated compliance with CPAP. 5. Chronic kidney disease was not discussed 6. Based upon electrocardiogram in January, flecainide therapy is being tolerated well. 7. We should consider primary/secondary prevention.  One glaring oversight is the absence of treatment for aortic atherosclerosis in the presence of elevated LDL of 120.  This needs to be reconsidered.  Would recommend a low intensity statin perhaps rosuvastatin 5 or atorvastatin 10 mg/day.  Will enlist the opinion of Dr. Marisue Humble before starting the patient given his age.  Overall education and awareness concerning primary/secondary risk prevention was discussed in detail: LDL less than 70, hemoglobin A1c less than 7, blood pressure target less than 130/80 mmHg, >150 minutes of moderate aerobic activity per week, avoidance of smoking, weight control (via diet and exercise), and continued surveillance/management of/for obstructive sleep apnea.   COVID-19 Education: The signs and symptoms of COVID-19 were discussed with the patient and how to seek care for testing (follow up with PCP or arrange E-visit).  The importance of social distancing was discussed today.  Time:   Today, I have spent 20 minutes with the patient with telehealth technology discussing the above problems.     Medication Adjustments/Labs and Tests Ordered: Current medicines are reviewed at length with the patient today.  Concerns regarding  medicines are outlined above.   Tests Ordered: No orders of the defined types were placed in this encounter.   Medication Changes: No orders of the defined types were  placed in this encounter.   Disposition:  Follow up in 6 month(s)  Signed, Sinclair Grooms, MD  07/14/2018 12:15 PM    [place]

## 2018-07-14 ENCOUNTER — Other Ambulatory Visit: Payer: Self-pay

## 2018-07-14 ENCOUNTER — Encounter: Payer: Self-pay | Admitting: Interventional Cardiology

## 2018-07-14 ENCOUNTER — Telehealth (INDEPENDENT_AMBULATORY_CARE_PROVIDER_SITE_OTHER): Payer: Medicare Other | Admitting: Interventional Cardiology

## 2018-07-14 VITALS — BP 138/79 | HR 64 | Ht 67.0 in | Wt 245.0 lb

## 2018-07-14 DIAGNOSIS — I480 Paroxysmal atrial fibrillation: Secondary | ICD-10-CM | POA: Diagnosis not present

## 2018-07-14 DIAGNOSIS — I700 Atherosclerosis of aorta: Secondary | ICD-10-CM

## 2018-07-14 DIAGNOSIS — Z79899 Other long term (current) drug therapy: Secondary | ICD-10-CM

## 2018-07-14 DIAGNOSIS — U071 COVID-19: Secondary | ICD-10-CM

## 2018-07-14 DIAGNOSIS — G4733 Obstructive sleep apnea (adult) (pediatric): Secondary | ICD-10-CM

## 2018-07-14 DIAGNOSIS — I10 Essential (primary) hypertension: Secondary | ICD-10-CM

## 2018-07-14 DIAGNOSIS — Z5181 Encounter for therapeutic drug level monitoring: Secondary | ICD-10-CM

## 2018-07-14 DIAGNOSIS — N183 Chronic kidney disease, stage 3 unspecified: Secondary | ICD-10-CM

## 2018-07-14 DIAGNOSIS — Z7901 Long term (current) use of anticoagulants: Secondary | ICD-10-CM

## 2018-07-14 NOTE — Patient Instructions (Signed)
Medication Instructions:  Your physician recommends that you continue on your current medications as directed. Please refer to the Current Medication list given to you today.  If you need a refill on your cardiac medications before your next appointment, please call your pharmacy.   Lab work: None If you have labs (blood work) drawn today and your tests are completely normal, you will receive your results only by: Marland Kitchen MyChart Message (if you have MyChart) OR . A paper copy in the mail If you have any lab test that is abnormal or we need to change your treatment, we will call you to review the results.  Testing/Procedures: None  Follow-Up: At [facility], you and your health needs are our priority.  As part of our continuing mission to provide you with exceptional heart care, we have created designated Provider Care Teams.  These Care Teams include your primary Cardiologist (physician) and Advanced Practice Providers (APPs -  Physician Assistants and Nurse Practitioners) who all work together to provide you with the care you need, when you need it. You will need a follow up appointment in 6-9 months.  Please call our office 2 months in advance to schedule this appointment.  You may see Dr. Tamala Julian or one of the following Advanced Practice Providers on your designated Care Team:   Truitt Merle, NP Cecilie Kicks, NP . Kathyrn Drown, NP  Any Other Special Instructions Will Be Listed Below (If Applicable).

## 2018-07-23 ENCOUNTER — Other Ambulatory Visit: Payer: Self-pay | Admitting: Family Medicine

## 2018-07-23 ENCOUNTER — Other Ambulatory Visit: Payer: Self-pay

## 2018-07-23 ENCOUNTER — Ambulatory Visit
Admission: RE | Admit: 2018-07-23 | Discharge: 2018-07-23 | Disposition: A | Discharge status: Home / Self Care | Payer: Medicare Other | Source: Ambulatory Visit | Attending: Family Medicine | Admitting: Family Medicine

## 2018-07-23 DIAGNOSIS — R0600 Dyspnea, unspecified: Secondary | ICD-10-CM

## 2018-08-12 ENCOUNTER — Other Ambulatory Visit: Payer: Self-pay

## 2018-08-12 ENCOUNTER — Ambulatory Visit: Payer: Medicare Other | Admitting: Orthopaedic Surgery

## 2018-08-12 ENCOUNTER — Encounter: Payer: Self-pay | Admitting: Orthopaedic Surgery

## 2018-08-12 VITALS — BP 142/68 | HR 64 | Ht 67.0 in | Wt 245.0 lb

## 2018-08-12 DIAGNOSIS — M79672 Pain in left foot: Secondary | ICD-10-CM | POA: Insufficient documentation

## 2018-08-12 MED ORDER — LIDOCAINE HCL 1 % IJ SOLN
1.0000 mL | INTRAMUSCULAR | Status: AC | PRN
  Administered 2018-08-12: 1 mL

## 2018-08-12 MED ORDER — BUPIVACAINE HCL 0.5 % IJ SOLN
1.0000 mL | INTRAMUSCULAR | Status: AC | PRN
  Administered 2018-08-12: 1 mL

## 2018-08-12 MED ORDER — METHYLPREDNISOLONE ACETATE 40 MG/ML IJ SUSP
40.0000 mg | INTRAMUSCULAR | Status: AC | PRN
  Administered 2018-08-12: 40 mg

## 2018-08-12 NOTE — Progress Notes (Signed)
Office Visit Note   Patient: [name] [name]           Date of Birth: 05/14/1940           MRN: [id number] Visit Date: 08/12/2018              Requested by: Gaynelle Arabian, MD [address] [place], [place] [address] PCP: Gaynelle Arabian, MD   Assessment & Plan: Visit Diagnoses:  1. Left foot pain     Plan: Mr. [name] [name] was seen in the office of about 6 to 7 weeks ago for pain in the plantar aspect of his left heel.  The pain was consistent with plantar fasciitis.  Despite shoe inserts and cortisone injection he notes the pain has recurred.  It is localized along the lateral aspect of his heel.  He has not experienced any numbness tingling erythema or ecchymosis.  I think he has a variant of plantar fasciitis but he could have a bone bruise or a lesion of the lateral calcaneal nerve branch I think it is worth repeating the injection in the area of greatest tenderness and then monitoring his response.  If no improvement consider an MRI scan  Follow-Up Instructions: Return if symptoms worsen or fail to improve.   Orders:  Orders Placed This Encounter  Procedures  . Foot Inj   No orders of the defined types were placed in this encounter.     Procedures: Foot Inj Date/Time: 08/12/2018 11:41 AM Performed by: Garald Balding, MD Authorized by: Garald Balding, MD   Consent Given by:  Patient Site marked: the procedure site was marked   Condition: Plantar Fasciitis   Location: left plantar fascia muscle   Needle Size:  27 G Approach:  Lateral Medications:  1 mL lidocaine 1 %; 1 mL bupivacaine 0.5 %; 40 mg methylPREDNISolone acetate 40 MG/ML     Clinical Data: No additional findings.   Subjective: Chief Complaint  Patient presents with  . Left Foot - Follow-up  Patient presents today for a follow up on his left foot. He was here seven weeks ago for plantar fasciitis and received a cortisone injection. Patient states that the injection did not help  at all. He Is having pain on the bottom of his heel that remains constant, but gets worse with weightbearing. He is not taking anything for pain.  Denies any ankle pain.  Has been wearing a full-length arch supports  HPI  Review of Systems  Constitutional: Negative for fatigue.  HENT: Negative for ear pain.   Eyes: Negative for pain.  Respiratory: Negative for shortness of breath.   Cardiovascular: Positive for leg swelling.  Gastrointestinal: Negative for constipation and diarrhea.  Endocrine: Negative for cold intolerance and heat intolerance.  Genitourinary: Negative for difficulty urinating.  Musculoskeletal: Negative for joint swelling.  Skin: Negative for rash.  Allergic/Immunologic: Negative for food allergies.  Neurological: Positive for weakness.  Hematological: Does not bruise/bleed easily.  Psychiatric/Behavioral: Negative for sleep disturbance.     Objective: Vital Signs: BP (!) 142/68   Pulse 64   Ht 5\' 7"  (1.702 m)   Wt 245 lb (111.1 kg)   BMI 38.37 kg/m   Physical Exam Constitutional:      Appearance: He is well-developed.  Eyes:     Pupils: Pupils are equal, round, and reactive to light.  Pulmonary:     Effort: Pulmonary effort is normal.  Skin:    General: Skin is warm and dry.  Neurological:  Mental Status: He is alert and oriented to person, place, and time.  Psychiatric:        Behavior: Behavior normal.     Ortho Exam left foot with localized tenderness along the lateral os calcis on the plantar surface.  No masses.  No erythema or ecchymosis.  No pain medially fascial insertion no palpable tenderness along the lateral aspect of his ankle or behind the lateral malleolus. Specialty Comments:  No specialty comments available.  Imaging: No results found.   PMFS History: Patient Active Problem List   Diagnosis Date Noted  . Left foot pain 08/12/2018  . Constipation   . Lobar pneumonia ([place]) 02/27/2017  . COPD with acute exacerbation  ([place]) 02/27/2017  . GERD (gastroesophageal reflux disease) 02/27/2017  . CKD (chronic kidney disease), stage III ([place]) 02/27/2017  . Acute and chronic respiratory failure with hypoxia ([place]) 02/27/2017  . Anxiety 10/30/2015  . Dyspnea 08/20/2015  . Solitary pulmonary nodule 05/18/2015  . OSA (obstructive sleep apnea) 04/11/2015  . Chronic anticoagulation 03/17/2015  . Asthma, chronic obstructive, with acute exacerbation ([place]) 02/26/2015  . PAF (paroxysmal atrial fibrillation) ([place]) 01/26/2015  . Postoperative anemia due to acute blood loss 04/24/2011  . Osteoarthritis of knee 04/23/2011  . Hypertension 04/23/2011  . History of prostate cancer 04/23/2011  . Melanoma ([place]) 04/23/2011  . Hiatal hernia 04/23/2011  . Asthma 04/23/2011  . Obesity, Class II, BMI 35-39.9, with comorbidity ([place]) 04/23/2011   Past Medical History:  Diagnosis Date  . Angina    chest pain- cardiac cath. followed in 2010, record available ,  told then that he should f/u /w Dr. Marisue Humble    . Arthritis    R knee, back    . Asthma    uses  singulair  . Cancer [facility])    prostate, melanoma- 2010, excision    . Chronic kidney disease    prostate cancer - surg. removal- 1996  . Colon polyps   . Diverticulitis   . Dysrhythmia    palpitations, followed by Dr.Ehinger, seen in prep for surgery on 03/27/2011   . GERD (gastroesophageal reflux disease)   . Hepatitis    jaundice- many yrs. ago  . Hiatal hernia   . Hypertension   . 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 OSA (obstructive sleep apnea) 04/11/2015   Mild to moderate OSA with AHI 13.3/hr overall and AHI 36.8/hr during REM sleep.  Oxygen saturations were as low as 86% during respiratory events.  . Pneumonia    hosp. 20 yrs. ago  . Prostate cancer ([place]) 1995  . Sleep apnea    study done, 10 yrs. ago, told that he needed  CPAP but  never used     Family History  Problem Relation Age of Onset  . Crohn's disease Brother   . Breast cancer Mother   . Alzheimer's disease  Father   . Heart disease Brother   . Hypertension Brother   . Anesthesia problems Neg Hx   . Hypotension Neg Hx   . Malignant hyperthermia Neg Hx   . Pseudochol deficiency Neg Hx     Past Surgical History:  Procedure Laterality Date  . CARDIAC CATHETERIZATION     2010  . CARDIAC CATHETERIZATION N/A 10/22/2015   Procedure: Left Heart Cath and Coronary Angiography;  Surgeon: Nelva Bush, MD;  Location: [facility];  Service: Cardiovascular;  Laterality: N/A;  . FRACTURE SURGERY     L wrist, hardware- 1982  . JOINT REPLACEMENT  L knee, 2009  . LEFT HEART CATHETERIZATION WITH CORONARY ANGIOGRAM N/A 09/08/2011   Procedure: LEFT HEART CATHETERIZATION WITH CORONARY ANGIOGRAM;  Surgeon: Sinclair Grooms, MD;  Location: [facility] CATH LAB;  Service: Cardiovascular;  Laterality: N/A;  . PROSTATECTOMY     for ca  . TONSILLECTOMY     as child  . TOTAL KNEE ARTHROPLASTY  04/22/2011   Procedure: TOTAL KNEE ARTHROPLASTY;  Surgeon: Garald Balding, MD;  Location: [facility];  Service: Orthopedics;  Laterality: Right;   Social History   Occupational History  . Not on file  Tobacco Use  . Smoking status: Former Smoker    Packs/day: 1.50    Years: 30.00    Pack years: 45.00    Types: Cigarettes    Last attempt to quit: 04/11/1983    Years since quitting: 35.3  . Smokeless tobacco: Never Used  Substance and Sexual Activity  . Alcohol use: No    Alcohol/week: 0.0 standard drinks  . Drug use: No  . Sexual activity: Never    Birth control/protection: Condom, None

## 2018-08-19 IMAGING — DX DG ABDOMEN 1V
2 series · 2 of 2 positions shown · non-contrast
Comparison: None.

CLINICAL DATA: Constipation

EXAM:
ABDOMEN - 1 VIEW

[abdomen kub (1 of 2)]
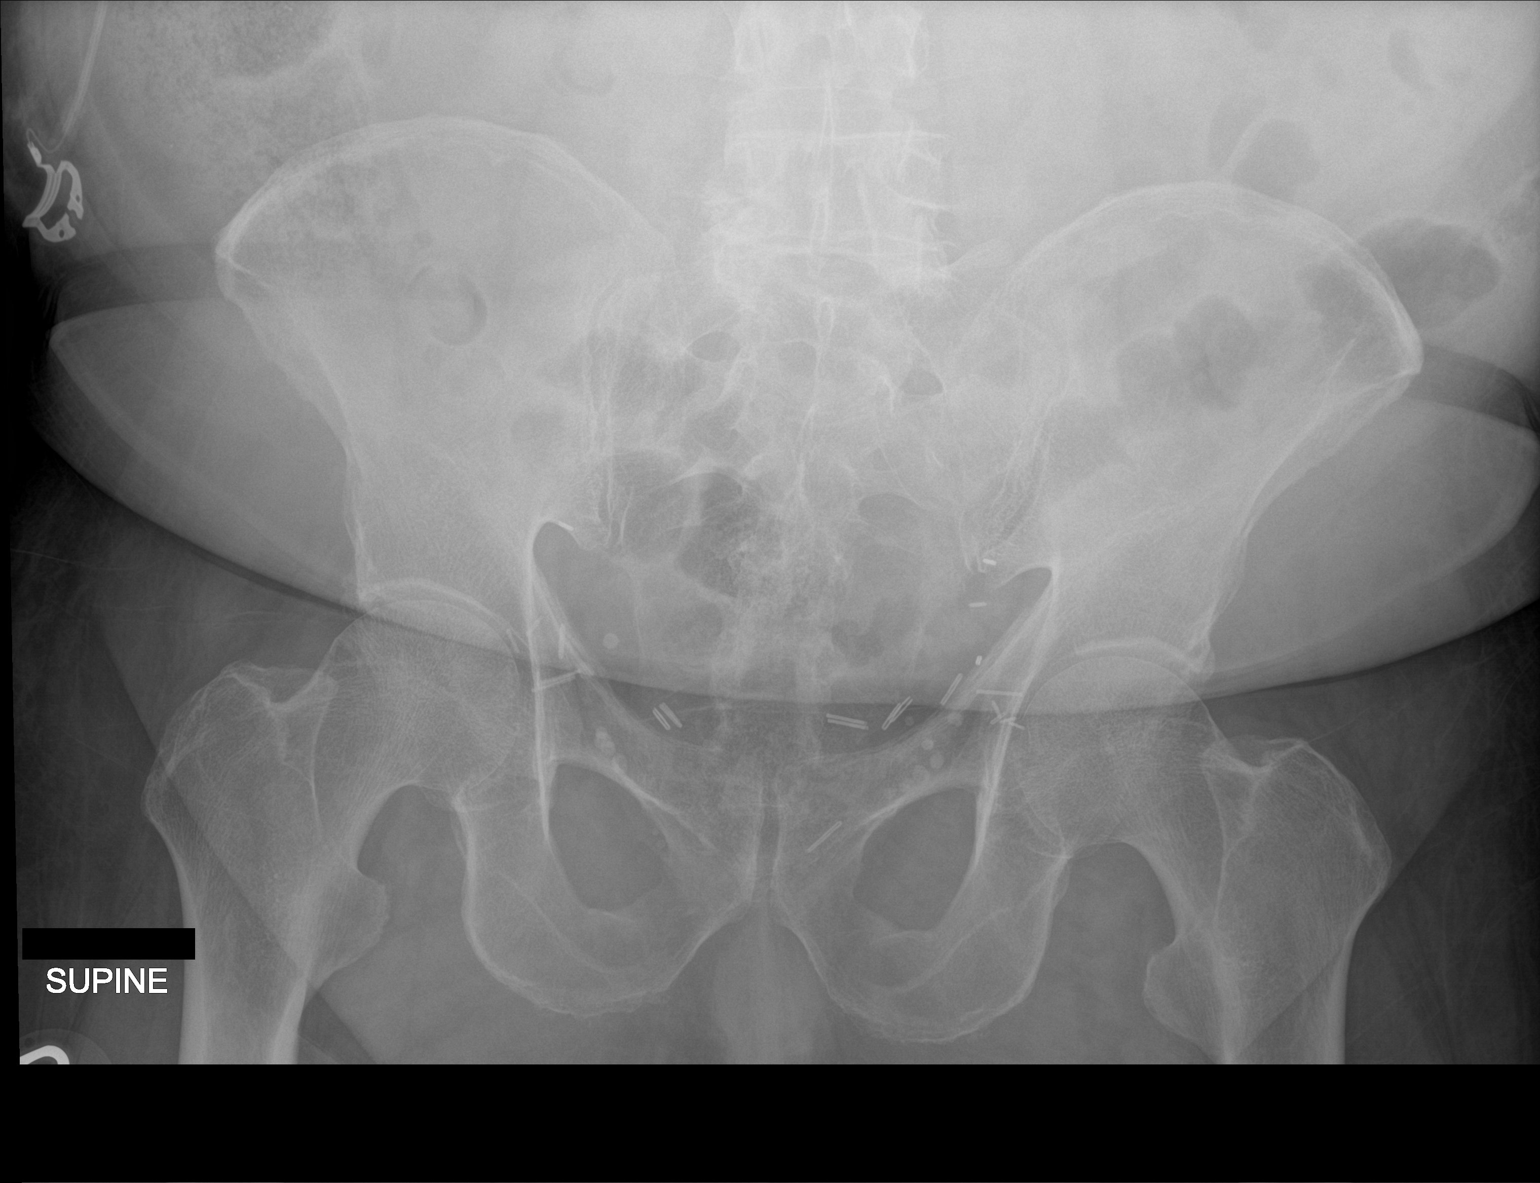

[abdomen kub (2 of 2)]
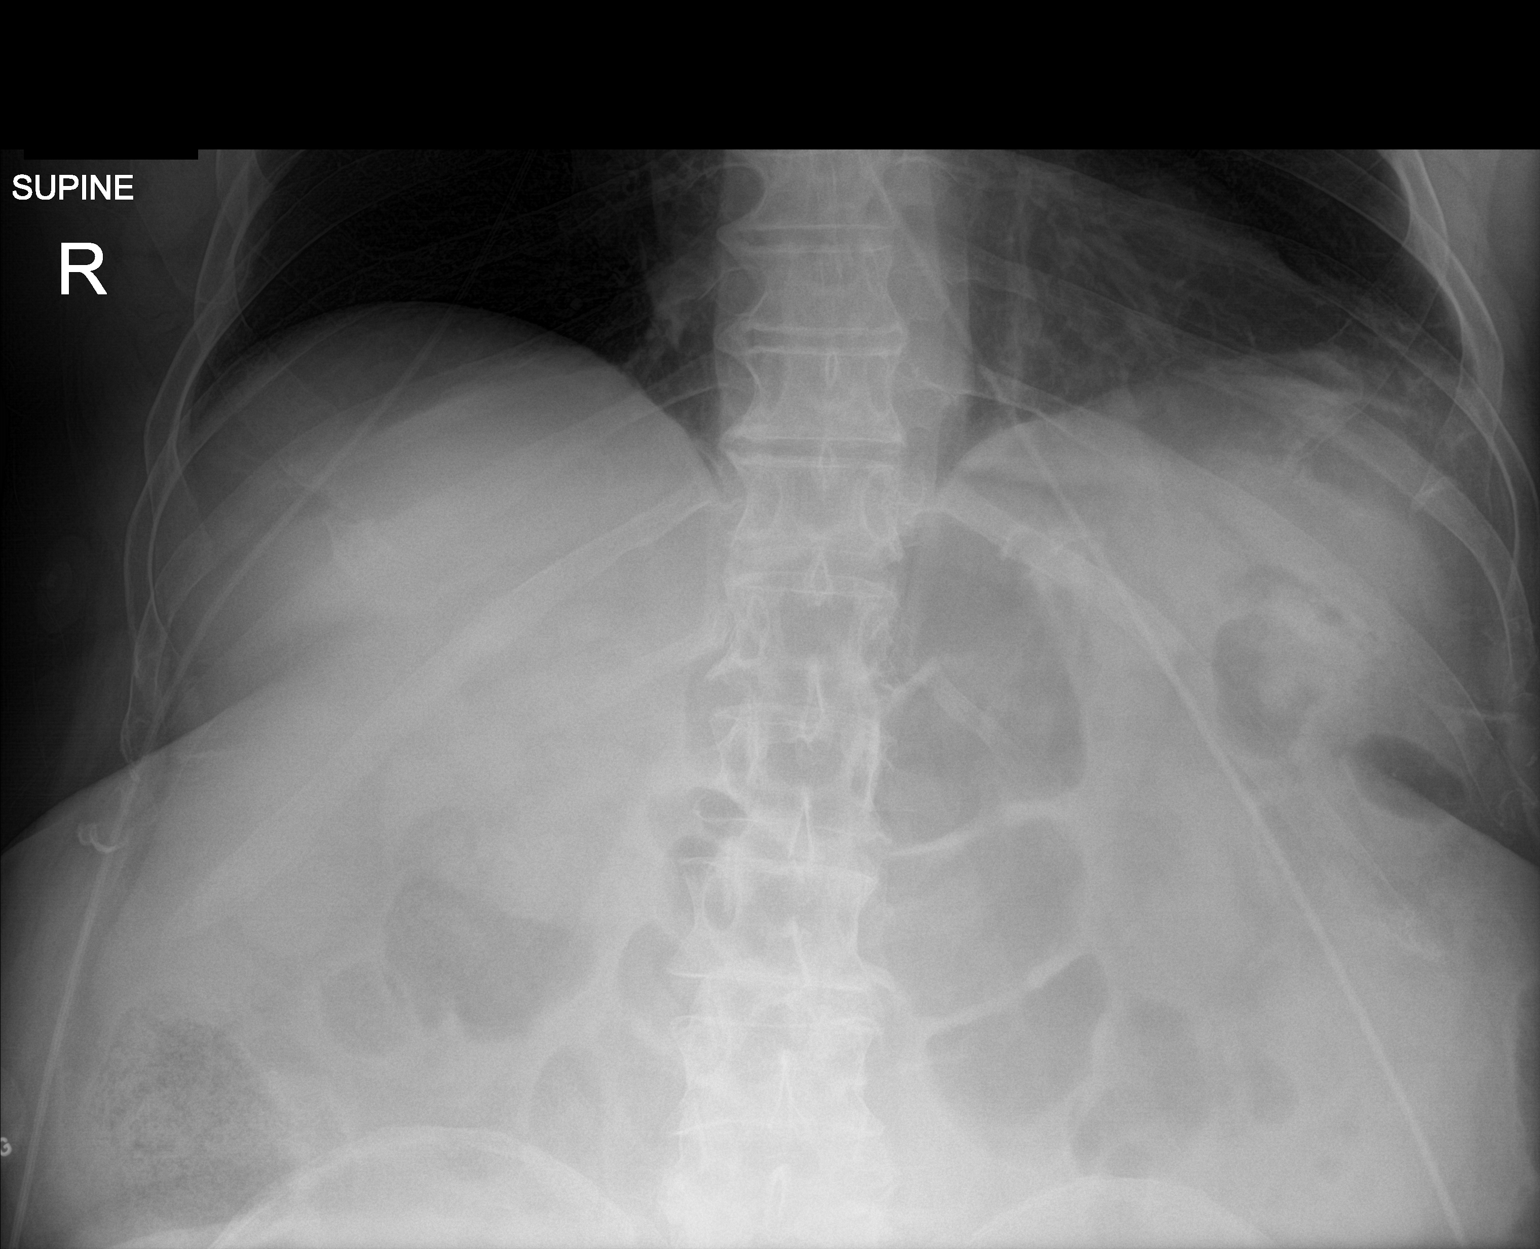

[2 of 2 positions shown; findings below may reference images not displayed]

FINDINGS: Nonobstructive bowel gas pattern. No free air organomegaly. No
suspicious calcification. Calcified phleboliths in the lower pelvis.
Surgical clips in the lower pelvis. No acute bony abnormality.
Visualized lung bases clear.
IMPRESSION: No acute findings.

## 2018-09-06 ENCOUNTER — Encounter: Payer: Self-pay | Admitting: Emergency Medicine

## 2018-09-06 ENCOUNTER — Other Ambulatory Visit: Payer: Self-pay

## 2018-09-06 ENCOUNTER — Ambulatory Visit
Admission: EM | Admit: 2018-09-06 | Discharge: 2018-09-06 | Disposition: A | Discharge status: Home / Self Care | Payer: Medicare Other | Attending: Physician Assistant | Admitting: Physician Assistant

## 2018-09-06 ENCOUNTER — Ambulatory Visit (INDEPENDENT_AMBULATORY_CARE_PROVIDER_SITE_OTHER): Payer: Medicare Other

## 2018-09-06 DIAGNOSIS — I10 Essential (primary) hypertension: Secondary | ICD-10-CM

## 2018-09-06 DIAGNOSIS — M545 Low back pain, unspecified: Secondary | ICD-10-CM

## 2018-09-06 LAB — POCT URINALYSIS DIP (MANUAL ENTRY)
Bilirubin, UA: NEGATIVE
Glucose, UA: NEGATIVE mg/dL
Ketones, POC UA: NEGATIVE mg/dL
Leukocytes, UA: NEGATIVE
Nitrite, UA: NEGATIVE
Protein Ur, POC: NEGATIVE mg/dL
Spec Grav, UA: 1.015 (ref 1.010–1.025)
Urobilinogen, UA: 1 E.U./dL
pH, UA: 5.5 (ref 5.0–8.0)

## 2018-09-06 MED ORDER — HYDROCODONE-ACETAMINOPHEN 5-325 MG PO TABS: 1.0000 | ORAL_TABLET | ORAL | 0 refills | Status: AC | PRN

## 2018-09-06 MED ORDER — PREDNISONE 50 MG PO TABS: ORAL_TABLET | ORAL | 0 refills | Status: DC

## 2018-09-06 NOTE — ED Notes (Signed)
Patient able to ambulate independently  

## 2018-09-06 NOTE — ED Provider Notes (Signed)
[facility]    CSN: [id number] Arrival date & time: 09/06/18  1006      History   Chief Complaint Chief Complaint  Patient presents with  . Back Pain  . Headache    HPI [name] [name] is a 78 y.o. male.   The history is provided by the patient. No language interpreter was used.  Back Pain Location:  Lumbar spine Quality:  Aching Radiates to:  Does not radiate Pain severity:  Moderate Pain is:  Same all the time Duration:  2 weeks Timing:  Constant Progression:  Worsening Chronicity:  New Relieved by:  Nothing Worsened by:  Nothing Ineffective treatments:  None tried Associated symptoms: no fever   Pt reports he has had low back pain for 2 weeks.  Pt denies any injury.    Past Medical History:  Diagnosis Date  . Angina    chest pain- cardiac cath. followed in 2010, record available ,  told then that he should f/u /w Dr. Marisue Humble    . Arthritis    R knee, back    . Asthma    uses  singulair  . Cancer [facility])    prostate, melanoma- 2010, excision    . Chronic kidney disease    prostate cancer - surg. removal- 1996  . Colon polyps   . Diverticulitis   . Dysrhythmia    palpitations, followed by Dr.Ehinger, seen in prep for surgery on 03/27/2011   . GERD (gastroesophageal reflux disease)   . Hepatitis    jaundice- many yrs. ago  . Hiatal hernia   . Hypertension   . Melanoma ([place])    Face  . OSA (obstructive sleep apnea) 04/11/2015   Mild to moderate OSA with AHI 13.3/hr overall and AHI 36.8/hr during REM sleep.  Oxygen saturations were as low as 86% during respiratory events.  . Pneumonia    hosp. 20 yrs. ago  . Prostate cancer ([place]) 1995  . Sleep apnea    study done, 10 yrs. ago, told that he needed  CPAP but  never used     Patient Active Problem List   Diagnosis Date Noted  . Left foot pain 08/12/2018  . Constipation   . Lobar pneumonia ([place]) 02/27/2017  . COPD with acute exacerbation ([place]) 02/27/2017  . GERD (gastroesophageal reflux  disease) 02/27/2017  . CKD (chronic kidney disease), stage III ([place]) 02/27/2017  . Acute and chronic respiratory failure with hypoxia ([place]) 02/27/2017  . Anxiety 10/30/2015  . Dyspnea 08/20/2015  . Solitary pulmonary nodule 05/18/2015  . OSA (obstructive sleep apnea) 04/11/2015  . Chronic anticoagulation 03/17/2015  . Asthma, chronic obstructive, with acute exacerbation ([place]) 02/26/2015  . PAF (paroxysmal atrial fibrillation) ([place]) 01/26/2015  . Postoperative anemia due to acute blood loss 04/24/2011  . Osteoarthritis of knee 04/23/2011  . Hypertension 04/23/2011  . History of prostate cancer 04/23/2011  . Melanoma ([place]) 04/23/2011  . Hiatal hernia 04/23/2011  . Asthma 04/23/2011  . Obesity, Class II, BMI 35-39.9, with comorbidity ([place]) 04/23/2011    Past Surgical History:  Procedure Laterality Date  . CARDIAC CATHETERIZATION     2010  . CARDIAC CATHETERIZATION N/A 10/22/2015   Procedure: Left Heart Cath and Coronary Angiography;  Surgeon: Nelva Bush, MD;  Location: [facility];  Service: Cardiovascular;  Laterality: N/A;  . FRACTURE SURGERY     L wrist, hardware- 1982  . JOINT REPLACEMENT     L knee, 2009  . LEFT HEART CATHETERIZATION WITH CORONARY ANGIOGRAM  N/A 09/08/2011   Procedure: LEFT HEART CATHETERIZATION WITH CORONARY ANGIOGRAM;  Surgeon: Sinclair Grooms, MD;  Location: [facility];  Service: Cardiovascular;  Laterality: N/A;  . PROSTATECTOMY     for ca  . TONSILLECTOMY     as child  . TOTAL KNEE ARTHROPLASTY  04/22/2011   Procedure: TOTAL KNEE ARTHROPLASTY;  Surgeon: Garald Balding, MD;  Location: [facility];  Service: Orthopedics;  Laterality: Right;       Home Medications    Prior to Admission medications   Medication Sig Start Date End Date Taking? Authorizing Provider  albuterol (PROVENTIL HFA;VENTOLIN HFA) 108 (90 BASE) MCG/ACT inhaler Inhale 2 puffs into the lungs every 6 (six) hours as needed for wheezing or shortness of breath.    [provider]  budesonide-formoterol (SYMBICORT) 160-4.5 MCG/ACT inhaler Inhale 2 puffs into the lungs 2 (two) times daily. 05/22/17   Juanito Doom, MD  Calcium Carbonate-Vitamin D (CALCIUM + D) 600-200 MG-UNIT TABS Take 1 tablet by mouth 2 (two) times daily.    [provider]  ELIQUIS 5 MG TABS tablet TAKE 1 TABLET BY MOUTH TWO  TIMES DAILY 03/22/18   Belva Crome, MD  flecainide (TAMBOCOR) 50 MG tablet TAKE 1 TABLET BY MOUTH 2  TIMES DAILY. 04/29/18   Belva Crome, MD  furosemide (LASIX) 20 MG tablet Take 20 mg by mouth daily. 06/02/18   [provider]  HYDROcodone-acetaminophen (NORCO/VICODIN) 5-325 MG tablet Take 1 tablet by mouth every 6 (six) hours as needed for moderate pain. 03/27/18   Dorie Rank, MD  montelukast (SINGULAIR) 10 MG tablet Take 10 mg by mouth daily before breakfast.     [provider]  Multiple Vitamins tablet Take 1 tablet by mouth daily.    [provider]  nitroGLYCERIN (NITROSTAT) 0.4 MG SL tablet Place 1 tablet (0.4 mg total) under the tongue every 5 (five) minutes x 3 doses as needed for chest pain. 03/18/15   Erlene Quan, PA-C  Omega-3 Fatty Acids (FISH OIL) 1000 MG CAPS Take 1,000 mg by mouth 2 (two) times daily.    [provider]  omeprazole (PRILOSEC) 40 MG capsule Take 40 mg by mouth at bedtime.     [provider]  telmisartan (MICARDIS) 80 MG tablet Take 1 tablet (80 mg total) by mouth daily. 03/22/18   Belva Crome, MD  vitamin E 1000 UNIT capsule Take 1,000 Units by mouth daily.    [provider]    Family History Family History  Problem Relation Age of Onset  . Crohn's disease Brother   . Breast cancer Mother   . Alzheimer's disease Father   . Heart disease Brother   . Hypertension Brother   . Anesthesia problems Neg Hx   . Hypotension Neg Hx   . Malignant hyperthermia Neg Hx   . Pseudochol deficiency Neg Hx     Social History Social History   Tobacco Use  . Smoking  status: Former Smoker    Packs/day: 1.50    Years: 30.00    Pack years: 45.00    Types: Cigarettes    Quit date: 04/11/1983    Years since quitting: 35.4  . Smokeless tobacco: Never Used  Substance Use Topics  . Alcohol use: No    Alcohol/week: 0.0 standard drinks  . Drug use: No     Allergies   Aminophylline, Carvedilol, Lisinopril, Tenormin [atenolol], and Zolpidem tartrate   Review of Systems Review of Systems  Constitutional: Negative for fever.  Musculoskeletal: Positive for back pain.  All other systems reviewed and are negative.    Physical Exam Triage Vital Signs ED Triage Vitals [09/06/18 1014]  Enc Vitals Group     BP (!) 164/70     Pulse Rate 71     Resp 18     Temp 97.8 F (36.6 C)     Temp Source Oral     SpO2 94 %     Weight      Height      Head Circumference      Peak Flow      Pain Score 3     Pain Loc      Pain Edu?      Excl. in [place]?    No data found.  Updated Vital Signs BP (!) 164/70 (BP Location: Left Arm)   Pulse 71   Temp 97.8 F (36.6 C) (Oral)   Resp 18   SpO2 94%   Visual Acuity Right Eye Distance:   Left Eye Distance:   Bilateral Distance:    Right Eye Near:   Left Eye Near:    Bilateral Near:     Physical Exam Vitals signs and nursing note reviewed.  Constitutional:      Appearance: He is well-developed.  HENT:     Head: Normocephalic.  Neck:     Musculoskeletal: Normal range of motion.  Cardiovascular:     Rate and Rhythm: Normal rate.     Heart sounds: Normal heart sounds.  Pulmonary:     Effort: Pulmonary effort is normal.  Abdominal:     General: There is no distension.  Musculoskeletal: Normal range of motion.     Comments: Tender low back, pain with movement,  nv and ns intact   Neurological:     Mental Status: He is alert and oriented to person, place, and time.      UC Treatments / Results  Labs (all labs ordered are listed, but only abnormal results are displayed) Labs Reviewed  POCT  URINALYSIS DIP (MANUAL ENTRY) - Abnormal; Notable for the following components:      Result Value   Blood, UA trace-intact (*)    All other components within normal limits    EKG None  Radiology Dg Lumbar Spine Complete  Result Date: 09/06/2018 CLINICAL DATA:  Back pain for 2 weeks. EXAM: LUMBAR SPINE - COMPLETE 4+ VIEW COMPARISON:  Lumbar spine MRI 10/24/2014 FINDINGS: Normal alignment of the lumbar vertebral bodies. No acute bony findings or destructive bony changes. Small remnant disc space again noted at S1-2. Moderate lower lumbar facet disease without definite pars defects. Moderate degenerative disc disease at L5-S1. The visualized bony pelvis is intact.  The SI joints appear normal. Moderate aortoiliac vascular calcifications without definite aneurysm. Surgical changes in the pelvis likely related to prostatectomy. IMPRESSION: Normal alignment and no acute bony findings. Moderate degenerate disc disease at L5-S1. Electronically Signed   By: Marijo Sanes M.D.   On: 09/06/2018 10:54    Procedures Procedures (including critical care time)  Medications Ordered in UC Medications - No data to display  Initial Impression / Assessment and Plan / UC Course  I have reviewed the triage vital signs and the nursing notes.  Pertinent labs & imaging results that were available during my care of the patient were reviewed by me and considered in my medical decision making (see chart for details).     MDM  Pt had ct abd and  pelvis 10/2017  No renal disease, normal aorta.  I suspect musculoskeletal disease second to Degenerative disc disease.  Pt given rx for prednisone and hydrocodone.  Pt advised if not relieved he may need MRi.  Pt advised to follow up with his MD for recheck  Final Clinical Impressions(s) / UC Diagnoses   Final diagnoses:  Acute right-sided low back pain without sciatica     Discharge Instructions     See your Physician for recheck in 3-4 days.  You may need to have a  ct scan if pain persist     ED Prescriptions    Medication Sig Dispense Auth. Provider   HYDROcodone-acetaminophen (NORCO/VICODIN) 5-325 MG tablet Take 1 tablet by mouth every 4 (four) hours as needed for moderate pain. 20 tablet Senica Crall K, PA-C   predniSONE (DELTASONE) 50 MG tablet One tablet a day 5 tablet Fransico Meadow, [place]     Controlled Substance Prescriptions [place] Controlled Substance Registry consulted? Yes, I have consulted the [place] Controlled Substances Registry for this patient, and feel the risk/benefit ratio today is favorable for proceeding with this prescription for a controlled substance.   Fransico Meadow, [place] 09/06/18 1420

## 2018-09-06 NOTE — ED Triage Notes (Signed)
Pt presents to [facility] for assessment of right lower back pain x 2 weeks.  Denies any known injuries.  Pt states he is on a fluid pill and is unsure if he has had any changes in urination.  Also c/o of a left sided stabbing pain to the left side of his head which occurs every 10-20 minutes and lasts just a few seconds that has been going on for 2 days.

## 2018-09-06 NOTE — Discharge Instructions (Signed)
See your Physician for recheck in 3-4 days.  You may need to have a ct scan if pain persist

## 2018-09-20 ENCOUNTER — Telehealth: Payer: Self-pay | Admitting: Orthopaedic Surgery

## 2018-09-20 ENCOUNTER — Other Ambulatory Visit: Payer: Self-pay | Admitting: *Deleted

## 2018-09-20 DIAGNOSIS — M79672 Pain in left foot: Secondary | ICD-10-CM

## 2018-09-20 NOTE — Progress Notes (Signed)
Mr left

## 2018-09-20 NOTE — Telephone Encounter (Signed)
I called patient, MRI ordered

## 2018-09-20 NOTE — Telephone Encounter (Signed)
Patient still having pain with his heel. Patient's wife not sure which one. Patient would like to proceed with MRI. Please call to advise.

## 2018-09-23 ENCOUNTER — Telehealth: Payer: Self-pay | Admitting: Orthopaedic Surgery

## 2018-09-23 NOTE — Telephone Encounter (Signed)
I called patient with [place] phone #

## 2018-09-23 NOTE — Telephone Encounter (Signed)
Patient's wife calling because they have not been contacted yet to schedule foot Mri. Please call to advise of status.

## 2018-10-01 ENCOUNTER — Other Ambulatory Visit: Payer: Self-pay | Admitting: Interventional Cardiology

## 2018-10-05 ENCOUNTER — Ambulatory Visit (INDEPENDENT_AMBULATORY_CARE_PROVIDER_SITE_OTHER): Payer: Medicare Other | Admitting: Orthopaedic Surgery

## 2018-10-05 ENCOUNTER — Other Ambulatory Visit: Payer: Self-pay

## 2018-10-05 ENCOUNTER — Encounter: Payer: Self-pay | Admitting: Orthopaedic Surgery

## 2018-10-05 VITALS — BP 161/71 | HR 63 | Ht 67.0 in | Wt 245.0 lb

## 2018-10-05 DIAGNOSIS — M545 Low back pain, unspecified: Secondary | ICD-10-CM | POA: Insufficient documentation

## 2018-10-05 DIAGNOSIS — R319 Hematuria, unspecified: Secondary | ICD-10-CM

## 2018-10-05 DIAGNOSIS — G8929 Other chronic pain: Secondary | ICD-10-CM | POA: Diagnosis not present

## 2018-10-05 NOTE — Progress Notes (Signed)
Office Visit Note   Patient: [name] [name]           Date of Birth: 12-06-1940           MRN: [id number] Visit Date: 10/05/2018              Requested by: Gaynelle Arabian, MD [address] [place],  [place] [address] PCP: Gaynelle Arabian, MD   Assessment & Plan: Visit Diagnoses:  1. Hematuria, unspecified type   2. Chronic right-sided low back pain without sciatica     Plan: Chronic recurrent right-sided low back pain.  No radicular symptoms.  Also had trace blood in urine through urgent care center evaluation.  Will repeat urine analysis.  Asked Dr. Ernestina Patches to evaluate for L4-5 and L5-S1 on the right facet injections.  Return in 1 month.  If there is recurrent blood in the urine I will refer to either his primary care physician or the urologist.,  Office visit over 40 minutes 50% of the time in counseling  Follow-Up Instructions: No follow-ups on file.   Orders:  Orders Placed This Encounter  Procedures  . Urinalysis, Routine w reflex microscopic   No orders of the defined types were placed in this encounter.     Procedures: No procedures performed   Clinical Data: No additional findings.   Subjective: Chief Complaint  Patient presents with  . Lower Back - Pain  Patient presents today with lower back pain X 3 weeks. No known injury. He went to urgent care three week ago for lower back pain and right sided pain. He had x-rays and also had his urine checked. He had blood in his urine. He was told he may have a kidney stone, but would need an MRI because it did not show up on x-ray. He was then referred here for his back. He has not had an MRI. Any movement causes pain on the right side of his lower back. He has no pain, numbness, or tingling down his lower extremities. He has been taking hydrocodone.  Has had a long history of prior problems with his lumbar spine.  He had an MRI scan in 2016 demonstrating progressive lumbar degenerative disc and facet  disease particularly on the right at L4-5 and L5-S1.  There was foraminal stenosis most pronounced on the right potentially affecting the right L3-L4 and L5 nerve roots.  There has been no radicular discomfort.  Has not noticed any gross hematuria.  Still not having any pain to either lower extremity.  Pain is mostly on the right side and may or may not be similar to the pain that he has had in the past.  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 I did review the films of his lumbar spine recently performed at the end of June on the PACS system.  There is significant decrease in the disc space height at L5-S1.  No listhesis.  Diffuse facet changes throughout the lumbar spine.  Also diffuse calcification of the abdominal aorta without obvious aneurysmal dilatation mild degenerative scoliosis less than 10 degrees to the right.  No acute changes HPI  Review of Systems   Objective: Vital Signs: BP (!) 161/71   Pulse 63   Ht 5\' 7"  (1.702 m)   Wt 245 lb (111.1 kg)   BMI 38.37 kg/m   Physical Exam Constitutional:      Appearance: He is well-developed.  Eyes:     Pupils:  Pupils are equal, round, and reactive to light.  Pulmonary:     Effort: Pulmonary effort is normal.  Skin:    General: Skin is warm and dry.  Neurological:     Mental Status: He is alert and oriented to person, place, and time.  Psychiatric:        Behavior: Behavior normal.     Ortho Exam awake alert and oriented x3.  Comfortable sitting.  Did not have any flank pain bilaterally.  Very minimal percussible pain in the lumbar spine.  Straight leg raise negative.  Painless range of motion both hips  Specialty Comments:  No specialty comments available.  Imaging: No results found.   PMFS History: Patient Active Problem List   Diagnosis Date Noted  . Low back pain 10/05/2018  . Left foot pain 08/12/2018  . Constipation   . Lobar pneumonia ([place]) 02/27/2017  . COPD  with acute exacerbation ([place]) 02/27/2017  . GERD (gastroesophageal reflux disease) 02/27/2017  . CKD (chronic kidney disease), stage III ([place]) 02/27/2017  . Acute and chronic respiratory failure with hypoxia ([place]) 02/27/2017  . Anxiety 10/30/2015  . Dyspnea 08/20/2015  . Solitary pulmonary nodule 05/18/2015  . OSA (obstructive sleep apnea) 04/11/2015  . Chronic anticoagulation 03/17/2015  . Asthma, chronic obstructive, with acute exacerbation ([place]) 02/26/2015  . PAF (paroxysmal atrial fibrillation) ([place]) 01/26/2015  . Postoperative anemia due to acute blood loss 04/24/2011  . Osteoarthritis of knee 04/23/2011  . Hypertension 04/23/2011  . History of prostate cancer 04/23/2011  . Melanoma ([place]) 04/23/2011  . Hiatal hernia 04/23/2011  . Asthma 04/23/2011  . Obesity, Class II, BMI 35-39.9, with comorbidity ([place]) 04/23/2011   Past Medical History:  Diagnosis Date  . Angina    chest pain- cardiac cath. followed in 2010, record available ,  told then that he should f/u /w Dr. Marisue Humble    . Arthritis    R knee, back    . Asthma    uses  singulair  . Cancer [facility])    prostate, melanoma- 2010, excision    . Chronic kidney disease    prostate cancer - surg. removal- 1996  . Colon polyps   . Diverticulitis   . Dysrhythmia    palpitations, followed by Dr.Ehinger, seen in prep for surgery on 03/27/2011   . GERD (gastroesophageal reflux disease)   . Hepatitis    jaundice- many yrs. ago  . Hiatal hernia   . Hypertension   . Melanoma ([place])    Face  . OSA (obstructive sleep apnea) 04/11/2015   Mild to moderate OSA with AHI 13.3/hr overall and AHI 36.8/hr during REM sleep.  Oxygen saturations were as low as 86% during respiratory events.  . Pneumonia    hosp. 20 yrs. ago  . Prostate cancer ([place]) 1995  . Sleep apnea    study done, 10 yrs. ago, told that he needed  CPAP but  never used     Family History  Problem Relation Age of Onset  . Crohn's disease Brother   . Breast cancer Mother    . Alzheimer's disease Father   . Heart disease Brother   . Hypertension Brother   . Anesthesia problems Neg Hx   . Hypotension Neg Hx   . Malignant hyperthermia Neg Hx   . Pseudochol deficiency Neg Hx     Past Surgical History:  Procedure Laterality Date  . CARDIAC CATHETERIZATION     2010  . CARDIAC CATHETERIZATION N/A 10/22/2015   Procedure: Left Heart Cath  and Coronary Angiography;  Surgeon: Nelva Bush, MD;  Location: [facility];  Service: Cardiovascular;  Laterality: N/A;  . FRACTURE SURGERY     L wrist, hardware- 1982  . JOINT REPLACEMENT     L knee, 2009  . LEFT HEART CATHETERIZATION WITH CORONARY ANGIOGRAM N/A 09/08/2011   Procedure: LEFT HEART CATHETERIZATION WITH CORONARY ANGIOGRAM;  Surgeon: Sinclair Grooms, MD;  Location: [facility] CATH LAB;  Service: Cardiovascular;  Laterality: N/A;  . PROSTATECTOMY     for ca  . TONSILLECTOMY     as child  . TOTAL KNEE ARTHROPLASTY  04/22/2011   Procedure: TOTAL KNEE ARTHROPLASTY;  Surgeon: Garald Balding, MD;  Location: [facility];  Service: Orthopedics;  Laterality: Right;   Social History   Occupational History  . Not on file  Tobacco Use  . Smoking status: Former Smoker    Packs/day: 1.50    Years: 30.00    Pack years: 45.00    Types: Cigarettes    Quit date: 04/11/1983    Years since quitting: 35.5  . Smokeless tobacco: Never Used  Substance and Sexual Activity  . Alcohol use: No    Alcohol/week: 0.0 standard drinks  . Drug use: No  . Sexual activity: Never    Birth control/protection: Condom, None

## 2018-10-06 LAB — URINALYSIS, ROUTINE W REFLEX MICROSCOPIC
Bilirubin Urine: NEGATIVE
Glucose, UA: NEGATIVE
Hgb urine dipstick: NEGATIVE
Ketones, ur: NEGATIVE
Leukocytes,Ua: NEGATIVE
Nitrite: NEGATIVE
Protein, ur: NEGATIVE
Specific Gravity, Urine: 1.007 (ref 1.001–1.03)
pH: 6 (ref 5.0–8.0)

## 2018-10-06 NOTE — Addendum Note (Signed)
Addended by: Lendon Collar on: 10/06/2018 09:22 AM   Modules accepted: Orders

## 2018-10-07 ENCOUNTER — Ambulatory Visit
Admission: RE | Admit: 2018-10-07 | Discharge: 2018-10-07 | Disposition: A | Discharge status: Home / Self Care | Payer: Medicare Other | Source: Ambulatory Visit | Attending: Orthopaedic Surgery | Admitting: Orthopaedic Surgery

## 2018-10-07 DIAGNOSIS — M79672 Pain in left foot: Secondary | ICD-10-CM

## 2018-10-25 ENCOUNTER — Ambulatory Visit: Payer: Self-pay

## 2018-10-25 ENCOUNTER — Ambulatory Visit (INDEPENDENT_AMBULATORY_CARE_PROVIDER_SITE_OTHER): Payer: Medicare Other | Admitting: Physical Medicine and Rehabilitation

## 2018-10-25 ENCOUNTER — Encounter: Payer: Self-pay | Admitting: Physical Medicine and Rehabilitation

## 2018-10-25 VITALS — BP 151/82 | HR 79

## 2018-10-25 DIAGNOSIS — M47816 Spondylosis without myelopathy or radiculopathy, lumbar region: Secondary | ICD-10-CM

## 2018-10-25 DIAGNOSIS — R109 Unspecified abdominal pain: Secondary | ICD-10-CM

## 2018-10-25 DIAGNOSIS — M545 Low back pain, unspecified: Secondary | ICD-10-CM

## 2018-10-25 DIAGNOSIS — G8929 Other chronic pain: Secondary | ICD-10-CM | POA: Diagnosis not present

## 2018-10-25 MED ORDER — METHYLPREDNISOLONE ACETATE 80 MG/ML IJ SUSP
80.0000 mg | Freq: Once | INTRAMUSCULAR | Status: AC
  Administered 2018-10-25: 80 mg

## 2018-10-25 NOTE — Progress Notes (Signed)
 .  Numeric Pain Rating Scale and Functional Assessment Average Pain 6   In the last MONTH (on 0-10 scale) has pain interfered with the following?  1. General activity like being  able to carry out your everyday physical activities such as walking, climbing stairs, carrying groceries, or moving a chair?  Rating(5)   +Driver, +BT(eliquis, ok for inj), -Dye Allergies.

## 2018-10-25 NOTE — Progress Notes (Signed)
[name] - 78 y.o. male MRN [id number]  Date of birth: [birth year]  Office Visit Note: Visit Date: 10/25/2018 PCP: Gaynelle Arabian, MD Referred by: Gaynelle Arabian, MD  Subjective: Chief Complaint  Patient presents with   Lower Back - Pain   HPI:  [name] [name] is a 78 y.o. male who comes in today At the request of Joni Fears, MD for evaluation and management and possible facet joint block for chronic worsening low back pain and right flank pain.  [name] [name] is someone that I saw in the remote past for injections through Dr. Durward Fortes and none of these were successful.  The patient claims that he is not reactive to anesthetic medication.  He tells me that even going to the dentist and having his teeth pulled the anesthetic never worked on him.  He comes in today with an interesting story of having new onset right low back and almost flank pain that radiated somewhat over into the lower abdomen.  He was seen initially by his primary doctor and then ultimately by a urgent care doctor.  Urgent care doctor did see some blood in the urine on a urine test and did obtain x-ray of the lumbar spine looking for possible kidney stone.  None was seen but according to the patient they suggested an MRI.  Patient was seeing Dr. Durward Fortes at the time for his shoulder and his foot.  According to the patient in Dr. Rudene Anda notes there was again blood seen on the urine test in his office.  Dr. Durward Fortes felt like maybe his back pain could be related to facet joint arthritis.  Last MRI was from 2016 showing some reactive arthritis on the right at L4-5 and L5-S1.  There was no appreciable central canal or lateral recess stenosis however.  No focal nerve compression.  Patient has no radicular pain and has not had radicular pain.  There is been no new trauma.  Patient tells me that the low back pain now is worse with standing for a long length of time or walking.  Is better at rest and sitting.  It is  centralized and axial.  Some referral at times to the right more than left.  Interestingly the flank pain that has been bothering him now for several weeks has basically vanished.  He still reports his axial low back pain that is somewhat limiting what he can do.  His case is complicated by morbid obesity.  He has not had physical therapy or chiropractic care in quite some time.  He does not really exercise a spine although he does walk for exercise. Last part of the history is that after the injections we perform many years ago were not beneficial he ended up seeing Dr. Brien Few at [facility] and it sounds like medial branch blocks were performed and a double block paradigm and then radiofrequency ablation was performed.  Interestingly the patient says once again none of these ever helped.  He says they did not help from day 1.  Usually the medial branch blocks need to help in order to get the approval and really the reason you would want to do the ablation is if the medial branch blocks helped.  He emphatically states that they did not help.  We do not have those notes for review.  Review of Systems  Constitutional: Negative for chills, fever, malaise/fatigue and weight loss.  HENT: Negative for hearing loss and sinus pain.   Eyes:  Negative for blurred vision, double vision and photophobia.  Respiratory: Negative for cough and shortness of breath.   Cardiovascular: Negative for chest pain, palpitations and leg swelling.  Gastrointestinal: Negative for abdominal pain, nausea and vomiting.  Genitourinary: Negative for flank pain.  Musculoskeletal: Positive for back pain and joint pain. Negative for myalgias.  Skin: Negative for itching and rash.  Neurological: Negative for tremors, focal weakness and weakness.  Endo/Heme/Allergies: Negative.   Psychiatric/Behavioral: Negative for depression.  All other systems reviewed and are negative.  Otherwise per HPI.  Assessment  & Plan: Visit Diagnoses:  1. Spondylosis without myelopathy or radiculopathy, lumbar region   2. Chronic right-sided low back pain without sciatica   3. Left flank pain     Plan: Findings:  Chronic mechanical low back pain which is been ongoing for many many years which is been recalcitrant to medication previous exercises and therapy as well as all injection treatment.  He has nothing surgical in the lumbar spine.  Last MRI however was in 2016.  We spoke today for almost 30 minutes of his condition.  We had planned on looking at potential facet joint block but apparently the patient is convinced that anesthetics just do not help him and does not feel like it would be very diagnostic.  One good finding is that he has had the flank pain basically relieved at this point.  We talked about sources of flank pain and blood in the urine.  He is following up with Dr. Durward Fortes and I encouraged him to talk to him about that.  Patient has had a history of prostate cancer.  He does have a former smoking history.  In terms of his back pain I think the best course of action at this point given the failure of injections in his believe that the injections to start do show anything would be focused physical therapy at [facility] physical therapy looking at mechanical treatment and dry needling of the lower spine.  This may not be related to the spine at all and may just be a mechanical situation given his weight and prior knee issues.  He has had both knees replaced.  At this point the patient wants to think about therapy and otherwise does not really want an injection or any treatment at this point.    Meds & Orders:  Meds ordered this encounter  Medications   methylPREDNISolone acetate (DEPO-MEDROL) injection 80 mg   No orders of the defined types were placed in this encounter.   Follow-up: Return if symptoms worsen or fail to improve.   Procedures: No procedures performed  No notes on file   Clinical  History: MRI LUMBAR SPINE WITHOUT CONTRAST  TECHNIQUE: Multiplanar, multisequence MR imaging of the lumbar spine was performed. No intravenous contrast was administered.  COMPARISON:  01/05/2010.  FINDINGS: Segmentation: The numbering convention used for this exam termed L5-S1 as the last intervertebral disc space. Numbering used on prior exam preserved.  Alignment: Mild dextroconvex curve of the lumbar spine with the apex at L3.  Vertebrae: Scattered benign vertebral body hemangiomata. Bone marrow signal shows suppression of normal fatty marrow. This is a nonspecific finding most commonly associated with obesity, anemia, cigarette smoking or chronic disease.  Conus medullaris: Normal position at L2.  Paraspinal tissues: Prominent retroperitoneal lymphatics are similar to the prior exam from 2011. Tiny cystic lesion in the LEFT interpolar kidney, likely a cyst.  Disc levels:  Disc Signal: Lower lumbar disc desiccation and degeneration with loss of  height. Vacuum disc at L4-L5.  L1-L2:  Negative.  L2-L3:  Negative.  L3-L4: Shallow broad-based disc bulging. Central canal is patent. Mild narrowing of the LEFT subarticular zone associated with facet hypertrophy and bulging disc, potentially affecting the descending LEFT L4 nerve. Mild bilateral foraminal stenosis secondary to disc bulging.  L4-L5: Disc degeneration with broad-based posterior disc bulging. Moderate RIGHT and mild LEFT foraminal stenosis secondary to disc bulging, mild loss of disc height and endplate osteophytes. Bilateral facet arthrosis is present with RIGHT facet arthritis. Periarticular facet edema is present on the RIGHT.  L5-S1: Disc degeneration and loss of height is chronic and little changed compared to prior exam. Severe RIGHT facet arthrosis with RIGHT facet arthritis. Moderate LEFT facet arthrosis. Central canal and subarticular zones appear adequately patent. Moderate  RIGHT foraminal stenosis associated with facet arthrosis and endplate spurring along with eccentric RIGHT-sided collapse of the disc.  IMPRESSION: 1. 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 2. RIGHT L4-L5 and L5-S1 facet arthritis, potentially accounting for exacerbation of RIGHT-sided low back axial pain. 3. Stenosis is mild by comparison and of questionable significance in this patient without radicular pain. Foraminal stenosis is most pronounced on the RIGHT potentially affecting the RIGHT L3, L4 and L5 nerves.   Electronically Signed   By: Dereck Ligas M.D.   On: 10/24/2014 15:32     Objective:  VS:  HT:     WT:    BMI:      BP:(!) 151/82   HR:79bpm   TEMP: ( )   RESP:  Physical Exam Vitals signs and nursing note reviewed.  Constitutional:      General: He is not in acute distress.    Appearance: He is well-developed. He is obese.  HENT:     Head: Normocephalic and atraumatic.     Nose: Nose normal.     Mouth/Throat:     Mouth: Mucous membranes are moist.     Pharynx: Oropharynx is clear.  Eyes:     Conjunctiva/sclera: Conjunctivae normal.     Pupils: Pupils are equal, round, and reactive to light.  Neck:     Musculoskeletal: Normal range of motion and neck supple.     Trachea: No tracheal deviation.  Cardiovascular:     Rate and Rhythm: Normal rate and regular rhythm.     Pulses: Normal pulses.  Pulmonary:     Effort: Pulmonary effort is normal.     Breath sounds: Normal breath sounds.  Abdominal:     General: There is no distension.     Palpations: Abdomen is soft.     Tenderness: There is no guarding or rebound.  Musculoskeletal:        General: No deformity.     Right lower leg: No edema.     Left lower leg: No edema.     Comments: Patient is very slow to arise from a seated position to full extension does seem to have pain with facet loading and extension.  He has tightness in the paraspinal musculature with deep  palpation but he is very large and thick individual.  He has no pain with hip rotation he has good distal strength without clonus.  Skin:    General: Skin is warm and dry.     Findings: No erythema or rash.  Neurological:     General: No focal deficit present.     Mental Status: He is alert and oriented to person, place, and time.     Motor:  No abnormal muscle tone.     Coordination: Coordination normal.     Gait: Gait normal.  Psychiatric:        Mood and Affect: Mood normal.        Behavior: Behavior normal.        Thought Content: Thought content normal.     Ortho Exam Imaging: No results found.

## 2018-10-26 ENCOUNTER — Encounter: Payer: Self-pay | Admitting: Physical Medicine and Rehabilitation

## 2018-11-04 ENCOUNTER — Ambulatory Visit: Payer: Medicare Other | Admitting: Orthopaedic Surgery

## 2018-11-12 ENCOUNTER — Encounter: Payer: Self-pay | Admitting: Interventional Cardiology

## 2018-12-03 NOTE — Progress Notes (Deleted)
CARDIOLOGY OFFICE NOTE  Date:  12/03/2018    [name] [name] Date of Birth: 06-Apr-1940 Medical Record #[id number]  PCP:  Gaynelle Arabian, MD  Cardiologist:  Zack Seal chief complaint on file.   History of Present Illness: [name] [name] is a 78 y.o. male who presents today for a follow up visit. Seen for Dr. Tamala Julian.   He has a history of PAF, recurring chest pain, HTN and CKD.   In the ER back in January 2020 with chest pain and had negative evaluation. Last seen in May by telehealth visit with Dr. Tamala Julian - endorsing an episode of dizziness - BP 160/80 - he took extra medicine for his BP and felt better.   The patient {does/does not:200015} have symptoms concerning for COVID-19 infection (fever, chills, cough, or new shortness of breath).   Comes in today. Here with   Past Medical History:  Diagnosis Date  . Angina    chest pain- cardiac cath. followed in 2010, record available ,  told then that he should f/u /w Dr. Marisue Humble    . Arthritis    R knee, back    . Asthma    uses  singulair  . Cancer [facility])    prostate, melanoma- 2010, excision    . Chronic kidney disease    prostate cancer - surg. removal- 1996  . Colon polyps   . Diverticulitis   . Dysrhythmia    palpitations, followed by Dr.Ehinger, seen in prep for surgery on 03/27/2011   . GERD (gastroesophageal reflux disease)   . Hepatitis    jaundice- many yrs. ago  . Hiatal hernia   . Hypertension   . Melanoma ([place])    Face  . OSA (obstructive sleep apnea) 04/11/2015   Mild to moderate OSA with AHI 13.3/hr overall and AHI 36.8/hr during REM sleep.  Oxygen saturations were as low as 86% during respiratory events.  . Pneumonia    hosp. 20 yrs. ago  . Prostate cancer ([place]) 1995  . Sleep apnea    study done, 10 yrs. ago, told that he needed  CPAP but  never used     Past Surgical History:  Procedure Laterality Date  . CARDIAC CATHETERIZATION     2010  . CARDIAC CATHETERIZATION N/A 10/22/2015   Procedure: Left Heart Cath and Coronary Angiography;  Surgeon: Nelva Bush, MD;  Location: [facility];  Service: Cardiovascular;  Laterality: N/A;  . FRACTURE SURGERY     L wrist, hardware- 1982  . JOINT REPLACEMENT     L knee, 2009  . LEFT HEART CATHETERIZATION WITH CORONARY ANGIOGRAM N/A 09/08/2011   Procedure: LEFT HEART CATHETERIZATION WITH CORONARY ANGIOGRAM;  Surgeon: Sinclair Grooms, MD;  Location: [facility] CATH LAB;  Service: Cardiovascular;  Laterality: N/A;  . PROSTATECTOMY     for ca  . TONSILLECTOMY     as child  . TOTAL KNEE ARTHROPLASTY  04/22/2011   Procedure: TOTAL KNEE ARTHROPLASTY;  Surgeon: Garald Balding, MD;  Location: [facility];  Service: Orthopedics;  Laterality: Right;     Medications: No outpatient medications have been marked as taking for the 12/06/18 encounter (Appointment) with Burtis Junes, NP.     Allergies: Allergies  Allergen Reactions  . Aminophylline   . Carvedilol     SOB/Fatigue  . Lisinopril Cough  . Tenormin [Atenolol] Other (See Comments)    Fatigue, low HR  . Zolpidem Tartrate Other (See Comments)    hallucinations  Social History: The patient  reports that he quit smoking about 35 years ago. His smoking use included cigarettes. He has a 45.00 pack-year smoking history. He has never used smokeless tobacco. He reports that he does not drink alcohol or use drugs.   Family History: The patient's ***family history includes Alzheimer's disease in his father; Breast cancer in his mother; Crohn's disease in his brother; Heart disease in his brother; Hypertension in his brother.   Review of Systems: Please see the history of present illness.   All other systems are reviewed and negative.   Physical Exam: VS:  There were no vitals taken for this visit. Marland Kitchen  BMI There is no height or weight on file to calculate BMI.  Wt Readings from Last 3 Encounters:  10/05/18 245 lb (111.1 kg)  08/12/18 245 lb (111.1 kg)  07/14/18 245 lb (111.1  kg)    General: Pleasant. Well developed, well nourished and in no acute distress.   HEENT: Normal.  Neck: Supple, no JVD, carotid bruits, or masses noted.  Cardiac: ***Regular rate and rhythm. No murmurs, rubs, or gallops. No edema.  Respiratory:  Lungs are clear to auscultation bilaterally with normal work of breathing.  GI: Soft and nontender.  MS: No deformity or atrophy. Gait and ROM intact.  Skin: Warm and dry. Color is normal.  Neuro:  Strength and sensation are intact and no gross focal deficits noted.  Psych: Alert, appropriate and with normal affect.   LABORATORY DATA:  EKG:  EKG {ACTION; IS/IS [id number]} ordered today. This demonstrates ***.  Lab Results  Component Value Date   WBC 10.7 (H) 03/27/2018   HGB 14.5 03/27/2018   HCT 44.0 03/27/2018   PLT 176 03/27/2018   GLUCOSE 102 (H) 03/27/2018   CHOL 182 02/28/2017   TRIG 52 02/28/2017   HDL 52 02/28/2017   LDLCALC 120 (H) 02/28/2017   ALT 11 (L) 01/26/2015   AST 18 01/26/2015   NA 140 03/27/2018   K 4.0 03/27/2018   CL 102 03/27/2018   CREATININE 1.26 (H) 03/27/2018   BUN 18 03/27/2018   CO2 29 03/27/2018   TSH 2.270 01/26/2015   INR 1.1 10/17/2015   HGBA1C 5.5 02/28/2017       BNP (last 3 results) No results for input(s): BNP in the last 8760 hours.  ProBNP (last 3 results) No results for input(s): PROBNP in the last 8760 hours.   Other Studies Reviewed Today:  Echo Study Conclusions 02/2017  - Left ventricle: The cavity size was normal. Wall thickness was   increased in a pattern of moderate LVH. Systolic function was   normal. The estimated ejection fraction was in the range of 60%   to 65%. Wall motion was normal; there were no regional wall   motion abnormalities. Doppler parameters are consistent with   abnormal left ventricular relaxation (grade 1 diastolic   dysfunction). - Pulmonary arteries: Systolic pressure was mildly increased.  Impressions:  - Vigorous LV systolic  function; mild diastolic dysfunction;   moderate LVH; trace TR with mildly elevated pulmonary pressure.   Left Heart Cath and Coronary Angiography 10/2015  Conclusion  1.  Mild luminal irregularities involving the RCA and LAD, similar to prior catheterizations.  No obstructive coronary artery disease. 2.  Mildly to moderately elevated left ventricular filling pressure (23 mmHg). 3.  Normal left ventricular contraction.  Plan: 1.  Continue medical management and primary prevention.      Assessment/Plan:  1. PAF - on Flecainide -  2. Chronic anticoagulation  3. HTN  4. OSA  5. CKD  6. 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 OSA  8. HLD - would favor statin therapy    9. COVID-19 Education: The signs and symptoms of COVID-19 were discussed with the patient and how to seek care for testing (follow up with PCP or arrange E-visit).  The importance of social distancing, staying at home, hand hygiene and wearing a mask when out in public were discussed today.  Current medicines are reviewed with the patient today.  The patient does not have concerns regarding medicines other than what has been noted above.  The following changes have been made:  See above.  Labs/ tests ordered today include:   No orders of the defined types were placed in this encounter.    Disposition:   FU with *** in {gen number [id number]} {Days to years:10300}.   Patient is agreeable to this plan and will call if any problems develop in the interim.   SignedTruitt Merle, NP  12/03/2018 8:15 AM  [facility] [address] [place], [place]  [address] Phone: [phone] Fax: [phone]

## 2018-12-06 ENCOUNTER — Ambulatory Visit: Payer: Medicare Other | Admitting: Nurse Practitioner

## 2018-12-29 NOTE — Progress Notes (Signed)
CARDIOLOGY OFFICE NOTE  Date:  01/03/2019    [name] [name] Date of Birth: 1940-08-17 Medical Record #[id number]  PCP:  Gaynelle Arabian, MD  Cardiologist:  Tamala Julian  Chief Complaint  Patient presents with  . Follow-up    1 year check - seen for Dr. Tamala Julian    History of Present Illness: [name] [name] is a 78 y.o. male who presents today for a 5 month check. Seen for Dr. Tamala Julian.   He has a history of PAF - on Flecainide, chronic anticoagulation with Eliquis, CAD per cath in 2017 with non obstructive disease, OSA, HTN, and CKD. 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   Last seen by Dr. Tamala Julian in May with a telehealth visit. He had had a sudden onset of dizziness the night prior - BP was up - took extra half of ARB and multiple BP checks. Was fine at the time of the visit. Noted that he had been in the ER back in January - markers were negative. He had had no recurrence. Dr. Tamala Julian was concerned this was perhaps a spell of AF. Low dose statin was added to his regimen given known atherosclerosis.    The patient does not have symptoms concerning for COVID-19 infection (fever, chills, cough, or new shortness of breath).   Comes in today. Here alone. Has had COVID - along with his wife. Did not require hospitalization. This was about a month ago. Sounds like he was pretty sick for about 5 days. He was not given any treatment. Took Mucinex and Tylenol.  Feeling much better now. He does not know how he got it.  Lost part of his smell and taste - this has improved. He had chest pain and shortness of breath with the virus. This is improved overall. Some DOE if he really exerts himself. But notes he is pretty well "over it" now and feeling much better. He is going to see pulmonary in [facility] on Friday this week for evaluation - looks to have an enlarging right upper lobe groundglass nodule - concerning for slow growing neoplasm.   Past Medical History:   Diagnosis Date  . Angina    chest pain- cardiac cath. followed in 2010, record available ,  told then that he should f/u /w Dr. Marisue Humble    . Arthritis    R knee, back    . Asthma    uses  singulair  . Cancer [facility])    prostate, melanoma- 2010, excision    . Chronic kidney disease    prostate cancer - surg. removal- 1996  . Colon polyps   . Diverticulitis   . Dysrhythmia    palpitations, followed by Dr.Ehinger, seen in prep for surgery on 03/27/2011   . GERD (gastroesophageal reflux disease)   . Hepatitis    jaundice- many yrs. ago  . Hiatal hernia   . Hypertension   . 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 OSA (obstructive sleep apnea) 04/11/2015   Mild to moderate OSA with AHI 13.3/hr overall and AHI 36.8/hr during REM sleep.  Oxygen saturations were as low as 86% during respiratory events.  . Pneumonia    hosp. 20 yrs. ago  . Prostate cancer ([place]) 1995  . Sleep apnea    study done, 10 yrs. ago, told that he needed  CPAP but  never used     Past Surgical History:  Procedure Laterality Date  . CARDIAC CATHETERIZATION  2010  . CARDIAC CATHETERIZATION N/A 10/22/2015   Procedure: Left Heart Cath and Coronary Angiography;  Surgeon: Nelva Bush, MD;  Location: [place] CV LAB;  Service: Cardiovascular;  Laterality: N/A;  . FRACTURE SURGERY     L wrist, hardware- 1982  . JOINT REPLACEMENT     L knee, 2009  . LEFT HEART CATHETERIZATION WITH CORONARY ANGIOGRAM N/A 09/08/2011   Procedure: LEFT HEART CATHETERIZATION WITH CORONARY ANGIOGRAM;  Surgeon: Sinclair Grooms, MD;  Location: [facility] CATH LAB;  Service: Cardiovascular;  Laterality: N/A;  . PROSTATECTOMY     for ca  . TONSILLECTOMY     as child  . TOTAL KNEE ARTHROPLASTY  04/22/2011   Procedure: TOTAL KNEE ARTHROPLASTY;  Surgeon: Garald Balding, MD;  Location: [place];  Service: Orthopedics;  Laterality: Right;     Medications: Current Meds  Medication Sig  . albuterol (PROVENTIL HFA;VENTOLIN HFA) 108 (90 BASE) MCG/ACT inhaler  Inhale 2 puffs into the lungs every 6 (six) hours as needed for wheezing or shortness of breath.  . budesonide-formoterol (SYMBICORT) 160-4.5 MCG/ACT inhaler Inhale 2 puffs into the lungs 2 (two) times daily.  . Calcium Carbonate-Vitamin D (CALCIUM + D) 600-200 MG-UNIT TABS Take 1 tablet by mouth 2 (two) times daily.  Marland Kitchen ELIQUIS 5 MG TABS tablet TAKE 1 TABLET BY MOUTH TWO  TIMES DAILY  . flecainide (TAMBOCOR) 50 MG tablet TAKE 1 TABLET BY MOUTH TWO  TIMES DAILY  . furosemide (LASIX) 20 MG tablet Take 20 mg by mouth daily.  Marland Kitchen HYDROcodone-acetaminophen (NORCO/VICODIN) 5-325 MG tablet Take 1 tablet by mouth every 4 (four) hours as needed for moderate pain.  . montelukast (SINGULAIR) 10 MG tablet Take 10 mg by mouth daily before breakfast.   . Multiple Vitamins tablet Take 1 tablet by mouth daily.  . nitroGLYCERIN (NITROSTAT) 0.4 MG SL tablet Place 1 tablet (0.4 mg total) under the tongue every 5 (five) minutes x 3 doses as needed for chest pain.  . Omega-3 Fatty Acids (FISH OIL) 1000 MG CAPS Take 1,000 mg by mouth 2 (two) times daily.  Marland Kitchen omeprazole (PRILOSEC) 40 MG capsule Take 40 mg by mouth at bedtime.   . predniSONE (DELTASONE) 50 MG tablet One tablet a day  . telmisartan (MICARDIS) 80 MG tablet TAKE 1 TABLET BY MOUTH  DAILY  . vitamin E 1000 UNIT capsule Take 1,000 Units by mouth daily.     Allergies: Allergies  Allergen Reactions  . Aminophylline   . Carvedilol     SOB/Fatigue  . Lisinopril Cough  . Tenormin [Atenolol] Other (See Comments)    Fatigue, low HR  . Zolpidem Tartrate Other (See Comments)    hallucinations    Social History: The patient  reports that he quit smoking about 35 years ago. His smoking use included cigarettes. He has a 45.00 pack-year smoking history. He has never used smokeless tobacco. He reports that he does not drink alcohol or use drugs.   Family History: The patient's family history includes Alzheimer's disease in his father; Breast cancer in his  mother; Crohn's disease in his brother; Heart disease in his brother; Hypertension in his brother.   Review of Systems: Please see the history of present illness.   All other systems are reviewed and negative.   Physical Exam: VS:  Ht 5\' 7"  (1.702 m)   Wt 250 lb 1.9 oz (113.5 kg)   BMI 39.17 kg/m  .  BMI Body mass index is 39.17 kg/m.  Wt Readings from  Last 3 Encounters:  01/03/19 250 lb 1.9 oz (113.5 kg)  10/05/18 245 lb (111.1 kg)  08/12/18 245 lb (111.1 kg)    General: Pleasant. Obese. Alert and in no acute distress. Still with congested cough.  HEENT: Normal.  Neck: Supple, no JVD, carotid bruits, or masses noted.  Cardiac: Heart tones are distant. No edema.  Respiratory:  Decreased breath sounds noted with normal work of breathing.  GI: Soft and nontender.  MS: No deformity or atrophy. Gait and ROM intact.  Skin: Warm and dry. Color is normal.  Neuro:  Strength and sensation are intact and no gross focal deficits noted.  Psych: Alert, appropriate and with normal affect.   LABORATORY DATA:  EKG:  EKG is ordered today. This demonstrates NSR.  Lab Results  Component Value Date   WBC 10.7 (H) 03/27/2018   HGB 14.5 03/27/2018   HCT 44.0 03/27/2018   PLT 176 03/27/2018   GLUCOSE 102 (H) 03/27/2018   CHOL 182 02/28/2017   TRIG 52 02/28/2017   HDL 52 02/28/2017   LDLCALC 120 (H) 02/28/2017   ALT 11 (L) 01/26/2015   AST 18 01/26/2015   NA 140 03/27/2018   K 4.0 03/27/2018   CL 102 03/27/2018   CREATININE 1.26 (H) 03/27/2018   BUN 18 03/27/2018   CO2 29 03/27/2018   TSH 2.270 01/26/2015   INR 1.1 10/17/2015   HGBA1C 5.5 02/28/2017        BNP (last 3 results) No results for input(s): BNP in the last 8760 hours.  ProBNP (last 3 results) No results for input(s): PROBNP in the last 8760 hours.   Other Studies Reviewed Today:  Echo Study Conclusions 2018  - Left ventricle: The cavity size was normal. Wall thickness was   increased in a pattern of  moderate LVH. Systolic function was   normal. The estimated ejection fraction was in the range of 60%   to 65%. Wall motion was normal; there were no regional wall   motion abnormalities. Doppler parameters are consistent with   abnormal left ventricular relaxation (grade 1 diastolic   dysfunction). - Pulmonary arteries: Systolic pressure was mildly increased.  Impressions:  - Vigorous LV systolic function; mild diastolic dysfunction;   moderate LVH; trace TR with mildly elevated pulmonary pressure.    Left Heart Cath and Coronary Angiography 2017  Conclusion  1.  Mild luminal irregularities involving the RCA and LAD, similar to prior catheterizations.  No obstructive coronary artery disease. 2.  Mildly to moderately elevated left ventricular filling pressure (23 mmHg). 3.  Normal left ventricular contraction.  Plan: 1.  Continue medical management and primary prevention.      Assessment/Plan:  1. PAF - remains in NSR - on Flecainide. Remains on anticoagulation - labs noted.   2. Non obstructive CAD - some chest pain while he had COVID - this is improved.   3. DOE - noted this with COVID - reports this is improving - seeing pulmonary Friday in [facility] for lung nodule that is concerning. We will see back in a few weeks. Need to consider echo if fails to improve.   4. HTN - BP fair.   5. HLD - labs noted. Statin was recommended at last visit - will readdress with patient on return.   6. Chronic anticoagulation - no problems noted.   7. Obesity  8. CKD  9. COVID-19 Education: The signs and symptoms of COVID-19 were discussed with the patient and how to seek care for  testing (follow up with PCP or arrange E-visit).  The importance of social distancing, staying at home, hand hygiene and wearing a mask when out in public were discussed today.  Current medicines are reviewed with the patient today.  The patient does not have concerns regarding medicines other than  what has been noted above.  The following changes have been made:  See above.  Labs/ tests ordered today include:    Orders Placed This Encounter  Procedures  . EKG 12-Lead     Disposition:   FU with me in about 6 weeks.    Patient is agreeable to this plan and will call if any problems develop in the interim.   SignedTruitt Merle, NP  01/03/2019 4:40 PM  [place] [address] [place], [name]  [address] Phone: [phone] Fax: [phone]

## 2019-01-03 ENCOUNTER — Encounter: Payer: Self-pay | Admitting: Nurse Practitioner

## 2019-01-03 ENCOUNTER — Other Ambulatory Visit: Payer: Self-pay

## 2019-01-03 ENCOUNTER — Ambulatory Visit (INDEPENDENT_AMBULATORY_CARE_PROVIDER_SITE_OTHER): Payer: Medicare Other | Admitting: Nurse Practitioner

## 2019-01-03 VITALS — Ht 67.0 in | Wt 250.1 lb

## 2019-01-03 DIAGNOSIS — I480 Paroxysmal atrial fibrillation: Secondary | ICD-10-CM

## 2019-01-03 DIAGNOSIS — I10 Essential (primary) hypertension: Secondary | ICD-10-CM | POA: Diagnosis not present

## 2019-01-03 DIAGNOSIS — Z79899 Other long term (current) drug therapy: Secondary | ICD-10-CM

## 2019-01-03 DIAGNOSIS — I259 Chronic ischemic heart disease, unspecified: Secondary | ICD-10-CM | POA: Diagnosis not present

## 2019-01-03 DIAGNOSIS — Z7901 Long term (current) use of anticoagulants: Secondary | ICD-10-CM | POA: Diagnosis not present

## 2019-01-03 NOTE — Patient Instructions (Addendum)
After Visit Summary:  We will be checking the following labs today - NONE   Medication Instructions:    Continue with your current medicines.   We will see if we have samples of Eliquis   If you need a refill on your cardiac medications before your next appointment, please call your pharmacy.     Testing/Procedures To Be Arranged:  N/A  Follow-Up:   See me in 6 weeks - we will see how you are doing - we may have to do further testing.     At [facility], you and your health needs are our priority.  As part of our continuing mission to provide you with exceptional heart care, we have created designated Provider Care Teams.  These Care Teams include your primary Cardiologist (physician) and Advanced Practice Providers (APPs -  Physician Assistants and Nurse Practitioners) who all work together to provide you with the care you need, when you need it.  Special Instructions:  . Stay safe, stay home, wash your hands for at least 20 seconds and wear a mask when out in public.  . It was good to talk with you today.     Call the [place] office at [phone] if you have any questions, problems or concerns.

## 2019-01-07 ENCOUNTER — Telehealth: Payer: Self-pay

## 2019-01-07 NOTE — Telephone Encounter (Signed)
Patients wife Fredderick Severance called, she needed samples for Eliquis, she is waiting on mailorder prescription to come. Samples left downstairs for patient pick up.

## 2019-01-18 DIAGNOSIS — C3411 Malignant neoplasm of upper lobe, right bronchus or lung: Secondary | ICD-10-CM | POA: Insufficient documentation

## 2019-02-07 ENCOUNTER — Ambulatory Visit: Payer: Medicare Other | Admitting: Nurse Practitioner

## 2019-02-10 NOTE — Progress Notes (Signed)
CARDIOLOGY OFFICE NOTE  Date:  02/15/2019    [name] Date of Birth: August 22, 1940 Medical Record #[id number]  PCP:  Gaynelle Arabian, MD   Cardiologist:  Jennings Books  Chief Complaint  Patient presents with  . Follow-up    Seen for Dr. Tamala Julian    History of Present Illness: [name] [name] is a 78 y.o. male who presents today for a 6 week check. Seen for Dr. Tamala Julian.   He has a history of PAF - on Flecainide, chronic anticoagulation with Eliquis, CAD per cath in 2017 with non obstructive disease, OSA, HTN, and CKD. 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   Last seen by Dr. Tamala Julian in May with a telehealth visit. He had had a sudden onset of dizziness the night prior - BP was up - took extra half of ARB and multiple BP checks. Was fine at the time of the visit. Noted that he had been in the ER back in January - markers were negative. He had had no recurrence. Dr. Tamala Julian was concerned this was perhaps a spell of AF. Low dose statin was added to his regimen given known atherosclerosis.    I saw him in the office back in October - he had had COVID - had been pretty sick - did not require hospitalization - he did recover -  was getting ready to have visit with pulmonary due to enlarging right upper lobe groundglass nodule- concerning for slow growing neoplasm.   The patient does not have symptoms concerning for COVID-19 infection (fever, chills, cough, or new shortness of breath).   Comes in today. Here alone. He does in fact have lung cancer - he was to start therapy yesterday (radiation) but could not lie on the table due to his severe back pain - going back tomorrow and will have some pain medicine to see if they can complete his session. Still with some shortness of breath - he thinks this is improving and better than at last visit. He has not had anymore chest pain.   Past Medical History:  Diagnosis Date  . Angina    chest pain- cardiac  cath. followed in 2010, record available ,  told then that he should f/u /w Dr. Marisue Humble    . Arthritis    R knee, back    . Asthma    uses  singulair  . Cancer [facility])    prostate, melanoma- 2010, excision    . Chronic kidney disease    prostate cancer - surg. removal- 1996  . Colon polyps   . Diverticulitis   . Dysrhythmia    palpitations, followed by Dr.Ehinger, seen in prep for surgery on 03/27/2011   . GERD (gastroesophageal reflux disease)   . Hepatitis    jaundice- many yrs. ago  . Hiatal hernia   . Hypertension   . 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 OSA (obstructive sleep apnea) 04/11/2015   Mild to moderate OSA with AHI 13.3/hr overall and AHI 36.8/hr during REM sleep.  Oxygen saturations were as low as 86% during respiratory events.  . Pneumonia    hosp. 20 yrs. ago  . Prostate cancer ([place]) 1995  . Sleep apnea    study done, 10 yrs. ago, told that he needed  CPAP but  never used     Past Surgical History:  Procedure Laterality Date  . CARDIAC CATHETERIZATION     2010  .  CARDIAC CATHETERIZATION N/A 10/22/2015   Procedure: Left Heart Cath and Coronary Angiography;  Surgeon: Nelva Bush, MD;  Location: [place] CV LAB;  Service: Cardiovascular;  Laterality: N/A;  . FRACTURE SURGERY     L wrist, hardware- 1982  . JOINT REPLACEMENT     L knee, 2009  . LEFT HEART CATHETERIZATION WITH CORONARY ANGIOGRAM N/A 09/08/2011   Procedure: LEFT HEART CATHETERIZATION WITH CORONARY ANGIOGRAM;  Surgeon: Sinclair Grooms, MD;  Location: [facility] CATH LAB;  Service: Cardiovascular;  Laterality: N/A;  . PROSTATECTOMY     for ca  . TONSILLECTOMY     as child  . TOTAL KNEE ARTHROPLASTY  04/22/2011   Procedure: TOTAL KNEE ARTHROPLASTY;  Surgeon: Garald Balding, MD;  Location: [place];  Service: Orthopedics;  Laterality: Right;     Medications: Current Meds  Medication Sig  . albuterol (PROVENTIL HFA;VENTOLIN HFA) 108 (90 BASE) MCG/ACT inhaler Inhale 2 puffs into the lungs every 6 (six) hours as  needed for wheezing or shortness of breath.  . budesonide-formoterol (SYMBICORT) 160-4.5 MCG/ACT inhaler Inhale 2 puffs into the lungs 2 (two) times daily.  . Calcium Carbonate-Vitamin D (CALCIUM + D) 600-200 MG-UNIT TABS Take 1 tablet by mouth 2 (two) times daily.  Marland Kitchen ELIQUIS 5 MG TABS tablet TAKE 1 TABLET BY MOUTH TWO  TIMES DAILY  . flecainide (TAMBOCOR) 50 MG tablet TAKE 1 TABLET BY MOUTH TWO  TIMES DAILY  . furosemide (LASIX) 20 MG tablet Take 20 mg by mouth daily.  Marland Kitchen HYDROcodone-acetaminophen (NORCO/VICODIN) 5-325 MG tablet Take 1 tablet by mouth every 4 (four) hours as needed for moderate pain.  . montelukast (SINGULAIR) 10 MG tablet Take 10 mg by mouth daily before breakfast.   . Multiple Vitamins tablet Take 1 tablet by mouth daily.  . nitroGLYCERIN (NITROSTAT) 0.4 MG SL tablet Place 1 tablet (0.4 mg total) under the tongue every 5 (five) minutes x 3 doses as needed for chest pain.  . Omega-3 Fatty Acids (FISH OIL) 1000 MG CAPS Take 1,000 mg by mouth 2 (two) times daily.  Marland Kitchen omeprazole (PRILOSEC) 40 MG capsule Take 40 mg by mouth at bedtime.   . predniSONE (DELTASONE) 50 MG tablet One tablet a day  . telmisartan (MICARDIS) 80 MG tablet TAKE 1 TABLET BY MOUTH  DAILY  . vitamin E 1000 UNIT capsule Take 1,000 Units by mouth daily.     Allergies: Allergies  Allergen Reactions  . Aminophylline   . Carvedilol     SOB/Fatigue  . Lisinopril Cough  . Tenormin [Atenolol] Other (See Comments)    Fatigue, low HR  . Zolpidem Tartrate Other (See Comments)    hallucinations    Social History: The patient  reports that he quit smoking about 35 years ago. His smoking use included cigarettes. He has a 45.00 pack-year smoking history. He has never used smokeless tobacco. He reports that he does not drink alcohol or use drugs.   Family History: The patient's family history includes Alzheimer's disease in his father; Breast cancer in his mother; Crohn's disease in his brother; Heart disease in  his brother; Hypertension in his brother.   Review of Systems: Please see the history of present illness.   All other systems are reviewed and negative.   Physical Exam: VS:  BP (!) 150/70 (BP Location: Left Arm, Patient Position: Sitting, Cuff Size: Large)   Pulse 79   Ht 5\' 7"  (1.702 m)   Wt 255 lb (115.7 kg)   SpO2 96%  BMI 39.94 kg/m  .  BMI Body mass index is 39.94 kg/m.  Wt Readings from Last 3 Encounters:  02/15/19 255 lb (115.7 kg)  01/03/19 250 lb 1.9 oz (113.5 kg)  10/05/18 245 lb (111.1 kg)   BP is 130/70 by me with a large cuff.   General: Elderly. Alert and in no acute distress.  He is obese.  HEENT: Normal.  Neck: Supple, no JVD, carotid bruits, or masses noted.  Cardiac: Regular rate and rhythm. No murmurs, rubs, or gallops. No edema.  Respiratory:  Lungs are clear to auscultation bilaterally with normal work of breathing.  GI: Soft and nontender.  MS: No deformity or atrophy. Gait and ROM intact.  Skin: Warm and dry. Color is normal.  Neuro:  Strength and sensation are intact and no gross focal deficits noted.  Psych: Alert, appropriate and with normal affect.   LABORATORY DATA:  EKG:  EKG is ordered today. This demonstrates NSR. HR is 79.   Lab Results  Component Value Date   WBC 10.7 (H) 03/27/2018   HGB 14.5 03/27/2018   HCT 44.0 03/27/2018   PLT 176 03/27/2018   GLUCOSE 102 (H) 03/27/2018   CHOL 182 02/28/2017   TRIG 52 02/28/2017   HDL 52 02/28/2017   LDLCALC 120 (H) 02/28/2017   ALT 11 (L) 01/26/2015   AST 18 01/26/2015   NA 140 03/27/2018   K 4.0 03/27/2018   CL 102 03/27/2018   CREATININE 1.26 (H) 03/27/2018   BUN 18 03/27/2018   CO2 29 03/27/2018   TSH 2.270 01/26/2015   INR 1.1 10/17/2015   HGBA1C 5.5 02/28/2017        BNP (last 3 results) No results for input(s): BNP in the last 8760 hours.  ProBNP (last 3 results) No results for input(s): PROBNP in the last 8760 hours.   Other Studies Reviewed Today:  Echo Study  Conclusions 2018  - Left ventricle: The cavity size was normal. Wall thickness was increased in a pattern of moderate LVH. Systolic function was normal. The estimated ejection fraction was in the range of 60% to 65%. Wall motion was normal; there were no regional wall motion abnormalities. Doppler parameters are consistent with abnormal left ventricular relaxation (grade 1 diastolic dysfunction). - Pulmonary arteries: Systolic pressure was mildly increased.  Impressions:  - Vigorous LV systolic function; mild diastolic dysfunction; moderate LVH; trace TR with mildly elevated pulmonary pressure.    Left Heart Cath and Coronary Angiography 2017  Conclusion  1. Mild luminal irregularities involving the RCA and LAD, similar to prior catheterizations. No obstructive coronary artery disease. 2. Mildly to moderately elevated left ventricular filling pressure (23 mmHg). 3. Normal left ventricular contraction.  Plan: 1. Continue medical management and primary prevention.      Assessment/Plan:  1. PAF - remains in NSR on Flecainide - EKG ok. Remains on anticoagulation.   2. Non obstructive CAD - no active chest pain - he did have some while he had COVID - this is now resolved.   3. Chronic anticoagulation - we were able to help with samples of Eliquis today.   4. DOE - he feels this is improving.   5. Newly diagnosed lung cancer - he is to have radiation - has been impacted by his back pain - going to retry with pain medicine tomorrow.   6. HTN - BP is ok by me. No changes made today.   7. HLD - labs noted from his PCP from September - statin  was recommended - he is not really wanting anything else at this time - wants to address on return.   8. Obesity  9. CKD  10. COVID-19 Education: The signs and symptoms of COVID-19 were discussed with the patient and how to seek care for testing (follow up with PCP or arrange E-visit).  The importance of  social distancing, staying at home, hand hygiene and wearing a mask when out in public were discussed today.  Current medicines are reviewed with the patient today.  The patient does not have concerns regarding medicines other than what has been noted above.  The following changes have been made:  See above.  Labs/ tests ordered today include:    Orders Placed This Encounter  Procedures  . EKG 12-Lead     Disposition:   FU with me in about 4 months.    Patient is agreeable to this plan and will call if any problems develop in the interim.   SignedTruitt Merle, NP  02/15/2019 2:57 PM  [place] [address] [place], [place]  [address] Phone: [phone] Fax: [phone]

## 2019-02-15 ENCOUNTER — Encounter: Payer: Self-pay | Admitting: Nurse Practitioner

## 2019-02-15 ENCOUNTER — Ambulatory Visit (INDEPENDENT_AMBULATORY_CARE_PROVIDER_SITE_OTHER): Payer: Medicare Other | Admitting: Nurse Practitioner

## 2019-02-15 ENCOUNTER — Other Ambulatory Visit: Payer: Self-pay

## 2019-02-15 VITALS — BP 150/70 | HR 79 | Ht 67.0 in | Wt 255.0 lb

## 2019-02-15 DIAGNOSIS — I480 Paroxysmal atrial fibrillation: Secondary | ICD-10-CM

## 2019-02-15 DIAGNOSIS — U071 COVID-19: Secondary | ICD-10-CM

## 2019-02-15 DIAGNOSIS — Z79899 Other long term (current) drug therapy: Secondary | ICD-10-CM

## 2019-02-15 DIAGNOSIS — Z7901 Long term (current) use of anticoagulants: Secondary | ICD-10-CM | POA: Diagnosis not present

## 2019-02-15 DIAGNOSIS — I259 Chronic ischemic heart disease, unspecified: Secondary | ICD-10-CM | POA: Diagnosis not present

## 2019-02-15 DIAGNOSIS — I10 Essential (primary) hypertension: Secondary | ICD-10-CM

## 2019-02-15 NOTE — Patient Instructions (Signed)
After Visit Summary:  We will be checking the following labs today - NONE   Medication Instructions:    Continue with your current medicines.    If you need a refill on your cardiac medications before your next appointment, please call your pharmacy.     Testing/Procedures To Be Arranged:  N/A  Follow-Up:   See me in about 4 months.     At [facility], you and your health needs are our priority.  As part of our continuing mission to provide you with exceptional heart care, we have created designated Provider Care Teams.  These Care Teams include your primary Cardiologist (physician) and Advanced Practice Providers (APPs -  Physician Assistants and Nurse Practitioners) who all work together to provide you with the care you need, when you need it.  Special Instructions:  . Stay safe, stay home, wash your hands for at least 20 seconds and wear a mask when out in public.  . It was good to talk with you today.    Call the [place] office at [phone] if you have any questions, problems or concerns.

## 2019-02-24 ENCOUNTER — Other Ambulatory Visit: Payer: Self-pay | Admitting: Interventional Cardiology

## 2019-02-24 NOTE — Telephone Encounter (Signed)
Eliquis 5mg  refill request received, pt is 78 yrs old, weight-115.7kg, Crea-1.20 on 11/12/2018 via scanned labs from Dr. Andrew Au office, Diagnosis-Afib, and last seen by Truitt Merle on 02/15/2019. Dose is appropriate based on dosing criteria. Will send in refill to requested pharmacy.

## 2019-06-08 NOTE — Progress Notes (Signed)
CARDIOLOGY OFFICE NOTE  Date:  06/14/2019    [name] [name] Date of Birth: [birth year] Medical Record #[id number]  PCP:  Gaynelle Arabian, MD  Cardiologist:  Jennings Books  Chief Complaint  Patient presents with  . Follow-up    Seen for Dr. Tamala Julian    History of Present Illness: [name] [name] is a 79 y.o. male who presents today for a follow up visit. Seen for Dr. Tamala Julian.   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  Last seen by Dr. Tamala Julian in May 2020 with a telehealth visit. He had had a sudden onset of dizziness the night prior - BP was up - took extra half of ARB and multiple BP checks. Was fine at the time of the visit. Noted that he had been in the ER back in January 2020 - markers were negative. He had had no recurrence. Dr. Tamala Julian was concerned this was perhaps a spell of AF. Low dose statin was added to his regimen given known atherosclerosis.I then saw him back in October - he had had COVID - noted enlarging right upper lobe ground glass nodule - concerning for slow growing neoplasm. Last seen by me in December - did in fact have lung cancer - attempted radiation - but could not lie on the table initially.   The patient does not have symptoms concerning for COVID-19 infection (fever, chills, cough, or new shortness of breath).   Comes in today. Here alone. He as several issues. Does not feel well. Feels like he "can't make any progress".  He completed his 4 rounds of XRT. He remains short of breath - he attributes to having COVID and XRT. Masks make his breathing worse as well. At night he has noticed some chest discomfort - says it has been there for a while - but worse since he had COVID. He notes the pain is sharp - sounds fleeting - then goes away. Will have mostly at night about 3 to 4  times a week. Definitely gets his attention. He remains weak and continues with DOE - he does not see this improving at all. He is seeing pulmonary thru [facility] in [place]. Has upcoming scans later this month as well. He notes the cost of Eliquis is pretty high. He does not qualify for any assistance. He is not having any AF that he is aware of. Frustrated that he can't lose weight despite cutting back on bread/calories. He is worried that COVID has affected his heart. He has been vaccinated.   Past Medical History:  Diagnosis Date  . Angina    chest pain- cardiac cath. followed in 2010, record available ,  told then that he should f/u /w Dr. Marisue Humble    . Arthritis    R knee, back    . Asthma    uses  singulair  . Cancer [facility])    prostate, melanoma- 2010, excision    . Chronic kidney disease    prostate cancer - surg. removal- 1996  . Colon polyps   . Diverticulitis   . Dysrhythmia    palpitations, followed by Dr.Ehinger, seen in prep for surgery on 03/27/2011   . GERD (gastroesophageal reflux disease)   . Hepatitis    jaundice- many yrs. ago  . Hiatal hernia   . Hypertension   .  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 OSA (obstructive sleep apnea) 04/11/2015   Mild to moderate OSA with AHI 13.3/hr overall and AHI 36.8/hr during REM sleep.  Oxygen saturations were as low as 86% during respiratory events.  . Pneumonia    hosp. 20 yrs. ago  . Prostate cancer ([place]) 1995  . Sleep apnea    study done, 10 yrs. ago, told that he needed  CPAP but  never used     Past Surgical History:  Procedure Laterality Date  . CARDIAC CATHETERIZATION     2010  . CARDIAC CATHETERIZATION N/A 10/22/2015   Procedure: Left Heart Cath and Coronary Angiography;  Surgeon: Nelva Bush, MD;  Location: [place] CV LAB;  Service: Cardiovascular;  Laterality: N/A;  . FRACTURE SURGERY     L wrist, hardware- 1982  . JOINT REPLACEMENT     L knee, 2009  . LEFT HEART CATHETERIZATION WITH CORONARY ANGIOGRAM N/A 09/08/2011    Procedure: LEFT HEART CATHETERIZATION WITH CORONARY ANGIOGRAM;  Surgeon: Sinclair Grooms, MD;  Location: [facility] CATH LAB;  Service: Cardiovascular;  Laterality: N/A;  . PROSTATECTOMY     for ca  . TONSILLECTOMY     as child  . TOTAL KNEE ARTHROPLASTY  04/22/2011   Procedure: TOTAL KNEE ARTHROPLASTY;  Surgeon: Garald Balding, MD;  Location: [facility];  Service: Orthopedics;  Laterality: Right;     Medications: Current Meds  Medication Sig  . albuterol (PROVENTIL HFA;VENTOLIN HFA) 108 (90 BASE) MCG/ACT inhaler Inhale 2 puffs into the lungs every 6 (six) hours as needed for wheezing or shortness of breath.  . budesonide-formoterol (SYMBICORT) 160-4.5 MCG/ACT inhaler Inhale 2 puffs into the lungs 2 (two) times daily.  . Calcium Carbonate-Vitamin D (CALCIUM + D) 600-200 MG-UNIT TABS Take 1 tablet by mouth 2 (two) times daily.  Marland Kitchen diltiazem (TIAZAC) 120 MG 24 hr capsule daily.  Marland Kitchen ELIQUIS 5 MG TABS tablet TAKE 1 TABLET BY MOUTH TWO  TIMES DAILY  . flecainide (TAMBOCOR) 50 MG tablet TAKE 1 TABLET BY MOUTH TWO  TIMES DAILY  . furosemide (LASIX) 20 MG tablet Take 20 mg by mouth daily.  Marland Kitchen HYDROcodone-acetaminophen (NORCO/VICODIN) 5-325 MG tablet Take 1 tablet by mouth every 4 (four) hours as needed for moderate pain.  . montelukast (SINGULAIR) 10 MG tablet Take 10 mg by mouth daily before breakfast.   . Multiple Vitamins tablet Take 1 tablet by mouth daily.  . nitroGLYCERIN (NITROSTAT) 0.4 MG SL tablet Place 1 tablet (0.4 mg total) under the tongue every 5 (five) minutes x 3 doses as needed for chest pain.  . Omega-3 Fatty Acids (FISH OIL) 1000 MG CAPS Take 1,000 mg by mouth 2 (two) times daily.  Marland Kitchen omeprazole (PRILOSEC) 40 MG capsule Take 40 mg by mouth at bedtime.   . predniSONE (DELTASONE) 50 MG tablet One tablet a day  . telmisartan (MICARDIS) 80 MG tablet TAKE 1 TABLET BY MOUTH  DAILY  . vitamin E 1000 UNIT capsule Take 1,000 Units by mouth daily.     Allergies: Allergies  Allergen Reactions    . Aminophylline   . Carvedilol     SOB/Fatigue  . Lisinopril Cough  . Tenormin [Atenolol] Other (See Comments)    Fatigue, low HR  . Zolpidem Tartrate Other (See Comments)    hallucinations    Social History: The patient  reports that he quit smoking about 36 years ago. His smoking use included cigarettes. He has a 45.00 pack-year smoking history. He has  never used smokeless tobacco. He reports that he does not drink alcohol or use drugs.   Family History: The patient's family history includes Alzheimer's disease in his father; Breast cancer in his mother; Crohn's disease in his brother; Heart disease in his brother; Hypertension in his brother.   Review of Systems: Please see the history of present illness.   All other systems are reviewed and negative.   Physical Exam: VS:  BP 140/68   Pulse 65   Ht 5\' 7"  (1.702 m)   Wt 255 lb (115.7 kg)   SpO2 96%   BMI 39.94 kg/m  .  BMI Body mass index is 39.94 kg/m.  Wt Readings from Last 3 Encounters:  06/14/19 255 lb (115.7 kg)  02/15/19 255 lb (115.7 kg)  01/03/19 250 lb 1.9 oz (113.5 kg)    General: Pleasant. Elderly. Obese. He is alert and in no acute distress.   Cardiac: Regular rate and rhythm. No murmurs, rubs, or gallops. Trace edema.  Respiratory:  Lungs are fairly clear to auscultation bilaterally but with increased work of breathing. He was ambulated in the office - resting sat of 96% that fell to 92% - does not qualify for any oxygen.  GI: Soft and nontender.  MS: No deformity or atrophy. Gait and ROM intact.  Skin: Warm and dry. Color is normal.  Neuro:  Strength and sensation are intact and no gross focal deficits noted.  Psych: Alert, appropriate and with normal affect.   LABORATORY DATA:  EKG:  EKG is ordered today.  Personally reviewed by me. This demonstrates NSR and is unchanged. HR today is 65.  Lab Results  Component Value Date   WBC 10.7 (H) 03/27/2018   HGB 14.5 03/27/2018   HCT 44.0 03/27/2018    PLT 176 03/27/2018   GLUCOSE 102 (H) 03/27/2018   CHOL 182 02/28/2017   TRIG 52 02/28/2017   HDL 52 02/28/2017   LDLCALC 120 (H) 02/28/2017   ALT 11 (L) 01/26/2015   AST 18 01/26/2015   NA 140 03/27/2018   K 4.0 03/27/2018   CL 102 03/27/2018   CREATININE 1.26 (H) 03/27/2018   BUN 18 03/27/2018   CO2 29 03/27/2018   TSH 2.270 01/26/2015   INR 1.1 10/17/2015   HGBA1C 5.5 02/28/2017       BNP (last 3 results) No results for input(s): BNP in the last 8760 hours.  ProBNP (last 3 results) No results for input(s): PROBNP in the last 8760 hours.   Other Studies Reviewed Today:  EchoStudy Conclusions2018 - Left ventricle: The cavity size was normal. Wall thickness was increased in a pattern of moderate LVH. Systolic function was normal. The estimated ejection fraction was in the range of 60% to 65%. Wall motion was normal; there were no regional wall motion abnormalities. Doppler parameters are consistent with abnormal left ventricular relaxation (grade 1 diastolic dysfunction). - Pulmonary arteries: Systolic pressure was mildly increased.  Impressions:  - Vigorous LV systolic function; mild diastolic dysfunction; moderate LVH; trace TR with mildly elevated pulmonary pressure.    Left Heart Cath and Coronary Angiography2017  Conclusion  1. Mild luminal irregularities involving the RCA and LAD, similar to prior catheterizations. No obstructive coronary artery disease. 2. Mildly to moderately elevated left ventricular filling pressure (23 mmHg). 3. Normal left ventricular contraction.  Plan: 1. Continue medical management and primary prevention.      Assessment/Plan:  1. Shortness of breath - probably multifactorial - has had lung cancer/COVID/COPD and probably some component  of diastolic dysfunction. Will check lab today - will update his Echo as well.   2. PAF - in sinus by exam and EKG   3. Chronic anticoagulation - other  than cost - no problems noted. Surveillance lab today.   4. Non obstructive CAD - per cath in 2017 - having some atypical chest pain - does not sound like angina - will see what his echo shows first.   5. Lung cancer - has had XRT - getting repeat scans later this month as well.   6. HTN - BP fair - for now would follow.   7. HLD - checking lab today - he has had statin recommended in the past - he had not wanted to take.   8. Obesity - he is trying....  9. CKD - lab today.  10. Prior COVID illness - continues to feel weak/short of breath. May be "long hauler". See plan as above.   Current medicines are reviewed with the patient today.  The patient does not have concerns regarding medicines other than what has been noted above.  The following changes have been made:  See above.  Labs/ tests ordered today include:    Orders Placed This Encounter  Procedures  . Basic metabolic panel  . CBC  . Hepatic function panel  . Lipid panel  . Pro b natriuretic peptide (BNP)  . EKG 12-Lead  . ECHOCARDIOGRAM COMPLETE     Disposition:   FU with [place] in about 6 to 8 weeks to recheck.     Patient is agreeable to this plan and will call if any problems develop in the interim.   SignedTruitt Merle, NP  06/14/2019 10:59 AM  [facility] [address] [place], [place]  [address] Phone: [phone] Fax: [phone]

## 2019-06-14 ENCOUNTER — Ambulatory Visit: Payer: Medicare Other | Admitting: Nurse Practitioner

## 2019-06-14 ENCOUNTER — Encounter: Payer: Self-pay | Admitting: Nurse Practitioner

## 2019-06-14 ENCOUNTER — Telehealth: Payer: Self-pay

## 2019-06-14 ENCOUNTER — Encounter: Payer: Self-pay | Admitting: Podiatry

## 2019-06-14 ENCOUNTER — Other Ambulatory Visit: Payer: Self-pay

## 2019-06-14 ENCOUNTER — Ambulatory Visit: Payer: Medicare Other | Admitting: Podiatry

## 2019-06-14 ENCOUNTER — Encounter (INDEPENDENT_AMBULATORY_CARE_PROVIDER_SITE_OTHER): Payer: Self-pay

## 2019-06-14 ENCOUNTER — Ambulatory Visit (INDEPENDENT_AMBULATORY_CARE_PROVIDER_SITE_OTHER): Payer: Medicare Other

## 2019-06-14 VITALS — BP 140/68 | HR 65 | Ht 67.0 in | Wt 255.0 lb

## 2019-06-14 DIAGNOSIS — M7750 Other enthesopathy of unspecified foot: Secondary | ICD-10-CM

## 2019-06-14 DIAGNOSIS — I10 Essential (primary) hypertension: Secondary | ICD-10-CM

## 2019-06-14 DIAGNOSIS — I480 Paroxysmal atrial fibrillation: Secondary | ICD-10-CM

## 2019-06-14 DIAGNOSIS — Z79899 Other long term (current) drug therapy: Secondary | ICD-10-CM | POA: Diagnosis not present

## 2019-06-14 DIAGNOSIS — M778 Other enthesopathies, not elsewhere classified: Secondary | ICD-10-CM

## 2019-06-14 DIAGNOSIS — I259 Chronic ischemic heart disease, unspecified: Secondary | ICD-10-CM | POA: Diagnosis not present

## 2019-06-14 DIAGNOSIS — Z86010 Personal history of colonic polyps: Secondary | ICD-10-CM | POA: Insufficient documentation

## 2019-06-14 DIAGNOSIS — U071 COVID-19: Secondary | ICD-10-CM

## 2019-06-14 DIAGNOSIS — M7752 Other enthesopathy of left foot: Secondary | ICD-10-CM

## 2019-06-14 DIAGNOSIS — Z7901 Long term (current) use of anticoagulants: Secondary | ICD-10-CM

## 2019-06-14 DIAGNOSIS — M722 Plantar fascial fibromatosis: Secondary | ICD-10-CM | POA: Diagnosis not present

## 2019-06-14 DIAGNOSIS — R0609 Other forms of dyspnea: Secondary | ICD-10-CM

## 2019-06-14 DIAGNOSIS — R0600 Dyspnea, unspecified: Secondary | ICD-10-CM

## 2019-06-14 DIAGNOSIS — Z860101 Personal history of adenomatous and serrated colon polyps: Secondary | ICD-10-CM | POA: Insufficient documentation

## 2019-06-14 DIAGNOSIS — J309 Allergic rhinitis, unspecified: Secondary | ICD-10-CM | POA: Insufficient documentation

## 2019-06-14 DIAGNOSIS — I493 Ventricular premature depolarization: Secondary | ICD-10-CM | POA: Insufficient documentation

## 2019-06-14 NOTE — Progress Notes (Signed)
Subjective:  Patient ID: [name] [name], male    DOB: [birth year],  MRN: [id number] HPI Chief Complaint  Patient presents with  . Foot Pain    Plantar heel left - aching x 6 months, AM pain, tried [employer] - not helping  . New Patient (Initial Visit)    79 y.o. male presents with the above complaint.   ROS: Denies fever chills nausea vomiting muscle aches pains calf pain back pain chest pain shortness of breath.  Past Medical History:  Diagnosis Date  . Angina    chest pain- cardiac cath. followed in 2010, record available ,  told then that he should f/u /w Dr. Marisue Humble    . Arthritis    R knee, back    . Asthma    uses  singulair  . Cancer [facility])    prostate, melanoma- 2010, excision    . Chronic kidney disease    prostate cancer - surg. removal- 1996  . Colon polyps   . Diverticulitis   . Dysrhythmia    palpitations, followed by Dr.Ehinger, seen in prep for surgery on 03/27/2011   . GERD (gastroesophageal reflux disease)   . Hepatitis    jaundice- many yrs. ago  . Hiatal hernia   . Hypertension   . 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 OSA (obstructive sleep apnea) 04/11/2015   Mild to moderate OSA with AHI 13.3/hr overall and AHI 36.8/hr during REM sleep.  Oxygen saturations were as low as 86% during respiratory events.  . Pneumonia    hosp. 20 yrs. ago  . Prostate cancer ([place]) 1995  . Sleep apnea    study done, 10 yrs. ago, told that he needed  CPAP but  never used    Past Surgical History:  Procedure Laterality Date  . CARDIAC CATHETERIZATION     2010  . CARDIAC CATHETERIZATION N/A 10/22/2015   Procedure: Left Heart Cath and Coronary Angiography;  Surgeon: Nelva Bush, MD;  Location: [place] CV LAB;  Service: Cardiovascular;  Laterality: N/A;  . FRACTURE SURGERY     L wrist, hardware- 1982  . JOINT REPLACEMENT     L knee, 2009  . LEFT HEART CATHETERIZATION WITH CORONARY ANGIOGRAM N/A 09/08/2011   Procedure: LEFT HEART CATHETERIZATION WITH CORONARY ANGIOGRAM;   Surgeon: Sinclair Grooms, MD;  Location[facility] CATH LAB;  Service: Cardiovascular;  Laterality: N/A;  . PROSTATECTOMY     for ca  . TONSILLECTOMY     as child  . TOTAL KNEE ARTHROPLASTY  04/22/2011   Procedure: TOTAL KNEE ARTHROPLASTY;  Surgeon: Garald Balding, MD;  Location: [facility];  Service: Orthopedics;  Laterality: Right;    Current Outpatient Medications:  .  albuterol (PROVENTIL HFA;VENTOLIN HFA) 108 (90 BASE) MCG/ACT inhaler, Inhale 2 puffs into the lungs every 6 (six) hours as needed for wheezing or shortness of breath., Disp: , Rfl:  .  budesonide-formoterol (SYMBICORT) 160-4.5 MCG/ACT inhaler, Inhale 2 puffs into the lungs 2 (two) times daily., Disp: 2 Inhaler, Rfl: 0 .  Calcium Carbonate-Vitamin D (CALCIUM + D) 600-200 MG-UNIT TABS, Take 1 tablet by mouth 2 (two) times daily., Disp: , Rfl:  .  diltiazem (TIAZAC) 120 MG 24 hr capsule, daily., Disp: , Rfl:  .  ELIQUIS 5 MG TABS tablet, TAKE 1 TABLET BY MOUTH TWO  TIMES DAILY, Disp: 180 tablet, Rfl: 3 .  flecainide (TAMBOCOR) 50 MG tablet, TAKE 1 TABLET BY MOUTH TWO  TIMES DAILY, Disp: 180 tablet, Rfl: 3 .  furosemide (LASIX) 20 MG tablet, Take 20 mg by mouth daily., Disp: , Rfl:  .  HYDROcodone-acetaminophen (NORCO/VICODIN) 5-325 MG tablet, Take 1 tablet by mouth every 4 (four) hours as needed for moderate pain., Disp: 20 tablet, Rfl: 0 .  montelukast (SINGULAIR) 10 MG tablet, Take 10 mg by mouth daily before breakfast. , Disp: , Rfl:  .  Multiple Vitamins tablet, Take 1 tablet by mouth daily., Disp: , Rfl:  .  nitroGLYCERIN (NITROSTAT) 0.4 MG SL tablet, Place 1 tablet (0.4 mg total) under the tongue every 5 (five) minutes x 3 doses as needed for chest pain., Disp: 25 tablet, Rfl: 3 .  Omega-3 Fatty Acids (FISH OIL) 1000 MG CAPS, Take 1,000 mg by mouth 2 (two) times daily., Disp: , Rfl:  .  omeprazole (PRILOSEC) 40 MG capsule, Take 40 mg by mouth at bedtime. , Disp: , Rfl:  .  predniSONE (DELTASONE) 50 MG tablet, One tablet a day,  Disp: 5 tablet, Rfl: 0 .  telmisartan (MICARDIS) 80 MG tablet, TAKE 1 TABLET BY MOUTH  DAILY, Disp: 90 tablet, Rfl: 3 .  vitamin E 1000 UNIT capsule, Take 1,000 Units by mouth daily., Disp: , Rfl:   Allergies  Allergen Reactions  . Aminophylline   . Carvedilol     SOB/Fatigue  . Lisinopril Cough  . Tenormin [Atenolol] Other (See Comments)    Fatigue, low HR  . Zolpidem Tartrate Other (See Comments)    hallucinations   Review of Systems Objective:  There were no vitals filed for this visit.  General: Well developed, nourished, in no acute distress, alert and oriented x3   Dermatological: Skin is warm, dry and supple bilateral. Nails x 10 are well maintained; remaining integument appears unremarkable at this time. There are no open sores, no preulcerative lesions, no rash or signs of infection present.  Vascular: Dorsalis Pedis artery and Posterior Tibial artery pedal pulses are 2/4 bilateral with immedate capillary fill time. Pedal hair growth present. No varicosities and no lower extremity edema present bilateral.   Neruologic: Grossly intact via light touch bilateral. Vibratory intact via tuning fork bilateral. Protective threshold with Semmes Wienstein monofilament intact to all pedal sites bilateral. Patellar and Achilles deep tendon reflexes 2+ bilateral. No Babinski or clonus noted bilateral.   Musculoskeletal: No gross boney pedal deformities bilateral. No pain, crepitus, or limitation noted with foot and ankle range of motion bilateral. Muscular strength 5/5 in all groups tested bilateral.  Pain on palpation medial calcaneal tubercle of the left heel.  Gait: Unassisted, Nonantalgic.    Radiographs:  Radiographs taken today demonstrate a small plantar distally oriented calcaneal heel spur with soft tissue increase in density or fat-containing insertion site of left heel.  Assessment & Plan:   Assessment: Plantar fasciitis left foot.  Plan: Discussed etiology pathology  conservative surgical therapies at this point injected left heel 20 mg Kenalog 5 mg Marcaine point of maximal tenderness.  Placed him in a plantar fascial brace.  Discussed appropriate shoe gear stretching size ice therapy shoe modifications we will follow-up with him in 1 month     Tyan Dy T. [place], [place]

## 2019-06-14 NOTE — Telephone Encounter (Signed)
**Note De-Identified Nivaan Dicenzo Obfuscation** Per Cecille Rubin Gerhardt's request I I called the pt to discuss the cost of his Eliquis and to discuss options to lower cost if possible.  No answer so I left a message on his VM asking him to call me back.

## 2019-06-14 NOTE — Patient Instructions (Signed)

## 2019-06-14 NOTE — Patient Instructions (Addendum)
After Visit Summary:  We will be checking the following labs today - BMET, CBC, HPF, Lipids, BNP   Medication Instructions:    Continue with your current medicines.   Samples of Eliquis if we have   If you need a refill on your cardiac medications before your next appointment, please call your pharmacy.     Testing/Procedures To Be Arranged:  Echocardiogram  Follow-Up:   See me in 6 to 8 weeks    At [facility], you and your health needs are our priority.  As part of our continuing mission to provide you with exceptional heart care, we have created designated Provider Care Teams.  These Care Teams include your primary Cardiologist (physician) and Advanced Practice Providers (APPs -  Physician Assistants and Nurse Practitioners) who all work together to provide you with the care you need, when you need it.  Special Instructions:  . Stay safe, stay home, wash your hands for at least 20 seconds and wear a mask when out in public.  . It was good to talk with you today.  . Ask your insurance person "is Eliquis preferred"   Call the [place] office at [phone] if you have any questions, problems or concerns.

## 2019-06-15 LAB — CBC
Hematocrit: 36.6 % — ABNORMAL LOW (ref 37.5–51.0)
Hemoglobin: 12.5 g/dL — ABNORMAL LOW (ref 13.0–17.7)
MCH: 30.5 pg (ref 26.6–33.0)
MCHC: 34.2 g/dL (ref 31.5–35.7)
MCV: 89 fL (ref 79–97)
Platelets: 174 10*3/uL (ref 150–450)
RBC: 4.1 x10E6/uL — ABNORMAL LOW (ref 4.14–5.80)
RDW: 11.8 % (ref 11.6–15.4)
WBC: 5.7 10*3/uL (ref 3.4–10.8)

## 2019-06-15 LAB — LIPID PANEL
Chol/HDL Ratio: 4 ratio (ref 0.0–5.0)
Cholesterol, Total: 159 mg/dL (ref 100–199)
HDL: 40 mg/dL (ref >39)
LDL Chol Calc (NIH): 102 mg/dL — ABNORMAL HIGH (ref 0–99)
Triglycerides: 89 mg/dL (ref 0–149)
VLDL Cholesterol Cal: 17 mg/dL (ref 5–40)

## 2019-06-15 LAB — BASIC METABOLIC PANEL
BUN/Creatinine Ratio: 13 (ref 10–24)
BUN: 14 mg/dL (ref 8–27)
CO2: 24 mmol/L (ref 20–29)
Calcium: 8.9 mg/dL (ref 8.6–10.2)
Chloride: 104 mmol/L (ref 96–106)
Creatinine, Ser: 1.07 mg/dL (ref 0.76–1.27)
GFR calc Af Amer: 75 mL/min/{1.73_m2} (ref >59)
GFR calc non Af Amer: 65 mL/min/{1.73_m2} (ref >59)
Glucose: 98 mg/dL (ref 65–99)
Potassium: 4.1 mmol/L (ref 3.5–5.2)
Sodium: 144 mmol/L (ref 134–144)

## 2019-06-15 LAB — HEPATIC FUNCTION PANEL
ALT: 7 IU/L (ref 0–44)
AST: 14 IU/L (ref 0–40)
Albumin: 3.9 g/dL (ref 3.7–4.7)
Alkaline Phosphatase: 86 IU/L (ref 39–117)
Bilirubin Total: 0.9 mg/dL (ref 0.0–1.2)
Bilirubin, Direct: 0.27 mg/dL (ref 0.00–0.40)
Total Protein: 6.1 g/dL (ref 6.0–8.5)

## 2019-06-15 LAB — PRO B NATRIURETIC PEPTIDE: NT-Pro BNP: 372 pg/mL (ref 0–486)

## 2019-06-15 NOTE — Telephone Encounter (Signed)
The pt requested that I discuss the cost of his Eliquis with his wife. The pts wife states that he has gone into the donut hole and that a 30 day supply will cost them $340.  She states that the pt has never been approved for pt asst in the past because they never spend 3% of their 23,000 yearly income as they have always been given samples that will get him through the rest of the year.  I explained that the office is limited on the amount of samples we have in the office and that they are meant for patients who are newly starting on Eliquis.  I advised that if they can pay for 1 month supply of the pts Eliquis that may meet the pts 3% out of pocket requirement but that they will need to check with [employer] to hopefully get an idea of how much the 3% would be (I estimate maybe $70 but unsure).  I did provide the pts wife with [employer] pt asst phone number and advised her to call with questions concerning the pts eligibility to be accepted into their asst program and to request that they mail them an application.  She verbalized understanding and thanked me for calling them back.

## 2019-06-15 NOTE — Telephone Encounter (Signed)
Follow up  Pt returning call from [place] regarding his eliquis  Please call

## 2019-06-29 ENCOUNTER — Ambulatory Visit (HOSPITAL_COMMUNITY): Payer: Medicare Other | Attending: Cardiology

## 2019-06-29 ENCOUNTER — Other Ambulatory Visit: Payer: Self-pay

## 2019-06-29 DIAGNOSIS — R0609 Other forms of dyspnea: Secondary | ICD-10-CM

## 2019-06-29 DIAGNOSIS — U071 COVID-19: Secondary | ICD-10-CM | POA: Diagnosis present

## 2019-06-29 DIAGNOSIS — Z79899 Other long term (current) drug therapy: Secondary | ICD-10-CM | POA: Diagnosis present

## 2019-06-29 DIAGNOSIS — Z7901 Long term (current) use of anticoagulants: Secondary | ICD-10-CM

## 2019-06-29 DIAGNOSIS — I259 Chronic ischemic heart disease, unspecified: Secondary | ICD-10-CM

## 2019-06-29 DIAGNOSIS — I480 Paroxysmal atrial fibrillation: Secondary | ICD-10-CM | POA: Diagnosis present

## 2019-06-29 DIAGNOSIS — R0600 Dyspnea, unspecified: Secondary | ICD-10-CM | POA: Diagnosis present

## 2019-07-04 ENCOUNTER — Telehealth: Payer: Self-pay | Admitting: Nurse Practitioner

## 2019-07-04 NOTE — Telephone Encounter (Signed)
Called the patient back regarding his request for Echo results. The patient has been notified of the result and verbalized understanding.  All questions (if any) were answered. Wilma Flavin, RN 07/04/2019 9:59 AM

## 2019-07-04 NOTE — Telephone Encounter (Signed)
Follow Up:   Pt wants to now if his Echo results are ready from 06-29-19 please?

## 2019-07-12 ENCOUNTER — Other Ambulatory Visit: Payer: Self-pay

## 2019-07-12 ENCOUNTER — Ambulatory Visit (INDEPENDENT_AMBULATORY_CARE_PROVIDER_SITE_OTHER): Payer: Medicare Other | Admitting: Podiatry

## 2019-07-12 ENCOUNTER — Encounter: Payer: Self-pay | Admitting: Podiatry

## 2019-07-12 DIAGNOSIS — M722 Plantar fascial fibromatosis: Secondary | ICD-10-CM

## 2019-07-12 NOTE — Progress Notes (Signed)
He presents today for follow-up of his plantar fasciitis he states that his left heel is about 50% better but is not well yet.  Objective: Vital signs are stable he is alert and oriented x3 there is no erythema edema cellulitis drainage or odor still has pain on palpation medial calcaneal tubercle of the left heel.  No open lesions or wounds are noted.  Assessment: Pain in limb secondary to plantar fasciitis 50% resolved.  Plan: At this point we went ahead and reinjected 20 mg of Kenalog 5 mg of Marcaine with 2 mg of dexamethasone.  Follow-up with him in 1 month for another injection if necessary

## 2019-07-13 ENCOUNTER — Telehealth: Payer: Self-pay | Admitting: Interventional Cardiology

## 2019-07-13 NOTE — Telephone Encounter (Signed)
   Pt c/o medication issue:  1. Name of Medication:ELIQUIS 5 MG TABS tablet  2. How are you currently taking this medication (dosage and times per day)?   3. Are you having a reaction (difficulty breathing--STAT)?   4. What is your medication issue? Lori from [facility] called she said she sent papers to Dr. Tamala Julian for patient assistance for this med. She said pt really can't afford it.  Please advise

## 2019-07-13 NOTE — Telephone Encounter (Signed)
Paperwork received and completed.  Faxed back to [facility] at  [place] as requested on cover page.

## 2019-07-20 NOTE — Progress Notes (Signed)
CARDIOLOGY OFFICE NOTE  Date:  07/25/2019    [name] Date of Birth: 1941/02/09 Medical Record #[id number]  PCP:  Gaynelle Arabian, MD  Cardiologist:  Jennings Books    Chief Complaint  Patient presents with  . Follow-up    History of Present Illness: [name] [name] is a 79 y.o. male who presents today for a 6 week check. Seen for Dr. Tamala Julian.   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  Last seen by Dr. Tamala Julian in May 2020 with a telehealth visit. He had had a sudden onset of dizziness the night prior - BP was up - took extra half of ARB and multiple BP checks. Was fine at the time of the visit. Noted that he had been in the ER back in January 2020 - markers were negative. He had had no recurrence. Dr. Tamala Julian was concerned this was perhaps a spell of AF. Low dose statin was added to his regimen given known atherosclerosis.I then saw him back in October - he had had COVID - noted enlarging right upper lobe ground glass nodule - concerning for slow growing neoplasm. Last seen by me in December - did in fact have lung cancer - attempted radiation - but could not lie on the table initially.   Last seen about 6 weeks ago - still short of breath - had completed his XRT. Did not feel well. Some sharp chest discomfort noted. Remained weak. Seeing pulmonary thru [facility]. Was worried that COVID had affected his heart. We got his echo updated - overall reassuring.   The patient does not have symptoms concerning for COVID-19 infection (fever, chills, cough, or new shortness of breath).   Comes in today. Here alone. I saw his wife last week. He notes that he is doing better. He has lost a few pounds. His breathing is stable. No chest pain. Tolerating his medicines  but already in the doughnut hole - needing samples of his Eliquis. BP lower over at oncology. He does not wish to be on any more medicine - he is on a multitude. He is trying to be more active. Feels better. Hopeful to lose a few more pounds. We reviewed his echo results - this was reassuring.    Past Medical History:  Diagnosis Date  . Angina    chest pain- cardiac cath. followed in 2010, record available ,  told then that he should f/u /w Dr. Marisue Humble    . Arthritis    R knee, back    . Asthma    uses  singulair  . Cancer [facility])    prostate, melanoma- 2010, excision    . Chronic kidney disease    prostate cancer - surg. removal- 1996  . Colon polyps   . Diverticulitis   . Dysrhythmia    palpitations, followed by Dr.Ehinger, seen in prep for surgery on 03/27/2011   . GERD (gastroesophageal reflux disease)   . Hepatitis    jaundice- many yrs. ago  . Hiatal hernia   . Hypertension   . Melanoma ([place])    Face  . OSA (obstructive sleep apnea) 04/11/2015   Mild to moderate OSA with AHI 13.3/hr overall and AHI 36.8/hr during REM sleep.  Oxygen saturations were as low as 86% during respiratory  events.  . Pneumonia    hosp. 20 yrs. ago  . Prostate cancer ([place]) 1995  . Sleep apnea    study done, 10 yrs. ago, told that he needed  CPAP but  never used     Past Surgical History:  Procedure Laterality Date  . CARDIAC CATHETERIZATION     2010  . CARDIAC CATHETERIZATION N/A 10/22/2015   Procedure: Left Heart Cath and Coronary Angiography;  Surgeon: Nelva Bush, MD;  Location: [place] [facility];  Service: Cardiovascular;  Laterality: N/A;  . FRACTURE SURGERY     L wrist, hardware- 1982  . JOINT REPLACEMENT     L knee, 2009  . LEFT HEART CATHETERIZATION WITH CORONARY ANGIOGRAM N/A 09/08/2011   Procedure: LEFT HEART CATHETERIZATION WITH CORONARY ANGIOGRAM;  Surgeon: Sinclair Grooms, MD;  Location: [facility] CATH LAB;  Service: Cardiovascular;  Laterality: N/A;  . PROSTATECTOMY     for ca  .  TONSILLECTOMY     as child  . TOTAL KNEE ARTHROPLASTY  04/22/2011   Procedure: TOTAL KNEE ARTHROPLASTY;  Surgeon: Garald Balding, MD;  Location: [place];  Service: Orthopedics;  Laterality: Right;     Medications: Current Meds  Medication Sig  . albuterol (PROVENTIL HFA;VENTOLIN HFA) 108 (90 BASE) MCG/ACT inhaler Inhale 2 puffs into the lungs every 6 (six) hours as needed for wheezing or shortness of breath.  . budesonide-formoterol (SYMBICORT) 160-4.5 MCG/ACT inhaler Inhale 2 puffs into the lungs 2 (two) times daily.  . Calcium Carbonate-Vitamin D (CALCIUM + D) 600-200 MG-UNIT TABS Take 1 tablet by mouth 2 (two) times daily.  Marland Kitchen diltiazem (TIAZAC) 120 MG 24 hr capsule daily.  Marland Kitchen ELIQUIS 5 MG TABS tablet TAKE 1 TABLET BY MOUTH TWO  TIMES DAILY  . flecainide (TAMBOCOR) 50 MG tablet TAKE 1 TABLET BY MOUTH TWO  TIMES DAILY  . furosemide (LASIX) 20 MG tablet Take 20 mg by mouth daily.  Marland Kitchen HYDROcodone-acetaminophen (NORCO/VICODIN) 5-325 MG tablet Take 1 tablet by mouth every 4 (four) hours as needed for moderate pain.  . montelukast (SINGULAIR) 10 MG tablet Take 10 mg by mouth daily before breakfast.   . Multiple Vitamins tablet Take 1 tablet by mouth daily.  . nitroGLYCERIN (NITROSTAT) 0.4 MG SL tablet Place 1 tablet (0.4 mg total) under the tongue every 5 (five) minutes x 3 doses as needed for chest pain.  . Omega-3 Fatty Acids (FISH OIL) 1000 MG CAPS Take 1,000 mg by mouth 2 (two) times daily.  Marland Kitchen omeprazole (PRILOSEC) 40 MG capsule Take 40 mg by mouth at bedtime.   Marland Kitchen telmisartan (MICARDIS) 80 MG tablet TAKE 1 TABLET BY MOUTH  DAILY  . vitamin E 1000 UNIT capsule Take 1,000 Units by mouth daily.     Allergies: Allergies  Allergen Reactions  . Aminophylline   . Carvedilol     SOB/Fatigue  . Lisinopril Cough  . Tenormin [Atenolol] Other (See Comments)    Fatigue, low HR  . Zolpidem Tartrate Other (See Comments)    hallucinations    Social History: The patient  reports that he quit  smoking about 36 years ago. His smoking use included cigarettes. He has a 45.00 pack-year smoking history. He has never used smokeless tobacco. He reports that he does not drink alcohol or use drugs.   Family History: The patient's family history includes Alzheimer's disease in his father; Breast cancer in his mother; Crohn's disease in his brother; Heart disease in his brother; Hypertension in his brother.   Review  of Systems: Please see the history of present illness.   All other systems are reviewed and negative.   Physical Exam: VS:  BP 140/60   Pulse 61   Ht 5\' 7"  (1.702 m)   Wt 251 lb 6.4 oz (114 kg)   SpO2 93%   BMI 39.37 kg/m  .  BMI Body mass index is 39.37 kg/m.  Wt Readings from Last 3 Encounters:  07/25/19 251 lb 6.4 oz (114 kg)  06/14/19 255 lb (115.7 kg)  02/15/19 255 lb (115.7 kg)    General: Pleasant. Alert and in no acute distress. Weight is down a few pounds.   Cardiac: Regular rate and rhythm. No murmurs, rubs, or gallops. No significant edema.  Respiratory:  Decreased breath sounds but with normal work of breathing.  GI: Soft and nontender. Obese.  MS: No deformity or atrophy. Gait and ROM intact.  Skin: Warm and dry. Color is normal.  Neuro:  Strength and sensation are intact and no gross focal deficits noted.  Psych: Alert, appropriate and with normal affect.   LABORATORY DATA:  EKG:  EKG is ordered today.  Personally reviewed by me. This demonstrates NSR.  Lab Results  Component Value Date   WBC 5.7 06/14/2019   HGB 12.5 (L) 06/14/2019   HCT 36.6 (L) 06/14/2019   PLT 174 06/14/2019   GLUCOSE 98 06/14/2019   CHOL 159 06/14/2019   TRIG 89 06/14/2019   HDL 40 06/14/2019   LDLCALC 102 (H) 06/14/2019   ALT 7 06/14/2019   AST 14 06/14/2019   NA 144 06/14/2019   K 4.1 06/14/2019   CL 104 06/14/2019   CREATININE 1.07 06/14/2019   BUN 14 06/14/2019   CO2 24 06/14/2019   TSH 2.270 01/26/2015   INR 1.1 10/17/2015   HGBA1C 5.5 02/28/2017      BNP (last 3 results) No results for input(s): BNP in the last 8760 hours.  ProBNP (last 3 results) Recent Labs    06/14/19 1122  PROBNP 372     Other Studies Reviewed Today:  ECHO IMPRESSIONS 06/2019  1. Left ventricular ejection fraction, by estimation, is 60 to 65%. The  left ventricle has normal function. The left ventricle has no regional  wall motion abnormalities. There is mild concentric left ventricular  hypertrophy. Left ventricular diastolic  parameters are consistent with Grade I diastolic dysfunction (impaired  relaxation).  2. Right ventricular systolic function is normal. The right ventricular  size is normal. There is moderately elevated pulmonary artery systolic  pressure.  3. Left atrial size was moderately dilated.  4. Right atrial size was mildly dilated.  5. The mitral valve is grossly normal. Trivial mitral valve  regurgitation. No evidence of mitral stenosis.  6. The aortic valve is tricuspid. Aortic valve regurgitation is not  visualized. Mild aortic valve sclerosis is present, with no evidence of  aortic valve stenosis.  7. The inferior vena cava is normal in size with greater than 50%  respiratory variability, suggesting right atrial pressure of 3 mmHg.     Left Heart Cath and Coronary Angiography2017  Conclusion  1. Mild luminal irregularities involving the RCA and LAD, similar to prior catheterizations. No obstructive coronary artery disease. 2. Mildly to moderately elevated left ventricular filling pressure (23 mmHg). 3. Normal left ventricular contraction.  Plan: 1. Continue medical management and primary prevention.     Assessment/Plan:  1. Shortness of breath - most likely multifactorial - lung cacer/COVID/COPD/diastolic dysfunction - echo was reassuring - clinically he  is feeling better.   2. PAF - remains in sinus - on Flecainide.   3. Chronic anticoagulation - other than cost - no issues noted.   4.  Minimal CAD - no further chest pain. Needs risk factor modification.   5. Lung cancer - has had scans updated - waiting on PFTs - per oncology.   6. HTN - BP is fair - no changes made today.   7. HLD - has not wanted to be on statin therapy despite recommendation.   8. Obesity - he is trying....down 4 pounds - encouragement given.   9. CKD  10. Prior COVID illness.   6. COVID-19 Education: The signs and symptoms of COVID-19 were discussed with the patient and how to seek care for testing (follow up with PCP or arrange E-visit).  The importance of social distancing, staying at home, hand hygiene and wearing a mask when out in public were discussed today.  Current medicines are reviewed with the patient today.  The patient does not have concerns regarding medicines other than what has been noted above.  The following changes have been made:  See above.  Labs/ tests ordered today include:    Orders Placed This Encounter  Procedures  . EKG 12-Lead     Disposition:   FU with Dr. Tamala Julian in 4 months.   Patient is agreeable to this plan and will call if any problems develop in the interim.   SignedTruitt Merle, NP  07/25/2019 12:41 PM  [place] [address] [place], [place]  [address] Phone: [phone] Fax: [phone]

## 2019-07-25 ENCOUNTER — Ambulatory Visit (INDEPENDENT_AMBULATORY_CARE_PROVIDER_SITE_OTHER): Payer: Medicare Other | Admitting: Nurse Practitioner

## 2019-07-25 ENCOUNTER — Encounter: Payer: Self-pay | Admitting: Nurse Practitioner

## 2019-07-25 ENCOUNTER — Other Ambulatory Visit: Payer: Self-pay

## 2019-07-25 VITALS — BP 140/60 | HR 61 | Ht 67.0 in | Wt 251.4 lb

## 2019-07-25 DIAGNOSIS — I10 Essential (primary) hypertension: Secondary | ICD-10-CM

## 2019-07-25 DIAGNOSIS — U071 COVID-19: Secondary | ICD-10-CM

## 2019-07-25 DIAGNOSIS — G4733 Obstructive sleep apnea (adult) (pediatric): Secondary | ICD-10-CM

## 2019-07-25 DIAGNOSIS — I480 Paroxysmal atrial fibrillation: Secondary | ICD-10-CM | POA: Diagnosis not present

## 2019-07-25 DIAGNOSIS — I259 Chronic ischemic heart disease, unspecified: Secondary | ICD-10-CM | POA: Diagnosis not present

## 2019-07-25 DIAGNOSIS — Z79899 Other long term (current) drug therapy: Secondary | ICD-10-CM

## 2019-07-25 DIAGNOSIS — Z7901 Long term (current) use of anticoagulants: Secondary | ICD-10-CM

## 2019-07-25 NOTE — Patient Instructions (Addendum)
After Visit Summary:  We will be checking the following labs today - NONE   Medication Instructions:    Continue with your current medicines.    If you need a refill on your cardiac medications before your next appointment, please call your pharmacy.     Testing/Procedures To Be Arranged:  N/A  Follow-Up:   See Dr. Tamala Julian in about 4 months.     At [facility], you and your health needs are our priority.  As part of our continuing mission to provide you with exceptional heart care, we have created designated Provider Care Teams.  These Care Teams include your primary Cardiologist (physician) and Advanced Practice Providers (APPs -  Physician Assistants and Nurse Practitioners) who all work together to provide you with the care you need, when you need it.  Special Instructions:  . Stay safe, stay home, wash your hands for at least 20 seconds and wear a mask when out in public.  . It was good to talk with you today.    Call the [place] office at [phone] if you have any questions, problems or concerns.

## 2019-08-16 ENCOUNTER — Ambulatory Visit: Payer: Medicare Other | Admitting: Podiatry

## 2019-09-01 NOTE — Telephone Encounter (Signed)
**Note De-Identified [name] [name] Obfuscation** Letter received from [employer] Pt Asst program stating that they approved the pt for asst with his Eliquis  Approval is valid from 09/01/2019 until 08/31/2020. Application Case #: [id number]  The letter states that they have notified the pt of this approval as well.

## 2019-09-22 ENCOUNTER — Other Ambulatory Visit: Payer: Self-pay

## 2019-09-22 ENCOUNTER — Encounter: Payer: Self-pay | Admitting: Podiatry

## 2019-09-22 ENCOUNTER — Ambulatory Visit (INDEPENDENT_AMBULATORY_CARE_PROVIDER_SITE_OTHER): Payer: Medicare Other | Admitting: Podiatry

## 2019-09-22 DIAGNOSIS — M722 Plantar fascial fibromatosis: Secondary | ICD-10-CM

## 2019-09-22 NOTE — Progress Notes (Signed)
Presents today for follow-up of her left heel pain states that done good for a while but now is starting to get sore again.  Objective: Vital signs are stable alert oriented x3 has pain to palpation medial continue tubercle of the left heel.  Assessment: Plantar fasciitis left recurrent.  Plan: Reinjected the left heel today with Kenalog.  Follow-up with him in the near future.

## 2019-10-19 ENCOUNTER — Telehealth: Payer: Self-pay | Admitting: Interventional Cardiology

## 2019-10-19 MED ORDER — FLECAINIDE ACETATE 50 MG PO TABS: 50.0000 mg | ORAL_TABLET | Freq: Two times a day (BID) | ORAL | 2 refills | Status: DC

## 2019-10-19 NOTE — Telephone Encounter (Signed)
*  STAT* If patient is at the pharmacy, call can be transferred to refill team.   1. Which medications need to be refilled? (please list name of each medication and dose if known) new Prescription for Flecainide  2. Which pharmacy/location (including street and city if local pharmacy) is medication to be sent to? [facility]  3. Do they need a 30 day or 90 day supply?90 days and refills

## 2019-10-19 NOTE — Telephone Encounter (Signed)
Pt's medication was sent to pt's pharmacy as requested. Confirmation received.  °

## 2019-11-08 ENCOUNTER — Ambulatory Visit (INDEPENDENT_AMBULATORY_CARE_PROVIDER_SITE_OTHER): Payer: Medicare Other | Admitting: Podiatry

## 2019-11-08 ENCOUNTER — Other Ambulatory Visit: Payer: Self-pay

## 2019-11-08 DIAGNOSIS — M722 Plantar fascial fibromatosis: Secondary | ICD-10-CM | POA: Diagnosis not present

## 2019-11-08 NOTE — Progress Notes (Signed)
He presents today for follow-up of his plan fasciitis state is much better than it was previously however this has not gone away yet.  States that really bothers him when he is on his feet for long periods of time.  Objective: Vital signs are stable he is alert oriented x3 there is no erythema edema cellulitis drainage odor has pain on palpation medial calcaneal tubercle of the left heel.  Assessment: Chronic intractable plantar fasciitis left.  Plan: I injected Kenalog dexamethasone and Celestone directly into the point of maximal tenderness today a total of 3 cc of local anesthetic was utilized.  I will follow-up with him in about 1 month.

## 2019-11-22 NOTE — Progress Notes (Signed)
Cardiology Office Note:    Date:  11/23/2019   ID:  [name] [name], MRN [id number]  PCP:  Gaynelle Arabian, MD  Cardiologist:  Sinclair Grooms, MD   Referring MD: Gaynelle Arabian, MD   Chief Complaint  Patient presents with  . Atrial Fibrillation  . Congestive Heart Failure  . Coronary Artery Disease    History of Present Illness:    [name] [name] is a 79 y.o. 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  [name] is particularly concerned about an episode of headache, sharp focal left subclavicular chest pain, and severe elevation in blood pressure that occurred 2 weeks ago.  It led to a visit by EMS who recommended that he come to the hospital but he refused.  Blood pressures were significantly elevated.  He had taken his medications that day.  The episode resolved after 1 hour.  He has had similar episodes in the past dating back greater than 3 years.  It was not exertion related or stress related.  Blood pressures have been doing well since that time.  The headache was described as a right mid cranial discomfort.  There was no associated shortness of breath, palpitations, or other complaint.  He has chronic dyspnea on exertion that has been worse since he completed radiation therapy for lung cancer in December 2020.  He feels his breathing is been worse since he had COVID-19 disease in September 2020.  He has received 2 vaccination shots.  He has no energy.  He states that he sleeps well.  He sleeps in a recliner.  His wife does not know if he snores.  Past Medical History:  Diagnosis Date  . Angina    chest pain- cardiac cath. followed in 2010, record available ,  told then that he should f/u /w Dr. Marisue Humble    . Arthritis    R knee, back    .  Asthma    uses  singulair  . Cancer [facility])    prostate, melanoma- 2010, excision    . Chronic kidney disease    prostate cancer - surg. removal- 1996  . Colon polyps   . Diverticulitis   . Dysrhythmia    palpitations, followed by Dr.Ehinger, seen in prep for surgery on 03/27/2011   . GERD (gastroesophageal reflux disease)   . Hepatitis    jaundice- many yrs. ago  . Hiatal hernia   . Hypertension   . Melanoma ([place])    Face  . OSA (obstructive sleep apnea) 04/11/2015   Mild to moderate OSA with AHI 13.3/hr overall and AHI 36.8/hr during REM sleep.  Oxygen saturations were as low as 86% during respiratory events.  . Pneumonia    hosp. 20 yrs. ago  . Prostate cancer ([place]) 1995  . Sleep apnea    study done, 10 yrs. ago, told that he needed  CPAP but  never used     Past Surgical History:  Procedure Laterality Date  . CARDIAC CATHETERIZATION     2010  . CARDIAC CATHETERIZATION N/A 10/22/2015   Procedure: Left Heart Cath and Coronary Angiography;  Surgeon: Nelva Bush, MD;  Location: [place] [facility];  Service: Cardiovascular;  Laterality: N/A;  . FRACTURE SURGERY     L wrist, hardware- 1982  .  JOINT REPLACEMENT     L knee, 2009  . LEFT HEART CATHETERIZATION WITH CORONARY ANGIOGRAM N/A 09/08/2011   Procedure: LEFT HEART CATHETERIZATION WITH CORONARY ANGIOGRAM;  Surgeon: Sinclair Grooms, MD;  Location: [facility] CATH LAB;  Service: Cardiovascular;  Laterality: N/A;  . PROSTATECTOMY     for ca  . TONSILLECTOMY     as child  . TOTAL KNEE ARTHROPLASTY  04/22/2011   Procedure: TOTAL KNEE ARTHROPLASTY;  Surgeon: Garald Balding, MD;  Location: [place];  Service: Orthopedics;  Laterality: Right;    Current Medications: Current Meds  Medication Sig  . albuterol (PROVENTIL HFA;VENTOLIN HFA) 108 (90 BASE) MCG/ACT inhaler Inhale 2 puffs into the lungs every 6 (six) hours as needed for wheezing or shortness of breath.  . budesonide-formoterol (SYMBICORT) 160-4.5 MCG/ACT inhaler Inhale 2 puffs  into the lungs 2 (two) times daily.  . Calcium Carbonate-Vitamin D (CALCIUM + D) 600-200 MG-UNIT TABS Take 1 tablet by mouth 2 (two) times daily.  Marland Kitchen diltiazem (TIAZAC) 120 MG 24 hr capsule daily.  Marland Kitchen ELIQUIS 5 MG TABS tablet TAKE 1 TABLET BY MOUTH TWO  TIMES DAILY  . flecainide (TAMBOCOR) 50 MG tablet Take 1 tablet (50 mg total) by mouth 2 (two) times daily.  . furosemide (LASIX) 20 MG tablet Take 20 mg by mouth daily.  . montelukast (SINGULAIR) 10 MG tablet Take 10 mg by mouth daily before breakfast.   . Multiple Vitamins tablet Take 1 tablet by mouth daily.  . nitroGLYCERIN (NITROSTAT) 0.4 MG SL tablet Place 1 tablet (0.4 mg total) under the tongue every 5 (five) minutes x 3 doses as needed for chest pain.  . Omega-3 Fatty Acids (FISH OIL) 1000 MG CAPS Take 1,000 mg by mouth 2 (two) times daily.  Marland Kitchen omeprazole (PRILOSEC) 40 MG capsule Take 40 mg by mouth at bedtime.   Marland Kitchen telmisartan (MICARDIS) 80 MG tablet TAKE 1 TABLET BY MOUTH  DAILY  . vitamin E 1000 UNIT capsule Take 1,000 Units by mouth daily.     Allergies:   Aminophylline, Carvedilol, Lisinopril, Tenormin [atenolol], and Zolpidem tartrate   Social History   Socioeconomic History  . Marital status: Married    Spouse name: Not on file  . Number of children: Not on file  . Years of education: Not on file  . Highest education level: Not on file  Occupational History  . Not on file  Tobacco Use  . Smoking status: Former Smoker    Packs/day: 1.50    Years: 30.00    Pack years: 45.00    Types: Cigarettes    Quit date: 04/11/1983    Years since quitting: 36.6  . Smokeless tobacco: Never Used  Vaping Use  . Vaping Use: Never used  Substance and Sexual Activity  . Alcohol use: No    Alcohol/week: 0.0 standard drinks  . Drug use: No  . Sexual activity: Never    Birth control/protection: Condom, None  Other Topics Concern  . Not on file  Social History Narrative  . Not on file   Social Determinants of Health   Financial  Resource Strain:   . Difficulty of Paying Living Expenses: Not on file  Food Insecurity:   . Worried About Charity fundraiser in the Last Year: Not on file  . Ran Out of Food in the Last Year: Not on file  Transportation Needs:   . Lack of Transportation (Medical): Not on file  . Lack of Transportation (Non-Medical): Not on file  Physical Activity:   . Days of Exercise per Week: Not on file  . Minutes of Exercise per Session: Not on file  Stress:   . Feeling of Stress : Not on file  Social Connections:   . Frequency of Communication with Friends and Family: Not on file  . Frequency of Social Gatherings with Friends and Family: Not on file  . Attends Religious Services: Not on file  . Active Member of Clubs or Organizations: Not on file  . Attends Archivist Meetings: Not on file  . Marital Status: Not on file     Family History: The patient's family history includes Alzheimer's disease in his father; Breast cancer in his mother; Crohn's disease in his brother; Heart disease in his brother; Hypertension in his brother. There is no history of Anesthesia problems, Hypotension, Malignant hyperthermia, or Pseudochol deficiency.  ROS:   Please see the history of present illness.    Appetite is been stable.  He monitors his blood pressure at home.  He will be evaluated for sleep apnea by his primary physician.  All other systems reviewed and are negative.  EKGs/Labs/Other Studies Reviewed:    The following studies were reviewed today:  2D Doppler echocardiogram April 2021: IMPRESSIONS    1. Left ventricular ejection fraction, by estimation, is 60 to 65%. The  left ventricle has normal function. The left ventricle has no regional  wall motion abnormalities. There is mild concentric left ventricular  hypertrophy. Left ventricular diastolic  parameters are consistent with Grade I diastolic dysfunction (impaired  relaxation).  2. Right ventricular systolic function is  normal. The right ventricular  size is normal. There is moderately elevated pulmonary artery systolic  pressure.  3. Left atrial size was moderately dilated.  4. Right atrial size was mildly dilated.  5. The mitral valve is grossly normal. Trivial mitral valve  regurgitation. No evidence of mitral stenosis.  6. The aortic valve is tricuspid. Aortic valve regurgitation is not  visualized. Mild aortic valve sclerosis is present, with no evidence of  aortic valve stenosis.  7. The inferior vena cava is normal in size with greater than 50%  respiratory variability, suggesting right atrial pressure of 3 mmHg.   EKG:  EKG performed on 07/26/2019 demonstrates normal sinus rhythm, incomplete right bundle, left axis deviation, and no acute change compared to prior.  Recent Labs: 06/14/2019: ALT 7; BUN 14; Creatinine, Ser 1.07; Hemoglobin 12.5; NT-Pro BNP 372; Platelets 174; Potassium 4.1; Sodium 144  Recent Lipid Panel    Component Value Date/Time   CHOL 159 06/14/2019 1122   TRIG 89 06/14/2019 1122   HDL 40 06/14/2019 1122   CHOLHDL 4.0 06/14/2019 1122   CHOLHDL 3.5 02/28/2017 0218   VLDL 10 02/28/2017 0218   LDLCALC 102 (H) 06/14/2019 1122    Physical Exam:    VS:  BP 124/60   Pulse 63   Ht 5\' 7"  (1.702 m)   Wt 254 lb 3.2 oz (115.3 kg)   SpO2 96%   BMI 39.81 kg/m     Wt Readings from Last 3 Encounters:  11/23/19 254 lb 3.2 oz (115.3 kg)  07/25/19 251 lb 6.4 oz (114 kg)  06/14/19 255 lb (115.7 kg)     GEN: Morbidly obese. No acute distress HEENT: Normal NECK: No JVD. LYMPHATICS: No lymphadenopathy CARDIAC:  RRR without murmur, gallop, or edema. VASCULAR:  Normal Pulses. No bruits. RESPIRATORY:  Clear to auscultation without rales, wheezing or rhonchi  ABDOMEN: Soft, non-tender, non-distended, No  pulsatile mass, MUSCULOSKELETAL: No deformity  SKIN: Warm and dry NEUROLOGIC:  Alert and oriented x 3 PSYCHIATRIC:  Normal affect   ASSESSMENT:    1. PAF (paroxysmal  atrial fibrillation) ([place])   2. Chronic anticoagulation   3. Essential hypertension   4. High risk medication use   5. OSA (obstructive sleep apnea)   6. Ischemic heart disease   7. 2019 novel coronavirus disease (COVID-19)    PLAN:    In order of problems listed above:  1. Based upon clinical exam and most recent EKG, the patient is maintaining sinus rhythm on Tambocor.  Most recent EKG less than 3 months ago does not reveal any significant conduction abnormality. 2. Continue Eliquis 5 mg twice daily.  The most recent BUN and creatinine were normal in April 2021.  Blood work should be repeated again [employer]. 3. Despite the significant episode of blood pressure elevation 2 weeks ago, it is excellent today.  He has had his medications today. 4. Flecainide therapy without complications 5. He may have obstructive sleep apnea and is now going to have a sleep study performed by his primary physician.  I made them aware that lower extremity swelling, dyspnea, fatigue, and atrial fibrillation can be side effects of untreated sleep apnea. 6. The chest pain that he had recently does not sound ischemic and it is a recurrence of pain that he has had in the past that is led to ischemic evaluation.  Plan clinical observation for now. 7. He had active COVID-19 disease.  He has subsequently gotten both doses of an M RNA vaccine.  He should also consider a booster when it becomes recommended.     Medication Adjustments/Labs and Tests Ordered: Current medicines are reviewed at length with the patient today.  Concerns regarding medicines are outlined above.  No orders of the defined types were placed in this encounter.  No orders of the defined types were placed in this encounter.   Patient Instructions  Medication Instructions:  Your physician recommends that you continue on your current medications as directed. Please refer to the Current Medication list given to you today.  *If you need a refill on  your cardiac medications before your next appointment, please call your pharmacy*   Lab Work: None If you have labs (blood work) drawn today and your tests are completely normal, you will receive your results only by: Marland Kitchen MyChart Message (if you have MyChart) OR . A paper copy in the mail If you have any lab test that is abnormal or we need to change your treatment, we will call you to review the results.   Testing/Procedures: None   Follow-Up: At [facility], you and your health needs are our priority.  As part of our continuing mission to provide you with exceptional heart care, we have created designated Provider Care Teams.  These Care Teams include your primary Cardiologist (physician) and Advanced Practice Providers (APPs -  Physician Assistants and Nurse Practitioners) who all work together to provide you with the care you need, when you need it.  We recommend signing up for the patient portal called "MyChart".  Sign up information is provided on this After Visit Summary.  MyChart is used to connect with patients for Virtual Visits (Telemedicine).  Patients are able to view lab/test results, encounter notes, upcoming appointments, etc.  Non-urgent messages can be sent to your provider as well.   To learn more about what you can do with MyChart, go to [email or web address].  Your next appointment:   12 month(s)  The format for your next appointment:   In Person  Provider:   You may see Sinclair Grooms, MD or one of the following Advanced Practice Providers on your designated Care Team:    Truitt Merle, NP  Cecilie Kicks, NP  Kathyrn Drown, NP    Other Instructions      Signed, Sinclair Grooms, MD  11/23/2019 12:36 PM    [place]

## 2019-11-23 ENCOUNTER — Encounter: Payer: Self-pay | Admitting: Interventional Cardiology

## 2019-11-23 ENCOUNTER — Ambulatory Visit: Payer: Medicare Other | Admitting: Interventional Cardiology

## 2019-11-23 ENCOUNTER — Other Ambulatory Visit: Payer: Self-pay

## 2019-11-23 VITALS — BP 124/60 | HR 63 | Ht 67.0 in | Wt 254.2 lb

## 2019-11-23 DIAGNOSIS — G4733 Obstructive sleep apnea (adult) (pediatric): Secondary | ICD-10-CM

## 2019-11-23 DIAGNOSIS — Z79899 Other long term (current) drug therapy: Secondary | ICD-10-CM | POA: Diagnosis not present

## 2019-11-23 DIAGNOSIS — I259 Chronic ischemic heart disease, unspecified: Secondary | ICD-10-CM

## 2019-11-23 DIAGNOSIS — U071 COVID-19: Secondary | ICD-10-CM

## 2019-11-23 DIAGNOSIS — I480 Paroxysmal atrial fibrillation: Secondary | ICD-10-CM | POA: Diagnosis not present

## 2019-11-23 DIAGNOSIS — I10 Essential (primary) hypertension: Secondary | ICD-10-CM | POA: Diagnosis not present

## 2019-11-23 DIAGNOSIS — Z7901 Long term (current) use of anticoagulants: Secondary | ICD-10-CM

## 2019-11-23 NOTE — Patient Instructions (Signed)

## 2019-12-20 ENCOUNTER — Ambulatory Visit: Payer: Medicare Other | Admitting: Podiatry

## 2020-01-10 ENCOUNTER — Other Ambulatory Visit: Payer: Self-pay

## 2020-01-10 ENCOUNTER — Encounter: Payer: Self-pay | Admitting: Podiatry

## 2020-01-10 ENCOUNTER — Ambulatory Visit: Payer: Medicare Other | Admitting: Podiatry

## 2020-01-10 DIAGNOSIS — M722 Plantar fascial fibromatosis: Secondary | ICD-10-CM

## 2020-01-10 NOTE — Progress Notes (Signed)
He presents today chief complaint of painful left heel.  He states he was doing good for a little while and started her again.  Objective: Vital signs are stable he is alert oriented x3.  Pulses are palpable.  Has pain to palpation medial care tubercle of the left heel.  Assessment: Plan fasciitis left heel.  Chronic in nature.  Plan: Discussed etiology pathology conservative surgical therapies at this point I recommended an MRI if this does not improve with this injection.  I went ahead and reinjected him today with Celestone Kenalog dexamethasone combination hopefully this will help alleviate his symptoms.  If this alleviated significantly then we may consider another injection however if not MRI will be necessary.  We did discuss the possibility of surgery.  He is understanding with this.

## 2020-02-07 ENCOUNTER — Other Ambulatory Visit: Payer: Self-pay

## 2020-02-07 ENCOUNTER — Ambulatory Visit: Payer: Medicare Other | Admitting: Podiatry

## 2020-02-07 ENCOUNTER — Encounter: Payer: Self-pay | Admitting: Podiatry

## 2020-02-07 DIAGNOSIS — M722 Plantar fascial fibromatosis: Secondary | ICD-10-CM | POA: Diagnosis not present

## 2020-02-07 MED ORDER — BETAMETHASONE SOD PHOS & ACET 6 (3-3) MG/ML IJ SUSP
3.0000 mg | Freq: Once | INTRAMUSCULAR | Status: AC
  Administered 2020-02-07: 3 mg via INTRAMUSCULAR

## 2020-02-07 MED ORDER — TRIAMCINOLONE ACETONIDE 40 MG/ML IJ SUSP
20.0000 mg | Freq: Once | INTRAMUSCULAR | Status: AC
  Administered 2020-02-07: 20 mg

## 2020-02-07 MED ORDER — DEXAMETHASONE SODIUM PHOSPHATE 120 MG/30ML IJ SOLN
2.0000 mg | Freq: Once | INTRAMUSCULAR | Status: AC
  Administered 2020-02-07: 2 mg via INTRA_ARTICULAR

## 2020-02-07 MED ORDER — BETAMETHASONE SOD PHOS & ACET 6 (3-3) MG/ML IJ SUSP: 3.0000 mg | Freq: Once | INTRAMUSCULAR | Status: DC

## 2020-02-07 NOTE — Progress Notes (Signed)
He presents today he states that we will try 1 more shot because is feeling a little bit better before we go the MRI route.  He is referring to his left heel.  Objective: Vital signs are stable alert oriented x3 pulses are palpable.  There is no erythema edema cellulitis drainage or odor.  Has pain on palpation medial calcaneal tubercle of the left heel.  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  Plan: I injected the area today with dexamethasone Celestone and Kenalog and local anesthetic.  He tolerated procedure well without complications.  Follow-up with me in 1 month.  MRI most likely necessary.

## 2020-02-19 ENCOUNTER — Ambulatory Visit (INDEPENDENT_AMBULATORY_CARE_PROVIDER_SITE_OTHER): Payer: Medicare Other

## 2020-02-19 ENCOUNTER — Ambulatory Visit
Admission: EM | Admit: 2020-02-19 | Discharge: 2020-02-19 | Disposition: A | Discharge status: Home / Self Care | Payer: Medicare Other | Attending: Emergency Medicine | Admitting: Emergency Medicine

## 2020-02-19 ENCOUNTER — Other Ambulatory Visit: Payer: Self-pay

## 2020-02-19 DIAGNOSIS — J22 Unspecified acute lower respiratory infection: Secondary | ICD-10-CM | POA: Diagnosis not present

## 2020-02-19 DIAGNOSIS — R059 Cough, unspecified: Secondary | ICD-10-CM

## 2020-02-19 DIAGNOSIS — R062 Wheezing: Secondary | ICD-10-CM

## 2020-02-19 MED ORDER — PREDNISONE 10 MG PO TABS: ORAL_TABLET | ORAL | 0 refills | Status: DC

## 2020-02-19 MED ORDER — AMOXICILLIN-POT CLAVULANATE 875-125 MG PO TABS: 1.0000 | ORAL_TABLET | Freq: Two times a day (BID) | ORAL | 0 refills | Status: AC

## 2020-02-19 MED ORDER — DOXYCYCLINE HYCLATE 100 MG PO CAPS: 100.0000 mg | ORAL_CAPSULE | Freq: Two times a day (BID) | ORAL | 0 refills | Status: AC

## 2020-02-19 MED ORDER — ALBUTEROL SULFATE HFA 108 (90 BASE) MCG/ACT IN AERS: 1.0000 | INHALATION_SPRAY | Freq: Four times a day (QID) | RESPIRATORY_TRACT | 0 refills | Status: AC | PRN

## 2020-02-19 MED ORDER — BENZONATATE 200 MG PO CAPS: 200.0000 mg | ORAL_CAPSULE | Freq: Three times a day (TID) | ORAL | 0 refills | Status: AC | PRN

## 2020-02-19 MED ORDER — GUAIFENESIN-CODEINE 100-10 MG/5ML PO SOLN: 5.0000 mL | Freq: Three times a day (TID) | ORAL | 0 refills | Status: DC | PRN

## 2020-02-19 MED ORDER — DM-GUAIFENESIN ER 30-600 MG PO TB12: 1.0000 | ORAL_TABLET | Freq: Two times a day (BID) | ORAL | 0 refills | Status: DC

## 2020-02-19 NOTE — Discharge Instructions (Addendum)
No signs of pneumonia Begin doxycycline twice daily for the next 10 days Prednisone taper over the next 6 days-begin with 6 tablets on day 1, decrease by 1 tablet each day until complete-6, 5, 4, 3, 2, 1-take with food in the morning Albuterol inhaler as needed for shortness of breath and wheezing Mucinex DM to help with congestion and cough Tessalon for cough Rest and fluids Follow-up if not improving or worsening

## 2020-02-19 NOTE — ED Triage Notes (Signed)
Patient presents to Urgent Care with complaints of productive cough, chest pain with cough, and right ear pain x 2 weeks. Symptoms worse at night. Reports SOB from exertion. Patient treating with Nyquil with relief. Tested positive for COVID a month ago.   Denies fever, n/v, or diarrhea.

## 2020-02-19 NOTE — ED Provider Notes (Signed)
[facility]    CSN: [id number] Arrival date & time: 02/19/20  1331      History   Chief Complaint No chief complaint on file. Cough  HPI [name] [name] is a 79 y.o. male history of asthma, hypertension, prostate and lung cancer, presenting today for evaluation of cough.  Patient reports over the past 2 weeks he has had a cough congestion chest discomfort and significant mucus production.  Is also noted some right ear pain.  Symptoms worse at night.  Has had some shortness of breath especially with exertion.  Using NyQuil without relief.  Denies any fevers nausea vomiting or diarrhea. Negative COIVD test 1 month ago, prior to onset of symptoms.  Prior Covid infection in September 2020.  HPI  Past Medical History:  Diagnosis Date  . Angina    chest pain- cardiac cath. followed in 2010, record available ,  told then that he should f/u /w Dr. Marisue Humble    . Arthritis    R knee, back    . Asthma    uses  singulair  . Cancer [facility])    prostate, melanoma- 2010, excision    . Chronic kidney disease    prostate cancer - surg. removal- 1996  . Colon polyps   . Diverticulitis   . Dysrhythmia    palpitations, followed by Dr.Ehinger, seen in prep for surgery on 03/27/2011   . GERD (gastroesophageal reflux disease)   . Hepatitis    jaundice- many yrs. ago  . Hiatal hernia   . Hypertension   . Melanoma ([place])    Face  . OSA (obstructive sleep apnea) 04/11/2015   Mild to moderate OSA with AHI 13.3/hr overall and AHI 36.8/hr during REM sleep.  Oxygen saturations were as low as 86% during respiratory events.  . Pneumonia    hosp. 20 yrs. ago  . Prostate cancer ([place]) 1995  . Sleep apnea    study done, 10 yrs. ago, told that he needed  CPAP but  never used     Patient Active Problem List   Diagnosis Date Noted  . Allergic rhinitis 06/14/2019  . Beat, premature ventricular 06/14/2019  . H/O adenomatous polyp of colon 06/14/2019  . 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 Low back pain 10/05/2018  . Left foot pain 08/12/2018  . Constipation   . Lobar pneumonia ([place]) 02/27/2017  . COPD with acute exacerbation ([place]) 02/27/2017  . GERD (gastroesophageal reflux disease) 02/27/2017  . CKD (chronic kidney disease), stage III ([place]) 02/27/2017  . Acute and chronic respiratory failure with hypoxia ([place]) 02/27/2017  . Anticoagulated 03/19/2016  . Otorrhagia of left ear 03/19/2016  . Anxiety 10/30/2015  . Dyspnea 08/20/2015  . Solitary pulmonary nodule 05/18/2015  . OSA (obstructive sleep apnea) 04/11/2015  . Chronic anticoagulation 03/17/2015  . Asthma, chronic obstructive, with acute exacerbation ([place]) 02/26/2015  . PAF (paroxysmal atrial fibrillation) ([place]) 01/26/2015  . Actinic keratoses 11/23/2014  . Awareness of heartbeats 11/23/2014  . Suspected congestive heart failure 05/05/2014  . Suspected sleep apnea 05/05/2014  . 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 Postoperative anemia due to acute blood loss 04/24/2011  . Osteoarthritis of knee 04/23/2011  . Hypertension 04/23/2011  . History of prostate cancer 04/23/2011  . Melanoma ([place]) 04/23/2011  . Hiatal hernia 04/23/2011  . Asthma 04/23/2011  . Obesity, Class II, BMI 35-39.9, with comorbidity ([place]) 04/23/2011    Past Surgical History:  Procedure Laterality Date  . CARDIAC  CATHETERIZATION     2010  . CARDIAC CATHETERIZATION N/A 10/22/2015   Procedure: Left Heart Cath and Coronary Angiography;  Surgeon: Nelva Bush, MD;  Location: [facility];  Service: Cardiovascular;  Laterality: N/A;  . FRACTURE SURGERY     L wrist, hardware- 1982  . JOINT REPLACEMENT     L knee, 2009  . LEFT HEART CATHETERIZATION WITH CORONARY ANGIOGRAM N/A 09/08/2011   Procedure: LEFT HEART CATHETERIZATION WITH CORONARY ANGIOGRAM;  Surgeon: Sinclair Grooms, MD;  Location: [facility];  Service: Cardiovascular;  Laterality: N/A;  . PROSTATECTOMY     for ca  . TONSILLECTOMY     as  child  . TOTAL KNEE ARTHROPLASTY  04/22/2011   Procedure: TOTAL KNEE ARTHROPLASTY;  Surgeon: Garald Balding, MD;  Location: [facility];  Service: Orthopedics;  Laterality: Right;       Home Medications    Prior to Admission medications   Medication Sig Start Date End Date Taking? Authorizing Provider  albuterol (VENTOLIN HFA) 108 (90 Base) MCG/ACT inhaler Inhale 1-2 puffs into the lungs every 6 (six) hours as needed for wheezing or shortness of breath. 02/19/20   Mili Piltz C, PA-C  amoxicillin-clavulanate (AUGMENTIN) 875-125 MG tablet Take 1 tablet by mouth every 12 (twelve) hours for 7 days. 02/19/20 02/26/20  Kandis Henry C, PA-C  benzonatate (TESSALON) 200 MG capsule Take 1 capsule (200 mg total) by mouth 3 (three) times daily as needed for up to 7 days for cough. 02/19/20 02/26/20  Lynsey Ange C, PA-C  budesonide-formoterol (SYMBICORT) 160-4.5 MCG/ACT inhaler Inhale 2 puffs into the lungs 2 (two) times daily. 05/22/17   Juanito Doom, MD  Calcium Carbonate-Vitamin D (CALCIUM + D) 600-200 MG-UNIT TABS Take 1 tablet by mouth 2 (two) times daily.    [provider]  dextromethorphan-guaiFENesin (MUCINEX DM) 30-600 MG 12hr tablet Take 1 tablet by mouth 2 (two) times daily. 02/19/20   Tacarra Justo C, PA-C  diltiazem (TIAZAC) 120 MG 24 hr capsule daily.    [provider]  doxycycline (VIBRAMYCIN) 100 MG capsule Take 1 capsule (100 mg total) by mouth 2 (two) times daily for 10 days. 02/19/20 02/29/20  Kensleigh Gates C, PA-C  ELIQUIS 5 MG TABS tablet TAKE 1 TABLET BY MOUTH TWO  TIMES DAILY 02/24/19   Belva Crome, MD  flecainide (TAMBOCOR) 50 MG tablet Take 1 tablet (50 mg total) by mouth 2 (two) times daily. 10/19/19   Belva Crome, MD  furosemide (LASIX) 20 MG tablet Take 20 mg by mouth daily. 06/02/18   [provider]  guaiFENesin-codeine 100-10 MG/5ML syrup Take 5-10 mLs by mouth 3 (three) times daily as needed for cough. 02/19/20   Wilgus Deyton,  [place] Ndiaye C, PA-C  montelukast (SINGULAIR) 10 MG tablet Take 10 mg by mouth daily before breakfast.     [provider]  Multiple Vitamins tablet Take 1 tablet by mouth daily.    [provider]  nitroGLYCERIN (NITROSTAT) 0.4 MG SL tablet Place 1 tablet (0.4 mg total) under the tongue every 5 (five) minutes x 3 doses as needed for chest pain. 03/18/15   Erlene Quan, PA-C  Omega-3 Fatty Acids (FISH OIL) 1000 MG CAPS Take 1,000 mg by mouth 2 (two) times daily.    [provider]  omeprazole (PRILOSEC) 40 MG capsule Take 40 mg by mouth at bedtime.     [provider]  predniSONE (DELTASONE) 10 MG tablet Begin with 6 tabs on day 1,  5 tab on day 2, 4 tab on day 3, 3 tab on day 4, 2 tab on day 5, 1 tab on day 6-take with food 02/19/20   Fran Neiswonger, [employer] C, PA-C  telmisartan (MICARDIS) 80 MG tablet TAKE 1 TABLET BY MOUTH  DAILY 10/01/18   Belva Crome, MD  vitamin E 1000 UNIT capsule Take 1,000 Units by mouth daily.    [provider]    Family History Family History  Problem Relation Age of Onset  . Crohn's disease Brother   . Breast cancer Mother   . Alzheimer's disease Father   . Heart disease Brother   . Hypertension Brother   . Anesthesia problems Neg Hx   . Hypotension Neg Hx   . Malignant hyperthermia Neg Hx   . Pseudochol deficiency Neg Hx     Social History Social History   Tobacco Use  . Smoking status: Former Smoker    Packs/day: 1.50    Years: 30.00    Pack years: 45.00    Types: Cigarettes    Quit date: 04/11/1983    Years since quitting: 36.8  . Smokeless tobacco: Never Used  Vaping Use  . Vaping Use: Never used  Substance Use Topics  . Alcohol use: No    Alcohol/week: 0.0 standard drinks  . Drug use: No     Allergies   Aminophylline, Carvedilol, Lisinopril, Tenormin [atenolol], and Zolpidem tartrate   Review of Systems Review of Systems  Constitutional: Negative for activity change, appetite change, chills, fatigue  and fever.  HENT: Positive for congestion and rhinorrhea. Negative for ear pain, sinus pressure, sore throat and trouble swallowing.   Eyes: Negative for discharge and redness.  Respiratory: Positive for cough and shortness of breath. Negative for chest tightness.   Cardiovascular: Negative for chest pain.  Gastrointestinal: Negative for abdominal pain, diarrhea, nausea and vomiting.  Musculoskeletal: Negative for myalgias.  Skin: Negative for rash.  Neurological: Negative for dizziness, light-headedness and headaches.     Physical Exam Triage Vital Signs ED Triage Vitals  Enc Vitals Group     BP      Pulse      Resp      Temp      Temp src      SpO2      Weight      Height      Head Circumference      Peak Flow      Pain Score      Pain Loc      Pain Edu?      Excl. in [place]?    No data found.  Updated Vital Signs BP (S) (!) 149/71 (BP Location: Left Arm)   Pulse 63   Temp 97.8 F (36.6 C) (Oral)   Resp 18   SpO2 95%   Visual Acuity Right Eye Distance:   Left Eye Distance:   Bilateral Distance:    Right Eye Near:   Left Eye Near:    Bilateral Near:     Physical Exam Vitals and nursing note reviewed.  Constitutional:      Appearance: He is well-developed and well-nourished.     Comments: No acute distress  HENT:     Head: Normocephalic and atraumatic.     Ears:     Comments: Bilateral ears without tenderness to palpation of external auricle, tragus and mastoid, EAC's without erythema or swelling, TM's with good bony landmarks and cone of light. Non erythematous.     Nose:  Nose normal.     Mouth/Throat:     Comments: Oral mucosa pink and moist, no tonsillar enlargement or exudate. Posterior pharynx patent and nonerythematous, no uvula deviation or swelling. Normal phonation. Eyes:     Conjunctiva/sclera: Conjunctivae normal.  Cardiovascular:     Rate and Rhythm: Normal rate and regular rhythm.  Pulmonary:     Effort: Pulmonary effort is normal. No  respiratory distress.     Breath sounds: Wheezing present.     Comments: Breathing comfortably at rest, mild expiratory wheezing noted throughout bilateral lung fields Abdominal:     General: There is no distension.  Musculoskeletal:        General: Normal range of motion.     Cervical back: Neck supple.  Skin:    General: Skin is warm and dry.  Neurological:     Mental Status: He is alert and oriented to person, place, and time.  Psychiatric:        Mood and Affect: Mood and affect normal.      UC Treatments / Results  Labs (all labs ordered are listed, but only abnormal results are displayed) Labs Reviewed - No data to display  EKG   Radiology DG Chest 2 View  Result Date: 02/19/2020 CLINICAL DATA:  Cough for 2 weeks. Wheezing. History of lung cancer. EXAM: CHEST - 2 VIEW COMPARISON:  Most recent radiograph 07/23/2018. FINDINGS: Stable upper normal heart size. Unchanged mediastinal contours. There is aortic atherosclerosis. Chronic hyperinflation with mild central bronchial thickening. Streaky and patchy opacities in the suprahilar right lung. No pulmonary edema. No pleural fluid or pneumothorax. Thoracic spondylosis. No acute osseous abnormalities are seen. IMPRESSION: 1. Streaky and patchy opacities in the suprahilar right lung, suspicious for pneumonia. 2. Chronic hyperinflation and bronchial thickening, imaging findings suggest COPD. Electronically Signed   By: Keith Rake M.D.   On: 02/19/2020 15:09    Procedures Procedures (including critical care time)  Medications Ordered in UC Medications - No data to display  Initial Impression / Assessment and Plan / UC Course  I have reviewed the triage vital signs and the nursing notes.  Pertinent labs & imaging results that were available during my care of the patient were reviewed by me and considered in my medical decision making (see chart for details).     X-ray with area in right upper lung suspicious for  pneumonia, treating with Augmentin and doxycycline, prednisone course x6 days, albuterol inhaler as needed for wheezing, Tessalon for cough during the day, Robitussin AC at bedtime, support with Mucinex for further congestion and cough relief.  Push fluids.  Discussed strict return precautions. Patient verbalized understanding and is agreeable with plan.  Final Clinical Impressions(s) / UC Diagnoses   Final diagnoses:  Lower respiratory infection (e.g., bronchitis, pneumonia, pneumonitis, pulmonitis)     Discharge Instructions     No signs of pneumonia Begin doxycycline twice daily for the next 10 days Prednisone taper over the next 6 days-begin with 6 tablets on day 1, decrease by 1 tablet each day until complete-6, 5, 4, 3, 2, 1-take with food in the morning Albuterol inhaler as needed for shortness of breath and wheezing Mucinex DM to help with congestion and cough Tessalon for cough Rest and fluids Follow-up if not improving or worsening    ED Prescriptions    Medication Sig Dispense Auth. Provider   predniSONE (DELTASONE) 10 MG tablet Begin with 6 tabs on day 1, 5 tab on day 2, 4 tab on day 3, 3  tab on day 4, 2 tab on day 5, 1 tab on day 6-take with food 21 tablet Aubrianne Molyneux C, PA-C   doxycycline (VIBRAMYCIN) 100 MG capsule Take 1 capsule (100 mg total) by mouth 2 (two) times daily for 10 days. 20 capsule Haya Hemler C, PA-C   benzonatate (TESSALON) 200 MG capsule Take 1 capsule (200 mg total) by mouth 3 (three) times daily as needed for up to 7 days for cough. 28 capsule Malissie Musgrave C, PA-C   dextromethorphan-guaiFENesin (MUCINEX DM) 30-600 MG 12hr tablet Take 1 tablet by mouth 2 (two) times daily. 20 tablet Dwayne Bulkley C, PA-C   albuterol (VENTOLIN HFA) 108 (90 Base) MCG/ACT inhaler Inhale 1-2 puffs into the lungs every 6 (six) hours as needed for wheezing or shortness of breath. 18 g Luvada Salamone C, PA-C   guaiFENesin-codeine 100-10 MG/5ML syrup Take 5-10  mLs by mouth 3 (three) times daily as needed for cough. 120 mL Reyce Lubeck C, PA-C   amoxicillin-clavulanate (AUGMENTIN) 875-125 MG tablet Take 1 tablet by mouth every 12 (twelve) hours for 7 days. 14 tablet Lovell Roe, [place] C, PA-C     PDMP not reviewed this encounter.   Janith Lima, [place] 02/19/20 1516

## 2020-03-19 DIAGNOSIS — C44329 Squamous cell carcinoma of skin of other parts of face: Secondary | ICD-10-CM | POA: Diagnosis not present

## 2020-03-19 DIAGNOSIS — L578 Other skin changes due to chronic exposure to nonionizing radiation: Secondary | ICD-10-CM | POA: Diagnosis not present

## 2020-03-19 DIAGNOSIS — L821 Other seborrheic keratosis: Secondary | ICD-10-CM | POA: Diagnosis not present

## 2020-03-19 DIAGNOSIS — L570 Actinic keratosis: Secondary | ICD-10-CM | POA: Diagnosis not present

## 2020-03-21 DIAGNOSIS — C4339 Malignant melanoma of other parts of face: Secondary | ICD-10-CM | POA: Diagnosis not present

## 2020-03-21 DIAGNOSIS — R17 Unspecified jaundice: Secondary | ICD-10-CM | POA: Diagnosis not present

## 2020-03-21 DIAGNOSIS — D491 Neoplasm of unspecified behavior of respiratory system: Secondary | ICD-10-CM | POA: Diagnosis not present

## 2020-03-21 DIAGNOSIS — Z923 Personal history of irradiation: Secondary | ICD-10-CM | POA: Diagnosis not present

## 2020-03-21 DIAGNOSIS — C4A9 Merkel cell carcinoma, unspecified: Secondary | ICD-10-CM | POA: Diagnosis not present

## 2020-03-21 DIAGNOSIS — Z8616 Personal history of COVID-19: Secondary | ICD-10-CM | POA: Diagnosis not present

## 2020-03-21 DIAGNOSIS — C3411 Malignant neoplasm of upper lobe, right bronchus or lung: Secondary | ICD-10-CM | POA: Diagnosis not present

## 2020-03-21 DIAGNOSIS — C4A30 Merkel cell carcinoma of unspecified part of face: Secondary | ICD-10-CM | POA: Diagnosis not present

## 2020-03-21 DIAGNOSIS — C4330 Malignant melanoma of unspecified part of face: Secondary | ICD-10-CM | POA: Diagnosis not present

## 2020-03-21 DIAGNOSIS — Z9889 Other specified postprocedural states: Secondary | ICD-10-CM | POA: Diagnosis not present

## 2020-03-21 DIAGNOSIS — K769 Liver disease, unspecified: Secondary | ICD-10-CM | POA: Diagnosis not present

## 2020-03-29 DIAGNOSIS — J45909 Unspecified asthma, uncomplicated: Secondary | ICD-10-CM | POA: Diagnosis not present

## 2020-03-29 DIAGNOSIS — I4891 Unspecified atrial fibrillation: Secondary | ICD-10-CM | POA: Diagnosis not present

## 2020-03-29 DIAGNOSIS — K219 Gastro-esophageal reflux disease without esophagitis: Secondary | ICD-10-CM | POA: Diagnosis not present

## 2020-03-29 DIAGNOSIS — I10 Essential (primary) hypertension: Secondary | ICD-10-CM | POA: Diagnosis not present

## 2020-03-29 DIAGNOSIS — J449 Chronic obstructive pulmonary disease, unspecified: Secondary | ICD-10-CM | POA: Diagnosis not present

## 2020-03-29 DIAGNOSIS — C3411 Malignant neoplasm of upper lobe, right bronchus or lung: Secondary | ICD-10-CM | POA: Diagnosis not present

## 2020-04-11 DIAGNOSIS — R0600 Dyspnea, unspecified: Secondary | ICD-10-CM | POA: Diagnosis not present

## 2020-04-11 DIAGNOSIS — J984 Other disorders of lung: Secondary | ICD-10-CM | POA: Diagnosis not present

## 2020-04-11 DIAGNOSIS — S2231XA Fracture of one rib, right side, initial encounter for closed fracture: Secondary | ICD-10-CM | POA: Diagnosis not present

## 2020-04-11 DIAGNOSIS — R918 Other nonspecific abnormal finding of lung field: Secondary | ICD-10-CM | POA: Diagnosis not present

## 2020-04-11 DIAGNOSIS — Z923 Personal history of irradiation: Secondary | ICD-10-CM | POA: Diagnosis not present

## 2020-04-11 DIAGNOSIS — C3411 Malignant neoplasm of upper lobe, right bronchus or lung: Secondary | ICD-10-CM | POA: Diagnosis not present

## 2020-04-11 DIAGNOSIS — I700 Atherosclerosis of aorta: Secondary | ICD-10-CM | POA: Diagnosis not present

## 2020-04-11 DIAGNOSIS — J9811 Atelectasis: Secondary | ICD-10-CM | POA: Diagnosis not present

## 2020-04-11 DIAGNOSIS — K760 Fatty (change of) liver, not elsewhere classified: Secondary | ICD-10-CM | POA: Diagnosis not present

## 2020-04-11 DIAGNOSIS — I2510 Atherosclerotic heart disease of native coronary artery without angina pectoris: Secondary | ICD-10-CM | POA: Diagnosis not present

## 2020-04-24 DIAGNOSIS — H10413 Chronic giant papillary conjunctivitis, bilateral: Secondary | ICD-10-CM | POA: Diagnosis not present

## 2020-05-01 ENCOUNTER — Other Ambulatory Visit: Payer: Self-pay

## 2020-05-01 ENCOUNTER — Ambulatory Visit (INDEPENDENT_AMBULATORY_CARE_PROVIDER_SITE_OTHER): Payer: Medicare Other | Admitting: Podiatry

## 2020-05-01 ENCOUNTER — Encounter: Payer: Self-pay | Admitting: Podiatry

## 2020-05-01 DIAGNOSIS — M722 Plantar fascial fibromatosis: Secondary | ICD-10-CM | POA: Diagnosis not present

## 2020-05-01 DIAGNOSIS — B351 Tinea unguium: Secondary | ICD-10-CM

## 2020-05-01 DIAGNOSIS — M79676 Pain in unspecified toe(s): Secondary | ICD-10-CM

## 2020-05-01 MED ORDER — TRIAMCINOLONE ACETONIDE 40 MG/ML IJ SUSP
20.0000 mg | Freq: Once | INTRAMUSCULAR | Status: AC
  Administered 2020-05-01: 20 mg

## 2020-05-01 NOTE — Progress Notes (Signed)
He presents today chief complaint of a painful toenail hallux left.  He is also complaining of pain to his left heel.  Objective: Vital signs are stable alert oriented x3.  Toenail does demonstrate mild erythema no erythema no cellulitis drainage or odor.  Tenderness on palpation.  He also has tenderness on palpation medial calcaneal tubercle of the left heel and just distal to the insertion site on the calcaneus.  Assessment: Planter fasciitis neuritis.  Ingrown toenail.  Plan: Debrided the nail for him today also injected his heel today 20 mg Kenalog 5 mg Marcaine tolerated procedure well without complications follow-up with him in 1 month if necessary.

## 2020-05-07 DIAGNOSIS — I10 Essential (primary) hypertension: Secondary | ICD-10-CM | POA: Diagnosis not present

## 2020-05-07 DIAGNOSIS — I4891 Unspecified atrial fibrillation: Secondary | ICD-10-CM | POA: Diagnosis not present

## 2020-05-07 DIAGNOSIS — J449 Chronic obstructive pulmonary disease, unspecified: Secondary | ICD-10-CM | POA: Diagnosis not present

## 2020-05-07 DIAGNOSIS — C3411 Malignant neoplasm of upper lobe, right bronchus or lung: Secondary | ICD-10-CM | POA: Diagnosis not present

## 2020-05-07 DIAGNOSIS — K219 Gastro-esophageal reflux disease without esophagitis: Secondary | ICD-10-CM | POA: Diagnosis not present

## 2020-05-07 DIAGNOSIS — J45909 Unspecified asthma, uncomplicated: Secondary | ICD-10-CM | POA: Diagnosis not present

## 2020-05-24 DIAGNOSIS — R29818 Other symptoms and signs involving the nervous system: Secondary | ICD-10-CM | POA: Diagnosis not present

## 2020-05-24 DIAGNOSIS — R0600 Dyspnea, unspecified: Secondary | ICD-10-CM | POA: Diagnosis not present

## 2020-05-24 DIAGNOSIS — Z87891 Personal history of nicotine dependence: Secondary | ICD-10-CM | POA: Diagnosis not present

## 2020-05-24 DIAGNOSIS — R911 Solitary pulmonary nodule: Secondary | ICD-10-CM | POA: Diagnosis not present

## 2020-05-24 DIAGNOSIS — C3411 Malignant neoplasm of upper lobe, right bronchus or lung: Secondary | ICD-10-CM | POA: Diagnosis not present

## 2020-05-25 DIAGNOSIS — J309 Allergic rhinitis, unspecified: Secondary | ICD-10-CM | POA: Diagnosis not present

## 2020-05-25 DIAGNOSIS — K219 Gastro-esophageal reflux disease without esophagitis: Secondary | ICD-10-CM | POA: Diagnosis not present

## 2020-05-25 DIAGNOSIS — G4733 Obstructive sleep apnea (adult) (pediatric): Secondary | ICD-10-CM | POA: Diagnosis not present

## 2020-05-25 DIAGNOSIS — I10 Essential (primary) hypertension: Secondary | ICD-10-CM | POA: Diagnosis not present

## 2020-05-25 DIAGNOSIS — I700 Atherosclerosis of aorta: Secondary | ICD-10-CM | POA: Diagnosis not present

## 2020-05-25 DIAGNOSIS — D6869 Other thrombophilia: Secondary | ICD-10-CM | POA: Diagnosis not present

## 2020-05-25 DIAGNOSIS — I4891 Unspecified atrial fibrillation: Secondary | ICD-10-CM | POA: Diagnosis not present

## 2020-05-25 DIAGNOSIS — Z85820 Personal history of malignant melanoma of skin: Secondary | ICD-10-CM | POA: Diagnosis not present

## 2020-05-25 DIAGNOSIS — Z85118 Personal history of other malignant neoplasm of bronchus and lung: Secondary | ICD-10-CM | POA: Diagnosis not present

## 2020-05-25 DIAGNOSIS — J45909 Unspecified asthma, uncomplicated: Secondary | ICD-10-CM | POA: Diagnosis not present

## 2020-05-25 DIAGNOSIS — J449 Chronic obstructive pulmonary disease, unspecified: Secondary | ICD-10-CM | POA: Diagnosis not present

## 2020-05-28 DIAGNOSIS — C3411 Malignant neoplasm of upper lobe, right bronchus or lung: Secondary | ICD-10-CM | POA: Diagnosis not present

## 2020-05-28 DIAGNOSIS — M545 Low back pain, unspecified: Secondary | ICD-10-CM | POA: Diagnosis not present

## 2020-05-28 DIAGNOSIS — K219 Gastro-esophageal reflux disease without esophagitis: Secondary | ICD-10-CM | POA: Diagnosis not present

## 2020-05-28 DIAGNOSIS — I10 Essential (primary) hypertension: Secondary | ICD-10-CM | POA: Diagnosis not present

## 2020-05-28 DIAGNOSIS — S2241XA Multiple fractures of ribs, right side, initial encounter for closed fracture: Secondary | ICD-10-CM | POA: Diagnosis not present

## 2020-05-28 DIAGNOSIS — J45909 Unspecified asthma, uncomplicated: Secondary | ICD-10-CM | POA: Diagnosis not present

## 2020-05-28 DIAGNOSIS — J449 Chronic obstructive pulmonary disease, unspecified: Secondary | ICD-10-CM | POA: Diagnosis not present

## 2020-05-28 DIAGNOSIS — I4891 Unspecified atrial fibrillation: Secondary | ICD-10-CM | POA: Diagnosis not present

## 2020-06-12 ENCOUNTER — Other Ambulatory Visit: Payer: Self-pay | Admitting: *Deleted

## 2020-06-12 ENCOUNTER — Telehealth: Payer: Self-pay

## 2020-06-12 MED ORDER — FLECAINIDE ACETATE 50 MG PO TABS: 50.0000 mg | ORAL_TABLET | Freq: Two times a day (BID) | ORAL | 2 refills | Status: DC

## 2020-06-12 NOTE — Telephone Encounter (Signed)
**Note De-Identified [employer] Obfuscation** Letter received from [facility] stating that they have extended the pts assistance for Eliquis until 03/09/2021. BMSPAF Case#:[id number]  The letter states that they have notified the pt of this extension as well.

## 2020-06-14 MED ORDER — FLECAINIDE ACETATE 50 MG PO TABS: 50.0000 mg | ORAL_TABLET | Freq: Two times a day (BID) | ORAL | 1 refills | Status: DC

## 2020-06-14 NOTE — Addendum Note (Signed)
Addended by: Juventino Slovak on: 06/14/2020 12:01 PM   Modules accepted: Orders

## 2020-06-18 DIAGNOSIS — C44329 Squamous cell carcinoma of skin of other parts of face: Secondary | ICD-10-CM | POA: Diagnosis not present

## 2020-06-18 DIAGNOSIS — L820 Inflamed seborrheic keratosis: Secondary | ICD-10-CM | POA: Diagnosis not present

## 2020-06-18 DIAGNOSIS — L821 Other seborrheic keratosis: Secondary | ICD-10-CM | POA: Diagnosis not present

## 2020-06-18 DIAGNOSIS — L570 Actinic keratosis: Secondary | ICD-10-CM | POA: Diagnosis not present

## 2020-06-19 DIAGNOSIS — Z85821 Personal history of Merkel cell carcinoma: Secondary | ICD-10-CM | POA: Diagnosis not present

## 2020-06-19 DIAGNOSIS — C3411 Malignant neoplasm of upper lobe, right bronchus or lung: Secondary | ICD-10-CM | POA: Diagnosis not present

## 2020-06-19 DIAGNOSIS — Z85820 Personal history of malignant melanoma of skin: Secondary | ICD-10-CM | POA: Diagnosis not present

## 2020-06-19 DIAGNOSIS — Z923 Personal history of irradiation: Secondary | ICD-10-CM | POA: Diagnosis not present

## 2020-06-20 DIAGNOSIS — C3411 Malignant neoplasm of upper lobe, right bronchus or lung: Secondary | ICD-10-CM | POA: Diagnosis not present

## 2020-06-20 DIAGNOSIS — I10 Essential (primary) hypertension: Secondary | ICD-10-CM | POA: Diagnosis not present

## 2020-06-20 DIAGNOSIS — I4891 Unspecified atrial fibrillation: Secondary | ICD-10-CM | POA: Diagnosis not present

## 2020-06-20 DIAGNOSIS — K219 Gastro-esophageal reflux disease without esophagitis: Secondary | ICD-10-CM | POA: Diagnosis not present

## 2020-06-20 DIAGNOSIS — Z85118 Personal history of other malignant neoplasm of bronchus and lung: Secondary | ICD-10-CM | POA: Diagnosis not present

## 2020-06-20 DIAGNOSIS — J449 Chronic obstructive pulmonary disease, unspecified: Secondary | ICD-10-CM | POA: Diagnosis not present

## 2020-06-20 DIAGNOSIS — J45909 Unspecified asthma, uncomplicated: Secondary | ICD-10-CM | POA: Diagnosis not present

## 2020-07-23 DIAGNOSIS — L821 Other seborrheic keratosis: Secondary | ICD-10-CM | POA: Diagnosis not present

## 2020-07-23 DIAGNOSIS — L820 Inflamed seborrheic keratosis: Secondary | ICD-10-CM | POA: Diagnosis not present

## 2020-07-23 DIAGNOSIS — L570 Actinic keratosis: Secondary | ICD-10-CM | POA: Diagnosis not present

## 2020-07-23 DIAGNOSIS — L578 Other skin changes due to chronic exposure to nonionizing radiation: Secondary | ICD-10-CM | POA: Diagnosis not present

## 2020-08-05 ENCOUNTER — Other Ambulatory Visit: Payer: Self-pay

## 2020-08-05 ENCOUNTER — Encounter: Payer: Self-pay | Admitting: Emergency Medicine

## 2020-08-05 ENCOUNTER — Ambulatory Visit
Admission: EM | Admit: 2020-08-05 | Discharge: 2020-08-05 | Disposition: A | Discharge status: Home / Self Care | Payer: Medicare Other | Attending: Emergency Medicine | Admitting: Emergency Medicine

## 2020-08-05 ENCOUNTER — Ambulatory Visit (INDEPENDENT_AMBULATORY_CARE_PROVIDER_SITE_OTHER): Payer: Medicare Other

## 2020-08-05 DIAGNOSIS — Z7689 Persons encountering health services in other specified circumstances: Secondary | ICD-10-CM | POA: Diagnosis not present

## 2020-08-05 DIAGNOSIS — J209 Acute bronchitis, unspecified: Secondary | ICD-10-CM

## 2020-08-05 DIAGNOSIS — R0602 Shortness of breath: Secondary | ICD-10-CM | POA: Diagnosis not present

## 2020-08-05 DIAGNOSIS — R051 Acute cough: Secondary | ICD-10-CM | POA: Diagnosis not present

## 2020-08-05 DIAGNOSIS — R059 Cough, unspecified: Secondary | ICD-10-CM | POA: Diagnosis not present

## 2020-08-05 DIAGNOSIS — R062 Wheezing: Secondary | ICD-10-CM | POA: Diagnosis not present

## 2020-08-05 MED ORDER — METHYLPREDNISOLONE SODIUM SUCC 125 MG IJ SOLR
80.0000 mg | Freq: Once | INTRAMUSCULAR | Status: AC
Start: 2020-08-05 — End: 2020-08-05
  Administered 2020-08-05: 80 mg via INTRAMUSCULAR

## 2020-08-05 MED ORDER — DOXYCYCLINE HYCLATE 100 MG PO CAPS: 100.0000 mg | ORAL_CAPSULE | Freq: Two times a day (BID) | ORAL | 0 refills | Status: DC

## 2020-08-05 MED ORDER — PREDNISONE 20 MG PO TABS: 40.0000 mg | ORAL_TABLET | Freq: Every day | ORAL | 0 refills | Status: AC

## 2020-08-05 MED ORDER — BENZONATATE 200 MG PO CAPS: 200.0000 mg | ORAL_CAPSULE | Freq: Three times a day (TID) | ORAL | 0 refills | Status: DC | PRN

## 2020-08-05 MED ORDER — GUAIFENESIN-CODEINE 100-10 MG/5ML PO SOLN: 5.0000 mL | Freq: Every evening | ORAL | 0 refills | Status: DC | PRN

## 2020-08-05 NOTE — ED Provider Notes (Signed)
[facility]    CSN: [id number] Arrival date & time: 08/05/20  0951      History   Chief Complaint Chief Complaint  Patient presents with  . Cough    HPI [name] [name] is a 80 y.o. male history of asthma, hypertension, paroxysmal A. fib, GERD, prior lung cancer presenting today for evaluation of cough congestion and shortness of breath.  Symptoms began 3 days ago, worsening cough in the past day.  Reports history of pneumonia feeling similar.  Using OTC meds without relief.  HPI  Past Medical History:  Diagnosis Date  . Angina    chest pain- cardiac cath. followed in 2010, record available ,  told then that he should f/u /w Dr. Marisue Humble    . Arthritis    R knee, back    . Asthma    uses  singulair  . Cancer [facility])    prostate, melanoma- 2010, excision    . Chronic kidney disease    prostate cancer - surg. removal- 1996  . Colon polyps   . Diverticulitis   . Dysrhythmia    palpitations, followed by Dr.Ehinger, seen in prep for surgery on 03/27/2011   . GERD (gastroesophageal reflux disease)   . Hepatitis    jaundice- many yrs. ago  . Hiatal hernia   . Hypertension   . Melanoma ([place])    Face  . OSA (obstructive sleep apnea) 04/11/2015   Mild to moderate OSA with AHI 13.3/hr overall and AHI 36.8/hr during REM sleep.  Oxygen saturations were as low as 86% during respiratory events.  . Pneumonia    hosp. 20 yrs. ago  . Prostate cancer ([place]) 1995  . Sleep apnea    study done, 10 yrs. ago, told that he needed  CPAP but  never used     Patient Active Problem List   Diagnosis Date Noted  . Allergic rhinitis 06/14/2019  . Beat, premature ventricular 06/14/2019  . H/O adenomatous polyp of colon 06/14/2019  . Malignant neoplasm of upper lobe of right lung ([place]) 01/18/2019  . Low back pain 10/05/2018  . Left foot pain 08/12/2018  . Constipation   . Lobar pneumonia ([place]) 02/27/2017  . COPD with acute exacerbation ([place]) 02/27/2017  . GERD (gastroesophageal  reflux disease) 02/27/2017  . CKD (chronic kidney disease), stage III ([place]) 02/27/2017  . Acute and chronic respiratory failure with hypoxia ([place]) 02/27/2017  . Anticoagulated 03/19/2016  . Otorrhagia of left ear 03/19/2016  . Anxiety 10/30/2015  . Dyspnea 08/20/2015  . Solitary pulmonary nodule 05/18/2015  . OSA (obstructive sleep apnea) 04/11/2015  . Chronic anticoagulation 03/17/2015  . Asthma, chronic obstructive, with acute exacerbation ([place]) 02/26/2015  . PAF (paroxysmal atrial fibrillation) ([place]) 01/26/2015  . Actinic keratoses 11/23/2014  . Awareness of heartbeats 11/23/2014  . Suspected congestive heart failure 05/05/2014  . Suspected sleep apnea 05/05/2014  . 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 Postoperative anemia due to acute blood loss 04/24/2011  . Osteoarthritis of knee 04/23/2011  . Hypertension 04/23/2011  . History of prostate cancer 04/23/2011  . Melanoma ([place]) 04/23/2011  . Hiatal hernia 04/23/2011  . Asthma 04/23/2011  . Obesity, Class II, BMI 35-39.9, with comorbidity ([place]) 04/23/2011    Past Surgical History:  Procedure Laterality Date  . CARDIAC CATHETERIZATION     2010  . CARDIAC CATHETERIZATION N/A 10/22/2015   Procedure: Left Heart Cath and Coronary Angiography;  Surgeon: Nelva Bush, MD;  Location: [place] [facility];  Service: Cardiovascular;  Laterality: N/A;  . FRACTURE SURGERY     L wrist, hardware- 1982  . JOINT REPLACEMENT     L knee, 2009  . LEFT HEART CATHETERIZATION WITH CORONARY ANGIOGRAM N/A 09/08/2011   Procedure: LEFT HEART CATHETERIZATION WITH CORONARY ANGIOGRAM;  Surgeon: Sinclair Grooms, MD;  Location: [facility] CATH LAB;  Service: Cardiovascular;  Laterality: N/A;  . PROSTATECTOMY     for ca  . TONSILLECTOMY     as child  . TOTAL KNEE ARTHROPLASTY  04/22/2011   Procedure: TOTAL KNEE ARTHROPLASTY;  Surgeon: Garald Balding, MD;  Location: [place];  Service: Orthopedics;  Laterality: Right;       Home Medications    Prior  to Admission medications   Medication Sig Start Date End Date Taking? Authorizing Provider  benzonatate (TESSALON) 200 MG capsule Take 1 capsule (200 mg total) by mouth 3 (three) times daily as needed for up to 7 days for cough. 08/05/20 08/12/20 Yes Shaquina Gillham C, PA-C  doxycycline (VIBRAMYCIN) 100 MG capsule Take 1 capsule (100 mg total) by mouth 2 (two) times daily for 7 days. 08/05/20 08/12/20 Yes Marcine Gadway C, PA-C  guaiFENesin-codeine 100-10 MG/5ML syrup Take 5-10 mLs by mouth at bedtime as needed for cough. 08/05/20  Yes Hibo Blasdell C, PA-C  predniSONE (DELTASONE) 20 MG tablet Take 2 tablets (40 mg total) by mouth daily with breakfast for 5 days. 08/05/20 08/10/20 Yes Parker Wherley C, PA-C  albuterol (VENTOLIN HFA) 108 (90 Base) MCG/ACT inhaler Inhale 1-2 puffs into the lungs every 6 (six) hours as needed for wheezing or shortness of breath. 02/19/20   Shakisha Abend C, PA-C  budesonide-formoterol (SYMBICORT) 160-4.5 MCG/ACT inhaler Inhale 2 puffs into the lungs 2 (two) times daily. 05/22/17   Juanito Doom, MD  Calcium Carbonate-Vitamin D (CALCIUM + D) 600-200 MG-UNIT TABS Take 1 tablet by mouth 2 (two) times daily.    [provider]  dextromethorphan-guaiFENesin (MUCINEX DM) 30-600 MG 12hr tablet Take 1 tablet by mouth 2 (two) times daily. 02/19/20   Nyzir Dubois C, PA-C  diltiazem (TIAZAC) 120 MG 24 hr capsule daily.    [provider]  ELIQUIS 5 MG TABS tablet TAKE 1 TABLET BY MOUTH TWO  TIMES DAILY 02/24/19   Belva Crome, MD  flecainide (TAMBOCOR) 50 MG tablet Take 1 tablet (50 mg total) by mouth 2 (two) times daily. 06/14/20   Belva Crome, MD  furosemide (LASIX) 20 MG tablet Take 20 mg by mouth daily. 06/02/18   [provider]  montelukast (SINGULAIR) 10 MG tablet Take 10 mg by mouth daily before breakfast.     [provider]  Multiple Vitamins tablet Take 1 tablet by mouth daily.    [provider]  nitroGLYCERIN  (NITROSTAT) 0.4 MG SL tablet Place 1 tablet (0.4 mg total) under the tongue every 5 (five) minutes x 3 doses as needed for chest pain. 03/18/15   Erlene Quan, PA-C  Omega-3 Fatty Acids (FISH OIL) 1000 MG CAPS Take 1,000 mg by mouth 2 (two) times daily.    [provider]  omeprazole (PRILOSEC) 40 MG capsule Take 40 mg by mouth at bedtime.     [provider]  telmisartan (MICARDIS) 80 MG tablet TAKE 1 TABLET BY MOUTH  DAILY 10/01/18   Belva Crome, MD  vitamin E 1000 UNIT capsule Take 1,000 Units by mouth daily.    [provider]    Family History Family History  Problem Relation Age  of Onset  . Crohn's disease Brother   . Breast cancer Mother   . Alzheimer's disease Father   . Heart disease Brother   . Hypertension Brother   . Anesthesia problems Neg Hx   . Hypotension Neg Hx   . Malignant hyperthermia Neg Hx   . Pseudochol deficiency Neg Hx     Social History Social History   Tobacco Use  . Smoking status: Former Smoker    Packs/day: 1.50    Years: 30.00    Pack years: 45.00    Types: Cigarettes    Quit date: 04/11/1983    Years since quitting: 37.3  . Smokeless tobacco: Never Used  Vaping Use  . Vaping Use: Never used  Substance Use Topics  . Alcohol use: No    Alcohol/week: 0.0 standard drinks  . Drug use: No     Allergies   Aminophylline, Carvedilol, Lisinopril, Tenormin [atenolol], and Zolpidem tartrate   Review of Systems Review of Systems  Constitutional: Negative for activity change, appetite change, chills, fatigue and fever.  HENT: Positive for congestion. Negative for ear pain, rhinorrhea, sinus pressure, sore throat and trouble swallowing.   Eyes: Negative for discharge and redness.  Respiratory: Positive for cough, shortness of breath and wheezing. Negative for chest tightness.   Cardiovascular: Negative for chest pain.  Gastrointestinal: Negative for abdominal pain, diarrhea, nausea and vomiting.  Musculoskeletal:  Negative for myalgias.  Skin: Negative for rash.  Neurological: Negative for dizziness, light-headedness and headaches.     Physical Exam Triage Vital Signs ED Triage Vitals [08/05/20 1031]  Enc Vitals Group     BP      Pulse      Resp      Temp      Temp src      SpO2      Weight      Height      Head Circumference      Peak Flow      Pain Score 0     Pain Loc      Pain Edu?      Excl. in Icard?    No data found.  Updated Vital Signs There were no vitals taken for this visit.  Visual Acuity Right Eye Distance:   Left Eye Distance:   Bilateral Distance:    Right Eye Near:   Left Eye Near:    Bilateral Near:     Physical Exam Vitals and nursing note reviewed.  Constitutional:      Appearance: He is well-developed.     Comments: No acute distress  HENT:     Head: Normocephalic and atraumatic.     Ears:     Comments: Bilateral ears without tenderness to palpation of external auricle, tragus and mastoid, EAC's without erythema or swelling, TM's with good bony landmarks and cone of light. Non erythematous.     Nose: Nose normal.     Mouth/Throat:     Comments: Oral mucosa pink and moist, no tonsillar enlargement or exudate. Posterior pharynx patent and nonerythematous, no uvula deviation or swelling. Normal phonation. Eyes:     Conjunctiva/sclera: Conjunctivae normal.  Cardiovascular:     Rate and Rhythm: Normal rate.  Pulmonary:     Effort: Pulmonary effort is normal. No respiratory distress.     Comments: Inspiratory expiratory wheezing and rhonchi throughout bilateral lung fields Abdominal:     General: There is no distension.  Musculoskeletal:        General: Normal range of  motion.     Cervical back: Neck supple.  Skin:    General: Skin is warm and dry.  Neurological:     Mental Status: He is alert and oriented to person, place, and time.      UC Treatments / Results  Labs (all labs ordered are listed, but only abnormal results are  displayed) Labs Reviewed  NOVEL CORONAVIRUS, NAA    EKG   Radiology DG Chest 2 View  Result Date: 08/05/2020 CLINICAL DATA:  Cough, shortness of breath, wheezing EXAM: CHEST - 2 VIEW COMPARISON:  05/28/2020 FINDINGS: The heart size and mediastinal contours are within normal limits. Both lungs are clear. Disc degenerative disease of the thoracic spine. IMPRESSION: No acute abnormality of the lungs. Electronically Signed   By: Eddie Candle M.D.   On: 08/05/2020 10:57    Procedures Procedures (including critical care time)  Medications Ordered in UC Medications  methylPREDNISolone sodium succinate (SOLU-MEDROL) 125 mg/2 mL injection 80 mg (80 mg Intramuscular Given 08/05/20 1144)    Initial Impression / Assessment and Plan / UC Course  I have reviewed the triage vital signs and the nursing notes.  Pertinent labs & imaging results that were available during my care of the patient were reviewed by me and considered in my medical decision making (see chart for details).     X-ray negative for pneumonia, treating for bronchitis, providing Solu-Medrol prior to discharge and continuing on prednisone x5 days, given history opting to go out and cover for doxycycline, questionable underlying COPD.  Tessalon for daytime cough, Robitussin with codeine for bedtime.  Discussed strict return precautions. Patient verbalized understanding and is agreeable with plan.  Final Clinical Impressions(s) / UC Diagnoses   Final diagnoses:  Acute bronchitis, unspecified organism     Discharge Instructions     Begin doxycycline twice daily for 1 week We gave you a shot of Solu-Medrol, continue with prednisone daily for the next 5 days Continue albuterol inhaler as needed Tessalon every 8 hours for cough during the day Robitussin with codeine at bedtime Rest fluids Follow-up if not improving or worsening    ED Prescriptions    Medication Sig Dispense Auth. Provider   doxycycline (VIBRAMYCIN)  100 MG capsule Take 1 capsule (100 mg total) by mouth 2 (two) times daily for 7 days. 14 capsule Ruchama Kubicek C, PA-C   predniSONE (DELTASONE) 20 MG tablet Take 2 tablets (40 mg total) by mouth daily with breakfast for 5 days. 10 tablet Mackenzee Becvar C, PA-C   benzonatate (TESSALON) 200 MG capsule Take 1 capsule (200 mg total) by mouth 3 (three) times daily as needed for up to 7 days for cough. 28 capsule Rosey Eide C, PA-C   guaiFENesin-codeine 100-10 MG/5ML syrup Take 5-10 mLs by mouth at bedtime as needed for cough. 120 mL Marvell Stavola, [employer] C, PA-C     PDMP not reviewed this encounter.   Janith Lima, [place] 08/05/20 1151

## 2020-08-05 NOTE — Discharge Instructions (Addendum)
Begin doxycycline twice daily for 1 week We gave you a shot of Solu-Medrol, continue with prednisone daily for the next 5 days Continue albuterol inhaler as needed Tessalon every 8 hours for cough during the day Robitussin with codeine at bedtime Rest fluids Follow-up if not improving or worsening

## 2020-08-05 NOTE — ED Triage Notes (Signed)
Pt here for cough and congestion with SOB x 3 days; denies fever; pt with hx of PNA per wife

## 2020-08-06 LAB — SARS-COV-2, NAA 2 DAY TAT

## 2020-08-06 LAB — NOVEL CORONAVIRUS, NAA: SARS-CoV-2, NAA: NOT DETECTED

## 2020-08-08 ENCOUNTER — Other Ambulatory Visit: Payer: Self-pay

## 2020-08-08 MED ORDER — ELIQUIS 5 MG PO TABS: 1.0000 | ORAL_TABLET | Freq: Two times a day (BID) | ORAL | 1 refills | Status: DC

## 2020-08-08 NOTE — Telephone Encounter (Signed)
Pt last saw Dr Tamala Julian 11/23/19, last labs 03/21/20 Creat 1.17, age 80, weight 115.3kg, based on specified criteria pt is on appropriate dosage of Eliquis 5mg  BID for afib.  Will refill rx.

## 2020-08-10 ENCOUNTER — Telehealth (HOSPITAL_COMMUNITY): Payer: Self-pay | Admitting: Emergency Medicine

## 2020-08-10 NOTE — Telephone Encounter (Signed)
Called patient after he left a message seeking COVID results.  After reviewing with him, this RN asked how he was doing and he states "terrible".  Reviewed with Madelynn Done, APP who encouraged re-evaluation.  Patient verbalized understanding, states he will come back in the morning first thing.

## 2020-08-11 ENCOUNTER — Inpatient Hospital Stay (HOSPITAL_COMMUNITY)
Admission: EM | Admit: 2020-08-11 | Discharge: 2020-08-17 | DRG: 190 | Disposition: A | Discharge status: Home / Self Care | Payer: Medicare Other | Attending: Internal Medicine | Admitting: Internal Medicine

## 2020-08-11 ENCOUNTER — Ambulatory Visit
Admission: EM | Admit: 2020-08-11 | Discharge: 2020-08-11 | Disposition: A | Discharge status: Home / Self Care | Payer: Medicare Other

## 2020-08-11 ENCOUNTER — Encounter: Payer: Self-pay | Admitting: *Deleted

## 2020-08-11 ENCOUNTER — Other Ambulatory Visit: Payer: Self-pay

## 2020-08-11 ENCOUNTER — Emergency Department (HOSPITAL_COMMUNITY): Payer: Medicare Other

## 2020-08-11 DIAGNOSIS — Z8249 Family history of ischemic heart disease and other diseases of the circulatory system: Secondary | ICD-10-CM | POA: Diagnosis not present

## 2020-08-11 DIAGNOSIS — Z820 Family history of epilepsy and other diseases of the nervous system: Secondary | ICD-10-CM

## 2020-08-11 DIAGNOSIS — Z85118 Personal history of other malignant neoplasm of bronchus and lung: Secondary | ICD-10-CM

## 2020-08-11 DIAGNOSIS — I10 Essential (primary) hypertension: Secondary | ICD-10-CM | POA: Diagnosis not present

## 2020-08-11 DIAGNOSIS — R0600 Dyspnea, unspecified: Secondary | ICD-10-CM

## 2020-08-11 DIAGNOSIS — Z923 Personal history of irradiation: Secondary | ICD-10-CM

## 2020-08-11 DIAGNOSIS — N189 Chronic kidney disease, unspecified: Secondary | ICD-10-CM | POA: Diagnosis not present

## 2020-08-11 DIAGNOSIS — Z9119 Patient's noncompliance with other medical treatment and regimen: Secondary | ICD-10-CM

## 2020-08-11 DIAGNOSIS — Z87891 Personal history of nicotine dependence: Secondary | ICD-10-CM | POA: Diagnosis not present

## 2020-08-11 DIAGNOSIS — Z888 Allergy status to other drugs, medicaments and biological substances status: Secondary | ICD-10-CM | POA: Diagnosis not present

## 2020-08-11 DIAGNOSIS — Z20822 Contact with and (suspected) exposure to covid-19: Secondary | ICD-10-CM | POA: Diagnosis not present

## 2020-08-11 DIAGNOSIS — Z79899 Other long term (current) drug therapy: Secondary | ICD-10-CM | POA: Diagnosis not present

## 2020-08-11 DIAGNOSIS — J439 Emphysema, unspecified: Secondary | ICD-10-CM | POA: Diagnosis not present

## 2020-08-11 DIAGNOSIS — Z85820 Personal history of malignant melanoma of skin: Secondary | ICD-10-CM | POA: Diagnosis not present

## 2020-08-11 DIAGNOSIS — I129 Hypertensive chronic kidney disease with stage 1 through stage 4 chronic kidney disease, or unspecified chronic kidney disease: Secondary | ICD-10-CM | POA: Diagnosis not present

## 2020-08-11 DIAGNOSIS — C3411 Malignant neoplasm of upper lobe, right bronchus or lung: Secondary | ICD-10-CM | POA: Diagnosis not present

## 2020-08-11 DIAGNOSIS — R0989 Other specified symptoms and signs involving the circulatory and respiratory systems: Secondary | ICD-10-CM | POA: Diagnosis not present

## 2020-08-11 DIAGNOSIS — R531 Weakness: Secondary | ICD-10-CM | POA: Diagnosis present

## 2020-08-11 DIAGNOSIS — R062 Wheezing: Secondary | ICD-10-CM

## 2020-08-11 DIAGNOSIS — G4733 Obstructive sleep apnea (adult) (pediatric): Secondary | ICD-10-CM | POA: Diagnosis not present

## 2020-08-11 DIAGNOSIS — Z803 Family history of malignant neoplasm of breast: Secondary | ICD-10-CM | POA: Diagnosis not present

## 2020-08-11 DIAGNOSIS — R0602 Shortness of breath: Secondary | ICD-10-CM | POA: Diagnosis not present

## 2020-08-11 DIAGNOSIS — Z96651 Presence of right artificial knee joint: Secondary | ICD-10-CM | POA: Diagnosis not present

## 2020-08-11 DIAGNOSIS — J449 Chronic obstructive pulmonary disease, unspecified: Secondary | ICD-10-CM | POA: Diagnosis not present

## 2020-08-11 DIAGNOSIS — R9431 Abnormal electrocardiogram [ECG] [EKG]: Secondary | ICD-10-CM

## 2020-08-11 DIAGNOSIS — Z7901 Long term (current) use of anticoagulants: Secondary | ICD-10-CM

## 2020-08-11 DIAGNOSIS — Z8546 Personal history of malignant neoplasm of prostate: Secondary | ICD-10-CM

## 2020-08-11 DIAGNOSIS — J9601 Acute respiratory failure with hypoxia: Secondary | ICD-10-CM | POA: Diagnosis present

## 2020-08-11 DIAGNOSIS — K219 Gastro-esophageal reflux disease without esophagitis: Secondary | ICD-10-CM | POA: Diagnosis not present

## 2020-08-11 DIAGNOSIS — R5381 Other malaise: Secondary | ICD-10-CM | POA: Diagnosis present

## 2020-08-11 DIAGNOSIS — Z6839 Body mass index (BMI) 39.0-39.9, adult: Secondary | ICD-10-CM | POA: Diagnosis not present

## 2020-08-11 DIAGNOSIS — T380X5A Adverse effect of glucocorticoids and synthetic analogues, initial encounter: Secondary | ICD-10-CM | POA: Diagnosis not present

## 2020-08-11 DIAGNOSIS — R059 Cough, unspecified: Secondary | ICD-10-CM | POA: Diagnosis not present

## 2020-08-11 DIAGNOSIS — J441 Chronic obstructive pulmonary disease with (acute) exacerbation: Secondary | ICD-10-CM | POA: Diagnosis not present

## 2020-08-11 DIAGNOSIS — I480 Paroxysmal atrial fibrillation: Secondary | ICD-10-CM | POA: Diagnosis present

## 2020-08-11 DIAGNOSIS — J9690 Respiratory failure, unspecified, unspecified whether with hypoxia or hypercapnia: Secondary | ICD-10-CM | POA: Diagnosis not present

## 2020-08-11 DIAGNOSIS — Z8616 Personal history of COVID-19: Secondary | ICD-10-CM | POA: Diagnosis not present

## 2020-08-11 DIAGNOSIS — Z7951 Long term (current) use of inhaled steroids: Secondary | ICD-10-CM

## 2020-08-11 DIAGNOSIS — Z8719 Personal history of other diseases of the digestive system: Secondary | ICD-10-CM

## 2020-08-11 DIAGNOSIS — R000 Tachycardia, unspecified: Secondary | ICD-10-CM | POA: Diagnosis not present

## 2020-08-11 DIAGNOSIS — Z8709 Personal history of other diseases of the respiratory system: Secondary | ICD-10-CM

## 2020-08-11 LAB — BASIC METABOLIC PANEL
Anion gap: 8 (ref 5–15)
BUN: 20 mg/dL (ref 8–23)
CO2: 29 mmol/L (ref 22–32)
Calcium: 9 mg/dL (ref 8.9–10.3)
Chloride: 104 mmol/L (ref 98–111)
Creatinine, Ser: 1.22 mg/dL (ref 0.61–1.24)
GFR, Estimated: 59 mL/min — ABNORMAL LOW (ref >60)
Glucose, Bld: 118 mg/dL — ABNORMAL HIGH (ref 70–99)
Potassium: 3.5 mmol/L (ref 3.5–5.1)
Sodium: 141 mmol/L (ref 135–145)

## 2020-08-11 LAB — CBC WITH DIFFERENTIAL/PLATELET
Abs Immature Granulocytes: 0.06 K/uL (ref 0.00–0.07)
Basophils Absolute: 0 K/uL (ref 0.0–0.1)
Basophils Relative: 0 %
Eosinophils Absolute: 0.1 K/uL (ref 0.0–0.5)
Eosinophils Relative: 1 %
HCT: 41.8 % (ref 39.0–52.0)
Hemoglobin: 14.4 g/dL (ref 13.0–17.0)
Immature Granulocytes: 1 %
Lymphocytes Relative: 26 %
Lymphs Abs: 2.4 K/uL (ref 0.7–4.0)
MCH: 30.7 pg (ref 26.0–34.0)
MCHC: 34.4 g/dL (ref 30.0–36.0)
MCV: 89.1 fL (ref 80.0–100.0)
Monocytes Absolute: 0.5 K/uL (ref 0.1–1.0)
Monocytes Relative: 6 %
Neutro Abs: 6.2 K/uL (ref 1.7–7.7)
Neutrophils Relative %: 66 %
Platelets: 186 K/uL (ref 150–400)
RBC: 4.69 MIL/uL (ref 4.22–5.81)
RDW: 13.3 % (ref 11.5–15.5)
WBC: 9.3 K/uL (ref 4.0–10.5)
nRBC: 0 % (ref 0.0–0.2)

## 2020-08-11 LAB — RESP PANEL BY RT-PCR (FLU A&B, COVID) ARPGX2
Influenza A by PCR: NEGATIVE
Influenza B by PCR: NEGATIVE
SARS Coronavirus 2 by RT PCR: NEGATIVE

## 2020-08-11 MED ORDER — METHYLPREDNISOLONE SODIUM SUCC 125 MG IJ SOLR
125.0000 mg | Freq: Once | INTRAMUSCULAR | Status: AC
  Administered 2020-08-11: 125 mg via INTRAVENOUS
  Filled 2020-08-11: qty 2

## 2020-08-11 MED ORDER — ALBUTEROL SULFATE HFA 108 (90 BASE) MCG/ACT IN AERS
4.0000 | INHALATION_SPRAY | Freq: Once | RESPIRATORY_TRACT | Status: AC
  Administered 2020-08-11: 4 via RESPIRATORY_TRACT
  Filled 2020-08-11: qty 6.7

## 2020-08-11 MED ORDER — ALBUTEROL (5 MG/ML) CONTINUOUS INHALATION SOLN
10.0000 mg/h | INHALATION_SOLUTION | Freq: Once | RESPIRATORY_TRACT | Status: AC
  Administered 2020-08-12: 10 mg/h via RESPIRATORY_TRACT
  Filled 2020-08-11: qty 20

## 2020-08-11 MED ORDER — IPRATROPIUM BROMIDE HFA 17 MCG/ACT IN AERS
2.0000 | INHALATION_SPRAY | Freq: Once | RESPIRATORY_TRACT | Status: AC
  Administered 2020-08-11: 2 via RESPIRATORY_TRACT
  Filled 2020-08-11: qty 12.9

## 2020-08-11 MED ORDER — LACTATED RINGERS IV SOLN: INTRAVENOUS | Status: DC

## 2020-08-11 NOTE — ED Provider Notes (Signed)
[facility]   MRN: [id number] DOB: Jul 13, 1940  Subjective:   [name] [name] is a 80 y.o. male presenting for recheck.  Patient was last seen here on 08/05/2020 for cough, chest congestion, shortness of breath, wheezing.  Patient had a negative chest x-ray at that time.  He was given a steroid injection, oral steroid course, doxycycline.  Reports that following his treatment he had no improvement and feels worse.  He did have a COVID-19 test that was negative.  He has been using his inhalers at home.  Has a history of asthma and COPD, history of acute on chronic respiratory failure with hypoxia.   No current facility-administered medications for this encounter.  Current Outpatient Medications:  .  albuterol (VENTOLIN HFA) 108 (90 Base) MCG/ACT inhaler, Inhale 1-2 puffs into the lungs every 6 (six) hours as needed for wheezing or shortness of breath., Disp: 18 g, Rfl: 0 .  apixaban (ELIQUIS) 5 MG TABS tablet, Take 1 tablet (5 mg total) by mouth 2 (two) times daily., Disp: 180 tablet, Rfl: 1 .  benzonatate (TESSALON) 200 MG capsule, Take 1 capsule (200 mg total) by mouth 3 (three) times daily as needed for up to 7 days for cough., Disp: 28 capsule, Rfl: 0 .  budesonide-formoterol (SYMBICORT) 160-4.5 MCG/ACT inhaler, Inhale 2 puffs into the lungs 2 (two) times daily., Disp: 2 Inhaler, Rfl: 0 .  Calcium Carbonate-Vitamin D 600-200 MG-UNIT TABS, Take 1 tablet by mouth 2 (two) times daily., Disp: , Rfl:  .  dextromethorphan-guaiFENesin (MUCINEX DM) 30-600 MG 12hr tablet, Take 1 tablet by mouth 2 (two) times daily., Disp: 20 tablet, Rfl: 0 .  diltiazem (TIAZAC) 120 MG 24 hr capsule, daily., Disp: , Rfl:  .  doxycycline (VIBRAMYCIN) 100 MG capsule, Take 1 capsule (100 mg total) by mouth 2 (two) times daily for 7 days., Disp: 14 capsule, Rfl: 0 .  flecainide (TAMBOCOR) 50 MG tablet, Take 1 tablet (50 mg total) by mouth 2 (two) times daily., Disp: 180 tablet, Rfl: 1 .  furosemide (LASIX)  20 MG tablet, Take 20 mg by mouth daily., Disp: , Rfl:  .  guaiFENesin-codeine 100-10 MG/5ML syrup, Take 5-10 mLs by mouth at bedtime as needed for cough., Disp: 120 mL, Rfl: 0 .  montelukast (SINGULAIR) 10 MG tablet, Take 10 mg by mouth daily before breakfast. , Disp: , Rfl:  .  Multiple Vitamins tablet, Take 1 tablet by mouth daily., Disp: , Rfl:  .  Omega-3 Fatty Acids (FISH OIL) 1000 MG CAPS, Take 1,000 mg by mouth 2 (two) times daily., Disp: , Rfl:  .  omeprazole (PRILOSEC) 40 MG capsule, Take 40 mg by mouth at bedtime. , Disp: , Rfl:  .  telmisartan (MICARDIS) 80 MG tablet, TAKE 1 TABLET BY MOUTH  DAILY, Disp: 90 tablet, Rfl: 3 .  vitamin E 1000 UNIT capsule, Take 1,000 Units by mouth daily., Disp: , Rfl:  .  nitroGLYCERIN (NITROSTAT) 0.4 MG SL tablet, Place 1 tablet (0.4 mg total) under the tongue every 5 (five) minutes x 3 doses as needed for chest pain., Disp: 25 tablet, Rfl: 3   Allergies  Allergen Reactions  . Aminophylline   . Carvedilol     SOB/Fatigue  . Lisinopril Cough  . Tenormin [Atenolol] Other (See Comments)    Fatigue, low HR  . Zolpidem Tartrate Other (See Comments)    hallucinations    Past Medical History:  Diagnosis Date  . Angina    chest pain- cardiac cath. followed in  2010, record available ,  told then that he should f/u /w Dr. Marisue Humble    . Arthritis    R knee, back    . Asthma    uses  singulair  . Cancer [facility])    prostate, melanoma- 2010, excision    . Chronic kidney disease    prostate cancer - surg. removal- 1996  . Colon polyps   . Diverticulitis   . Dysrhythmia    palpitations, followed by Dr.Ehinger, seen in prep for surgery on 03/27/2011   . GERD (gastroesophageal reflux disease)   . Hepatitis    jaundice- many yrs. ago  . Hiatal hernia   . Hypertension   . Melanoma ([place])    Face  . OSA (obstructive sleep apnea) 04/11/2015   Mild to moderate OSA with AHI 13.3/hr overall and AHI 36.8/hr during REM sleep.  Oxygen saturations were as low as  86% during respiratory events.  . Pneumonia    hosp. 20 yrs. ago  . Prostate cancer ([place]) 1995  . Sleep apnea    study done, 10 yrs. ago, told that he needed  CPAP but  never used      Past Surgical History:  Procedure Laterality Date  . CARDIAC CATHETERIZATION     2010  . CARDIAC CATHETERIZATION N/A 10/22/2015   Procedure: Left Heart Cath and Coronary Angiography;  Surgeon: Nelva Bush, MD;  Location: [place] [facility];  Service: Cardiovascular;  Laterality: N/A;  . FRACTURE SURGERY     L wrist, hardware- 1982  . JOINT REPLACEMENT     L knee, 2009  . LEFT HEART CATHETERIZATION WITH CORONARY ANGIOGRAM N/A 09/08/2011   Procedure: LEFT HEART CATHETERIZATION WITH CORONARY ANGIOGRAM;  Surgeon: Sinclair Grooms, MD;  Location: [facility] CATH LAB;  Service: Cardiovascular;  Laterality: N/A;  . PROSTATECTOMY     for ca  . TONSILLECTOMY     as child  . TOTAL KNEE ARTHROPLASTY  04/22/2011   Procedure: TOTAL KNEE ARTHROPLASTY;  Surgeon: Garald Balding, MD;  Location: [place];  Service: Orthopedics;  Laterality: Right;    Family History  Problem Relation Age of Onset  . Crohn's disease Brother   . Breast cancer Mother   . Alzheimer's disease Father   . Heart disease Brother   . Hypertension Brother   . Anesthesia problems Neg Hx   . Hypotension Neg Hx   . Malignant hyperthermia Neg Hx   . Pseudochol deficiency Neg Hx     Social History   Tobacco Use  . Smoking status: Former Smoker    Packs/day: 1.50    Years: 30.00    Pack years: 45.00    Types: Cigarettes    Quit date: 04/11/1983    Years since quitting: 37.3  . Smokeless tobacco: Never Used  Vaping Use  . Vaping Use: Never used  Substance Use Topics  . Alcohol use: No  . Drug use: No    ROS   Objective:   Vitals: BP (!) 149/79   Pulse 62   Temp 98.4 F (36.9 C) (Temporal)   Resp 12   SpO2 91%   Physical Exam Constitutional:      General: He is not in acute distress.    Appearance: Normal appearance. He is  well-developed. He is ill-appearing. He is not toxic-appearing or diaphoretic.  HENT:     Head: Normocephalic and atraumatic.     Right Ear: External ear normal.     Left Ear: External ear normal.  Nose: Nose normal.     Mouth/Throat:     Mouth: Mucous membranes are moist.     Pharynx: Oropharynx is clear.     Comments: No pursed or cyanotic lips. Eyes:     General: No scleral icterus.    Extraocular Movements: Extraocular movements intact.     Pupils: Pupils are equal, round, and reactive to light.  Cardiovascular:     Rate and Rhythm: Normal rate and regular rhythm.     Heart sounds: Normal heart sounds. No murmur heard. No friction rub. No gallop.   Pulmonary:     Effort: No respiratory distress.     Breath sounds: No stridor. Wheezing and rhonchi present. No rales.     Comments: Using accessory muscles to help him with his breathing intermittently. Skin:    General: Skin is warm and dry.  Neurological:     Mental Status: He is alert and oriented to person, place, and time.  Psychiatric:        Mood and Affect: Mood normal.        Behavior: Behavior normal.        Thought Content: Thought content normal.     DG Chest 2 View  Result Date: 08/05/2020 CLINICAL DATA:  Cough, shortness of breath, wheezing EXAM: CHEST - 2 VIEW COMPARISON:  05/28/2020 FINDINGS: The heart size and mediastinal contours are within normal limits. Both lungs are clear. Disc degenerative disease of the thoracic spine. IMPRESSION: No acute abnormality of the lungs. Electronically Signed   By: Eddie Candle M.D.   On: 08/05/2020 10:57   Assessment and Plan :   PDMP not reviewed this encounter.  1. Wheezing   2. Rhonchi   3. History of respiratory failure     Patient has failed outpatient treatment and is feeling worse, is hypoxic.  Recommended further evaluation and intervention in the emergency room.  Contracts for safety and will have his wife drive him there now.   Jaynee Eagles, PA-C 08/11/20  1416

## 2020-08-11 NOTE — ED Provider Notes (Signed)
[facility] DEPT Provider Note   CSN: [id number] Arrival date & time: 08/11/20  1436     History Chief Complaint  Patient presents with  . Cough    [name] [name] is a 80 y.o. male.  80 year old male with history of COPD presents with increased shortness of breath times several days.  Patient went to urgent care where he was found to have lots of wheezing.  Was sent in for further management.  He denies any chest pain.  No fever or chills.  States that he had COVID last year and has had the vaccine.  No vomiting or diarrhea.  Symptoms worse with exertion and unrelieved with his inhalers        Past Medical History:  Diagnosis Date  . Angina    chest pain- cardiac cath. followed in 2010, record available ,  told then that he should f/u /w Dr. Marisue Humble    . Arthritis    R knee, back    . Asthma    uses  singulair  . Cancer [facility])    prostate, melanoma- 2010, excision    . Chronic kidney disease    prostate cancer - surg. removal- 1996  . Colon polyps   . Diverticulitis   . Dysrhythmia    palpitations, followed by Dr.Ehinger, seen in prep for surgery on 03/27/2011   . GERD (gastroesophageal reflux disease)   . Hepatitis    jaundice- many yrs. ago  . Hiatal hernia   . Hypertension   . Melanoma ([place])    Face  . OSA (obstructive sleep apnea) 04/11/2015   Mild to moderate OSA with AHI 13.3/hr overall and AHI 36.8/hr during REM sleep.  Oxygen saturations were as low as 86% during respiratory events.  . Pneumonia    hosp. 20 yrs. ago  . Prostate cancer ([place]) 1995  . Sleep apnea    study done, 10 yrs. ago, told that he needed  CPAP but  never used     Patient Active Problem List   Diagnosis Date Noted  . Allergic rhinitis 06/14/2019  . Beat, premature ventricular 06/14/2019  . H/O adenomatous polyp of colon 06/14/2019  . Malignant neoplasm of upper lobe of right lung ([place]) 01/18/2019  . Low back pain 10/05/2018  . Left foot pain  08/12/2018  . Constipation   . Lobar pneumonia ([place]) 02/27/2017  . COPD with acute exacerbation ([place]) 02/27/2017  . GERD (gastroesophageal reflux disease) 02/27/2017  . CKD (chronic kidney disease), stage III ([place]) 02/27/2017  . Acute and chronic respiratory failure with hypoxia ([place]) 02/27/2017  . Anticoagulated 03/19/2016  . Otorrhagia of left ear 03/19/2016  . Anxiety 10/30/2015  . Dyspnea 08/20/2015  . Solitary pulmonary nodule 05/18/2015  . OSA (obstructive sleep apnea) 04/11/2015  . Chronic anticoagulation 03/17/2015  . Asthma, chronic obstructive, with acute exacerbation ([place]) 02/26/2015  . PAF (paroxysmal atrial fibrillation) ([place]) 01/26/2015  . Actinic keratoses 11/23/2014  . Awareness of heartbeats 11/23/2014  . Suspected congestive heart failure 05/05/2014  . Suspected sleep apnea 05/05/2014  . 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 Postoperative anemia due to acute blood loss 04/24/2011  . Osteoarthritis of knee 04/23/2011  . Hypertension 04/23/2011  . History of prostate cancer 04/23/2011  . Melanoma ([place]) 04/23/2011  . Hiatal hernia 04/23/2011  . Asthma 04/23/2011  . Obesity, Class II, BMI 35-39.9, with comorbidity ([place]) 04/23/2011    Past Surgical History:  Procedure Laterality Date  . CARDIAC CATHETERIZATION  2010  . CARDIAC CATHETERIZATION N/A 10/22/2015   Procedure: Left Heart Cath and Coronary Angiography;  Surgeon: Nelva Bush, MD;  Location: [facility] CV LAB;  Service: Cardiovascular;  Laterality: N/A;  . FRACTURE SURGERY     L wrist, hardware- 1982  . JOINT REPLACEMENT     L knee, 2009  . LEFT HEART CATHETERIZATION WITH CORONARY ANGIOGRAM N/A 09/08/2011   Procedure: LEFT HEART CATHETERIZATION WITH CORONARY ANGIOGRAM;  Surgeon: Sinclair Grooms, MD;  Location: [facility] CATH LAB;  Service: Cardiovascular;  Laterality: N/A;  . PROSTATECTOMY     for ca  . TONSILLECTOMY     as child  . TOTAL KNEE ARTHROPLASTY  04/22/2011   Procedure: TOTAL KNEE  ARTHROPLASTY;  Surgeon: Garald Balding, MD;  Location: [place] [facility] [place];  Service: Orthopedics;  Laterality: Right;       Family History  Problem Relation Age of Onset  . Crohn's disease Brother   . Breast cancer Mother   . Alzheimer's disease Father   . Heart disease Brother   . Hypertension Brother   . Anesthesia problems Neg Hx   . Hypotension Neg Hx   . Malignant hyperthermia Neg Hx   . Pseudochol deficiency Neg Hx     Social History   Tobacco Use  . Smoking status: Former Smoker    Packs/day: 1.50    Years: 30.00    Pack years: 45.00    Types: Cigarettes    Quit date: 04/11/1983    Years since quitting: 37.3  . Smokeless tobacco: Never Used  Vaping Use  . Vaping Use: Never used  Substance Use Topics  . Alcohol use: No  . Drug use: No    Home Medications Prior to Admission medications   Medication Sig Start Date End Date Taking? Authorizing Provider  albuterol (VENTOLIN HFA) 108 (90 Base) MCG/ACT inhaler Inhale 1-2 puffs into the lungs every 6 (six) hours as needed for wheezing or shortness of breath. 02/19/20   Wieters, Hallie C, PA-C  apixaban (ELIQUIS) 5 MG TABS tablet Take 1 tablet (5 mg total) by mouth 2 (two) times daily. 08/08/20   Belva Crome, MD  benzonatate (TESSALON) 200 MG capsule Take 1 capsule (200 mg total) by mouth 3 (three) times daily as needed for up to 7 days for cough. 08/05/20 08/12/20  Wieters, Hallie C, PA-C  budesonide-formoterol (SYMBICORT) 160-4.5 MCG/ACT inhaler Inhale 2 puffs into the lungs 2 (two) times daily. 05/22/17   Juanito Doom, MD  Calcium Carbonate-Vitamin D 600-200 MG-UNIT TABS Take 1 tablet by mouth 2 (two) times daily.    [provider]  dextromethorphan-guaiFENesin (MUCINEX DM) 30-600 MG 12hr tablet Take 1 tablet by mouth 2 (two) times daily. 02/19/20   Wieters, Hallie C, PA-C  diltiazem (TIAZAC) 120 MG 24 hr capsule daily.    [provider]  doxycycline (VIBRAMYCIN) 100 MG capsule Take 1 capsule (100 mg total)  by mouth 2 (two) times daily for 7 days. 08/05/20 08/12/20  Wieters, Hallie C, PA-C  flecainide (TAMBOCOR) 50 MG tablet Take 1 tablet (50 mg total) by mouth 2 (two) times daily. 06/14/20   Belva Crome, MD  furosemide (LASIX) 20 MG tablet Take 20 mg by mouth daily. 06/02/18   [provider]  guaiFENesin-codeine 100-10 MG/5ML syrup Take 5-10 mLs by mouth at bedtime as needed for cough. 08/05/20   Wieters, Hallie C, PA-C  montelukast (SINGULAIR) 10 MG tablet Take 10 mg by mouth daily before breakfast.  [provider]  Multiple Vitamins tablet Take 1 tablet by mouth daily.    [provider]  nitroGLYCERIN (NITROSTAT) 0.4 MG SL tablet Place 1 tablet (0.4 mg total) under the tongue every 5 (five) minutes x 3 doses as needed for chest pain. 03/18/15   Erlene Quan, PA-C  Omega-3 Fatty Acids (FISH OIL) 1000 MG CAPS Take 1,000 mg by mouth 2 (two) times daily.    [provider]  omeprazole (PRILOSEC) 40 MG capsule Take 40 mg by mouth at bedtime.     [provider]  telmisartan (MICARDIS) 80 MG tablet TAKE 1 TABLET BY MOUTH  DAILY 10/01/18   Belva Crome, MD  vitamin E 1000 UNIT capsule Take 1,000 Units by mouth daily.    [provider]    Allergies    Aminophylline, Carvedilol, Lisinopril, Tenormin [atenolol], and Zolpidem tartrate  Review of Systems   Review of Systems  All other systems reviewed and are negative.   Physical Exam Updated Vital Signs BP (!) 188/88   Pulse (!) 45   Temp 97.7 F (36.5 C)   Resp 18   Ht 1.702 m (5\' 7" )   Wt 112.9 kg   SpO2 91%   BMI 39.00 kg/m   Physical Exam Vitals and nursing note reviewed.  Constitutional:      General: He is not in acute distress.    Appearance: Normal appearance. He is well-developed. He is not toxic-appearing.  HENT:     Head: Normocephalic and atraumatic.  Eyes:     General: Lids are normal.     Conjunctiva/sclera: Conjunctivae normal.     Pupils: Pupils are equal,  round, and reactive to light.  Neck:     Thyroid: No thyroid mass.     Trachea: No tracheal deviation.  Cardiovascular:     Rate and Rhythm: Normal rate and regular rhythm.     Heart sounds: Normal heart sounds. No murmur heard. No gallop.   Pulmonary:     Effort: Tachypnea, prolonged expiration and respiratory distress present.     Breath sounds: No stridor. Decreased breath sounds and wheezing present.  Abdominal:     General: Bowel sounds are normal. There is no distension.     Palpations: Abdomen is soft.     Tenderness: There is no abdominal tenderness. There is no rebound.  Musculoskeletal:        General: No tenderness. Normal range of motion.     Cervical back: Normal range of motion and neck supple.  Skin:    General: Skin is warm and dry.     Findings: No abrasion or rash.  Neurological:     Mental Status: He is alert and oriented to person, place, and time.     GCS: GCS eye subscore is 4. GCS verbal subscore is 5. GCS motor subscore is 6.     Cranial Nerves: No cranial nerve deficit.     Sensory: No sensory deficit.  Psychiatric:        Speech: Speech normal.        Behavior: Behavior normal.     ED Results / Procedures / Treatments   Labs (all labs ordered are listed, but only abnormal results are displayed) Labs Reviewed  BASIC METABOLIC PANEL - Abnormal; Notable for the following components:      Result Value   Glucose, Bld 118 (*)    GFR, Estimated 59 (*)    All other components within normal limits  RESP PANEL  BY RT-PCR (FLU A&B, COVID) [id number]  CBC WITH DIFFERENTIAL/PLATELET    EKG EKG Interpretation  Date/Time:  Saturday August 11 2020 15:52:20 EDT Ventricular Rate:  63 PR Interval:  190 QRS Duration: 109 QT Interval:  469 QTC Calculation: 481 R Axis:   -21 Text Interpretation: Sinus rhythm Borderline left axis deviation Borderline T abnormalities, anterior leads Borderline prolonged QT interval 12 Lead; [place] Confirmed by Lacretia Leigh  ([id number]) on 08/11/2020 8:39:41 PM   Radiology DG Chest 2 View  Result Date: 08/11/2020 CLINICAL DATA:  Coughing and wheezing. EXAM: CHEST - 2 VIEW COMPARISON:  08/05/2020 FINDINGS: The heart size and mediastinal contours are within normal limits. Mild bibasilar scarring/atelectasis, left greater than right. There is no evidence of pulmonary edema, consolidation, pneumothorax, nodule or pleural fluid. The visualized skeletal structures are unremarkable. IMPRESSION: Bibasilar scarring/atelectasis. Electronically Signed   By: Aletta Edouard M.D.   On: 08/11/2020 15:35    Procedures Procedures   Medications Ordered in ED Medications  methylPREDNISolone sodium succinate (SOLU-MEDROL) 125 mg/2 mL injection 125 mg (has no administration in time range)  albuterol (VENTOLIN HFA) 108 (90 Base) MCG/ACT inhaler 4 puff (has no administration in time range)  ipratropium (ATROVENT HFA) inhaler 2 puff (has no administration in time range)  lactated ringers infusion (has no administration in time range)    ED Course  I have reviewed the triage vital signs and the nursing notes.  Pertinent labs & imaging results that were available during my care of the patient were reviewed by me and considered in my medical decision making (see chart for details).    MDM Rules/Calculators/A&P                         COVID test pending. Patient likely COPD exacerbation here.  Chest x-ray without acute infiltrates.  Labs reassuring. Plan will be to give patient Solu-Medrol, albuterol with Atrovent.  Will give nebulizers once COVID test is negative.  Patient may require admission.  Will sign out to next provider Final Clinical Impression(s) / ED Diagnoses Final diagnoses:  None    Rx / DC Orders ED Discharge Orders    None       Lacretia Leigh, MD 08/11/20 2229

## 2020-08-11 NOTE — ED Provider Notes (Signed)
Emergency Medicine Provider Triage Evaluation Note  [name] [name] , a 80 y.o. male  was evaluated in triage.  Pt complains of ongoing wheezing and shortness of breath for the past few days.  Denies any chest pain, cough or fevers.  Minimal improvement noted with home inhalers.  Also reports history of lung cancer.  Review of Systems  Positive: Shortness of breath, wheezing Negative: Chest pain, cough, fever  Physical Exam  BP (!) 169/80 (BP Location: Left Arm)   Pulse 63   Temp (!) 97.5 F (36.4 C) (Oral)   Resp 20   Ht 5\' 7"  (1.702 m)   Wt 112.9 kg   SpO2 93%   BMI 39.00 kg/m  Gen:   Awake, no distress   Resp:  Normal effort  MSK:   Moves extremities without difficulty  Other:  Speaking in complete sentences  Medical Decision Making  Medically screening exam initiated at 3:13 PM.  Appropriate orders placed.  DEONDRAE MCGRAIL was informed that the remainder of the evaluation will be completed by another provider, this initial triage assessment does not replace that evaluation, and the importance of remaining in the ED until their evaluation is complete.  Chest x-ray and lab work ordered   Delia Heady, Hershal Coria 08/11/20 [place]    Lacretia Leigh, MD 08/12/20 [phone]

## 2020-08-11 NOTE — ED Triage Notes (Signed)
Patient reports he is coughing and wheezing. Evaluated at urgent care last Sunday. Went back today because he isnt better at urgent care told patient to come to ED. Patient denies pain has history of respiratory failure.

## 2020-08-11 NOTE — ED Triage Notes (Signed)
Pt seen 08/05/2020 for same.  States not having any improvement.  Has been taking the Rx meds without relief -- states he ran out now.  Denies fevers.

## 2020-08-11 NOTE — Discharge Instructions (Addendum)
Please report to the hospital now as I am concerned that you have respiratory failure. You had a negative chest x-ray with [place] on 08/05/2020 and failed outpatient treatment with a steroid injection, oral steroids and an antibiotic. Your oxygen level is low and you have a history of respiratory failure which is a medical emergency. Please have your wife drive you to the hospital now.

## 2020-08-12 ENCOUNTER — Encounter (HOSPITAL_COMMUNITY): Payer: Self-pay | Admitting: Family Medicine

## 2020-08-12 DIAGNOSIS — Z8616 Personal history of COVID-19: Secondary | ICD-10-CM | POA: Diagnosis not present

## 2020-08-12 DIAGNOSIS — Z8249 Family history of ischemic heart disease and other diseases of the circulatory system: Secondary | ICD-10-CM | POA: Diagnosis not present

## 2020-08-12 DIAGNOSIS — I480 Paroxysmal atrial fibrillation: Secondary | ICD-10-CM | POA: Diagnosis not present

## 2020-08-12 DIAGNOSIS — R0602 Shortness of breath: Secondary | ICD-10-CM | POA: Diagnosis present

## 2020-08-12 DIAGNOSIS — R9431 Abnormal electrocardiogram [ECG] [EKG]: Secondary | ICD-10-CM

## 2020-08-12 DIAGNOSIS — Z888 Allergy status to other drugs, medicaments and biological substances status: Secondary | ICD-10-CM | POA: Diagnosis not present

## 2020-08-12 DIAGNOSIS — Z20822 Contact with and (suspected) exposure to covid-19: Secondary | ICD-10-CM | POA: Diagnosis present

## 2020-08-12 DIAGNOSIS — I129 Hypertensive chronic kidney disease with stage 1 through stage 4 chronic kidney disease, or unspecified chronic kidney disease: Secondary | ICD-10-CM | POA: Diagnosis present

## 2020-08-12 DIAGNOSIS — R0989 Other specified symptoms and signs involving the circulatory and respiratory systems: Secondary | ICD-10-CM | POA: Diagnosis present

## 2020-08-12 DIAGNOSIS — J9690 Respiratory failure, unspecified, unspecified whether with hypoxia or hypercapnia: Secondary | ICD-10-CM | POA: Diagnosis not present

## 2020-08-12 DIAGNOSIS — J441 Chronic obstructive pulmonary disease with (acute) exacerbation: Secondary | ICD-10-CM | POA: Diagnosis not present

## 2020-08-12 DIAGNOSIS — J9601 Acute respiratory failure with hypoxia: Secondary | ICD-10-CM

## 2020-08-12 DIAGNOSIS — Z820 Family history of epilepsy and other diseases of the nervous system: Secondary | ICD-10-CM | POA: Diagnosis not present

## 2020-08-12 DIAGNOSIS — G4733 Obstructive sleep apnea (adult) (pediatric): Secondary | ICD-10-CM | POA: Diagnosis not present

## 2020-08-12 DIAGNOSIS — Z7901 Long term (current) use of anticoagulants: Secondary | ICD-10-CM

## 2020-08-12 DIAGNOSIS — I10 Essential (primary) hypertension: Secondary | ICD-10-CM | POA: Diagnosis not present

## 2020-08-12 DIAGNOSIS — Z85820 Personal history of malignant melanoma of skin: Secondary | ICD-10-CM | POA: Diagnosis not present

## 2020-08-12 DIAGNOSIS — Z803 Family history of malignant neoplasm of breast: Secondary | ICD-10-CM | POA: Diagnosis not present

## 2020-08-12 DIAGNOSIS — Z87891 Personal history of nicotine dependence: Secondary | ICD-10-CM | POA: Diagnosis not present

## 2020-08-12 DIAGNOSIS — J439 Emphysema, unspecified: Secondary | ICD-10-CM | POA: Diagnosis not present

## 2020-08-12 DIAGNOSIS — R059 Cough, unspecified: Secondary | ICD-10-CM | POA: Diagnosis not present

## 2020-08-12 DIAGNOSIS — Z79899 Other long term (current) drug therapy: Secondary | ICD-10-CM | POA: Diagnosis not present

## 2020-08-12 DIAGNOSIS — J449 Chronic obstructive pulmonary disease, unspecified: Secondary | ICD-10-CM | POA: Diagnosis not present

## 2020-08-12 DIAGNOSIS — C3411 Malignant neoplasm of upper lobe, right bronchus or lung: Secondary | ICD-10-CM | POA: Diagnosis present

## 2020-08-12 DIAGNOSIS — Z7951 Long term (current) use of inhaled steroids: Secondary | ICD-10-CM | POA: Diagnosis not present

## 2020-08-12 DIAGNOSIS — R062 Wheezing: Secondary | ICD-10-CM | POA: Diagnosis not present

## 2020-08-12 DIAGNOSIS — Z6839 Body mass index (BMI) 39.0-39.9, adult: Secondary | ICD-10-CM | POA: Diagnosis not present

## 2020-08-12 DIAGNOSIS — N189 Chronic kidney disease, unspecified: Secondary | ICD-10-CM | POA: Diagnosis present

## 2020-08-12 DIAGNOSIS — K219 Gastro-esophageal reflux disease without esophagitis: Secondary | ICD-10-CM | POA: Diagnosis present

## 2020-08-12 DIAGNOSIS — Z8546 Personal history of malignant neoplasm of prostate: Secondary | ICD-10-CM | POA: Diagnosis not present

## 2020-08-12 DIAGNOSIS — Z96651 Presence of right artificial knee joint: Secondary | ICD-10-CM | POA: Diagnosis present

## 2020-08-12 LAB — BASIC METABOLIC PANEL
Anion gap: 9 (ref 5–15)
BUN: 17 mg/dL (ref 8–23)
CO2: 28 mmol/L (ref 22–32)
Calcium: 8.7 mg/dL — ABNORMAL LOW (ref 8.9–10.3)
Chloride: 105 mmol/L (ref 98–111)
Creatinine, Ser: 1.11 mg/dL (ref 0.61–1.24)
GFR, Estimated: >60 mL/min (ref >60)
Glucose, Bld: 183 mg/dL — ABNORMAL HIGH (ref 70–99)
Potassium: 3.5 mmol/L (ref 3.5–5.1)
Sodium: 142 mmol/L (ref 135–145)

## 2020-08-12 LAB — CBC
HCT: 39 % (ref 39.0–52.0)
Hemoglobin: 13.4 g/dL (ref 13.0–17.0)
MCH: 30.8 pg (ref 26.0–34.0)
MCHC: 34.4 g/dL (ref 30.0–36.0)
MCV: 89.7 fL (ref 80.0–100.0)
Platelets: 170 10*3/uL (ref 150–400)
RBC: 4.35 MIL/uL (ref 4.22–5.81)
RDW: 13.3 % (ref 11.5–15.5)
WBC: 7.5 10*3/uL (ref 4.0–10.5)
nRBC: 0 % (ref 0.0–0.2)

## 2020-08-12 LAB — BRAIN NATRIURETIC PEPTIDE: B Natriuretic Peptide: 62.4 pg/mL (ref 0.0–100.0)

## 2020-08-12 LAB — D-DIMER, QUANTITATIVE: D-Dimer, Quant: 0.32 ug/mL-FEU (ref 0.00–0.50)

## 2020-08-12 LAB — TROPONIN I (HIGH SENSITIVITY)
Troponin I (High Sensitivity): 7 ng/L (ref <18)
Troponin I (High Sensitivity): 10 ng/L (ref <18)

## 2020-08-12 LAB — PROCALCITONIN: Procalcitonin: <0.1 ng/mL

## 2020-08-12 MED ORDER — GUAIFENESIN ER 600 MG PO TB12
600.0000 mg | ORAL_TABLET | Freq: Two times a day (BID) | ORAL | Status: DC
  Administered 2020-08-12 – 2020-08-14 (×5): 600 mg via ORAL
  Filled 2020-08-12 (×5): qty 1

## 2020-08-12 MED ORDER — DILTIAZEM HCL ER BEADS 120 MG PO CP24
120.0000 mg | ORAL_CAPSULE | Freq: Every day | ORAL | Status: DC
  Filled 2020-08-12: qty 1

## 2020-08-12 MED ORDER — DOXYCYCLINE HYCLATE 100 MG PO TABS
100.0000 mg | ORAL_TABLET | Freq: Two times a day (BID) | ORAL | Status: DC
  Administered 2020-08-12: 100 mg via ORAL
  Filled 2020-08-12: qty 1

## 2020-08-12 MED ORDER — SODIUM CHLORIDE 0.9% FLUSH
3.0000 mL | Freq: Two times a day (BID) | INTRAVENOUS | Status: DC
  Administered 2020-08-12 – 2020-08-17 (×9): 3 mL via INTRAVENOUS

## 2020-08-12 MED ORDER — ACETAMINOPHEN 325 MG PO TABS: 650.0000 mg | ORAL_TABLET | Freq: Four times a day (QID) | ORAL | Status: DC | PRN

## 2020-08-12 MED ORDER — METHYLPREDNISOLONE SODIUM SUCC 40 MG IJ SOLR
40.0000 mg | Freq: Four times a day (QID) | INTRAMUSCULAR | Status: AC
  Administered 2020-08-12 – 2020-08-13 (×4): 40 mg via INTRAVENOUS
  Filled 2020-08-12 (×4): qty 1

## 2020-08-12 MED ORDER — APIXABAN 5 MG PO TABS
5.0000 mg | ORAL_TABLET | Freq: Two times a day (BID) | ORAL | Status: DC
  Administered 2020-08-12 – 2020-08-17 (×11): 5 mg via ORAL
  Filled 2020-08-12 (×11): qty 1

## 2020-08-12 MED ORDER — FUROSEMIDE 20 MG PO TABS
20.0000 mg | ORAL_TABLET | Freq: Every day | ORAL | Status: DC
  Administered 2020-08-12 – 2020-08-17 (×6): 20 mg via ORAL
  Filled 2020-08-12 (×6): qty 1

## 2020-08-12 MED ORDER — IPRATROPIUM-ALBUTEROL 0.5-2.5 (3) MG/3ML IN SOLN
3.0000 mL | Freq: Four times a day (QID) | RESPIRATORY_TRACT | Status: DC
  Administered 2020-08-12 – 2020-08-13 (×6): 3 mL via RESPIRATORY_TRACT
  Filled 2020-08-12 (×6): qty 3

## 2020-08-12 MED ORDER — DILTIAZEM HCL ER COATED BEADS 120 MG PO CP24
120.0000 mg | ORAL_CAPSULE | Freq: Every day | ORAL | Status: DC
  Administered 2020-08-12 – 2020-08-17 (×6): 120 mg via ORAL
  Filled 2020-08-12 (×6): qty 1

## 2020-08-12 MED ORDER — PREDNISONE 20 MG PO TABS
40.0000 mg | ORAL_TABLET | Freq: Every day | ORAL | Status: DC
  Administered 2020-08-13: 40 mg via ORAL
  Filled 2020-08-12: qty 2

## 2020-08-12 MED ORDER — FLECAINIDE ACETATE 50 MG PO TABS
50.0000 mg | ORAL_TABLET | Freq: Two times a day (BID) | ORAL | Status: DC
  Administered 2020-08-12 – 2020-08-17 (×11): 50 mg via ORAL
  Filled 2020-08-12 (×11): qty 1

## 2020-08-12 MED ORDER — FLUTICASONE FUROATE-VILANTEROL 200-25 MCG/INH IN AEPB
1.0000 | INHALATION_SPRAY | Freq: Every day | RESPIRATORY_TRACT | Status: DC
  Administered 2020-08-13 – 2020-08-15 (×3): 1 via RESPIRATORY_TRACT
  Filled 2020-08-12: qty 28

## 2020-08-12 MED ORDER — APIXABAN 5 MG PO TABS: 5.0000 mg | ORAL_TABLET | Freq: Two times a day (BID) | ORAL | Status: DC

## 2020-08-12 MED ORDER — ALBUTEROL SULFATE (2.5 MG/3ML) 0.083% IN NEBU: 2.5000 mg | INHALATION_SOLUTION | RESPIRATORY_TRACT | Status: DC | PRN

## 2020-08-12 MED ORDER — ACETAMINOPHEN 650 MG RE SUPP: 650.0000 mg | Freq: Four times a day (QID) | RECTAL | Status: DC | PRN

## 2020-08-12 MED ORDER — PANTOPRAZOLE SODIUM 40 MG PO TBEC
40.0000 mg | DELAYED_RELEASE_TABLET | Freq: Every day | ORAL | Status: DC
  Administered 2020-08-12 – 2020-08-17 (×6): 40 mg via ORAL
  Filled 2020-08-12 (×6): qty 1

## 2020-08-12 MED ORDER — NITROGLYCERIN 0.4 MG SL SUBL: 0.4000 mg | SUBLINGUAL_TABLET | SUBLINGUAL | Status: DC | PRN

## 2020-08-12 MED ORDER — POLYETHYLENE GLYCOL 3350 17 G PO PACK
17.0000 g | PACK | Freq: Every day | ORAL | Status: DC | PRN
  Administered 2020-08-17: 17 g via ORAL
  Filled 2020-08-12 (×2): qty 1

## 2020-08-12 MED ORDER — MONTELUKAST SODIUM 10 MG PO TABS
10.0000 mg | ORAL_TABLET | Freq: Every day | ORAL | Status: DC
  Administered 2020-08-12 – 2020-08-17 (×6): 10 mg via ORAL
  Filled 2020-08-12 (×6): qty 1

## 2020-08-12 NOTE — H&P (Addendum)
History and Physical        Hospital Admission Note Date: 08/12/2020  Patient name: [name] [name] Medical record number: [id number] Date of birth: Nov 21, 1940 Age: 80 y.o. Gender: male  PCP: Gaynelle Arabian, MD    Chief Complaint    Chief Complaint  Patient presents with  . Cough      HPI:   This is an 80 year old male with past medical history of COPD, melanoma and prostate cancer, hypertension, paroxysmal A. fib on flecainide and Eliquis, OSA who presented to the ED with cough, chest congestion, shortness of breath and wheezing for 1 week.  He was seen in the urgent care on 5/29 for the symptoms and had a negative CXR at the time and was given a steroid injection, oral steroids and doxycycline.  He followed up in the urgent care on 6/4 however did not have any improvement in symptoms.  He was noted to be hypoxic at the urgent care advised to go to the ED.  While in the ED, he attempted to ambulate with the RN and O2 dropped to 87% with associated chest pain while ambulating but repeat EKG was unchanged.  Currently, he denies any fevers at home but states that his wife is at home with similar symptoms.  He says that he has had chronic shortness of breath ever since he had COVID-19 2 years ago but over the past 2 weeks or so it has become much worse with a productive cough, shortness of breath and wheezing.  He denies any history of VTE, admits to chronic lower extremity edema, says he quit tobacco use 40+ years ago and otherwise no complaints   ED Course: Afebrile, hemodynamically stable, hypoxic (SpO2 87% on room air) placed on 2 L/min.  Labs from 6/4 overall unremarkable, COVID-19 negative.  CXR-bibasilar scarring/atelectasis.  Patient received albuterol, Atrovent, Solu-Medrol, 1 L LR bolus.    Vitals:   08/12/20 0600 08/12/20 0900  BP: (!) 159/58 (!) 147/67  Pulse: 86 86   Resp: 17 16  Temp:    SpO2: 94% 96%     Review of Systems:  Review of Systems  All other systems reviewed and are negative.   Medical/Social/Family History   Past Medical History: Past Medical History:  Diagnosis Date  . Angina    chest pain- cardiac cath. followed in 2010, record available ,  told then that he should f/u /w Dr. Marisue Humble    . Arthritis    R knee, back    . Asthma    uses  singulair  . Cancer [facility])    prostate, melanoma- 2010, excision    . Chronic kidney disease    prostate cancer - surg. removal- 1996  . Colon polyps   . Diverticulitis   . Dysrhythmia    palpitations, followed by Dr.Ehinger, seen in prep for surgery on 03/27/2011   . GERD (gastroesophageal reflux disease)   . Hepatitis    jaundice- many yrs. ago  . Hiatal hernia   . Hypertension   . Melanoma ([place])    Face  . OSA (obstructive sleep apnea) 04/11/2015   Mild to moderate OSA with AHI 13.3/hr overall and AHI 36.8/hr during REM sleep.  Oxygen saturations  were as low as 86% during respiratory events.  . Pneumonia    hosp. 20 yrs. ago  . Prostate cancer ([place]) 1995  . Sleep apnea    study done, 10 yrs. ago, told that he needed  CPAP but  never used     Past Surgical History:  Procedure Laterality Date  . CARDIAC CATHETERIZATION     2010  . CARDIAC CATHETERIZATION N/A 10/22/2015   Procedure: Left Heart Cath and Coronary Angiography;  Surgeon: Nelva Bush, MD;  Location: [place] CV LAB;  Service: Cardiovascular;  Laterality: N/A;  . FRACTURE SURGERY     L wrist, hardware- 1982  . JOINT REPLACEMENT     L knee, 2009  . LEFT HEART CATHETERIZATION WITH CORONARY ANGIOGRAM N/A 09/08/2011   Procedure: LEFT HEART CATHETERIZATION WITH CORONARY ANGIOGRAM;  Surgeon: Sinclair Grooms, MD;  Location: [facility] CATH LAB;  Service: Cardiovascular;  Laterality: N/A;  . PROSTATECTOMY     for ca  . TONSILLECTOMY     as child  . TOTAL KNEE ARTHROPLASTY  04/22/2011   Procedure: TOTAL KNEE ARTHROPLASTY;   Surgeon: Garald Balding, MD;  Location: [place];  Service: Orthopedics;  Laterality: Right;    Medications: Prior to Admission medications   Medication Sig Start Date End Date Taking? Authorizing Provider  albuterol (VENTOLIN HFA) 108 (90 Base) MCG/ACT inhaler Inhale 1-2 puffs into the lungs every 6 (six) hours as needed for wheezing or shortness of breath. 02/19/20  Yes Wieters, Hallie C, PA-C  apixaban (ELIQUIS) 5 MG TABS tablet Take 1 tablet (5 mg total) by mouth 2 (two) times daily. 08/08/20  Yes Belva Crome, MD  benzonatate (TESSALON) 200 MG capsule Take 1 capsule (200 mg total) by mouth 3 (three) times daily as needed for up to 7 days for cough. 08/05/20 08/12/20 Yes Wieters, Hallie C, PA-C  budesonide-formoterol (SYMBICORT) 160-4.5 MCG/ACT inhaler Inhale 2 puffs into the lungs 2 (two) times daily. 05/22/17  Yes Juanito Doom, MD  Calcium Carbonate-Vitamin D 600-200 MG-UNIT TABS Take 1 tablet by mouth 2 (two) times daily.   Yes [provider]  dextromethorphan-guaiFENesin (MUCINEX DM) 30-600 MG 12hr tablet Take 1 tablet by mouth 2 (two) times daily. 02/19/20  Yes Wieters, Hallie C, PA-C  diltiazem (TIAZAC) 120 MG 24 hr capsule Take 120 mg by mouth daily.   Yes [provider]  doxycycline (VIBRAMYCIN) 100 MG capsule Take 1 capsule (100 mg total) by mouth 2 (two) times daily for 7 days. 08/05/20 08/12/20 Yes Wieters, Hallie C, PA-C  flecainide (TAMBOCOR) 50 MG tablet Take 1 tablet (50 mg total) by mouth 2 (two) times daily. 06/14/20  Yes Belva Crome, MD  furosemide (LASIX) 20 MG tablet Take 20 mg by mouth daily. 06/02/18  Yes [provider]  guaiFENesin-codeine 100-10 MG/5ML syrup Take 5-10 mLs by mouth at bedtime as needed for cough. 08/05/20  Yes Wieters, Hallie C, PA-C  montelukast (SINGULAIR) 10 MG tablet Take 10 mg by mouth daily before breakfast.    Yes [provider]  Multiple Vitamins tablet Take 1 tablet by mouth daily.   Yes [provider]  nitroGLYCERIN (NITROSTAT) 0.4 MG SL tablet Place 1 tablet (0.4 mg total) under the tongue every 5 (five) minutes x 3 doses as needed for chest pain. 03/18/15  Yes Kilroy, Luke K, PA-C  Omega-3 Fatty Acids (FISH OIL) 1000 MG CAPS Take 1,000 mg by mouth 2 (two) times daily.   Yes [provider]  omeprazole (PRILOSEC) 40 MG capsule Take 40 mg by mouth at bedtime.    Yes [provider]  telmisartan (MICARDIS) 80 MG tablet TAKE 1 TABLET BY MOUTH  DAILY 10/01/18  Yes Belva Crome, MD  vitamin E 1000 UNIT capsule Take 1,000 Units by mouth daily.   Yes [provider]    Allergies:   Allergies  Allergen Reactions  . Aminophylline   . Carvedilol     SOB/Fatigue  . Lisinopril Cough  . Tenormin [Atenolol] Other (See Comments)    Fatigue, low HR  . Zolpidem Tartrate Other (See Comments)    hallucinations    Social History:  reports that he quit smoking about 37 years ago. His smoking use included cigarettes. He has a 45.00 pack-year smoking history. He has never used smokeless tobacco. He reports that he does not drink alcohol and does not use drugs.  Family History: Family History  Problem Relation Age of Onset  . Crohn's disease Brother   . Breast cancer Mother   . Alzheimer's disease Father   . Heart disease Brother   . Hypertension Brother   . Anesthesia problems Neg Hx   . Hypotension Neg Hx   . Malignant hyperthermia Neg Hx   . Pseudochol deficiency Neg Hx      Objective   Physical Exam: Blood pressure (!) 147/67, pulse 86, temperature 97.7 F (36.5 C), resp. rate 16, height 5\' 7"  (1.702 m), weight 112.9 kg, SpO2 96 %.  Physical Exam Vitals and nursing note reviewed.  Constitutional:      Appearance: Normal appearance.  HENT:     Head: Normocephalic and atraumatic.  Eyes:     Conjunctiva/sclera: Conjunctivae normal.  Cardiovascular:     Rate and Rhythm: Normal rate and regular rhythm.  Pulmonary:     Breath sounds: Wheezing present.      Comments: Conversational dyspnea Abdominal:     General: Abdomen is flat.     Palpations: Abdomen is soft.  Musculoskeletal:        General: No tenderness.     Comments: Trace bilateral lower extremity edema  Skin:    Coloration: Skin is not jaundiced or pale.  Neurological:     Mental Status: He is alert. Mental status is at baseline.  Psychiatric:        Mood and Affect: Mood normal.        Behavior: Behavior normal.     LABS on Admission: I have personally reviewed all the labs and imaging below    Basic Metabolic Panel: Recent Labs  Lab 08/11/20 1537 08/12/20 0732  NA 141 142  K 3.5 3.5  CL 104 105  CO2 29 28  GLUCOSE 118* 183*  BUN 20 17  CREATININE 1.22 1.11  CALCIUM 9.0 8.7*   Liver Function Tests: No results for input(s): AST, ALT, ALKPHOS, BILITOT, PROT, ALBUMIN in the last 168 hours. No results for input(s): LIPASE, AMYLASE in the last 168 hours. No results for input(s): AMMONIA in the last 168 hours. CBC: Recent Labs  Lab 08/11/20 1537 08/12/20 0732  WBC 9.3 7.5  NEUTROABS 6.2  --   HGB 14.4 13.4  HCT 41.8 39.0  MCV 89.1 89.7  PLT 186 170   Cardiac Enzymes: No results for input(s): CKTOTAL, CKMB, CKMBINDEX, TROPONINI in the last 168 hours. BNP: Invalid input(s): POCBNP CBG: No results for input(s): GLUCAP in the last 168 hours.  Radiological Exams on Admission:  DG Chest 2 View  Result Date: 08/11/2020 CLINICAL  DATA:  Coughing and wheezing. EXAM: CHEST - 2 VIEW COMPARISON:  08/05/2020 FINDINGS: The heart size and mediastinal contours are within normal limits. Mild bibasilar scarring/atelectasis, left greater than right. There is no evidence of pulmonary edema, consolidation, pneumothorax, nodule or pleural fluid. The visualized skeletal structures are unremarkable. IMPRESSION: Bibasilar scarring/atelectasis. Electronically Signed   By: Aletta Edouard M.D.   On: 08/11/2020 15:35      EKG: sinus tachycardia, occasional PVC noted,  unifocal   A & P   Principal Problem:   Acute respiratory failure with hypoxia (HCC) Active Problems:   Hypertension   PAF (paroxysmal atrial fibrillation) (HCC)   OSA (obstructive sleep apnea)   COPD with acute exacerbation (HCC)   Anticoagulated   Prolonged QT interval   1. Acute hypoxic respiratory failure secondary to COPD exacerbation a. Scheduled duo nebs b. Continue steroids c. As needed inhalers d. Mucinex e. Today is day 7/7 of Doxycycline f. Will check a procalcitonin and if negative will hold further antibiotics g. Continue home Singulair and Symbicort changed to [facility] while inpatient h. Incentive spirometry and flutter valve  2. Paroxysmal atrial fibrillation a. Continue home Eliquis, flecainide and diltiazem  3. Hypertension a. Holding home telmisartan and continuing meds as above  4. Prolonged QT a. QT 483 ms b. Hold QT prolonging agents as able and continue telemetry  5. OSA on cpap     DVT prophylaxis: Eliquis   Code Status: Full Code  Diet: Heart healthy carb modified Family Communication: Admission, patients condition and plan of care including tests being ordered have been discussed with the patient who indicates understanding and agrees with the plan and Code Status Disposition Plan: The appropriate patient status for this patient is INPATIENT. Inpatient status is judged to be reasonable and necessary in order to provide the required intensity of service to ensure the patient's safety. The patient's presenting symptoms, physical exam findings, and initial radiographic and laboratory data in the context of their chronic comorbidities is felt to place them at high risk for further clinical deterioration. Furthermore, it is not anticipated that the patient will be medically stable for discharge from the hospital within 2 midnights of admission. The following factors support the patient status of inpatient.   " The patient's presenting symptoms  include shortness of breath. " The worrisome physical exam findings include shortness of breath wheeze. " The initial radiographic and laboratory data are worrisome because of unremarkable. " The chronic co-morbidities include COPD.   * I certify that at the point of admission it is my clinical judgment that the patient will require inpatient hospital care spanning beyond 2 midnights from the point of admission due to high intensity of service, high risk for further deterioration and high frequency of surveillance required.*   Status is: Inpatient  Remains inpatient appropriate because:IV treatments appropriate due to intensity of illness or inability to take PO and Inpatient level of care appropriate due to severity of illness   Dispo: The patient is from: Home              Anticipated d/c is to: Home              Patient currently is not medically stable to d/c.   Difficult to place patient No      Consultants  . None  Procedures  . None  Time Spent on Admission: 60 minutes    Harold Hedge, DO [facility]  08/12/2020, 11:51 AM

## 2020-08-12 NOTE — ED Provider Notes (Signed)
Patient was signed out to me by Dr. Zenia Resides.  Patient was seen in the ER for shortness of breath.  Patient has been experiencing cough and congestion for a number of days.  Patient was seen at urgent care, found to be wheezing and referred to the ER.  Patient received albuterol at arrival, was signed out to me to see how his oxygenation maintained. Physical Exam  BP (!) 151/77   Pulse 96   Temp 97.7 F (36.5 C)   Resp 18   Ht 5\' 7"  (1.702 m)   Wt 112.9 kg   SpO2 93%   BMI 39.00 kg/m   Physical Exam Vitals and nursing note reviewed.  Constitutional:      General: He is not in acute distress.    Appearance: Normal appearance. He is well-developed.  HENT:     Head: Normocephalic and atraumatic.     Right Ear: Hearing normal.     Left Ear: Hearing normal.     Nose: Nose normal.  Eyes:     Conjunctiva/sclera: Conjunctivae normal.     Pupils: Pupils are equal, round, and reactive to light.  Cardiovascular:     Rate and Rhythm: Regular rhythm. Tachycardia present.     Heart sounds: S1 normal and S2 normal. No murmur heard. No friction rub. No gallop.   Pulmonary:     Effort: Tachypnea and accessory muscle usage present.     Breath sounds: Decreased breath sounds and wheezing present.  Chest:     Chest wall: No tenderness.  Abdominal:     General: Bowel sounds are normal.     Palpations: Abdomen is soft.     Tenderness: There is no abdominal tenderness. There is no guarding or rebound. Negative signs include Murphy's sign and McBurney's sign.     Hernia: No hernia is present.  Musculoskeletal:        General: Normal range of motion.     Cervical back: Normal range of motion and neck supple.  Skin:    General: Skin is warm and dry.     Findings: No rash.  Neurological:     Mental Status: He is alert and oriented to person, place, and time.     GCS: GCS eye subscore is 4. GCS verbal subscore is 5. GCS motor subscore is 6.     Cranial Nerves: No cranial nerve deficit.      Sensory: No sensory deficit.     Coordination: Coordination normal.  Psychiatric:        Speech: Speech normal.        Behavior: Behavior normal.        Thought Content: Thought content normal.     ED Course/Procedures    Chest x-ray without pneumonia.  COVID test came back negative.  Patient placed on continuous nebulizer treatment.  He reports that he did feel somewhat better after the continuous nebulizer.  Attempts to ambulate him, however, were unsuccessful.  Patient was very shaky on his feet.  He became tachycardic and did desat.  After that he did not rest, oxygen saturations dropped down to 86% and he is not requiring supplemental oxygen by nasal cannula.  Patient also reported chest pain when he was ambulating in the room.  Repeat EKG is unchanged.  Troponin negative.  BNP negative.  Will require hospitalization.  .Critical Care Performed by: Orpah Greek, MD Authorized by: Orpah Greek, MD   Critical care provider statement:    Critical care time (minutes):  30   Critical care time was exclusive of:  Separately billable procedures and treating other patients   Critical care was necessary to treat or prevent imminent or life-threatening deterioration of the following conditions:  Respiratory failure   Critical care was time spent personally by me on the following activities:  Ordering and review of laboratory studies, pulse oximetry, re-evaluation of patient's condition, examination of patient and evaluation of patient's response to treatment   I assumed direction of critical care for this patient from another provider in my specialty: yes      MDM  Patient with acute bronchospasm secondary to upper respiratory infection.  Patient treated with bronchodilator therapy.  He is not maintaining adequate oxygenation, requiring supplemental oxygen.  Cardiac evaluation is negative.  No pneumonia on x-ray.  Admit for further management.       Orpah Greek, MD 08/12/20 [phone]

## 2020-08-12 NOTE — ED Notes (Signed)
Pt given breakfast tray

## 2020-08-12 NOTE — ED Notes (Addendum)
Patient's O2 sats consistently falling between 86-88%, placed pt on 2 L O2 via Nasal Cannula. O2 sats improved, now 94%

## 2020-08-12 NOTE — ED Notes (Signed)
Patient hesitant to ambulate, states he feels shaky and weak when walking which is not normal for him. Patient able to ambulate in room with O2 sats maintaining between 90-92%. Patient reports chest pain after ambulation.

## 2020-08-13 LAB — BASIC METABOLIC PANEL
Anion gap: 7 (ref 5–15)
BUN: 19 mg/dL (ref 8–23)
CO2: 29 mmol/L (ref 22–32)
Calcium: 9.3 mg/dL (ref 8.9–10.3)
Chloride: 106 mmol/L (ref 98–111)
Creatinine, Ser: 0.96 mg/dL (ref 0.61–1.24)
GFR, Estimated: >60 mL/min (ref >60)
Glucose, Bld: 169 mg/dL — ABNORMAL HIGH (ref 70–99)
Potassium: 4 mmol/L (ref 3.5–5.1)
Sodium: 142 mmol/L (ref 135–145)

## 2020-08-13 LAB — CBC
HCT: 40.7 % (ref 39.0–52.0)
Hemoglobin: 13.6 g/dL (ref 13.0–17.0)
MCH: 30.4 pg (ref 26.0–34.0)
MCHC: 33.4 g/dL (ref 30.0–36.0)
MCV: 90.8 fL (ref 80.0–100.0)
Platelets: 173 10*3/uL (ref 150–400)
RBC: 4.48 MIL/uL (ref 4.22–5.81)
RDW: 13.3 % (ref 11.5–15.5)
WBC: 14.4 10*3/uL — ABNORMAL HIGH (ref 4.0–10.5)
nRBC: 0 % (ref 0.0–0.2)

## 2020-08-13 MED ORDER — METHYLPREDNISOLONE SODIUM SUCC 125 MG IJ SOLR
60.0000 mg | Freq: Two times a day (BID) | INTRAMUSCULAR | Status: DC
  Administered 2020-08-13 – 2020-08-14 (×2): 60 mg via INTRAVENOUS
  Filled 2020-08-13 (×2): qty 2

## 2020-08-13 NOTE — Progress Notes (Signed)
PROGRESS NOTE    [name] [name]  JZP:[id number] DOB: Aug 29, 1940 DOA: 08/11/2020 PCP: Gaynelle Arabian, MD   Chief Complain: Cough  Brief Narrative: Patient is 57 male with with past medical history of COPD, melanoma, prostate cancer, hypertension, paroxysmal A. fib on flecainide/Eliquis, OSA who presented to the emergency room with complaints of cough, chest congestion, shortness of breath, wheezing.  He was initially seen in urgent care on 5/29, had negative chest x-ray and was given oral steroids and doxycycline.  Medication did not help at home so he presented to the ED.  On presentation he was hypoxic with oxygen on in the range of 80s.  Patient reported chronic dyspnea after he had COVID illness 2 years ago.  On present he was afebrile, hemodynamically stable.  Patient was placed on 2 L of oxygen per minute.  Admitted for the management of COPD exacerbation.  Assessment & Plan:   Principal Problem:   Acute respiratory failure with hypoxia (HCC) Active Problems:   Hypertension   PAF (paroxysmal atrial fibrillation) (HCC)   OSA (obstructive sleep apnea)   COPD with acute exacerbation (HCC)   Anticoagulated   Prolonged QT interval   Acute hypoxic respiratory failure: Secondary to COPD exacerbation.  He was hypoxic on presentation, requiring supplemental oxygen.  Chest x-ray did not show pneumonia.  He finished a course of doxycycline at home.  Continue bronchodilators, inhalers.  Continue incentive spirometry, flutter valve.  Started on steroids, scheduled duo nebs.  This morning he  was on room air.  Paroxysmal A. fib: Currently normal sinus rhythm.  On Eliquis for anticoagulation.  Also on flecainide and diltiazem.  Monitor on telemetry.  Rate controlled  Hypertension: Continue medications as ordered.  Monitor blood pressure.   Prolonged QTc: QTC of 483 on presentation.  Monitor intermittently  OSA: On CPAP at home.  Debility/deconditioning/generalized weakness: We will request  for PT/OT evaluation.  Morbid obesity: BMI of 39         DVT prophylaxis:Eliquis Code Status:Full  Family Communication: called wife on phone, call not received Status is: Inpatient  Remains inpatient appropriate because:Inpatient level of care appropriate due to severity of illness   Dispo: The patient is from: Home              Anticipated d/c is to: Home              Patient currently is not medically stable to d/c.   Difficult to place patient No     Consultants: None  Procedures:None  Antimicrobials:  Anti-infectives (From admission, onward)   Start     Dose/Rate Route Frequency Ordered Stop   08/12/20 1000  doxycycline (VIBRA-TABS) tablet 100 mg  Status:  Discontinued        100 mg Oral Every 12 hours 08/12/20 0835 08/12/20 1150      Subjective:  Patient seen and examined the bedside this morning.  Hemodynamically stable.  On room air.  He still having some cough.Significant wheezing.  States he feels better   Objective: Vitals:   08/12/20 2152 08/13/20 0154 08/13/20 0437 08/13/20 0808  BP:  (!) 161/76 (!) 165/70   Pulse:  71 72   Resp:  16 16   Temp:  98 F (36.7 C) 97.9 F (36.6 C)   TempSrc:  Oral Oral   SpO2: 99% 97% 97% 95%  Weight:      Height:        Intake/Output Summary (Last 24 hours) at 08/13/2020 4801 Last data filed  at 08/12/2020 1700 Gross per 24 hour  Intake 480 ml  Output --  Net 480 ml   Filed Weights   08/11/20 1456  Weight: 112.9 kg    Examination:  General exam: Appears calm and comfortable ,Not in distress, obese pleasant elderly male HEENT:PERRL,Oral mucosa moist, Ear/Nose normal on gross exam Respiratory system: Bilateral diminished air sounds, expiratory wheezes cardiovascular system: S1 & S2 heard, RRR. No JVD, murmurs, rubs, gallops or clicks. No pedal edema. Gastrointestinal system: Abdomen is nondistended, soft and nontender. No organomegaly or masses felt. Normal bowel sounds heard. Central nervous system: Alert  and oriented. No focal neurological deficits. Extremities: No edema, no clubbing ,no cyanosis Skin: No rashes, lesions or ulcers,no icterus ,no pallor    Data Reviewed: I have personally reviewed following labs and imaging studies  CBC: Recent Labs  Lab 08/11/20 1537 08/12/20 0732 08/13/20 0538  WBC 9.3 7.5 14.4*  NEUTROABS 6.2  --   --   HGB 14.4 13.4 13.6  HCT 41.8 39.0 40.7  MCV 89.1 89.7 90.8  PLT 186 170 588   Basic Metabolic Panel: Recent Labs  Lab 08/11/20 1537 08/12/20 0732 08/13/20 0538  NA 141 142 142  K 3.5 3.5 4.0  CL 104 105 106  CO2 29 28 29   GLUCOSE 118* 183* 169*  BUN 20 17 19   CREATININE 1.22 1.11 0.96  CALCIUM 9.0 8.7* 9.3   GFR: Estimated Creatinine Clearance: 71.2 mL/min (by C-G formula based on SCr of 0.96 mg/dL). Liver Function Tests: No results for input(s): AST, ALT, ALKPHOS, BILITOT, PROT, ALBUMIN in the last 168 hours. No results for input(s): LIPASE, AMYLASE in the last 168 hours. No results for input(s): AMMONIA in the last 168 hours. Coagulation Profile: No results for input(s): INR, PROTIME in the last 168 hours. Cardiac Enzymes: No results for input(s): CKTOTAL, CKMB, CKMBINDEX, TROPONINI in the last 168 hours. BNP (last 3 results) No results for input(s): PROBNP in the last 8760 hours. HbA1C: No results for input(s): HGBA1C in the last 72 hours. CBG: No results for input(s): GLUCAP in the last 168 hours. Lipid Profile: No results for input(s): CHOL, HDL, LDLCALC, TRIG, CHOLHDL, LDLDIRECT in the last 72 hours. Thyroid Function Tests: No results for input(s): TSH, T4TOTAL, FREET4, T3FREE, THYROIDAB in the last 72 hours. Anemia Panel: No results for input(s): VITAMINB12, FOLATE, FERRITIN, TIBC, IRON, RETICCTPCT in the last 72 hours. Sepsis Labs: Recent Labs  Lab 08/12/20 0732  PROCALCITON <0.10    Recent Results (from the past 240 hour(s))  Novel Coronavirus, NAA ([facility])     Status: None   Collection Time: 08/05/20  10:33 AM   Specimen: Nasopharyngeal Swab; Nasopharyngeal(NP) swabs in vial transport medium   Nasopharynge  Release  Result Value Ref Range Status   SARS-CoV-2, NAA Not Detected Not Detected Final    Comment: This nucleic acid amplification test was developed and its performance characteristics determined by [employer]. Nucleic acid amplification tests include RT-PCR and TMA. This test has not been FDA cleared or approved. This test has been authorized by FDA under an Emergency Use Authorization (EUA). This test is only authorized for the duration of time the declaration that circumstances exist justifying the authorization of the emergency use of in vitro diagnostic tests for detection of SARS-CoV-2 virus and/or diagnosis of COVID-19 infection under section 564(b)(1) of the Act, 21 U.S.C. 502DXA-1(O) (1), unless the authorization is terminated or revoked sooner. When diagnostic testing is negative, the possibility of a false negative result should be  considered in the context of a patient's recent exposures and the presence of clinical signs and symptoms consistent with COVID-19. An individual without symptoms of COVID-19 and who is not shedding SARS-CoV-2 virus wo uld expect to have a negative (not detected) result in this assay.   SARS-COV-2, NAA 2 DAY TAT     Status: None   Collection Time: 08/05/20 10:33 AM   Nasopharynge  Release  Result Value Ref Range Status   SARS-CoV-2, NAA 2 DAY TAT Performed  Final  Resp Panel by RT-PCR (Flu A&B, Covid) Nasopharyngeal Swab     Status: None   Collection Time: 08/11/20 10:07 PM   Specimen: Nasopharyngeal Swab; Nasopharyngeal(NP) swabs in vial transport medium  Result Value Ref Range Status   SARS Coronavirus 2 by RT PCR NEGATIVE NEGATIVE Final    Comment: (NOTE) SARS-CoV-2 target nucleic acids are NOT DETECTED.  The SARS-CoV-2 RNA is generally detectable in upper respiratory specimens during the acute phase of infection. The  lowest concentration of SARS-CoV-2 viral copies this assay can detect is 138 copies/mL. A negative result does not preclude SARS-Cov-2 infection and should not be used as the sole basis for treatment or other patient management decisions. A negative result may occur with  improper specimen collection/handling, submission of specimen other than nasopharyngeal swab, presence of viral mutation(s) within the areas targeted by this assay, and inadequate number of viral copies(<138 copies/mL). A negative result must be combined with clinical observations, patient history, and epidemiological information. The expected result is Negative.  Fact Sheet for Patients:  EntrepreneurPulse.com.au  Fact Sheet for Healthcare Providers:  IncredibleEmployment.be  This test is no t yet approved or cleared by the [place] FDA and  has been authorized for detection and/or diagnosis of SARS-CoV-2 by FDA under an Emergency Use Authorization (EUA). This EUA will remain  in effect (meaning this test can be used) for the duration of the COVID-19 declaration under Section 564(b)(1) of the Act, 21 U.S.C.section 360bbb-3(b)(1), unless the authorization is terminated  or revoked sooner.       Influenza A by PCR NEGATIVE NEGATIVE Final   Influenza B by PCR NEGATIVE NEGATIVE Final    Comment: (NOTE) The Xpert Xpress SARS-CoV-2/FLU/RSV plus assay is intended as an aid in the diagnosis of influenza from Nasopharyngeal swab specimens and should not be used as a sole basis for treatment. Nasal washings and aspirates are unacceptable for Xpert Xpress SARS-CoV-2/FLU/RSV testing.  Fact Sheet for Patients: EntrepreneurPulse.com.au  Fact Sheet for Healthcare Providers: IncredibleEmployment.be  This test is not yet approved or cleared by the [place] FDA and has been authorized for detection and/or diagnosis of SARS-CoV-2 by FDA under  an Emergency Use Authorization (EUA). This EUA will remain in effect (meaning this test can be used) for the duration of the COVID-19 declaration under Section 564(b)(1) of the Act, 21 U.S.C. section 360bbb-3(b)(1), unless the authorization is terminated or revoked.  Performed at [facility], [place] [address] [place], [place] [address]          Radiology Studies: DG Chest 2 View  Result Date: 08/11/2020 CLINICAL DATA:  Coughing and wheezing. EXAM: CHEST - 2 VIEW COMPARISON:  08/05/2020 FINDINGS: The heart size and mediastinal contours are within normal limits. Mild bibasilar scarring/atelectasis, left greater than right. There is no evidence of pulmonary edema, consolidation, pneumothorax, nodule or pleural fluid. The visualized skeletal structures are unremarkable. IMPRESSION: Bibasilar scarring/atelectasis. Electronically Signed   By: Aletta Edouard M.D.   On: 08/11/2020 15:35  Scheduled Meds: . apixaban  5 mg Oral BID  . diltiazem  120 mg Oral Daily  . flecainide  50 mg Oral BID  . fluticasone furoate-vilanterol  1 puff Inhalation Daily  . furosemide  20 mg Oral Daily  . guaiFENesin  600 mg Oral BID  . ipratropium-albuterol  3 mL Nebulization Q6H  . montelukast  10 mg Oral QAC breakfast  . pantoprazole  40 mg Oral Daily  . predniSONE  40 mg Oral Q breakfast  . sodium chloride flush  3 mL Intravenous Q12H   Continuous Infusions:   LOS: 1 day    Time spent: 35 mins.More than 50% of that time was spent in counseling and/or coordination of care.      Shelly Coss, MD [facility] P6/08/2020, 8:14 AM

## 2020-08-13 NOTE — Progress Notes (Signed)
Patient declines the use of nocturnal CPAP. HE is aware that he may call for assistance if at any time he should change his mind and wish to try.

## 2020-08-14 LAB — CBC
HCT: 41.5 % (ref 39.0–52.0)
Hemoglobin: 13.7 g/dL (ref 13.0–17.0)
MCH: 30 pg (ref 26.0–34.0)
MCHC: 33 g/dL (ref 30.0–36.0)
MCV: 90.8 fL (ref 80.0–100.0)
Platelets: 191 10*3/uL (ref 150–400)
RBC: 4.57 MIL/uL (ref 4.22–5.81)
RDW: 13.6 % (ref 11.5–15.5)
WBC: 15.7 10*3/uL — ABNORMAL HIGH (ref 4.0–10.5)
nRBC: 0 % (ref 0.0–0.2)

## 2020-08-14 LAB — BASIC METABOLIC PANEL
Anion gap: 7 (ref 5–15)
BUN: 18 mg/dL (ref 8–23)
CO2: 27 mmol/L (ref 22–32)
Calcium: 9.1 mg/dL (ref 8.9–10.3)
Chloride: 105 mmol/L (ref 98–111)
Creatinine, Ser: 1.09 mg/dL (ref 0.61–1.24)
GFR, Estimated: >60 mL/min (ref >60)
Glucose, Bld: 161 mg/dL — ABNORMAL HIGH (ref 70–99)
Potassium: 4 mmol/L (ref 3.5–5.1)
Sodium: 139 mmol/L (ref 135–145)

## 2020-08-14 MED ORDER — GUAIFENESIN ER 600 MG PO TB12
600.0000 mg | ORAL_TABLET | Freq: Two times a day (BID) | ORAL | Status: DC
  Administered 2020-08-14 – 2020-08-17 (×6): 600 mg via ORAL
  Filled 2020-08-14 (×6): qty 1

## 2020-08-14 MED ORDER — BUDESONIDE 0.5 MG/2ML IN SUSP
0.5000 mg | Freq: Two times a day (BID) | RESPIRATORY_TRACT | Status: DC
  Administered 2020-08-14 – 2020-08-17 (×7): 0.5 mg via RESPIRATORY_TRACT
  Filled 2020-08-14 (×6): qty 2

## 2020-08-14 MED ORDER — GUAIFENESIN-DM 100-10 MG/5ML PO SYRP
10.0000 mL | ORAL_SOLUTION | Freq: Four times a day (QID) | ORAL | Status: DC
  Administered 2020-08-14 – 2020-08-17 (×12): 10 mL via ORAL
  Filled 2020-08-14 (×15): qty 10

## 2020-08-14 MED ORDER — IPRATROPIUM-ALBUTEROL 0.5-2.5 (3) MG/3ML IN SOLN
3.0000 mL | Freq: Three times a day (TID) | RESPIRATORY_TRACT | Status: DC
  Administered 2020-08-14 – 2020-08-17 (×10): 3 mL via RESPIRATORY_TRACT
  Filled 2020-08-14 (×11): qty 3

## 2020-08-14 MED ORDER — METHYLPREDNISOLONE SODIUM SUCC 125 MG IJ SOLR
60.0000 mg | Freq: Three times a day (TID) | INTRAMUSCULAR | Status: DC
  Administered 2020-08-14 – 2020-08-17 (×10): 60 mg via INTRAVENOUS
  Filled 2020-08-14 (×10): qty 2

## 2020-08-14 NOTE — Evaluation (Signed)
Occupational Therapy Evaluation Patient Details Name: [name] [name] MRN: [id number] DOB: [birth year] Today's Date: 08/14/2020    History of Present Illness 36 male who presented to the emergency room with complaints of cough, chest congestion, shortness of breath, wheezing. with with past medical history of COPD, Dx of acute respiratory failure. Pt reports chronic dyspnea sincee having covid 2 years ago. PMH of melanoma, prostate cancer, hypertension, paroxysmal A. fib on flecainide/Eliquis, OSA   Clinical Impression   Patient evaluated by Occupational Therapy with no further acute OT needs identified. All education has been completed including handout and discussion/demonstration on energy conservation techniques and principles as well as pursed lip breathing, and the patient has no further questions.  See below for any follow-up Occupational Therapy or equipment needs. OT is signing off. Thank you for this referral.      Follow Up Recommendations  No OT follow up    Equipment Recommendations  None recommended by OT    Recommendations for Other Services       Precautions / Restrictions Precautions Precautions: Fall Restrictions Weight Bearing Restrictions: No      Mobility Bed Mobility               General bed mobility comments: Pt up and out of bed.    Transfers Overall transfer level: Independent                    Balance Overall balance assessment: Mild deficits observed, not formally tested                                         ADL either performed or assessed with clinical judgement   ADL Overall ADL's : At baseline                                       General ADL Comments: Pt out of bed, sitting on window bench in NAD. Pt able to demonstrate independent in-room ambulation without AD, LE dressing with increased effort which pt reports is baseline, eating and grooming independently.     Vision Baseline  Vision/History: Wears glasses Wears Glasses: At all times Patient Visual Report: Blurring of vision (Vision blurs once when coughing then resolves immediately when coughing stops.)       Perception     Praxis      Pertinent Vitals/Pain Pain Assessment: 0-10 Pain Score: 3  Pain Location: Only when coughing in the chest.     Hand Dominance Right   Extremity/Trunk Assessment Upper Extremity Assessment Upper Extremity Assessment: Overall WFL for tasks assessed       Cervical / Trunk Assessment Cervical / Trunk Assessment: Normal   Communication Communication Communication: No difficulties   Cognition Arousal/Alertness: Awake/alert Behavior During Therapy: WFL for tasks assessed/performed Overall Cognitive Status: Within Functional Limits for tasks assessed                                 General Comments: A&Ox4   General Comments       Exercises Other Exercises Other Exercises: Handouts provided for energy conservation and pursed lip breathing and pt instructed in techniques and applications.   Shoulder Instructions      Home Living Family/patient expects to be discharged  to:: Private residence Living Arrangements:  (Pt has a son who works in a nursing home and assists with DME/AE as needed.) Available Help at Discharge: Available 24 hours/day (Pt reports that his spouse is also experiencing some breathing problems.) Type of Home: House Home Access: Stairs to enter [employer] of Steps: 1 in front and 4 on the back which is the entrance pt uses. Entrance Stairs-Rails: Can reach both;Left;Right Home Layout: One level     Bathroom Shower/Tub: Occupational psychologist: Handicapped height Bathroom Accessibility: Yes How Accessible: Accessible via wheelchair Home Equipment: Pioneer - 2 wheels;Bedside commode;Walker - standard;Walker - 4 wheels;Cane - single point;Cane - quad;Wheelchair - power;Shower seat;Adaptive equipment Adaptive  Equipment: Reacher;Sock aid        Prior Functioning/Environment Level of Independence: Independent                 OT Problem List: Decreased activity tolerance      OT Treatment/Interventions:      OT Goals(Current goals can be found in the care plan section) Acute Rehab OT Goals Patient Stated Goal: "I'm going to do what I want to do." OT Goal Formulation: With patient Potential to Achieve Goals: Good ADL Goals Additional ADL Goal #1: Patient will identify at least 3 energy conservation strategies to employ at home in order to maximize function and quality of life and decrease caregiver burden while preventing exacerbation of symptoms and rehospitalization.  OT Frequency:     Barriers to D/C:            Co-evaluation              AM-PAC OT "6 Clicks" Daily Activity     Outcome Measure Help from another person eating meals?: None Help from another person taking care of personal grooming?: None Help from another person toileting, which includes using toliet, bedpan, or urinal?: None Help from another person bathing (including washing, rinsing, drying)?: None Help from another person to put on and taking off regular upper body clothing?: None Help from another person to put on and taking off regular lower body clothing?: None 6 Click Score: 24   End of Session Nurse Communication: Mobility status  Activity Tolerance: Patient tolerated treatment well Patient left:  (EOB)  OT Visit Diagnosis:  (decreased ADLs Z73.6)                Time: 3354-5625 OT Time Calculation (min): 36 min Charges:  OT General Charges $OT Visit: 1 Visit OT Evaluation $OT Eval Low Complexity: 1 Low OT Treatments $Self Care/Home Management : 8-22 mins  Anderson [place], [place] Office: [phone] 08/14/2020  Julien Girt 08/14/2020, 9:59 AM

## 2020-08-14 NOTE — Progress Notes (Signed)
Patient continues to decline nocturnal CPAP.

## 2020-08-14 NOTE — Progress Notes (Signed)
PROGRESS NOTE    CANDACE RAMUS  DZH:[id number] DOB: Nov 02, 1940 DOA: 08/11/2020 PCP: Gaynelle Arabian, MD   Chief Complain: Cough  Brief Narrative: Patient is 35 male with with past medical history of COPD, melanoma, prostate cancer, hypertension, paroxysmal A. fib on flecainide/Eliquis, OSA who presented to the emergency room with complaints of cough, chest congestion, shortness of breath, wheezing.  He was initially seen in urgent care on 5/29, had negative chest x-ray and was given oral steroids and doxycycline.  Medication did not help at home so he presented to the ED.  On presentation he was hypoxic with oxygen on in the range of 80s.  Patient reported chronic dyspnea after he had COVID illness 2 years ago.  On presentation, he was afebrile, hemodynamically stable.  Patient was placed on 2 L of oxygen per minute.  Admitted for the management of COPD exacerbation.  Assessment & Plan:   Principal Problem:   Acute respiratory failure with hypoxia (HCC) Active Problems:   Hypertension   PAF (paroxysmal atrial fibrillation) (HCC)   OSA (obstructive sleep apnea)   COPD with acute exacerbation (HCC)   Anticoagulated   Prolonged QT interval   Acute hypoxic respiratory failure: Secondary to COPD exacerbation.  He was hypoxic on presentation, requiring supplemental oxygen.  Chest x-ray did not show pneumonia.  He finished a course of doxycycline at home.  Continue bronchodilators, inhalers.  Continue incentive spirometry, flutter valve.  Started on steroids, scheduled duo nebs.  This morning he  was on room air.  Patient still has severe wheezing and cough.  Increased the dose and frequency of steroids, also added Pulmicort  Paroxysmal A. fib: Currently normal sinus rhythm.  On Eliquis for anticoagulation.  Also on flecainide and diltiazem.  Monitor on telemetry.  Rate controlled  Hypertension: Continue medications as ordered.  Monitor blood pressure.   Prolonged QTc: QTC of 483 on  presentation.  Monitor intermittently  OSA: On CPAP at home.  Generalized weakness: Much improved.  OT evaluated the patient and did not recommend any follow-up.  Morbid obesity: BMI of 39         DVT prophylaxis:Eliquis Code Status:Full  Family Communication: called wife on phone twice, call not received Status is: Inpatient  Remains inpatient appropriate because:Inpatient level of care appropriate due to severity of illness   Dispo: The patient is from: Home              Anticipated d/c is to: Home              Patient currently is not medically stable to d/c.   Difficult to place patient No     Consultants: None  Procedures:None  Antimicrobials:  Anti-infectives (From admission, onward)   Start     Dose/Rate Route Frequency Ordered Stop   08/12/20 1000  doxycycline (VIBRA-TABS) tablet 100 mg  Status:  Discontinued        100 mg Oral Every 12 hours 08/12/20 0835 08/12/20 1150      Subjective:  Patient seen and examined the bedside this morning.  Hemodynamically stable on room air.  He still having severe cough and wheezing.  Does not feel ready to go home today.  Objective: Vitals:   08/13/20 1434 08/13/20 2022 08/13/20 2043 08/14/20 0516  BP: (!) 139/59  (!) 143/65 (!) 152/77  Pulse: 62  72 66  Resp: 16  18 18   Temp: 97.8 F (36.6 C)  97.6 F (36.4 C) (!) 97.4 F (36.3 C)  TempSrc: Oral  SpO2: 97% 96% 95% 92%  Weight:      Height:        Intake/Output Summary (Last 24 hours) at 08/14/2020 0825 Last data filed at 08/13/2020 1400 Gross per 24 hour  Intake 1298 ml  Output --  Net 1298 ml   Filed Weights   08/11/20 1456  Weight: 112.9 kg    Examination:   General exam: Morbidly obese, overall comfortable, on room air HEENT: PERRL Respiratory system: Severe bilateral expiratory wheezes Cardiovascular system: S1 & S2 heard, RRR.  Gastrointestinal system: Abdomen is nondistended, soft and nontender. Central nervous system: Alert and  oriented Extremities: No edema, no clubbing ,no cyanosis Skin: No rashes, no ulcers,no icterus    Data Reviewed: I have personally reviewed following labs and imaging studies  CBC: Recent Labs  Lab 08/11/20 1537 08/12/20 0732 08/13/20 0538 08/14/20 0513  WBC 9.3 7.5 14.4* 15.7*  NEUTROABS 6.2  --   --   --   HGB 14.4 13.4 13.6 13.7  HCT 41.8 39.0 40.7 41.5  MCV 89.1 89.7 90.8 90.8  PLT 186 170 173 505   Basic Metabolic Panel: Recent Labs  Lab 08/11/20 1537 08/12/20 0732 08/13/20 0538 08/14/20 0513  NA 141 142 142 139  K 3.5 3.5 4.0 4.0  CL 104 105 106 105  CO2 29 28 29 27   GLUCOSE 118* 183* 169* 161*  BUN 20 17 19 18   CREATININE 1.22 1.11 0.96 1.09  CALCIUM 9.0 8.7* 9.3 9.1   GFR: Estimated Creatinine Clearance: 62.7 mL/min (by C-G formula based on SCr of 1.09 mg/dL). Liver Function Tests: No results for input(s): AST, ALT, ALKPHOS, BILITOT, PROT, ALBUMIN in the last 168 hours. No results for input(s): LIPASE, AMYLASE in the last 168 hours. No results for input(s): AMMONIA in the last 168 hours. Coagulation Profile: No results for input(s): INR, PROTIME in the last 168 hours. Cardiac Enzymes: No results for input(s): CKTOTAL, CKMB, CKMBINDEX, TROPONINI in the last 168 hours. BNP (last 3 results) No results for input(s): PROBNP in the last 8760 hours. HbA1C: No results for input(s): HGBA1C in the last 72 hours. CBG: No results for input(s): GLUCAP in the last 168 hours. Lipid Profile: No results for input(s): CHOL, HDL, LDLCALC, TRIG, CHOLHDL, LDLDIRECT in the last 72 hours. Thyroid Function Tests: No results for input(s): TSH, T4TOTAL, FREET4, T3FREE, THYROIDAB in the last 72 hours. Anemia Panel: No results for input(s): VITAMINB12, FOLATE, FERRITIN, TIBC, IRON, RETICCTPCT in the last 72 hours. Sepsis Labs: Recent Labs  Lab 08/12/20 0732  PROCALCITON <0.10    Recent Results (from the past 240 hour(s))  Novel Coronavirus, NAA ([facility])     Status:  None   Collection Time: 08/05/20 10:33 AM   Specimen: Nasopharyngeal Swab; Nasopharyngeal(NP) swabs in vial transport medium   Nasopharynge  Release  Result Value Ref Range Status   SARS-CoV-2, NAA Not Detected Not Detected Final    Comment: This nucleic acid amplification test was developed and its performance characteristics determined by [employer]. Nucleic acid amplification tests include RT-PCR and TMA. This test has not been FDA cleared or approved. This test has been authorized by FDA under an Emergency Use Authorization (EUA). This test is only authorized for the duration of time the declaration that circumstances exist justifying the authorization of the emergency use of in vitro diagnostic tests for detection of SARS-CoV-2 virus and/or diagnosis of COVID-19 infection under section 564(b)(1) of the Act, 21 U.S.C. 697XYI-0(X) (1), unless the authorization is terminated or revoked  sooner. When diagnostic testing is negative, the possibility of a false negative result should be considered in the context of a patient's recent exposures and the presence of clinical signs and symptoms consistent with COVID-19. An individual without symptoms of COVID-19 and who is not shedding SARS-CoV-2 virus wo uld expect to have a negative (not detected) result in this assay.   SARS-COV-2, NAA 2 DAY TAT     Status: None   Collection Time: 08/05/20 10:33 AM   Nasopharynge  Release  Result Value Ref Range Status   SARS-CoV-2, NAA 2 DAY TAT Performed  Final  Resp Panel by RT-PCR (Flu A&B, Covid) Nasopharyngeal Swab     Status: None   Collection Time: 08/11/20 10:07 PM   Specimen: Nasopharyngeal Swab; Nasopharyngeal(NP) swabs in vial transport medium  Result Value Ref Range Status   SARS Coronavirus 2 by RT PCR NEGATIVE NEGATIVE Final    Comment: (NOTE) SARS-CoV-2 target nucleic acids are NOT DETECTED.  The SARS-CoV-2 RNA is generally detectable in upper respiratory specimens during  the acute phase of infection. The lowest concentration of SARS-CoV-2 viral copies this assay can detect is 138 copies/mL. A negative result does not preclude SARS-Cov-2 infection and should not be used as the sole basis for treatment or other patient management decisions. A negative result may occur with  improper specimen collection/handling, submission of specimen other than nasopharyngeal swab, presence of viral mutation(s) within the areas targeted by this assay, and inadequate number of viral copies(<138 copies/mL). A negative result must be combined with clinical observations, patient history, and epidemiological information. The expected result is Negative.  Fact Sheet for Patients:  EntrepreneurPulse.com.au  Fact Sheet for Healthcare Providers:  IncredibleEmployment.be  This test is no t yet approved or cleared by the [place] FDA and  has been authorized for detection and/or diagnosis of SARS-CoV-2 by FDA under an Emergency Use Authorization (EUA). This EUA will remain  in effect (meaning this test can be used) for the duration of the COVID-19 declaration under Section 564(b)(1) of the Act, 21 U.S.C.section 360bbb-3(b)(1), unless the authorization is terminated  or revoked sooner.       Influenza A by PCR NEGATIVE NEGATIVE Final   Influenza B by PCR NEGATIVE NEGATIVE Final    Comment: (NOTE) The Xpert Xpress SARS-CoV-2/FLU/RSV plus assay is intended as an aid in the diagnosis of influenza from Nasopharyngeal swab specimens and should not be used as a sole basis for treatment. Nasal washings and aspirates are unacceptable for Xpert Xpress SARS-CoV-2/FLU/RSV testing.  Fact Sheet for Patients: EntrepreneurPulse.com.au  Fact Sheet for Healthcare Providers: IncredibleEmployment.be  This test is not yet approved or cleared by the [place] FDA and has been authorized for detection and/or  diagnosis of SARS-CoV-2 by FDA under an Emergency Use Authorization (EUA). This EUA will remain in effect (meaning this test can be used) for the duration of the COVID-19 declaration under Section 564(b)(1) of the Act, 21 U.S.C. section 360bbb-3(b)(1), unless the authorization is terminated or revoked.  Performed at [facility], [place] [address], [place], [place] [address]          Radiology Studies: No results found.      Scheduled Meds: . apixaban  5 mg Oral BID  . diltiazem  120 mg Oral Daily  . flecainide  50 mg Oral BID  . fluticasone furoate-vilanterol  1 puff Inhalation Daily  . furosemide  20 mg Oral Daily  . guaiFENesin  600 mg Oral BID  . ipratropium-albuterol  3 mL Nebulization TID  . methylPREDNISolone (SOLU-MEDROL) injection  60 mg Intravenous Q12H  . montelukast  10 mg Oral QAC breakfast  . pantoprazole  40 mg Oral Daily  . sodium chloride flush  3 mL Intravenous Q12H   Continuous Infusions:   LOS: 2 days    Time spent: 35 mins.More than 50% of that time was spent in counseling and/or coordination of care.      Shelly Coss, MD [facility] P6/09/2020, 8:25 AM

## 2020-08-15 ENCOUNTER — Inpatient Hospital Stay (HOSPITAL_COMMUNITY): Payer: Medicare Other

## 2020-08-15 LAB — CBC
HCT: 41 % (ref 39.0–52.0)
Hemoglobin: 13.5 g/dL (ref 13.0–17.0)
MCH: 30.2 pg (ref 26.0–34.0)
MCHC: 32.9 g/dL (ref 30.0–36.0)
MCV: 91.7 fL (ref 80.0–100.0)
Platelets: 189 10*3/uL (ref 150–400)
RBC: 4.47 MIL/uL (ref 4.22–5.81)
RDW: 13.7 % (ref 11.5–15.5)
WBC: 15.4 10*3/uL — ABNORMAL HIGH (ref 4.0–10.5)
nRBC: 0 % (ref 0.0–0.2)

## 2020-08-15 LAB — BASIC METABOLIC PANEL
Anion gap: 8 (ref 5–15)
BUN: 23 mg/dL (ref 8–23)
CO2: 27 mmol/L (ref 22–32)
Calcium: 8.9 mg/dL (ref 8.9–10.3)
Chloride: 106 mmol/L (ref 98–111)
Creatinine, Ser: 1.19 mg/dL (ref 0.61–1.24)
GFR, Estimated: >60 mL/min (ref >60)
Glucose, Bld: 199 mg/dL — ABNORMAL HIGH (ref 70–99)
Potassium: 4.1 mmol/L (ref 3.5–5.1)
Sodium: 141 mmol/L (ref 135–145)

## 2020-08-15 MED ORDER — HYDROCOD POLST-CPM POLST ER 10-8 MG/5ML PO SUER: 5.0000 mL | Freq: Two times a day (BID) | ORAL | Status: DC

## 2020-08-15 MED ORDER — HYDROCOD POLST-CPM POLST ER 10-8 MG/5ML PO SUER
5.0000 mL | Freq: Two times a day (BID) | ORAL | Status: DC
Start: 2020-08-15 — End: 2020-08-17
  Administered 2020-08-15 – 2020-08-17 (×5): 5 mL via ORAL
  Filled 2020-08-15 (×5): qty 5

## 2020-08-15 NOTE — Progress Notes (Addendum)
PROGRESS NOTE    [name] [name]  OIN:[id number] DOB: 08-10-1940 DOA: 08/11/2020 PCP: Gaynelle Arabian, MD   Chief Complain: Cough  Brief Narrative: Patient is 34 male with with past medical history of COPD, melanoma, prostate cancer, hypertension, paroxysmal A. fib on flecainide/Eliquis, OSA who presented to the emergency room with complaints of cough, chest congestion, shortness of breath, wheezing.  He was initially seen in urgent care on 5/29, had negative chest x-ray and was given oral steroids and doxycycline.  Medication did not help at home so he presented to the ED.  On presentation he was hypoxic with oxygen on in the range of 80s.  Patient reported chronic dyspnea after he had COVID illness 2 years ago.  On presentation, he was afebrile, hemodynamically stable.  Patient was placed on 2 L of oxygen per minute.  Admitted for the management of COPD exacerbation.  Assessment & Plan:   Principal Problem:   Acute respiratory failure with hypoxia (HCC) Active Problems:   Hypertension   PAF (paroxysmal atrial fibrillation) (HCC)   OSA (obstructive sleep apnea)   COPD with acute exacerbation (HCC)   Anticoagulated   Prolonged QT interval   Acute hypoxic respiratory failure: Secondary to COPD exacerbation.  He was hypoxic on presentation, requiring supplemental oxygen.  Chest x-ray did not show pneumonia.  He finished a course of doxycycline at home.  Continue bronchodilators, inhalers.  Continue incentive spirometry, flutter valve.  Started on steroids, scheduled duo nebs.  Due to persistent wheezing, we increased the dose and frequency of steroids, also added Pulmicort. He is on room air today.  He feels better better than yesterday but he still has significant cough and wheezing.  Continue IV steroids.  Added tussionex  for cough. Follow-up chest x-ray today did not show any acute changes  COPD exacerbation: continue current treatment.  Not on oxygen at home.  Past smoker.  He follows  with pulmonology as an outpatient.  Continue home inhalers on discharge.  Leukocytosis: Most likely secondary to steroids.  Continue to monitor  Paroxysmal A. fib: Currently normal sinus rhythm.  On Eliquis for anticoagulation.  Also on flecainide and diltiazem.  Monitor on telemetry.  Rate controlled  Hypertension: Continue medications as ordered.  Monitor blood pressure.  Currently blood pressure stable  OSA: On CPAP at home.  Generalized weakness: Much improved.  OT evaluated the patient and did not recommend any follow-up.  Morbid obesity: BMI of 39  History of melanoma/prostate cancer/lung cancer: Currently in remission.  He is a stage 1 RUL lung cancer ,was treated with radiation  therapy         DVT prophylaxis:Eliquis Code Status:Full  Family Communication: called wife on phone multiple times, call not received  status is: Inpatient  Remains inpatient appropriate because:Inpatient level of care appropriate due to severity of illness   Dispo: The patient is from: Home              Anticipated d/c is to: Home              Patient currently is not medically stable to d/c.   Difficult to place patient No     Consultants: None  Procedures:None  Antimicrobials:  Anti-infectives (From admission, onward)   Start     Dose/Rate Route Frequency Ordered Stop   08/12/20 1000  doxycycline (VIBRA-TABS) tablet 100 mg  Status:  Discontinued        100 mg Oral Every 12 hours 08/12/20 0835 08/12/20 1150  Subjective:  Patient seen and examined the bedside this morning.  Hemodynamically stable, on room air.  He still significantly bothered by cough and wheezing.  He definitely feels better today but does not feel ready to go home yet.  He states he might be able to go home tomorrow.  He is ambulating without any problems.  Objective: Vitals:   08/14/20 2013 08/14/20 2051 08/15/20 0503 08/15/20 0845  BP:  (!) 145/64 129/88   Pulse:  76 75   Resp:  16 18   Temp:  97.7  F (36.5 C) 97.6 F (36.4 C)   TempSrc:  Oral Oral   SpO2: 95% 93% 93% 96%  Weight:      Height:       No intake or output data in the 24 hours ending 08/15/20 1236 Filed Weights   08/11/20 1456  Weight: 112.9 kg    Examination:   General exam: Overall comfortable, not in distress, obese HEENT: PERRL Respiratory system: Severe bilateral expiratory wheezes, diminished air entry. Cardiovascular system: S1 & S2 heard, RRR.  Gastrointestinal system: Abdomen is nondistended, soft and nontender. Central nervous system: Alert and oriented Extremities: No edema, no clubbing ,no cyanosis Skin: No rashes, no ulcers,no icterus    Data Reviewed: I have personally reviewed following labs and imaging studies  CBC: Recent Labs  Lab 08/11/20 1537 08/12/20 0732 08/13/20 0538 08/14/20 0513 08/15/20 0546  WBC 9.3 7.5 14.4* 15.7* 15.4*  NEUTROABS 6.2  --   --   --   --   HGB 14.4 13.4 13.6 13.7 13.5  HCT 41.8 39.0 40.7 41.5 41.0  MCV 89.1 89.7 90.8 90.8 91.7  PLT 186 170 173 191 614   Basic Metabolic Panel: Recent Labs  Lab 08/11/20 1537 08/12/20 0732 08/13/20 0538 08/14/20 0513 08/15/20 0546  NA 141 142 142 139 141  K 3.5 3.5 4.0 4.0 4.1  CL 104 105 106 105 106  CO2 29 28 29 27 27   GLUCOSE 118* 183* 169* 161* 199*  BUN 20 17 19 18 23   CREATININE 1.22 1.11 0.96 1.09 1.19  CALCIUM 9.0 8.7* 9.3 9.1 8.9   GFR: Estimated Creatinine Clearance: 57.4 mL/min (by C-G formula based on SCr of 1.19 mg/dL). Liver Function Tests: No results for input(s): AST, ALT, ALKPHOS, BILITOT, PROT, ALBUMIN in the last 168 hours. No results for input(s): LIPASE, AMYLASE in the last 168 hours. No results for input(s): AMMONIA in the last 168 hours. Coagulation Profile: No results for input(s): INR, PROTIME in the last 168 hours. Cardiac Enzymes: No results for input(s): CKTOTAL, CKMB, CKMBINDEX, TROPONINI in the last 168 hours. BNP (last 3 results) No results for input(s): PROBNP in the last  8760 hours. HbA1C: No results for input(s): HGBA1C in the last 72 hours. CBG: No results for input(s): GLUCAP in the last 168 hours. Lipid Profile: No results for input(s): CHOL, HDL, LDLCALC, TRIG, CHOLHDL, LDLDIRECT in the last 72 hours. Thyroid Function Tests: No results for input(s): TSH, T4TOTAL, FREET4, T3FREE, THYROIDAB in the last 72 hours. Anemia Panel: No results for input(s): VITAMINB12, FOLATE, FERRITIN, TIBC, IRON, RETICCTPCT in the last 72 hours. Sepsis Labs: Recent Labs  Lab 08/12/20 0732  PROCALCITON <0.10    Recent Results (from the past 240 hour(s))  Resp Panel by RT-PCR (Flu A&B, Covid) Nasopharyngeal Swab     Status: None   Collection Time: 08/11/20 10:07 PM   Specimen: Nasopharyngeal Swab; Nasopharyngeal(NP) swabs in vial transport medium  Result Value Ref Range Status  SARS Coronavirus 2 by RT PCR NEGATIVE NEGATIVE Final    Comment: (NOTE) SARS-CoV-2 target nucleic acids are NOT DETECTED.  The SARS-CoV-2 RNA is generally detectable in upper respiratory specimens during the acute phase of infection. The lowest concentration of SARS-CoV-2 viral copies this assay can detect is 138 copies/mL. A negative result does not preclude SARS-Cov-2 infection and should not be used as the sole basis for treatment or other patient management decisions. A negative result may occur with  improper specimen collection/handling, submission of specimen other than nasopharyngeal swab, presence of viral mutation(s) within the areas targeted by this assay, and inadequate number of viral copies(<138 copies/mL). A negative result must be combined with clinical observations, patient history, and epidemiological information. The expected result is Negative.  Fact Sheet for Patients:  EntrepreneurPulse.com.au  Fact Sheet for Healthcare Providers:  IncredibleEmployment.be  This test is no t yet approved or cleared by the [place] FDA and   has been authorized for detection and/or diagnosis of SARS-CoV-2 by FDA under an Emergency Use Authorization (EUA). This EUA will remain  in effect (meaning this test can be used) for the duration of the COVID-19 declaration under Section 564(b)(1) of the Act, 21 U.S.C.section 360bbb-3(b)(1), unless the authorization is terminated  or revoked sooner.       Influenza A by PCR NEGATIVE NEGATIVE Final   Influenza B by PCR NEGATIVE NEGATIVE Final    Comment: (NOTE) The Xpert Xpress SARS-CoV-2/FLU/RSV plus assay is intended as an aid in the diagnosis of influenza from Nasopharyngeal swab specimens and should not be used as a sole basis for treatment. Nasal washings and aspirates are unacceptable for Xpert Xpress SARS-CoV-2/FLU/RSV testing.  Fact Sheet for Patients: EntrepreneurPulse.com.au  Fact Sheet for Healthcare Providers: IncredibleEmployment.be  This test is not yet approved or cleared by the [place] FDA and has been authorized for detection and/or diagnosis of SARS-CoV-2 by FDA under an Emergency Use Authorization (EUA). This EUA will remain in effect (meaning this test can be used) for the duration of the COVID-19 declaration under Section 564(b)(1) of the Act, 21 U.S.C. section 360bbb-3(b)(1), unless the authorization is terminated or revoked.  Performed at [facility], [place] [address] [place], [place] [address]          Radiology Studies: No results found.      Scheduled Meds: . apixaban  5 mg Oral BID  . budesonide (PULMICORT) nebulizer solution  0.5 mg Nebulization BID  . chlorpheniramine-HYDROcodone  5 mL Oral Q12H  . diltiazem  120 mg Oral Daily  . flecainide  50 mg Oral BID  . fluticasone furoate-vilanterol  1 puff Inhalation Daily  . furosemide  20 mg Oral Daily  . guaiFENesin  600 mg Oral BID  . guaiFENesin-dextromethorphan  10 mL Oral Q6H  . ipratropium-albuterol  3 mL Nebulization  TID  . methylPREDNISolone (SOLU-MEDROL) injection  60 mg Intravenous Q8H  . montelukast  10 mg Oral QAC breakfast  . pantoprazole  40 mg Oral Daily  . sodium chloride flush  3 mL Intravenous Q12H   Continuous Infusions:   LOS: 3 days    Time spent: 25 mins.More than 50% of that time was spent in counseling and/or coordination of care.      Shelly Coss, MD [facility] P6/10/2020, 12:36 PM

## 2020-08-15 NOTE — Plan of Care (Signed)
  Problem: Activity: Goal: Risk for activity intolerance will decrease Outcome: Progressing   

## 2020-08-15 NOTE — Care Management Important Message (Signed)
Important Message  Patient Details IM Letter given to the Patient. Name: MEHRAN GUDERIAN MRN: [id number] Date of Birth: 09-Mar-1941   Medicare Important Message Given:  Yes     Kerin Salen 08/15/2020, 10:42 AM

## 2020-08-16 LAB — CBC
HCT: 42.8 % (ref 39.0–52.0)
Hemoglobin: 14.2 g/dL (ref 13.0–17.0)
MCH: 30.5 pg (ref 26.0–34.0)
MCHC: 33.2 g/dL (ref 30.0–36.0)
MCV: 92 fL (ref 80.0–100.0)
Platelets: 167 10*3/uL (ref 150–400)
RBC: 4.65 MIL/uL (ref 4.22–5.81)
RDW: 13.3 % (ref 11.5–15.5)
WBC: 15.6 10*3/uL — ABNORMAL HIGH (ref 4.0–10.5)
nRBC: 0 % (ref 0.0–0.2)

## 2020-08-16 LAB — BASIC METABOLIC PANEL
Anion gap: 7 (ref 5–15)
BUN: 22 mg/dL (ref 8–23)
CO2: 28 mmol/L (ref 22–32)
Calcium: 8.9 mg/dL (ref 8.9–10.3)
Chloride: 104 mmol/L (ref 98–111)
Creatinine, Ser: 1.07 mg/dL (ref 0.61–1.24)
GFR, Estimated: >60 mL/min (ref >60)
Glucose, Bld: 153 mg/dL — ABNORMAL HIGH (ref 70–99)
Potassium: 4.2 mmol/L (ref 3.5–5.1)
Sodium: 139 mmol/L (ref 135–145)

## 2020-08-16 MED ORDER — POLYVINYL ALCOHOL 1.4 % OP SOLN
1.0000 [drp] | OPHTHALMIC | Status: DC | PRN
  Administered 2020-08-17: 1 [drp] via OPHTHALMIC
  Filled 2020-08-16: qty 15

## 2020-08-16 NOTE — Progress Notes (Signed)
RT NOTE:  Pt declines use of CPAP machine tonight, this RT will inform the nightshift RT.

## 2020-08-16 NOTE — Progress Notes (Signed)
PROGRESS NOTE    [name] [name]  ZLD:[id number] DOB: [birth year] DOA: 08/11/2020 PCP: Gaynelle Arabian, MD   Brief Narrative:   Patient is 80 male with with past medical history of COPD, melanoma, prostate cancer, hypertension, paroxysmal A. fib on flecainide/Eliquis, OSA who presented to the emergency room with complaints of cough, chest congestion, shortness of breath, wheezing.  He was initially seen in urgent care on 5/29, had negative chest x-ray and was given oral steroids and doxycycline.  Medication did not help at home so he presented to the ED.  On presentation he was hypoxic with oxygen on in the range of 80s.  Patient reported chronic dyspnea after he had COVID illness 2 years ago.  On presentation, he was afebrile, hemodynamically stable.  Patient was placed on 2 L of oxygen per minute.  Admitted for the management of COPD exacerbation. No RA now but still w/ significant spasm/cough.   Assessment & Plan: Acute hypoxic respiratory failure COPD exacerbation     - He was hypoxic on presentation, requiring supplemental oxygen; he is not on O2 at home     - Chest x-ray did not show pneumonia.       - He finished a course of doxycycline at home.       - Continue bronchodilators, inhalers, incentive spirometry, flutter valve, steroids, tussionex     - Follow-up chest x-ray today did not show any acute changes     - still w/ significant spasm/wheeze despite being on RA, does not appear comfortble w/ breathing, I think we can do another day or so of IV steroids  Leukocytosis     - Most likely secondary to steroids.      - Trend is now flat at 15; follow   Paroxysmal A. fib     - Currently normal sinus rhythm.     - continue eliquis, flecainide and diltiazem.   Hypertension     - continue dilt, lasix   OSA     - non-compliant w/ CPap at home; does not want it here   Generalized weakness     - Much improved.       - OT evaluated the patient and did not recommend any follow-up.    History of melanoma/prostate cancer/lung cancer     - Currently in remission.       - He is a stage 1 RUL lung cancer,was treated with radiation therapy     - continue outpt follow up  DVT prophylaxis: eliquis Code Status: FULL Family Communication: None at bedside   Status is: Inpatient  Remains inpatient appropriate because:Inpatient level of care appropriate due to severity of illness  Dispo: The patient is from: Home              Anticipated d/c is to: Home              Patient currently is not medically stable to d/c.   Difficult to place patient No  Consultants:  None  Procedures:  None  Antimicrobials:  None currently   Subjective: "I think that COVID really messed me up."  Objective: Vitals:   08/15/20 1607 08/15/20 2030 08/16/20 0515 08/16/20 0828  BP: (!) 136/50 (!) 164/75 (!) 156/65   Pulse: 71 67 63   Resp: 20 18 18    Temp: 98 F (36.7 C) 98 F (36.7 C) (!) 97.4 F (36.3 C)   TempSrc: Oral     SpO2: 95% 100% 93% 95%  Weight:  Height:       No intake or output data in the 24 hours ending 08/16/20 0840 Filed Weights   08/11/20 1456  Weight: 112.9 kg    Examination:  General: 80 y.o. male resting in bed in NAD Cardiovascular: RRR, +S1, S2, no m/g/r, equal pulses throughout Respiratory: b/l exp wheeze, with spasm;breathing still somewhat labored GI: BS+, NDNT, no masses noted, no organomegaly noted MSK: No e/c/c Neuro: A&O x 3, no focal deficits Psyc: Appropriate interaction and affect, calm/cooperative   Data Reviewed: I have personally reviewed following labs and imaging studies.  CBC: Recent Labs  Lab 08/11/20 1537 08/12/20 0732 08/13/20 0538 08/14/20 0513 08/15/20 0546 08/16/20 0539  WBC 9.3 7.5 14.4* 15.7* 15.4* 15.6*  NEUTROABS 6.2  --   --   --   --   --   HGB 14.4 13.4 13.6 13.7 13.5 14.2  HCT 41.8 39.0 40.7 41.5 41.0 42.8  MCV 89.1 89.7 90.8 90.8 91.7 92.0  PLT 186 170 173 191 189 160   Basic Metabolic  Panel: Recent Labs  Lab 08/12/20 0732 08/13/20 0538 08/14/20 0513 08/15/20 0546 08/16/20 0539  NA 142 142 139 141 139  K 3.5 4.0 4.0 4.1 4.2  CL 105 106 105 106 104  CO2 28 29 27 27 28   GLUCOSE 183* 169* 161* 199* 153*  BUN 17 19 18 23 22   CREATININE 1.11 0.96 1.09 1.19 1.07  CALCIUM 8.7* 9.3 9.1 8.9 8.9   GFR: Estimated Creatinine Clearance: 63.8 mL/min (by C-G formula based on SCr of 1.07 mg/dL). Liver Function Tests: No results for input(s): AST, ALT, ALKPHOS, BILITOT, PROT, ALBUMIN in the last 168 hours. No results for input(s): LIPASE, AMYLASE in the last 168 hours. No results for input(s): AMMONIA in the last 168 hours. Coagulation Profile: No results for input(s): INR, PROTIME in the last 168 hours. Cardiac Enzymes: No results for input(s): CKTOTAL, CKMB, CKMBINDEX, TROPONINI in the last 168 hours. BNP (last 3 results) No results for input(s): PROBNP in the last 8760 hours. HbA1C: No results for input(s): HGBA1C in the last 72 hours. CBG: No results for input(s): GLUCAP in the last 168 hours. Lipid Profile: No results for input(s): CHOL, HDL, LDLCALC, TRIG, CHOLHDL, LDLDIRECT in the last 72 hours. Thyroid Function Tests: No results for input(s): TSH, T4TOTAL, FREET4, T3FREE, THYROIDAB in the last 72 hours. Anemia Panel: No results for input(s): VITAMINB12, FOLATE, FERRITIN, TIBC, IRON, RETICCTPCT in the last 72 hours. Sepsis Labs: Recent Labs  Lab 08/12/20 0732  PROCALCITON <0.10    Recent Results (from the past 240 hour(s))  Resp Panel by RT-PCR (Flu A&B, Covid) Nasopharyngeal Swab     Status: None   Collection Time: 08/11/20 10:07 PM   Specimen: Nasopharyngeal Swab; Nasopharyngeal(NP) swabs in vial transport medium  Result Value Ref Range Status   SARS Coronavirus 2 by RT PCR NEGATIVE NEGATIVE Final    Comment: (NOTE) SARS-CoV-2 target nucleic acids are NOT DETECTED.  The SARS-CoV-2 RNA is generally detectable in upper respiratory specimens during the  acute phase of infection. The lowest concentration of SARS-CoV-2 viral copies this assay can detect is 138 copies/mL. A negative result does not preclude SARS-Cov-2 infection and should not be used as the sole basis for treatment or other patient management decisions. A negative result may occur with  improper specimen collection/handling, submission of specimen other than nasopharyngeal swab, presence of viral mutation(s) within the areas targeted by this assay, and inadequate number of viral copies(<138 copies/mL). A negative result  must be combined with clinical observations, patient history, and epidemiological information. The expected result is Negative.  Fact Sheet for Patients:  EntrepreneurPulse.com.au  Fact Sheet for Healthcare Providers:  IncredibleEmployment.be  This test is no t yet approved or cleared by the [place] FDA and  has been authorized for detection and/or diagnosis of SARS-CoV-2 by FDA under an Emergency Use Authorization (EUA). This EUA will remain  in effect (meaning this test can be used) for the duration of the COVID-19 declaration under Section 564(b)(1) of the Act, 21 U.S.C.section 360bbb-3(b)(1), unless the authorization is terminated  or revoked sooner.       Influenza A by PCR NEGATIVE NEGATIVE Final   Influenza B by PCR NEGATIVE NEGATIVE Final    Comment: (NOTE) The Xpert Xpress SARS-CoV-2/FLU/RSV plus assay is intended as an aid in the diagnosis of influenza from Nasopharyngeal swab specimens and should not be used as a sole basis for treatment. Nasal washings and aspirates are unacceptable for Xpert Xpress SARS-CoV-2/FLU/RSV testing.  Fact Sheet for Patients: EntrepreneurPulse.com.au  Fact Sheet for Healthcare Providers: IncredibleEmployment.be  This test is not yet approved or cleared by the [place] FDA and has been authorized for detection and/or  diagnosis of SARS-CoV-2 by FDA under an Emergency Use Authorization (EUA). This EUA will remain in effect (meaning this test can be used) for the duration of the COVID-19 declaration under Section 564(b)(1) of the Act, 21 U.S.C. section 360bbb-3(b)(1), unless the authorization is terminated or revoked.  Performed at [facility], [place] [address], [place], [place] [address]       Radiology Studies: DG CHEST PORT 1 VIEW  Result Date: 08/15/2020 CLINICAL DATA:  Cough, chest congestion, shortness of breath, and wheezing, acute respiratory failure, history of COPD EXAM: PORTABLE CHEST 1 VIEW COMPARISON:  Portable exam 1034 hours compared to 08/11/2020 FINDINGS: Normal heart size, mediastinal contours, and pulmonary vascularity. Emphysematous changes consistent with COPD. RIGHT upper lobe scarring unchanged. No acute infiltrate, pleural effusion, or pneumothorax. Atherosclerotic calcifications aorta. IMPRESSION: COPD changes with RIGHT upper lobe scarring. No acute abnormalities. Aortic Atherosclerosis (ICD10-I70.0) and Emphysema (ICD10-J43.9). Electronically Signed   By: Lavonia Dana M.D.   On: 08/15/2020 12:41     Scheduled Meds:  apixaban  5 mg Oral BID   budesonide (PULMICORT) nebulizer solution  0.5 mg Nebulization BID   chlorpheniramine-HYDROcodone  5 mL Oral Q12H   diltiazem  120 mg Oral Daily   flecainide  50 mg Oral BID   furosemide  20 mg Oral Daily   guaiFENesin  600 mg Oral BID   guaiFENesin-dextromethorphan  10 mL Oral Q6H   ipratropium-albuterol  3 mL Nebulization TID   methylPREDNISolone (SOLU-MEDROL) injection  60 mg Intravenous Q8H   montelukast  10 mg Oral QAC breakfast   pantoprazole  40 mg Oral Daily   sodium chloride flush  3 mL Intravenous Q12H   Continuous Infusions:   LOS: 4 days    Time spent: 30 minutes   Jonnie Finner, DO [facility]  If 7PM-7AM, please contact night-coverage [email or web address] 08/16/2020, 8:40 AM

## 2020-08-17 LAB — BASIC METABOLIC PANEL
Anion gap: 8 (ref 5–15)
BUN: 26 mg/dL — ABNORMAL HIGH (ref 8–23)
CO2: 29 mmol/L (ref 22–32)
Calcium: 9 mg/dL (ref 8.9–10.3)
Chloride: 102 mmol/L (ref 98–111)
Creatinine, Ser: 1.19 mg/dL (ref 0.61–1.24)
GFR, Estimated: >60 mL/min (ref >60)
Glucose, Bld: 173 mg/dL — ABNORMAL HIGH (ref 70–99)
Potassium: 3.8 mmol/L (ref 3.5–5.1)
Sodium: 139 mmol/L (ref 135–145)

## 2020-08-17 LAB — CBC
HCT: 41.7 % (ref 39.0–52.0)
Hemoglobin: 14 g/dL (ref 13.0–17.0)
MCH: 30.2 pg (ref 26.0–34.0)
MCHC: 33.6 g/dL (ref 30.0–36.0)
MCV: 90.1 fL (ref 80.0–100.0)
Platelets: 156 10*3/uL (ref 150–400)
RBC: 4.63 MIL/uL (ref 4.22–5.81)
RDW: 13.2 % (ref 11.5–15.5)
WBC: 16.5 10*3/uL — ABNORMAL HIGH (ref 4.0–10.5)
nRBC: 0 % (ref 0.0–0.2)

## 2020-08-17 MED ORDER — SPIRIVA HANDIHALER 18 MCG IN CAPS: 18.0000 ug | ORAL_CAPSULE | Freq: Every day | RESPIRATORY_TRACT | 2 refills | Status: DC

## 2020-08-17 MED ORDER — POLYVINYL ALCOHOL 1.4 % OP SOLN: 1.0000 [drp] | OPHTHALMIC | 0 refills | Status: AC | PRN

## 2020-08-17 MED ORDER — PREDNISONE 10 MG PO TABS: ORAL_TABLET | ORAL | 0 refills | Status: DC

## 2020-08-17 MED ORDER — POLYETHYLENE GLYCOL 3350 17 G PO PACK
17.0000 g | PACK | Freq: Every day | ORAL | 0 refills | Status: DC | PRN
Start: 2020-08-17 — End: 2020-12-21

## 2020-08-17 MED ORDER — IPRATROPIUM-ALBUTEROL 0.5-2.5 (3) MG/3ML IN SOLN: 3.0000 mL | Freq: Two times a day (BID) | RESPIRATORY_TRACT | Status: DC

## 2020-08-17 NOTE — Discharge Summary (Signed)
Physician Discharge Summary  Patient ID: [name] [name] MRN: [id number] DOB/AGE: 07-22-1940 80 y.o.  Admit date: 08/11/2020 Discharge date: 08/17/2020  Admission Diagnoses:  Patient is an 80 year old male with past medical history significant for COPD, melanoma, prostate cancer, hypertension, paroxysmal A. fib on flecainide/Eliquis, obesity and OSA.  Patient was admitted with complaints of cough, chest congestion, shortness of breath and wheezing.  Patient was initially seen at an urgent care facility on 08/05/2020, had negative chest x-ray and was given oral steroids and doxycycline.  Medication did not help at home so he presented to the ED.  On presentation to the ED, patient was hypoxic with oxygen on in the range of 80s.  Patient reported chronic dyspnea after he had COVID illness 2 years ago.  On presentation, he was afebrile, hemodynamically stable.  Patient was placed on 2 L of oxygen per minute.  Admitted for the management of COPD exacerbation. N   Acute hypoxic respiratory failure COPD exacerbation     - He was hypoxic on presentation, requiring supplemental oxygen; he was not on O2 at home     - Chest x-ray did not show pneumonia.       - He finished a course of doxycycline at home.       - Continued bronchodilators, inhalers, incentive spirometry, flutter valve, steroids, tussionex     - Follow-up chest x-ray today did not show any acute changes     -Patient has continued to progress steadily.  Patient will be discharged back home to the care of the primary care provider.  Will assess need for home oxygen need prior to discharge.   Leukocytosis     - Most likely secondary to steroids.   Paroxysmal A. fib     - Currently normal sinus rhythm.     - continue eliquis, flecainide and diltiazem.   Hypertension     - continue diltiazem and Lasix   OSA     - non-compliant w/ CPap at home; does not want it here   Generalized weakness     - Much improved.       - OT evaluated the  patient and did not recommend any follow-up.   History of melanoma/prostate cancer/lung cancer     - Currently in remission.       - He is a stage 1 RUL lung cancer,was treated with radiation therapy     - continue outpt follow up  Discharge Diagnoses:  Principal Problem:   Acute respiratory failure with hypoxia ([place]) Active Problems:   Hypertension   PAF (paroxysmal atrial fibrillation) (H[facility])   OSA (obstructive sleep apnea)   COPD with acute exacerbation ([facility])   Anticoagulated   Prolonged QT interval   Discharged Condition: stable  Hospital Course:   Consults: None   Discharge Exam: Blood pressure (!) 159/78, pulse 70, temperature 97.7 F (36.5 C), temperature source Oral, resp. rate 15, height 5\' 7"  (1.702 m), weight 112.9 kg, SpO2 96 %.   Disposition: Home  Discharge Instructions     Diet - low sodium heart healthy   Complete by: As directed    Increase activity slowly   Complete by: As directed       Allergies as of 08/17/2020       Reactions   Aminophylline    Carvedilol    SOB/Fatigue   Lisinopril Cough   Tenormin [atenolol] Other (See Comments)   Fatigue, low HR   Zolpidem Tartrate Other (See Comments)   hallucinations  Medication List     STOP taking these medications    benzonatate 200 MG capsule Commonly known as: TESSALON   dextromethorphan-guaiFENesin 30-600 MG 12hr tablet Commonly known as: MUCINEX DM   doxycycline 100 MG capsule Commonly known as: VIBRAMYCIN   Fish Oil 1000 MG Caps   guaiFENesin-codeine 100-10 MG/5ML syrup       TAKE these medications    albuterol 108 (90 Base) MCG/ACT inhaler Commonly known as: VENTOLIN HFA Inhale 1-2 puffs into the lungs every 6 (six) hours as needed for wheezing or shortness of breath.   budesonide-formoterol 160-4.5 MCG/ACT inhaler Commonly known as: Symbicort Inhale 2 puffs into the lungs 2 (two) times daily.   Calcium Carbonate-Vitamin D 600-200 MG-UNIT Tabs Take 1  tablet by mouth 2 (two) times daily.   diltiazem 120 MG 24 hr capsule Commonly known as: TIAZAC Take 120 mg by mouth daily.   Eliquis 5 MG Tabs tablet Generic drug: apixaban Take 1 tablet (5 mg total) by mouth 2 (two) times daily.   flecainide 50 MG tablet Commonly known as: TAMBOCOR Take 1 tablet (50 mg total) by mouth 2 (two) times daily.   furosemide 20 MG tablet Commonly known as: LASIX Take 20 mg by mouth daily.   montelukast 10 MG tablet Commonly known as: SINGULAIR Take 10 mg by mouth daily before breakfast.   Multiple Vitamins tablet Take 1 tablet by mouth daily.   nitroGLYCERIN 0.4 MG SL tablet Commonly known as: NITROSTAT Place 1 tablet (0.4 mg total) under the tongue every 5 (five) minutes x 3 doses as needed for chest pain.   omeprazole 40 MG capsule Commonly known as: PRILOSEC Take 40 mg by mouth at bedtime.   polyethylene glycol 17 g packet Commonly known as: MIRALAX / GLYCOLAX Take 17 g by mouth daily as needed for mild constipation.   polyvinyl alcohol 1.4 % ophthalmic solution Commonly known as: LIQUIFILM TEARS Place 1 drop into both eyes as needed for dry eyes.   predniSONE 10 MG tablet Commonly known as: DELTASONE Prednisone 60 mg po once daily for 3 days, then 40 mg po once daily for 3 days, then 30 mg po once daily for 3 days, then 20 mg po once daily for 3 days and then 10 mg po once daily for 3 days and stop.   Spiriva HandiHaler 18 MCG inhalation capsule Generic drug: tiotropium Place 1 capsule (18 mcg total) into inhaler and inhale daily.   telmisartan 80 MG tablet Commonly known as: MICARDIS TAKE 1 TABLET BY MOUTH  DAILY   vitamin E 1000 UNIT capsule Take 1,000 Units by mouth daily.         SignedBonnell Public 08/17/2020, 1:01 PM

## 2020-08-17 NOTE — Progress Notes (Signed)
Patient discharging this day. Belongings were returned to patient. Education on medication will be provided.

## 2020-08-17 NOTE — Progress Notes (Signed)
SATURATION QUALIFICATIONS: (This note is used to comply with regulatory documentation for home oxygen)  Patient Saturations on Room Air at Rest = 95%  Patient Saturations on Room Air while Ambulating = 96%  Please briefly explain why patient needs home oxygen:Patient does not desat with ambulation.

## 2020-09-19 ENCOUNTER — Telehealth: Payer: Self-pay | Admitting: Interventional Cardiology

## 2020-09-19 NOTE — Telephone Encounter (Signed)
Pt c/o swelling: STAT is pt has developed SOB within 24 hours  If swelling, where is the swelling located? Legs and feet   How much weight have you gained and in what time span?  Lost 8 lbs as of this morning, in a span of 2 weeks  Have you gained 3 pounds in a day or 5 pounds in a week? Really doesn't know   Do you have a log of your daily weights (if so, list)? No   Are you currently taking a fluid pill? Yes, doubled medication for 3 days straight due to legs being huge  Are you currently SOB? Yes, but has had that ever since having Covid. Wonders if swelling is contributing to it  Have you traveled recently? No   Fatigue to the point of hardly being able to walk to the mailbox. States he wears his compression socks. Requesting an appointment to be seen in regards to it. Reports swelling is not as severe today as it has been.

## 2020-09-19 NOTE — Telephone Encounter (Signed)
Difficult to tell if this has anything to do with his heart.  It would be nice to know if he is in sinus rhythm or atrial fib.  He should probably see his primary care physician.  An EKG can be done.  If that is not possible, hopefully he can come to our office and have an electrocardiogram performed.  I reviewed the June hospital admission and wonder if this is pulmonary related.

## 2020-09-19 NOTE — Telephone Encounter (Signed)
Spoke with pt and reviewed recommendations per Dr. Tamala Julian.  Pt states they have contacted his PCP office since being released from the hospital and were told he "didn't need to be seen" and that "they don't see sick people in the office".  Pt does not want to go to their office for eval.  He plans to get a new PCP.  Provided him with [facility] PCP number to try to find a new PCP.  Scheduled pt to be seen in AF clinic tomorrow to see if he is in AF. Advised if in rhythm, he would likely need to see his pulmonologist for work up.  Wife mentioned that he has an upcoming appt for a CT and then follow up with pulm in August to review results.  Wife very appreciative for assistance.

## 2020-09-19 NOTE — Telephone Encounter (Signed)
Left message to call back  

## 2020-09-19 NOTE — Telephone Encounter (Signed)
Wife states pt has been having issues with swelling since he was in the hospital in June.  Currently prescribed Furosemide 20mg  QD but wife gave pt 40mg  QD x 3 days last week and swelling improved quite a bit.  Pt does wear compression stockings everyday. Pt does have SOB that did not improve with extra Furosemide.  Denies CP, lightheadedness, HA.  3 days ago did have a spell of dizziness that required him to sit down for a few mins until it passed.  Vitals this morning were 133/79, 81. Vitals on Monday were 119/70, 90.  Wife states HR ranges anywhere from 60s-90s.  Asked it pt felt like he may be in AF and wife states he never knows when he is in it.  Advised I will send message to Dr. Tamala Julian for review and advisement.

## 2020-09-20 ENCOUNTER — Encounter (HOSPITAL_COMMUNITY): Payer: Self-pay | Admitting: Nurse Practitioner

## 2020-09-20 ENCOUNTER — Ambulatory Visit (HOSPITAL_COMMUNITY)
Admission: RE | Admit: 2020-09-20 | Discharge: 2020-09-20 | Disposition: A | Discharge status: Home / Self Care | Payer: Medicare Other | Source: Ambulatory Visit | Attending: Nurse Practitioner | Admitting: Nurse Practitioner

## 2020-09-20 ENCOUNTER — Other Ambulatory Visit: Payer: Self-pay

## 2020-09-20 VITALS — BP 122/50 | HR 95 | Ht 67.0 in | Wt 249.0 lb

## 2020-09-20 DIAGNOSIS — I10 Essential (primary) hypertension: Secondary | ICD-10-CM | POA: Diagnosis not present

## 2020-09-20 DIAGNOSIS — Z7951 Long term (current) use of inhaled steroids: Secondary | ICD-10-CM | POA: Insufficient documentation

## 2020-09-20 DIAGNOSIS — R531 Weakness: Secondary | ICD-10-CM

## 2020-09-20 DIAGNOSIS — Z6839 Body mass index (BMI) 39.0-39.9, adult: Secondary | ICD-10-CM | POA: Diagnosis not present

## 2020-09-20 DIAGNOSIS — R609 Edema, unspecified: Secondary | ICD-10-CM | POA: Insufficient documentation

## 2020-09-20 DIAGNOSIS — I498 Other specified cardiac arrhythmias: Secondary | ICD-10-CM | POA: Diagnosis not present

## 2020-09-20 DIAGNOSIS — Z8249 Family history of ischemic heart disease and other diseases of the circulatory system: Secondary | ICD-10-CM | POA: Insufficient documentation

## 2020-09-20 DIAGNOSIS — I4510 Unspecified right bundle-branch block: Secondary | ICD-10-CM | POA: Insufficient documentation

## 2020-09-20 DIAGNOSIS — E669 Obesity, unspecified: Secondary | ICD-10-CM | POA: Insufficient documentation

## 2020-09-20 DIAGNOSIS — Z85118 Personal history of other malignant neoplasm of bronchus and lung: Secondary | ICD-10-CM | POA: Diagnosis not present

## 2020-09-20 DIAGNOSIS — R0600 Dyspnea, unspecified: Secondary | ICD-10-CM | POA: Insufficient documentation

## 2020-09-20 DIAGNOSIS — J449 Chronic obstructive pulmonary disease, unspecified: Secondary | ICD-10-CM | POA: Insufficient documentation

## 2020-09-20 DIAGNOSIS — Z7901 Long term (current) use of anticoagulants: Secondary | ICD-10-CM | POA: Diagnosis not present

## 2020-09-20 DIAGNOSIS — R0902 Hypoxemia: Secondary | ICD-10-CM

## 2020-09-20 DIAGNOSIS — Z87891 Personal history of nicotine dependence: Secondary | ICD-10-CM | POA: Diagnosis not present

## 2020-09-20 DIAGNOSIS — Z79899 Other long term (current) drug therapy: Secondary | ICD-10-CM | POA: Diagnosis not present

## 2020-09-20 DIAGNOSIS — I480 Paroxysmal atrial fibrillation: Secondary | ICD-10-CM | POA: Diagnosis not present

## 2020-09-20 LAB — COMPREHENSIVE METABOLIC PANEL
ALT: 12 U/L (ref 0–44)
AST: 22 U/L (ref 15–41)
Albumin: 3.2 g/dL — ABNORMAL LOW (ref 3.5–5.0)
Alkaline Phosphatase: 71 U/L (ref 38–126)
Anion gap: 8 (ref 5–15)
BUN: 8 mg/dL (ref 8–23)
CO2: 29 mmol/L (ref 22–32)
Calcium: 8.9 mg/dL (ref 8.9–10.3)
Chloride: 102 mmol/L (ref 98–111)
Creatinine, Ser: 1.27 mg/dL — ABNORMAL HIGH (ref 0.61–1.24)
GFR, Estimated: 56 mL/min — ABNORMAL LOW (ref >60)
Glucose, Bld: 107 mg/dL — ABNORMAL HIGH (ref 70–99)
Potassium: 3.5 mmol/L (ref 3.5–5.1)
Sodium: 139 mmol/L (ref 135–145)
Total Bilirubin: 1.3 mg/dL — ABNORMAL HIGH (ref 0.3–1.2)
Total Protein: 6.3 g/dL — ABNORMAL LOW (ref 6.5–8.1)

## 2020-09-20 LAB — CBC
HCT: 35.6 % — ABNORMAL LOW (ref 39.0–52.0)
Hemoglobin: 12 g/dL — ABNORMAL LOW (ref 13.0–17.0)
MCH: 30.5 pg (ref 26.0–34.0)
MCHC: 33.7 g/dL (ref 30.0–36.0)
MCV: 90.4 fL (ref 80.0–100.0)
Platelets: 233 10*3/uL (ref 150–400)
RBC: 3.94 MIL/uL — ABNORMAL LOW (ref 4.22–5.81)
RDW: 13.8 % (ref 11.5–15.5)
WBC: 7.1 10*3/uL (ref 4.0–10.5)
nRBC: 0 % (ref 0.0–0.2)

## 2020-09-20 LAB — TSH: TSH: 0.919 u[IU]/mL (ref 0.350–4.500)

## 2020-09-20 MED ORDER — POTASSIUM CHLORIDE CRYS ER 20 MEQ PO TBCR: 20.0000 meq | EXTENDED_RELEASE_TABLET | Freq: Every day | ORAL | 1 refills | Status: DC

## 2020-09-20 MED ORDER — FUROSEMIDE 20 MG PO TABS: ORAL_TABLET | ORAL | 1 refills | Status: DC

## 2020-09-20 NOTE — Progress Notes (Signed)
Primary Care Physician: Gaynelle Arabian, MD Referring Physician: Dr. Marrion [address] triage    [name] [name] is a 80 y.o. male with a h/o past medical history significant for COPD, Lung CA, melanoma, prostate cancer, hypertension, paroxysmal A. fib on flecainide/Eliquis, obesity and OSA.  Patient was admitted in June with complaints of cough, chest congestion, shortness of breath and wheezing.  Patient was initially seen at an urgent care facility on 08/05/2020, had negative chest x-ray and was given oral steroids and doxycycline.  Medication did not help at home so he presented to the ED.  On presentation to the ED, patient was hypoxic with oxygen on in the range of 80s.  Patient reported chronic dyspnea after he had COVID illness 2 years ago.  Pt is being seen in afib clinic today  for calling to [facility] street with shortness of breath and pedal edema. Ekg was wanted to make sure he was not in afib. His wife did give extra lasix (40 mg) x 3 days with help with pedal edema. He reports that he has continued to feel shortness of breath after hospitalization. He has a pulmonologist in [facility] that he has not seen since d/c but is due to see him in August.   EKG today shows SR with PAC/PVC. On flecainide. Pt reports that he feels decent at rest but as soon as he walks, he gets weak, dizzy and winded. He can rest for a short period of time and then he will feel better and can continue. Some days he can walk longer than others. He is not on O2 at home.Fluid weight is stable. States he feels he is losing weight.   I walked him with pulse ox. He was at 94% at rest. With walking, he became short of breath. His Pulse Ox was noted at 88% and his HR jumped briefly to 155 bpm. As soon as he stopped for a few seconds, his HR dropped back to normal range and his O2 increased to 92%. He feels that since his covid and lung CA dx, his breathing has not gotten back to baseline. He has knee high compression  socks on with trace edema on rt and 1+ on left. Edema better  in the am. He has slept in a recliner x 7 years.no issues with PND/orthopnea.. He has  untreated sleep apnea  His wife states that since he has been home form the hospital he wants to sit more and this is not like him.  Today, he denies symptoms of palpitations, chest pain, shortness of breath, orthopnea, PND, lower extremity edema, dizziness, presyncope, syncope, or neurologic sequela. The patient is tolerating medications without difficulties and is otherwise without complaint today.   Past Medical History:  Diagnosis Date   Angina    chest pain- cardiac cath. followed in 2010, record available ,  told then that he should f/u /w Dr. Marisue Humble     Arthritis    R knee, back     Asthma    uses  singulair   Cancer [facility])    prostate, melanoma- 2010, excision     Chronic kidney disease    prostate cancer - surg. removal- 1996   Colon polyps    Diverticulitis    Dysrhythmia    palpitations, followed by Dr.Ehinger, seen in prep for surgery on 03/27/2011    GERD (gastroesophageal reflux disease)    Hepatitis    jaundice- many yrs. ago   Hiatal hernia    Hypertension  Melanoma ([place])    Face   OSA (obstructive sleep apnea) 04/11/2015   Mild to moderate OSA with AHI 13.3/hr overall and AHI 36.8/hr during REM sleep.  Oxygen saturations were as low as 86% during respiratory events.   Pneumonia    hosp. 20 yrs. ago   Prostate cancer ([place]) 1995   Sleep apnea    study done, 10 yrs. ago, told that he needed  CPAP but  never used    Past Surgical History:  Procedure Laterality Date   CARDIAC CATHETERIZATION     2010   CARDIAC CATHETERIZATION N/A 10/22/2015   Procedure: Left Heart Cath and Coronary Angiography;  Surgeon: Nelva Bush, MD;  Location: [place] CV LAB;  Service: Cardiovascular;  Laterality: N/A;   FRACTURE SURGERY     L wrist, hardware- 1982   JOINT REPLACEMENT     L knee, 2009   LEFT HEART CATHETERIZATION WITH  CORONARY ANGIOGRAM N/A 09/08/2011   Procedure: LEFT HEART CATHETERIZATION WITH CORONARY ANGIOGRAM;  Surgeon: Sinclair Grooms, MD;  Location: [facility] CATH LAB;  Service: Cardiovascular;  Laterality: N/A;   PROSTATECTOMY     for ca   TONSILLECTOMY     as child   TOTAL KNEE ARTHROPLASTY  04/22/2011   Procedure: TOTAL KNEE ARTHROPLASTY;  Surgeon: Garald Balding, MD;  Location: [place];  Service: Orthopedics;  Laterality: Right;    Current Outpatient Medications  Medication Sig Dispense Refill   albuterol (VENTOLIN HFA) 108 (90 Base) MCG/ACT inhaler Inhale 1-2 puffs into the lungs every 6 (six) hours as needed for wheezing or shortness of breath. 18 g 0   apixaban (ELIQUIS) 5 MG TABS tablet Take 1 tablet (5 mg total) by mouth 2 (two) times daily. 180 tablet 1   budesonide-formoterol (SYMBICORT) 160-4.5 MCG/ACT inhaler Inhale 2 puffs into the lungs 2 (two) times daily. 2 Inhaler 0   Calcium Carbonate-Vitamin D 600-200 MG-UNIT TABS Take 1 tablet by mouth 2 (two) times daily.     diltiazem (TIAZAC) 120 MG 24 hr capsule Take 120 mg by mouth daily.     flecainide (TAMBOCOR) 50 MG tablet Take 1 tablet (50 mg total) by mouth 2 (two) times daily. 180 tablet 1   montelukast (SINGULAIR) 10 MG tablet Take 10 mg by mouth daily before breakfast.      Multiple Vitamins tablet Take 1 tablet by mouth daily.     neomycin-polymyxin b-dexamethasone (MAXITROL) 3.5-10000-0.1 OINT SMARTSIG:In Eye(s)     omeprazole (PRILOSEC) 40 MG capsule Take 40 mg by mouth at bedtime.      polyethylene glycol (MIRALAX / GLYCOLAX) 17 g packet Take 17 g by mouth daily as needed for mild constipation. 14 each 0   polyvinyl alcohol (LIQUIFILM TEARS) 1.4 % ophthalmic solution Place 1 drop into both eyes as needed for dry eyes. 15 mL 0   telmisartan (MICARDIS) 80 MG tablet TAKE 1 TABLET BY MOUTH  DAILY 90 tablet 3   vitamin E 1000 UNIT capsule Take 1,000 Units by mouth daily.     furosemide (LASIX) 20 MG tablet Take 20mg  daily with extra 20mg   (total of 40mg ) every other day 60 tablet 1   No current facility-administered medications for this encounter.    Allergies  Allergen Reactions   Aminophylline    Carvedilol     SOB/Fatigue   Lisinopril Cough   Tenormin [Atenolol] Other (See Comments)    Fatigue, low HR   Zolpidem Tartrate Other (See Comments)    hallucinations  Social History   Socioeconomic History   Marital status: Married    Spouse name: Not on file   Number of children: Not on file   Years of education: Not on file   Highest education level: Not on file  Occupational History   Not on file  Tobacco Use   Smoking status: Former    Packs/day: 1.50    Years: 30.00    Pack years: 45.00    Types: Cigarettes    Quit date: 04/11/1983    Years since quitting: 37.4   Smokeless tobacco: Never  Vaping Use   Vaping Use: Never used  Substance and Sexual Activity   Alcohol use: No   Drug use: No   Sexual activity: Not on file  Other Topics Concern   Not on file  Social History Narrative   Not on file   Social Determinants of Health   Financial Resource Strain: Not on file  Food Insecurity: Not on file  Transportation Needs: Not on file  Physical Activity: Not on file  Stress: Not on file  Social Connections: Not on file  Intimate Partner Violence: Not on file    Family History  Problem Relation Age of Onset   Crohn's disease Brother    Breast cancer Mother    Alzheimer's disease Father    Heart disease Brother    Hypertension Brother    Anesthesia problems Neg Hx    Hypotension Neg Hx    Malignant hyperthermia Neg Hx    Pseudochol deficiency Neg Hx     ROS- All systems are reviewed and negative except as per the HPI above  Physical Exam: Vitals:   09/20/20 1412  BP: (!) 122/50  Pulse: 95  Weight: 112.9 kg  Height: 5\' 7"  (1.702 m)   Wt Readings from Last 3 Encounters:  09/20/20 112.9 kg  08/11/20 112.9 kg  11/23/19 115.3 kg    Labs: Lab Results  Component Value Date   NA  139 08/17/2020   K 3.8 08/17/2020   CL 102 08/17/2020   CO2 29 08/17/2020   GLUCOSE 173 (H) 08/17/2020   BUN 26 (H) 08/17/2020   CREATININE 1.19 08/17/2020   CALCIUM 9.0 08/17/2020   MG 2.0 03/17/2015   Lab Results  Component Value Date   INR 1.1 10/17/2015   Lab Results  Component Value Date   CHOL 159 06/14/2019   HDL 40 06/14/2019   LDLCALC 102 (H) 06/14/2019   TRIG 89 06/14/2019     GEN- The patient is well appearing, alert and oriented x 3 today.   Head- normocephalic, atraumatic Eyes-  Sclera clear, conjunctiva pink Ears- hearing intact Oropharynx- clear Neck- supple, no JVP Lymph- no cervical lymphadenopathy Lungs- Clear to ausculation bilaterally, normal work of breathing Heart- Regular rate and rhythm, no murmurs, rubs or gallops, PMI not laterally displaced GI- soft, NT, ND, + BS Extremities- no clubbing, cyanosis, or  trace rt /1+ left with knee high support socks  MS- no significant deformity or atrophy Skin- no rash or lesion Psych- euthymic mood, full affect Neuro- strength and sensation are intact  EKG- sinus rhythm with PAC/PVC Pr int 184 ms, qrs int 94 ms, qtc 469 bpm Epic records reviewed    Assessment and Plan:  1. Paroxsymal afib He is in rhythm today  Continue flecainide 50 mg bid  Continue eliquis 5 mg bid for a [id number]  score of at least 3  2.Exertional dyspnea with lightheadedness/hypoxia  Pt became hypoxic with O2 sat  of 88% of walking  less than one minute with increase of HR  to 150's  This quickly returned to baseline with just a few seconds of rest with O2 improving to 92% I feel his symptoms are from hypoxia 2/2 to his lung disease I asked his wife to move up his appointment in August  with his pulmonologist  3. Pedal edema  ? Possibly rt side Heart failure  Last echo did show moderately elevated pulmonary systolic pressure  Update echo  Alternate 20 mg lasix with 40 mg lasix and recheck labs in 10 days, wife did see  improvement in pedal edema with increased diuretic dose  Avoid salt, elevate feet when siting  May need K+ replacement Will assess labs today for that possibility  Cbc/cmet/tsh   F/u with pulmonologist sooner than August  Also gave names of PCP groups in [place] as he has become frustrated with PCP as he states he does not treat sick people   Geroge Baseman. Lorena Benham, [facility] [address] [place], [place] [address] [phone]

## 2020-09-20 NOTE — Patient Instructions (Signed)
Lasix -- take 20mg  daily -- every other day take an extra 20mg  to total 40mg 

## 2020-09-20 NOTE — Addendum Note (Signed)
Encounter addended by: Juluis Mire, RN on: 09/20/2020 4:55 PM  Actions taken: Order list changed, Diagnosis association updated

## 2020-09-26 ENCOUNTER — Telehealth: Payer: Self-pay

## 2020-09-27 DIAGNOSIS — I10 Essential (primary) hypertension: Secondary | ICD-10-CM | POA: Diagnosis not present

## 2020-09-27 DIAGNOSIS — K219 Gastro-esophageal reflux disease without esophagitis: Secondary | ICD-10-CM | POA: Diagnosis not present

## 2020-09-27 DIAGNOSIS — J45909 Unspecified asthma, uncomplicated: Secondary | ICD-10-CM | POA: Diagnosis not present

## 2020-09-27 DIAGNOSIS — J449 Chronic obstructive pulmonary disease, unspecified: Secondary | ICD-10-CM | POA: Diagnosis not present

## 2020-09-27 DIAGNOSIS — Z85118 Personal history of other malignant neoplasm of bronchus and lung: Secondary | ICD-10-CM | POA: Diagnosis not present

## 2020-09-27 DIAGNOSIS — I4891 Unspecified atrial fibrillation: Secondary | ICD-10-CM | POA: Diagnosis not present

## 2020-09-27 DIAGNOSIS — C3411 Malignant neoplasm of upper lobe, right bronchus or lung: Secondary | ICD-10-CM | POA: Diagnosis not present

## 2020-10-08 ENCOUNTER — Ambulatory Visit (HOSPITAL_COMMUNITY)
Admission: RE | Admit: 2020-10-08 | Discharge: 2020-10-08 | Disposition: A | Discharge status: Home / Self Care | Payer: Medicare Other | Source: Ambulatory Visit | Attending: Physician Assistant | Admitting: Physician Assistant

## 2020-10-08 ENCOUNTER — Other Ambulatory Visit: Payer: Self-pay

## 2020-10-08 DIAGNOSIS — R079 Chest pain, unspecified: Secondary | ICD-10-CM | POA: Diagnosis not present

## 2020-10-08 DIAGNOSIS — I129 Hypertensive chronic kidney disease with stage 1 through stage 4 chronic kidney disease, or unspecified chronic kidney disease: Secondary | ICD-10-CM | POA: Insufficient documentation

## 2020-10-08 DIAGNOSIS — I480 Paroxysmal atrial fibrillation: Secondary | ICD-10-CM | POA: Diagnosis not present

## 2020-10-08 DIAGNOSIS — J449 Chronic obstructive pulmonary disease, unspecified: Secondary | ICD-10-CM | POA: Insufficient documentation

## 2020-10-08 DIAGNOSIS — N189 Chronic kidney disease, unspecified: Secondary | ICD-10-CM | POA: Diagnosis not present

## 2020-10-08 DIAGNOSIS — G4730 Sleep apnea, unspecified: Secondary | ICD-10-CM | POA: Insufficient documentation

## 2020-10-08 DIAGNOSIS — Z87891 Personal history of nicotine dependence: Secondary | ICD-10-CM | POA: Diagnosis not present

## 2020-10-08 LAB — CBC
HCT: 38.3 % — ABNORMAL LOW (ref 39.0–52.0)
Hemoglobin: 12.5 g/dL — ABNORMAL LOW (ref 13.0–17.0)
MCH: 29.9 pg (ref 26.0–34.0)
MCHC: 32.6 g/dL (ref 30.0–36.0)
MCV: 91.6 fL (ref 80.0–100.0)
Platelets: 170 10*3/uL (ref 150–400)
RBC: 4.18 MIL/uL — ABNORMAL LOW (ref 4.22–5.81)
RDW: 14.5 % (ref 11.5–15.5)
WBC: 6.8 10*3/uL (ref 4.0–10.5)
nRBC: 0 % (ref 0.0–0.2)

## 2020-10-08 LAB — ECHOCARDIOGRAM COMPLETE
Area-P 1/2: 3.24 cm2
Calc EF: 54.9 %
S' Lateral: 3.9 cm
Single Plane A2C EF: 53 %
Single Plane A4C EF: 57.8 %

## 2020-10-08 LAB — BASIC METABOLIC PANEL
Anion gap: 9 (ref 5–15)
BUN: 14 mg/dL (ref 8–23)
CO2: 31 mmol/L (ref 22–32)
Calcium: 9.4 mg/dL (ref 8.9–10.3)
Chloride: 103 mmol/L (ref 98–111)
Creatinine, Ser: 1.28 mg/dL — ABNORMAL HIGH (ref 0.61–1.24)
GFR, Estimated: 56 mL/min — ABNORMAL LOW (ref >60)
Glucose, Bld: 103 mg/dL — ABNORMAL HIGH (ref 70–99)
Potassium: 4.1 mmol/L (ref 3.5–5.1)
Sodium: 143 mmol/L (ref 135–145)

## 2020-10-08 NOTE — Progress Notes (Signed)
  Echocardiogram 2D Echocardiogram has been performed.  Geoffery Lyons [place] 10/08/2020, 1:30 PM

## 2020-10-11 DIAGNOSIS — R0609 Other forms of dyspnea: Secondary | ICD-10-CM | POA: Diagnosis not present

## 2020-10-11 DIAGNOSIS — Z923 Personal history of irradiation: Secondary | ICD-10-CM | POA: Diagnosis not present

## 2020-10-11 DIAGNOSIS — R911 Solitary pulmonary nodule: Secondary | ICD-10-CM | POA: Diagnosis not present

## 2020-10-11 DIAGNOSIS — R0600 Dyspnea, unspecified: Secondary | ICD-10-CM | POA: Diagnosis not present

## 2020-10-14 ENCOUNTER — Emergency Department (HOSPITAL_COMMUNITY): Payer: Medicare Other

## 2020-10-14 ENCOUNTER — Encounter (HOSPITAL_COMMUNITY): Payer: Self-pay | Admitting: *Deleted

## 2020-10-14 ENCOUNTER — Other Ambulatory Visit: Payer: Self-pay

## 2020-10-14 ENCOUNTER — Inpatient Hospital Stay (HOSPITAL_COMMUNITY)
Admission: EM | Admit: 2020-10-14 | Discharge: 2020-10-19 | DRG: 184 | Disposition: A | Discharge status: Home Health | Payer: Medicare Other | Attending: Surgery | Admitting: Surgery

## 2020-10-14 DIAGNOSIS — J9811 Atelectasis: Secondary | ICD-10-CM | POA: Diagnosis not present

## 2020-10-14 DIAGNOSIS — M5126 Other intervertebral disc displacement, lumbar region: Secondary | ICD-10-CM | POA: Diagnosis not present

## 2020-10-14 DIAGNOSIS — S2242XA Multiple fractures of ribs, left side, initial encounter for closed fracture: Principal | ICD-10-CM | POA: Diagnosis present

## 2020-10-14 DIAGNOSIS — M545 Low back pain, unspecified: Secondary | ICD-10-CM | POA: Diagnosis not present

## 2020-10-14 DIAGNOSIS — R110 Nausea: Secondary | ICD-10-CM | POA: Diagnosis not present

## 2020-10-14 DIAGNOSIS — S2249XA Multiple fractures of ribs, unspecified side, initial encounter for closed fracture: Secondary | ICD-10-CM | POA: Diagnosis not present

## 2020-10-14 DIAGNOSIS — Z8616 Personal history of COVID-19: Secondary | ICD-10-CM

## 2020-10-14 DIAGNOSIS — Z87891 Personal history of nicotine dependence: Secondary | ICD-10-CM | POA: Diagnosis not present

## 2020-10-14 DIAGNOSIS — R6889 Other general symptoms and signs: Secondary | ICD-10-CM | POA: Diagnosis not present

## 2020-10-14 DIAGNOSIS — R34 Anuria and oliguria: Secondary | ICD-10-CM | POA: Diagnosis not present

## 2020-10-14 DIAGNOSIS — M542 Cervicalgia: Secondary | ICD-10-CM | POA: Diagnosis not present

## 2020-10-14 DIAGNOSIS — Z7901 Long term (current) use of anticoagulants: Secondary | ICD-10-CM

## 2020-10-14 DIAGNOSIS — Z888 Allergy status to other drugs, medicaments and biological substances status: Secondary | ICD-10-CM

## 2020-10-14 DIAGNOSIS — J90 Pleural effusion, not elsewhere classified: Secondary | ICD-10-CM | POA: Diagnosis not present

## 2020-10-14 DIAGNOSIS — J984 Other disorders of lung: Secondary | ICD-10-CM | POA: Diagnosis not present

## 2020-10-14 DIAGNOSIS — K5790 Diverticulosis of intestine, part unspecified, without perforation or abscess without bleeding: Secondary | ICD-10-CM | POA: Diagnosis not present

## 2020-10-14 DIAGNOSIS — R079 Chest pain, unspecified: Secondary | ICD-10-CM | POA: Diagnosis not present

## 2020-10-14 DIAGNOSIS — W109XXA Fall (on) (from) unspecified stairs and steps, initial encounter: Secondary | ICD-10-CM | POA: Diagnosis present

## 2020-10-14 DIAGNOSIS — I509 Heart failure, unspecified: Secondary | ICD-10-CM | POA: Diagnosis present

## 2020-10-14 DIAGNOSIS — I4819 Other persistent atrial fibrillation: Secondary | ICD-10-CM | POA: Diagnosis not present

## 2020-10-14 DIAGNOSIS — S0003XA Contusion of scalp, initial encounter: Secondary | ICD-10-CM | POA: Diagnosis not present

## 2020-10-14 DIAGNOSIS — I110 Hypertensive heart disease with heart failure: Secondary | ICD-10-CM | POA: Diagnosis not present

## 2020-10-14 DIAGNOSIS — R519 Headache, unspecified: Secondary | ICD-10-CM | POA: Diagnosis not present

## 2020-10-14 DIAGNOSIS — I10 Essential (primary) hypertension: Secondary | ICD-10-CM | POA: Diagnosis not present

## 2020-10-14 DIAGNOSIS — S0990XA Unspecified injury of head, initial encounter: Secondary | ICD-10-CM | POA: Diagnosis not present

## 2020-10-14 DIAGNOSIS — S3992XA Unspecified injury of lower back, initial encounter: Secondary | ICD-10-CM | POA: Diagnosis not present

## 2020-10-14 DIAGNOSIS — M25512 Pain in left shoulder: Secondary | ICD-10-CM | POA: Diagnosis present

## 2020-10-14 DIAGNOSIS — N281 Cyst of kidney, acquired: Secondary | ICD-10-CM | POA: Diagnosis not present

## 2020-10-14 DIAGNOSIS — S3991XA Unspecified injury of abdomen, initial encounter: Secondary | ICD-10-CM | POA: Diagnosis not present

## 2020-10-14 DIAGNOSIS — Z79899 Other long term (current) drug therapy: Secondary | ICD-10-CM | POA: Diagnosis not present

## 2020-10-14 DIAGNOSIS — R52 Pain, unspecified: Secondary | ICD-10-CM

## 2020-10-14 DIAGNOSIS — K429 Umbilical hernia without obstruction or gangrene: Secondary | ICD-10-CM | POA: Diagnosis not present

## 2020-10-14 DIAGNOSIS — I517 Cardiomegaly: Secondary | ICD-10-CM | POA: Diagnosis not present

## 2020-10-14 DIAGNOSIS — I2510 Atherosclerotic heart disease of native coronary artery without angina pectoris: Secondary | ICD-10-CM | POA: Diagnosis not present

## 2020-10-14 DIAGNOSIS — S0101XA Laceration without foreign body of scalp, initial encounter: Secondary | ICD-10-CM | POA: Diagnosis present

## 2020-10-14 DIAGNOSIS — M549 Dorsalgia, unspecified: Secondary | ICD-10-CM | POA: Diagnosis not present

## 2020-10-14 DIAGNOSIS — I499 Cardiac arrhythmia, unspecified: Secondary | ICD-10-CM | POA: Diagnosis not present

## 2020-10-14 DIAGNOSIS — Z20822 Contact with and (suspected) exposure to covid-19: Secondary | ICD-10-CM | POA: Diagnosis not present

## 2020-10-14 DIAGNOSIS — Z743 Need for continuous supervision: Secondary | ICD-10-CM | POA: Diagnosis not present

## 2020-10-14 DIAGNOSIS — T1490XA Injury, unspecified, initial encounter: Secondary | ICD-10-CM

## 2020-10-14 DIAGNOSIS — M47816 Spondylosis without myelopathy or radiculopathy, lumbar region: Secondary | ICD-10-CM | POA: Diagnosis not present

## 2020-10-14 HISTORY — DX: Unspecified atrial fibrillation: I48.91

## 2020-10-14 HISTORY — DX: Essential (primary) hypertension: I10

## 2020-10-14 HISTORY — DX: Heart failure, unspecified: I50.9

## 2020-10-14 LAB — I-STAT CHEM 8, ED
BUN: 15 mg/dL (ref 8–23)
Calcium, Ion: 1.16 mmol/L (ref 1.15–1.40)
Chloride: 106 mmol/L (ref 98–111)
Creatinine, Ser: 1.1 mg/dL (ref 0.61–1.24)
Glucose, Bld: 115 mg/dL — ABNORMAL HIGH (ref 70–99)
HCT: 39 % (ref 39.0–52.0)
Hemoglobin: 13.3 g/dL (ref 13.0–17.0)
Potassium: 4 mmol/L (ref 3.5–5.1)
Sodium: 144 mmol/L (ref 135–145)
TCO2: 30 mmol/L (ref 22–32)

## 2020-10-14 LAB — COMPREHENSIVE METABOLIC PANEL
ALT: 11 U/L (ref 0–44)
AST: 21 U/L (ref 15–41)
Albumin: 3.3 g/dL — ABNORMAL LOW (ref 3.5–5.0)
Alkaline Phosphatase: 65 U/L (ref 38–126)
Anion gap: 7 (ref 5–15)
BUN: 14 mg/dL (ref 8–23)
CO2: 28 mmol/L (ref 22–32)
Calcium: 8.9 mg/dL (ref 8.9–10.3)
Chloride: 106 mmol/L (ref 98–111)
Creatinine, Ser: 1.25 mg/dL — ABNORMAL HIGH (ref 0.61–1.24)
GFR, Estimated: 57 mL/min — ABNORMAL LOW (ref >60)
Glucose, Bld: 122 mg/dL — ABNORMAL HIGH (ref 70–99)
Potassium: 4 mmol/L (ref 3.5–5.1)
Sodium: 141 mmol/L (ref 135–145)
Total Bilirubin: 1 mg/dL (ref 0.3–1.2)
Total Protein: 6.3 g/dL — ABNORMAL LOW (ref 6.5–8.1)

## 2020-10-14 LAB — PROTIME-INR
INR: 1.3 — ABNORMAL HIGH (ref 0.8–1.2)
Prothrombin Time: 15.8 seconds — ABNORMAL HIGH (ref 11.4–15.2)

## 2020-10-14 LAB — SAMPLE TO BLOOD BANK

## 2020-10-14 LAB — LACTIC ACID, PLASMA: Lactic Acid, Venous: 1.5 mmol/L (ref 0.5–1.9)

## 2020-10-14 LAB — CBC
HCT: 38.3 % — ABNORMAL LOW (ref 39.0–52.0)
Hemoglobin: 12.5 g/dL — ABNORMAL LOW (ref 13.0–17.0)
MCH: 30.1 pg (ref 26.0–34.0)
MCHC: 32.6 g/dL (ref 30.0–36.0)
MCV: 92.3 fL (ref 80.0–100.0)
Platelets: 145 10*3/uL — ABNORMAL LOW (ref 150–400)
RBC: 4.15 MIL/uL — ABNORMAL LOW (ref 4.22–5.81)
RDW: 14.5 % (ref 11.5–15.5)
WBC: 6.9 10*3/uL (ref 4.0–10.5)
nRBC: 0 % (ref 0.0–0.2)

## 2020-10-14 LAB — ETHANOL: Alcohol, Ethyl (B): <10 mg/dL (ref <10)

## 2020-10-14 MED ORDER — FENTANYL CITRATE (PF) 100 MCG/2ML IJ SOLN
INTRAMUSCULAR | Status: AC
  Filled 2020-10-14: qty 2

## 2020-10-14 MED ORDER — FENTANYL CITRATE (PF) 100 MCG/2ML IJ SOLN
50.0000 ug | Freq: Once | INTRAMUSCULAR | Status: AC
Start: 2020-10-14 — End: 2020-10-14
  Administered 2020-10-14: 50 ug via INTRAVENOUS

## 2020-10-14 MED ORDER — METHOCARBAMOL 500 MG PO TABS
1000.0000 mg | ORAL_TABLET | Freq: Three times a day (TID) | ORAL | Status: DC
  Administered 2020-10-15 – 2020-10-16 (×6): 1000 mg via ORAL
  Filled 2020-10-14 (×6): qty 2

## 2020-10-14 MED ORDER — IBUPROFEN 200 MG PO TABS
400.0000 mg | ORAL_TABLET | Freq: Four times a day (QID) | ORAL | Status: DC
  Administered 2020-10-15 (×2): 400 mg via ORAL
  Filled 2020-10-14: qty 1
  Filled 2020-10-14: qty 2

## 2020-10-14 MED ORDER — OXYCODONE HCL 5 MG/5ML PO SOLN
2.5000 mg | ORAL | Status: DC | PRN
  Administered 2020-10-15: 5 mg via ORAL
  Filled 2020-10-14: qty 5

## 2020-10-14 MED ORDER — ONDANSETRON 4 MG PO TBDP: 4.0000 mg | ORAL_TABLET | Freq: Four times a day (QID) | ORAL | Status: DC | PRN

## 2020-10-14 MED ORDER — ACETAMINOPHEN 325 MG PO TABS: 650.0000 mg | ORAL_TABLET | ORAL | Status: DC | PRN

## 2020-10-14 MED ORDER — FENTANYL CITRATE (PF) 100 MCG/2ML IJ SOLN
25.0000 ug | Freq: Once | INTRAMUSCULAR | Status: AC
Start: 2020-10-14 — End: 2020-10-14
  Administered 2020-10-14: 25 ug via INTRAVENOUS
  Filled 2020-10-14: qty 2

## 2020-10-14 MED ORDER — IOHEXOL 300 MG/ML  SOLN
100.0000 mL | Freq: Once | INTRAMUSCULAR | Status: AC | PRN
  Administered 2020-10-14: 100 mL via INTRAVENOUS

## 2020-10-14 MED ORDER — ACETAMINOPHEN 500 MG PO TABS
1000.0000 mg | ORAL_TABLET | Freq: Four times a day (QID) | ORAL | Status: DC
  Administered 2020-10-15 – 2020-10-19 (×19): 1000 mg via ORAL
  Filled 2020-10-14 (×18): qty 2

## 2020-10-14 MED ORDER — DOCUSATE SODIUM 100 MG PO CAPS
100.0000 mg | ORAL_CAPSULE | Freq: Two times a day (BID) | ORAL | Status: DC
  Administered 2020-10-15 – 2020-10-19 (×9): 100 mg via ORAL
  Filled 2020-10-14 (×9): qty 1

## 2020-10-14 MED ORDER — LIDOCAINE 5 % EX PTCH
1.0000 | MEDICATED_PATCH | CUTANEOUS | Status: DC
  Administered 2020-10-15 – 2020-10-19 (×5): 1 via TRANSDERMAL
  Filled 2020-10-14 (×6): qty 1

## 2020-10-14 MED ORDER — ONDANSETRON HCL 4 MG/2ML IJ SOLN
4.0000 mg | Freq: Once | INTRAMUSCULAR | Status: AC
  Administered 2020-10-14: 4 mg via INTRAVENOUS
  Filled 2020-10-14: qty 2

## 2020-10-14 MED ORDER — ENOXAPARIN SODIUM 30 MG/0.3ML IJ SOSY
30.0000 mg | PREFILLED_SYRINGE | Freq: Two times a day (BID) | INTRAMUSCULAR | Status: DC
  Administered 2020-10-15 (×2): 30 mg via SUBCUTANEOUS
  Filled 2020-10-14 (×2): qty 0.3

## 2020-10-14 MED ORDER — HYDROMORPHONE HCL 1 MG/ML IJ SOLN
1.0000 mg | Freq: Once | INTRAMUSCULAR | Status: AC
  Administered 2020-10-14: 1 mg via INTRAVENOUS
  Filled 2020-10-14: qty 1

## 2020-10-14 MED ORDER — MORPHINE SULFATE (PF) 2 MG/ML IV SOLN
1.0000 mg | INTRAVENOUS | Status: DC | PRN
  Administered 2020-10-15 – 2020-10-16 (×3): 2 mg via INTRAVENOUS
  Filled 2020-10-14 (×3): qty 1

## 2020-10-14 MED ORDER — ONDANSETRON HCL 4 MG/2ML IJ SOLN
4.0000 mg | Freq: Four times a day (QID) | INTRAMUSCULAR | Status: DC | PRN
  Administered 2020-10-16 (×2): 4 mg via INTRAVENOUS
  Filled 2020-10-14 (×3): qty 2

## 2020-10-14 NOTE — Progress Notes (Signed)
   10/14/20 2100  Clinical Encounter Type  Visited With Patient not available  Visit Type Trauma  Referral From Nurse  Consult/Referral To Chaplain  The chaplain responded to Trauma 2 page. The patient is being assessed. No family is present. No chaplain services are needed at this time. The chaplain will follow up as needed.

## 2020-10-14 NOTE — H&P (Signed)
Reason for Consult/Chief Complaint: fall down stairs Consultant: Rogene Houston, MD  [name] [name] is an 80 y.o. male.   HPI: 68M s/p mechanical fall down four stairs. He reports carrying a case of water and miscalculating the number of steps. He reports landing on his left side and elbows.   Past Medical History:  Diagnosis Date   A-fib [facility])    CHF (congestive heart failure) ([place])    Hypertension     No family history on file.  Social History:  reports that he has quit smoking. His smoking use included cigarettes. He does not have any smokeless tobacco history on file. No history on file for alcohol use and drug use.  Allergies:  Allergies  Allergen Reactions   Aminophylline    Atenolol    Carvedilol    Lisinopril    Zolpidem     Medications: I have reviewed the patient's current medications.  Results for orders placed or performed during the hospital encounter of 10/14/20 (from the past 48 hour(s))  Ethanol     Status: None   Collection Time: 10/14/20  9:13 PM  Result Value Ref Range   Alcohol, Ethyl (B) <10 <10 mg/dL    Comment: (NOTE) Lowest detectable limit for serum alcohol is 10 mg/dL.  For medical purposes only. Performed at [facility], [place] [address] [place], [place] [address]   Lactic acid, plasma     Status: None   Collection Time: 10/14/20  9:13 PM  Result Value Ref Range   Lactic Acid, Venous 1.5 0.5 - 1.9 mmol/L    Comment: Performed at [place] [address], [place], [place] [address]  Comprehensive metabolic panel     Status: Abnormal   Collection Time: 10/14/20  9:21 PM  Result Value Ref Range   Sodium 141 135 - 145 mmol/L   Potassium 4.0 3.5 - 5.1 mmol/L   Chloride 106 98 - 111 mmol/L   CO2 28 22 - 32 mmol/L   Glucose, Bld 122 (H) 70 - 99 mg/dL    Comment: Glucose reference range applies only to samples taken after fasting for at least 8 hours.   BUN 14 8 - 23 mg/dL   Creatinine, Ser 1.25 (H) 0.61 - 1.24 mg/dL    Calcium 8.9 8.9 - 10.3 mg/dL   Total Protein 6.3 (L) 6.5 - 8.1 g/dL   Albumin 3.3 (L) 3.5 - 5.0 g/dL   AST 21 15 - 41 U/L   ALT 11 0 - 44 U/L   Alkaline Phosphatase 65 38 - 126 U/L   Total Bilirubin 1.0 0.3 - 1.2 mg/dL   GFR, Estimated 57 (L) >60 mL/min    Comment: (NOTE) Calculated using the CKD-EPI Creatinine Equation (2021)    Anion gap 7 5 - 15    Comment: Performed at [facility], [place] [address], [place], [place] [address]  CBC     Status: Abnormal   Collection Time: 10/14/20  9:21 PM  Result Value Ref Range   WBC 6.9 4.0 - 10.5 K/uL   RBC 4.15 (L) 4.22 - 5.81 MIL/uL   Hemoglobin 12.5 (L) 13.0 - 17.0 g/dL   HCT 38.3 (L) 39.0 - 52.0 %   MCV 92.3 80.0 - 100.0 fL   MCH 30.1 26.0 - 34.0 pg   MCHC 32.6 30.0 - 36.0 g/dL   RDW 14.5 11.5 - 15.5 %   Platelets 145 (L) 150 - 400 K/uL   nRBC 0.0  0.0 - 0.2 %    Comment: Performed at [facility], [place] [address], [place], [place] [address]  Protime-INR     Status: Abnormal   Collection Time: 10/14/20  9:21 PM  Result Value Ref Range   Prothrombin Time 15.8 (H) 11.4 - 15.2 seconds   INR 1.3 (H) 0.8 - 1.2    Comment: (NOTE) INR goal varies based on device and disease states. Performed at [facility], [place] [address] [place], [place] [address]   Sample to Blood Bank     Status: None   Collection Time: 10/14/20  9:21 PM  Result Value Ref Range   Blood Bank Specimen SAMPLE AVAILABLE FOR TESTING    Sample Expiration      10/15/2020,2359 Performed at [facility], [place] [address], [place], [place] [address]   I-Stat Chem 8, ED     Status: Abnormal   Collection Time: 10/14/20 10:21 PM  Result Value Ref Range   Sodium 144 135 - 145 mmol/L   Potassium 4.0 3.5 - 5.1 mmol/L   Chloride 106 98 - 111 mmol/L   BUN 15 8 - 23 mg/dL   Creatinine, Ser 1.10 0.61 - 1.24 mg/dL   Glucose, Bld 115 (H) 70 - 99 mg/dL    Comment: Glucose reference range applies only to samples taken after fasting for at least 8 hours.    Calcium, Ion 1.16 1.15 - 1.40 mmol/L   TCO2 30 22 - 32 mmol/L   Hemoglobin 13.3 13.0 - 17.0 g/dL   HCT 39.0 39.0 - 52.0 %    CT HEAD WO CONTRAST  Result Date: 10/14/2020 CLINICAL DATA:  Recent fall with headaches and neck pain, initial encounter EXAM: CT HEAD WITHOUT CONTRAST CT CERVICAL SPINE WITHOUT CONTRAST TECHNIQUE: Multidetector CT imaging of the head and cervical spine was performed following the standard protocol without intravenous contrast. Multiplanar CT image reconstructions of the cervical spine were also generated. COMPARISON:  None. FINDINGS: CT HEAD FINDINGS Brain: No evidence of acute infarction, hemorrhage, hydrocephalus, extra-axial collection or mass lesion/mass effect. Chronic atrophic changes are noted. Mild chronic white matter ischemic changes are noted as well. Vascular: No hyperdense vessel or unexpected calcification. Skull: Normal. Negative for fracture or focal lesion. Sinuses/Orbits: Mucosal retention cyst is noted within the left maxillary antrum. Other: Mild scalp hematoma is noted in the left posterior parietooccipital region. CT CERVICAL SPINE FINDINGS Alignment: Within normal limits. Skull base and vertebrae: 7 cervical segments are well visualized. Vertebral body height is well maintained. Multilevel disc space narrowing is noted from C3 to C7 with associated osteophytic changes. Multilevel facet hypertrophic changes are noted. No findings to suggest acute fracture or acute facet abnormality are seen. Multilevel neural foraminal narrowing is noted. Soft tissues and spinal canal: Surrounding soft tissue structures are within normal limits. Scattered vascular calcifications are seen Upper chest: Visualized lung apices are within normal limits. Other: None IMPRESSION: CT of the head: Chronic atrophic and ischemic changes without acute intracranial abnormality. Mild left posterior scalp hematoma. CT of the cervical spine: Multilevel degenerative change without acute  abnormality. Electronically Signed   By: Inez Catalina M.D.   On: 10/14/2020 23:16   CT CERVICAL SPINE WO CONTRAST  Result Date: 10/14/2020 CLINICAL DATA:  Recent fall with headaches and neck pain, initial encounter EXAM: CT HEAD WITHOUT CONTRAST CT CERVICAL SPINE WITHOUT CONTRAST TECHNIQUE: Multidetector CT imaging of the head and cervical spine was performed following the standard protocol without intravenous contrast. Multiplanar CT  image reconstructions of the cervical spine were also generated. COMPARISON:  None. FINDINGS: CT HEAD FINDINGS Brain: No evidence of acute infarction, hemorrhage, hydrocephalus, extra-axial collection or mass lesion/mass effect. Chronic atrophic changes are noted. Mild chronic white matter ischemic changes are noted as well. Vascular: No hyperdense vessel or unexpected calcification. Skull: Normal. Negative for fracture or focal lesion. Sinuses/Orbits: Mucosal retention cyst is noted within the left maxillary antrum. Other: Mild scalp hematoma is noted in the left posterior parietooccipital region. CT CERVICAL SPINE FINDINGS Alignment: Within normal limits. Skull base and vertebrae: 7 cervical segments are well visualized. Vertebral body height is well maintained. Multilevel disc space narrowing is noted from C3 to C7 with associated osteophytic changes. Multilevel facet hypertrophic changes are noted. No findings to suggest acute fracture or acute facet abnormality are seen. Multilevel neural foraminal narrowing is noted. Soft tissues and spinal canal: Surrounding soft tissue structures are within normal limits. Scattered vascular calcifications are seen Upper chest: Visualized lung apices are within normal limits. Other: None IMPRESSION: CT of the head: Chronic atrophic and ischemic changes without acute intracranial abnormality. Mild left posterior scalp hematoma. CT of the cervical spine: Multilevel degenerative change without acute abnormality. Electronically Signed   By: Inez Catalina M.D.   On: 10/14/2020 23:16   CT CHEST ABDOMEN PELVIS W CONTRAST  Result Date: 10/14/2020 CLINICAL DATA:  Trauma EXAM: CT CHEST, ABDOMEN, AND PELVIS WITH CONTRAST TECHNIQUE: Multidetector CT imaging of the chest, abdomen and pelvis was performed following the standard protocol during bolus administration of intravenous contrast. CONTRAST:  164mL OMNIPAQUE IOHEXOL 300 MG/ML  SOLN COMPARISON:  Chest x-ray 10/14/2020, CT 10/28/2017, CT chest 04/11/2020 FINDINGS: CT CHEST FINDINGS Cardiovascular: Moderate aortic atherosclerosis. No aneurysm. Normal aortic contour. Borderline cardiomegaly. No pericardial effusion. Coronary vascular calcification Mediastinum/Nodes: Midline trachea. No suspicious thyroid mass. No enlarged lymph nodes. Esophagus within normal limits. Lungs/Pleura: No pleural effusion or pneumothorax. Bandlike density in the right upper lobe is stable and consistent with post treatment changes. Left apical ground-glass nodule not well demonstrated today. Minimal ground-glass density in the posterior left upper lobe and left lower lobe favored to represent atelectasis. Musculoskeletal: Sternum is intact. Thoracic alignment appears normal. Acute third anterolateral, fourth posterior, fifth posterolateral, sixth posterior and lateral, seventh posterior, and eighth posterior rib fractures. CT ABDOMEN PELVIS FINDINGS Hepatobiliary: Stable hypodensity in the left hepatic lobe. No calcified gallstone or biliary dilatation. Pancreas: Unremarkable. No pancreatic ductal dilatation or surrounding inflammatory changes. Spleen: Normal in size without focal abnormality. Adrenals/Urinary Tract: Adrenal glands are normal. Kidneys show no hydronephrosis. Bilateral renal cysts. The bladder is unremarkable Stomach/Bowel: The stomach is nonenlarged. No dilated small bowel. No acute bowel wall thickening. Diverticular disease of the left colon. Vascular/Lymphatic: Moderate aortic atherosclerosis. No aneurysm. No  suspicious nodes Reproductive: Status post prostatectomy Other: Negative for pelvic effusion or free air. Small fat containing umbilical hernia. Musculoskeletal: No evidence for a pelvic fracture. Lumbar spine findings are dictated separately. IMPRESSION: 1. Multiple acute left-sided rib fractures including segmental fracture of the left sixth rib. No visible pneumothorax. Minimal ground-glass density within the posterior left upper lobe and subpleural left lung base favored to represent atelectasis though contusion could be considered given multiple left rib fractures. 2. No CT evidence for acute mediastinal injury. No CT evidence for acute solid organ injury within the abdomen or pelvis. 3. Diverticular disease of left colon without acute wall thickening 4. Post treatment changes in the right upper lobe Electronically Signed   By: Madie Reno.D.  On: 10/14/2020 23:22   CT L-SPINE NO CHARGE  Result Date: 10/14/2020 CLINICAL DATA:  Poly trauma spine injury suspected EXAM: CT LUMBAR SPINE WITHOUT CONTRAST TECHNIQUE: Multidetector CT imaging of the lumbar spine was performed without intravenous contrast administration. Multiplanar CT image reconstructions were generated from previously performed CT chest abdomen pelvis. COMPARISON:  Radiograph 09/06/2018, MRI 10/24/2014 FINDINGS: Segmentation: Transitional anatomy. Assuming 12 rib pairs and counting from above, transitional segment is designated sacralized L5. This is different from previous MRI numbering schemes. Alignment: Normal. Vertebrae: Vertebral assessment limited by artifact, resulting in indistinct appearance of the vertebral bodies. Vertebral body heights are maintained. Irregularity and potential fracture lucency involving the posterior vertebral body at L2 and the inferior endplate, series 1, image 56. No other discrete fracture is seen. Paraspinal and other soft tissues: No paravertebral or paraspinal soft tissue abnormality is seen. Disc levels:  At T12-L1, mild vacuum disc. No canal stenosis. Foramen are patent. At L1-L2, maintained disc space, no high-grade canal stenosis or foraminal narrowing. At L2-L3, mild disc space narrowing. Artifact versus retropulsed fracture fragment, series 3, image 45. At L3-L4, mild diffuse disc bulge. Ligamentum flavum thickening and facet degenerative change. Mild foraminal narrowing. At L4-L5, disc space narrowing with vacuum disc. Hypertrophic facet degenerative change. No canal stenosis. Moderate bilateral foraminal narrowing. At L5-S1, rudimentary disc space. IMPRESSION: 1. The examination is degraded by artifact which limits evaluation of the vertebra for facture. Transitional anatomy as described above. Potential fracture versus artifact involving posterior L2 and inferior endplate. Recommend repeat conventional lumbar spine CT without contrast for further evaluation. 2. Multilevel degenerative changes. Electronically Signed   By: Donavan Foil M.D.   On: 10/14/2020 23:38   DG Chest Portable 1 View  Result Date: 10/14/2020 CLINICAL DATA:  Recent fall with chest pain, initial encounter EXAM: PORTABLE CHEST 1 VIEW COMPARISON:  08/15/2020 FINDINGS: Cardiac shadow is stable. The lungs are well aerated bilaterally. Mild left basilar atelectasis is seen. Mildly displaced fractures involving the left fourth rib posteriorly and sixth rib laterally are seen. No pneumothorax is noted. IMPRESSION: Left rib fractures with left basilar atelectasis. No pneumothorax is noted. No other focal abnormality is seen. Electronically Signed   By: Inez Catalina M.D.   On: 10/14/2020 21:25    ROS 10 point review of systems is negative except as listed above in HPI.   Physical Exam Blood pressure (!) 123/58, pulse 92, temperature 98.6 F (37 C), temperature source Oral, resp. rate 14, height 5\' 7"  (1.702 m), weight 112 kg, SpO2 95 %. Constitutional: well-developed, well-nourished HEENT: pupils equal, round, reactive to light, 87mm  b/l, moist conjunctiva, external inspection of ears and nose normal, hearing intact Oropharynx: normal oropharyngeal mucosa, dentures Neck: no thyromegaly, trachea midline, no midline cervical tenderness to palpation Chest: breath sounds equal bilaterally, normal respiratory effort, + L lateral chest wall tenderness to palpation Abdomen: soft, NT, no bruising, no hepatosplenomegaly GU: no blood at urethral meatus of penis, no scrotal masses or abnormality  Back: no wounds, unable to assess thoracic/lumbar spine tenderness to palpation Rectal: deferred Extremities: 2+ radial and pedal pulses bilaterally, motor and sensation intact to bilateral UE and LE, no peripheral edema MSK: unable to assess gait/station, no clubbing/cyanosis of fingers/toes, normal ROM of all four extremities Skin: warm, dry, no rashes Psych: normal memory, normal mood/affect    Assessment/Plan: 36M s/p mechanical fall  L rib fx 3-8, segemental 6th - pain control, IS/pulm toilet, wean O2 as tolerated, PT/OT Cardiac hx, incl AF - resume home  meds FEN - reg diet DVT - SCDs, LMWH for now, recommend f/u as o/p with cardiology (Dr. Daneen Schick) to discuss whether or not to resume Eliquis  Dispo - admit to inpatient, progressive   Jesusita Oka, MD General and [facility]

## 2020-10-14 NOTE — ED Triage Notes (Signed)
Pt from home after fall. Pt was carrying a case of water, misstepped, and fell. EMS reports ~3-4in lac to back of head, abrasions to bilateral elbows. Pt also c/o L shoulder pain with limited ROM Pt is on Eliquis for Afib. Denies LOC

## 2020-10-14 NOTE — ED Notes (Signed)
Replaced PT's C-Collar from EMS with [place] J collar

## 2020-10-14 NOTE — ED Provider Notes (Addendum)
[facility] EMERGENCY DEPARTMENT Provider Note   CSN: [id number] Arrival date & time: 10/14/20  2105     History Chief Complaint  Patient presents with   [name] [name] is a 80 y.o. male.  Patient brought in as a level 2 trauma.  Patient was carrying a case of water down some stairs and he slipped and fell down the last 3 stairs.  Said he landed on his buttocks and then thinks the case sort of crushed his left chest area.  He is hit the back of his head on the left side.  States tetanus is up-to-date.  No loss of consciousness.  Complaining of pain to the lumbar back and originally pain to the left shoulder area.  Patient is on the blood thinner Eliquis for atrial fibrillation.  Patient does have a c-collar on.  Patient's blood pressure upon arrival was 138/84.  His oxygen saturations on room air were 97% heart rate was 80 and temp was 98.6      Past Medical History:  Diagnosis Date   A-fib ([place])    CHF (congestive heart failure) ([place])    Hypertension     There are no problems to display for this patient.       No family history on file.  Social History   Tobacco Use   Smoking status: Former    Types: Cigarettes    Home Medications Prior to Admission medications   Not on File    Allergies    Aminophylline, Atenolol, Carvedilol, Lisinopril, and Zolpidem  Review of Systems   Review of Systems  Constitutional:  Negative for chills and fever.  HENT:  Negative for ear pain and sore throat.   Eyes:  Negative for pain and visual disturbance.  Respiratory:  Negative for cough and shortness of breath.   Cardiovascular:  Positive for chest pain. Negative for palpitations.  Gastrointestinal:  Negative for abdominal pain and vomiting.  Genitourinary:  Negative for dysuria and hematuria.  Musculoskeletal:  Positive for back pain. Negative for arthralgias.  Skin:  Negative for color change and rash.  Neurological:  Negative for seizures and  syncope.  Hematological:  Bruises/bleeds easily.  All other systems reviewed and are negative.  Physical Exam Updated Vital Signs BP (!) 161/67   Pulse 85   Temp 98.6 F (37 C) (Oral)   Resp 16   Ht 1.702 m (5\' 7" )   Wt 112 kg   SpO2 (!) 89%   BMI 38.69 kg/m   Physical Exam Vitals and nursing note reviewed.  Constitutional:      Appearance: Normal appearance. He is well-developed.  HENT:     Head: Normocephalic.     Comments: Left occiput area within a scalp abrasion as well as a stellate laceration with 2-3 arms measuring about 1 cm each.  No significant bleeding currently.  No step-off. Eyes:     Extraocular Movements: Extraocular movements intact.     Conjunctiva/sclera: Conjunctivae normal.     Pupils: Pupils are equal, round, and reactive to light.  Neck:     Comments: Cervical collar in place Cardiovascular:     Rate and Rhythm: Normal rate and regular rhythm.     Heart sounds: No murmur heard. Pulmonary:     Effort: Pulmonary effort is normal. No respiratory distress.     Breath sounds: Normal breath sounds.     Comments: Tenderness to left lateral rib cage area. Chest:     Chest  wall: Tenderness present.  Abdominal:     Palpations: Abdomen is soft.     Tenderness: There is no abdominal tenderness.     Comments: No abdominal tenderness to the left upper quadrant right upper quadrant.  No guarding.  No lower quadrant tenderness.  Musculoskeletal:        General: Normal range of motion.     Comments: Good range of motion of both upper extremities been particular no tenderness on the left upper extremity at the wrist fingers elbow or shoulder.  Good cap refill distally good movement sensation intact.  Good movement of both lower extremities.  No pain at the hip.  No deformities.  Good movement and sensation of the feet bilaterally.  Skin:    General: Skin is warm and dry.     Capillary Refill: Capillary refill takes less than 2 seconds.  Neurological:      General: No focal deficit present.     Mental Status: He is alert and oriented to person, place, and time.     Cranial Nerves: No cranial nerve deficit.     Sensory: No sensory deficit.     Motor: No weakness.    ED Results / Procedures / Treatments   Labs (all labs ordered are listed, but only abnormal results are displayed) Labs Reviewed  COMPREHENSIVE METABOLIC PANEL - Abnormal; Notable for the following components:      Result Value   Glucose, Bld 122 (*)    Creatinine, Ser 1.25 (*)    Total Protein 6.3 (*)    Albumin 3.3 (*)    GFR, Estimated 57 (*)    All other components within normal limits  CBC - Abnormal; Notable for the following components:   RBC 4.15 (*)    Hemoglobin 12.5 (*)    HCT 38.3 (*)    Platelets 145 (*)    All other components within normal limits  PROTIME-INR - Abnormal; Notable for the following components:   Prothrombin Time 15.8 (*)    INR 1.3 (*)    All other components within normal limits  I-STAT CHEM 8, ED - Abnormal; Notable for the following components:   Glucose, Bld 115 (*)    All other components within normal limits  ETHANOL  LACTIC ACID, PLASMA  URINALYSIS, ROUTINE W REFLEX MICROSCOPIC  SAMPLE TO BLOOD BANK    EKG EKG Interpretation  Date/Time:  Sunday October 14 2020 21:18:48 EDT Ventricular Rate:  74 PR Interval:  175 QRS Duration: 120 QT Interval:  390 QTC Calculation: 433 R Axis:   -20 Text Interpretation: Sinus rhythm Atrial premature complexes IVCD, consider atypical RBBB No previous ECGs available Confirmed by Fredia Sorrow [phone]) on 10/14/2020 9:42:17 PM  Radiology DG Chest Portable 1 View  Result Date: 10/14/2020 CLINICAL DATA:  Recent fall with chest pain, initial encounter EXAM: PORTABLE CHEST 1 VIEW COMPARISON:  08/15/2020 FINDINGS: Cardiac shadow is stable. The lungs are well aerated bilaterally. Mild left basilar atelectasis is seen. Mildly displaced fractures involving the left fourth rib posteriorly and sixth  rib laterally are seen. No pneumothorax is noted. IMPRESSION: Left rib fractures with left basilar atelectasis. No pneumothorax is noted. No other focal abnormality is seen. Electronically Signed   By: Inez Catalina M.D.   On: 10/14/2020 21:25    Procedures Procedures   Medications Ordered in ED Medications  HYDROmorphone (DILAUDID) injection 1 mg (has no administration in time range)  ondansetron (ZOFRAN) injection 4 mg (4 mg Intravenous Given 10/14/20 2153)  fentaNYL (SUBLIMAZE)  injection 25 mcg (25 mcg Intravenous Given 10/14/20 2153)  fentaNYL (SUBLIMAZE) injection 50 mcg (50 mcg Intravenous Given 10/14/20 2228)  iohexol (OMNIPAQUE) 300 MG/ML solution 100 mL (100 mLs Intravenous Contrast Given 10/14/20 2245)    ED Course  I have reviewed the triage vital signs and the nursing notes.  Pertinent labs & imaging results that were available during my care of the patient were reviewed by me and considered in my medical decision making (see chart for details).  Clinical Course as of 10/14/20 2334  Sun Oct 14, 2020  [address] down approx 3 steps on to case of water, 2 rib fx on CXR, (+) head strike on blood thinners, with stellate lac, pendig CTH, Ct C spine, Ct chest, no LOC, home with pain control and incentive spirometer [MK]    Clinical Course User Index [MK] Kommor, [place], MD   MDM Rules/Calculators/A&P                           CRITICAL CARE Performed by: Fredia Sorrow Total critical care time: 40 minutes Critical care time was exclusive of separately billable procedures and treating other patients. Critical care was necessary to treat or prevent imminent or life-threatening deterioration. Critical care was time spent personally by me on the following activities: development of treatment plan with patient and/or surrogate as well as nursing, discussions with consultants, evaluation of patient's response to treatment, examination of patient, obtaining history from patient or surrogate,  ordering and performing treatments and interventions, ordering and review of laboratory studies, ordering and review of radiographic studies, pulse oximetry and re-evaluation of patient's condition.   Patient portable chest x-ray shows evidence of 2 rib fractures.  No pneumothorax.  The chest x-ray shows left rib fractures with left basilar atelectasis no pneumothorax as stated.  There is evidence of fracture to the left fourth rib posteriorly and the sixth rib laterally.  Patient has had CT CT neck CT chest with contrast CT abdomen and pelvis with contrast.  The abdomen is mostly done because of the lumbar back pain.  The CT chest is done due to the term traumatic rib fractures.  Patient does have a stellate laceration to the left occiput area.  Clinically do not feel that this needs any staples.  No active bleeding.  Review of head CT and CT neck would be important.  Patient turned over to the overnight emergency physician who will follow-up on the scan results.   Final Clinical Impression(s) / ED Diagnoses Final diagnoses:  Trauma  Pain    Rx / DC Orders ED Discharge Orders     None        Fredia Sorrow, MD 10/14/20 2315    Fredia Sorrow, MD 10/14/20 [phone]

## 2020-10-15 ENCOUNTER — Encounter (HOSPITAL_COMMUNITY): Payer: Self-pay

## 2020-10-15 ENCOUNTER — Inpatient Hospital Stay (HOSPITAL_COMMUNITY): Payer: Medicare Other

## 2020-10-15 LAB — RESP PANEL BY RT-PCR (FLU A&B, COVID) ARPGX2
Influenza A by PCR: NEGATIVE
Influenza B by PCR: NEGATIVE
SARS Coronavirus 2 by RT PCR: NEGATIVE

## 2020-10-15 MED ORDER — LIDOCAINE 5 % EX PTCH
1.0000 | MEDICATED_PATCH | Freq: Once | CUTANEOUS | Status: AC
  Administered 2020-10-15: 1 via TRANSDERMAL

## 2020-10-15 MED ORDER — MELATONIN 3 MG PO TABS
3.0000 mg | ORAL_TABLET | Freq: Once | ORAL | Status: AC
  Administered 2020-10-15: 3 mg via ORAL
  Filled 2020-10-15: qty 1

## 2020-10-15 MED ORDER — FLECAINIDE ACETATE 50 MG PO TABS
50.0000 mg | ORAL_TABLET | Freq: Two times a day (BID) | ORAL | Status: DC
  Administered 2020-10-15 – 2020-10-19 (×9): 50 mg via ORAL
  Filled 2020-10-15 (×10): qty 1

## 2020-10-15 MED ORDER — PANTOPRAZOLE SODIUM 40 MG PO TBEC
40.0000 mg | DELAYED_RELEASE_TABLET | Freq: Every day | ORAL | Status: DC
  Administered 2020-10-15 – 2020-10-19 (×5): 40 mg via ORAL
  Filled 2020-10-15 (×5): qty 1

## 2020-10-15 MED ORDER — OXYCODONE HCL 5 MG PO TABS
5.0000 mg | ORAL_TABLET | ORAL | Status: DC | PRN
  Administered 2020-10-15 – 2020-10-16 (×3): 5 mg via ORAL
  Filled 2020-10-15 (×3): qty 1

## 2020-10-15 MED ORDER — FUROSEMIDE 40 MG PO TABS
40.0000 mg | ORAL_TABLET | Freq: Once | ORAL | Status: AC
  Administered 2020-10-15: 40 mg via ORAL
  Filled 2020-10-15: qty 1

## 2020-10-15 MED ORDER — POTASSIUM CHLORIDE CRYS ER 20 MEQ PO TBCR
20.0000 meq | EXTENDED_RELEASE_TABLET | Freq: Every day | ORAL | Status: DC
  Administered 2020-10-15 – 2020-10-19 (×5): 20 meq via ORAL
  Filled 2020-10-15 (×5): qty 1

## 2020-10-15 MED ORDER — POLYVINYL ALCOHOL 1.4 % OP SOLN
1.0000 [drp] | OPHTHALMIC | Status: DC | PRN
  Administered 2020-10-15: 1 [drp] via OPHTHALMIC
  Filled 2020-10-15: qty 15

## 2020-10-15 MED ORDER — IRBESARTAN 300 MG PO TABS
300.0000 mg | ORAL_TABLET | Freq: Every day | ORAL | Status: DC
  Administered 2020-10-15 – 2020-10-19 (×5): 300 mg via ORAL
  Filled 2020-10-15 (×5): qty 1

## 2020-10-15 MED ORDER — POLYETHYLENE GLYCOL 3350 17 G PO PACK
17.0000 g | PACK | Freq: Every day | ORAL | Status: DC
  Administered 2020-10-15 – 2020-10-16 (×2): 17 g via ORAL
  Filled 2020-10-15 (×2): qty 1

## 2020-10-15 MED ORDER — ALBUTEROL SULFATE (2.5 MG/3ML) 0.083% IN NEBU
3.0000 mL | INHALATION_SOLUTION | Freq: Four times a day (QID) | RESPIRATORY_TRACT | Status: DC | PRN
  Administered 2020-10-16: 3 mL via RESPIRATORY_TRACT
  Filled 2020-10-15: qty 3

## 2020-10-15 MED ORDER — MONTELUKAST SODIUM 10 MG PO TABS
10.0000 mg | ORAL_TABLET | Freq: Every day | ORAL | Status: DC
  Administered 2020-10-15 – 2020-10-19 (×5): 10 mg via ORAL
  Filled 2020-10-15 (×5): qty 1

## 2020-10-15 MED ORDER — TIOTROPIUM BROMIDE MONOHYDRATE 18 MCG IN CAPS: 1.0000 | ORAL_CAPSULE | Freq: Every day | RESPIRATORY_TRACT | Status: DC

## 2020-10-15 MED ORDER — FUROSEMIDE 20 MG PO TABS
20.0000 mg | ORAL_TABLET | Freq: Every day | ORAL | Status: DC
  Administered 2020-10-16 – 2020-10-19 (×4): 20 mg via ORAL
  Filled 2020-10-15 (×5): qty 1

## 2020-10-15 MED ORDER — UMECLIDINIUM BROMIDE 62.5 MCG/INH IN AEPB
1.0000 | INHALATION_SPRAY | Freq: Every day | RESPIRATORY_TRACT | Status: DC
  Administered 2020-10-15 – 2020-10-18 (×3): 1 via RESPIRATORY_TRACT
  Filled 2020-10-15: qty 7

## 2020-10-15 MED ORDER — IBUPROFEN 200 MG PO TABS
400.0000 mg | ORAL_TABLET | Freq: Four times a day (QID) | ORAL | Status: DC | PRN
  Administered 2020-10-15: 400 mg via ORAL
  Filled 2020-10-15: qty 2

## 2020-10-15 NOTE — Progress Notes (Signed)
Progress Note: General Surgery Service   Chief Complaint/Subjective: Pain in chest and left lower back.  Some blood on pillow from head laceration  Objective: Vital signs in last 24 hours: Temp:  [98.6 F (37 C)] 98.6 F (37 C) (08/07 2109) Pulse Rate:  [80-95] 89 (08/08 0230) Resp:  [14-28] 18 (08/08 0230) BP: (110-161)/(53-93) 110/56 (08/08 0230) SpO2:  [89 %-97 %] 96 % (08/08 0230) Weight:  [332 kg] 112 kg (08/07 2128)    Intake/Output from previous day: 08/07 0701 - 08/08 0700 In: 240 [P.O.:240] Out: 400 [Urine:400] Intake/Output this shift: No intake/output data recorded.  Constitutional: NAD; conversant; Head: Laceration left posterior scalp with staples, hemostatic Eyes: Moist conjunctiva; no lid lag; anicteric; PERRL Neck: Trachea midline; no thyromegaly Lungs: Normal respiratory effort; no tactile fremitus CV: irregular rhythm; no palpable thrills; peripheral edema GI: Abd Soft, nontender, nondistended; no palpable hepatosplenomegaly MSK: Normal range of motion of extremities; no clubbing/cyanosis Psychiatric: Appropriate affect; alert and oriented x3 Lymphatic: No palpable cervical or axillary lymphadenopathy  Lab Results: CBC  Recent Labs    10/14/20 2121 10/14/20 2221  WBC 6.9  --   HGB 12.5* 13.3  HCT 38.3* 39.0  PLT 145*  --    BMET Recent Labs    10/14/20 2121 10/14/20 2221  NA 141 144  K 4.0 4.0  CL 106 106  CO2 28  --   GLUCOSE 122* 115*  BUN 14 15  CREATININE 1.25* 1.10  CALCIUM 8.9  --    PT/INR Recent Labs    10/14/20 2121  LABPROT 15.8*  INR 1.3*   ABG No results for input(s): PHART, HCO3 in the last 72 hours.  Invalid input(s): PCO2, PO2  Anti-infectives: Anti-infectives (From admission, onward)    None       Medications: Scheduled Meds:  acetaminophen  1,000 mg Oral Q6H   docusate sodium  100 mg Oral BID   enoxaparin (LOVENOX) injection  30 mg Subcutaneous Q12H   flecainide  50 mg Oral BID   [START ON  10/16/2020] furosemide  20 mg Oral Daily   furosemide  40 mg Oral Once   ibuprofen  400 mg Oral QID   irbesartan  300 mg Oral Daily   lidocaine  1 patch Transdermal Q24H   lidocaine  1 patch Transdermal Once   methocarbamol  1,000 mg Oral Q8H   montelukast  10 mg Oral Daily   pantoprazole  40 mg Oral Daily   polyethylene glycol  17 g Oral Daily   potassium chloride SA  20 mEq Oral Daily   tiotropium  1 capsule Inhalation Daily   Continuous Infusions: PRN Meds:.albuterol, morphine injection, ondansetron **OR** ondansetron (ZOFRAN) IV, oxyCODONE, polyvinyl alcohol  Assessment/Plan: [name] [name] is a 80 year old male admitted as a level 2 trauma on 10/15/20, after a fall down 3 stairs.  Left rib fractures 3-8 - Pain control, incentive spirometer Left posterior scalp laceration - stapled in ER Lumbar spine abnormality - recon CT unclear, patient with pain in this area, repeat CT lumbar spine without contrast today  A. Fib, CHF, and HTN - restart home meds, extra lasix today for peripheral edema.  Follow up with cardiologist whether to restart blood thinners with fall risks   LOS: 1 day   FEN: Reg ID: None VTE: SCDs, Lovenox ppx, eliquis held Foley: None Dispo: continued care on floor    Felicie Morn, MD  [facility] Use [email or web address] to contact on call provider

## 2020-10-15 NOTE — ED Notes (Signed)
c-collar removed per MD

## 2020-10-15 NOTE — ED Notes (Signed)
Oxygen sats noted to by 86-89% after Fentanyl given, placed on 2L Oxygen via [place]

## 2020-10-15 NOTE — Evaluation (Signed)
Occupational Therapy Evaluation Patient Details Name: [name] [name] MRN: [id number] DOB: November 09, 1940 Today's Date: 10/15/2020    History of Present Illness 80 y.o M admitted 8/7 with rib 3-8 fxs s/p mechanical fall down 4 stairs. PMHx: HTN, A fib, and CHF   Clinical Impression   This 80 yo male admitted due to above presents to acute OT with PLOF of being totally independent with all basic ADLs with reports that he did get winded at times even before the rib fractures and he has had this addressed with his MDs. He reports his wife can A with LB ADLs until he can do them by himself again and that he has all needed DME at home. He will continue to benefit from acute OT for energy conservation strategies to help with his DOE.    Follow Up Recommendations  No OT follow up;Supervision - Intermittent    Equipment Recommendations  None recommended by OT       Precautions / Restrictions Precautions Precautions: Fall Precaution Comments: monitor O2 sats Restrictions Weight Bearing Restrictions: No      Mobility Bed Mobility    General bed mobility comments: Pt up in recliner upon arrival    Transfers Overall transfer level: Needs assistance Equipment used: None Transfers: Sit to/from Stand Sit to Stand: Min guard      General transfer comment: min guard A to ambulate in room from recliner>door>recliner x2    Balance Overall balance assessment: Needs assistance Sitting-balance support: No upper extremity supported;Feet supported Sitting balance-Leahy Scale: Fair Sitting balance - Comments: Used RUE to maintain upright seated position. Postural lean not present   Standing balance support: No upper extremity supported Standing balance-Leahy Scale: Fair Standing balance comment: Reliant on RW and BUE support                           ADL either performed or assessed with clinical judgement   ADL Overall ADL's : Needs assistance/impaired Eating/Feeding:  Independent;Sitting   Grooming: Set up;Sitting   Upper Body Bathing: Minimal assistance;Sitting   Lower Body Bathing: Maximal assistance Lower Body Bathing Details (indicate cue type and reason): min guard A sit<>stand Upper Body Dressing : Moderate assistance;Sitting   Lower Body Dressing: Total assistance Lower Body Dressing Details (indicate cue type and reason): min A sit<>stand Toilet Transfer: Min guard;Ambulation Toilet Transfer Details (indicate cue type and reason): No AD in room (recliner>door>recliner x2) Toileting- Clothing Manipulation and Hygiene: Moderate assistance Toileting - Clothing Manipulation Details (indicate cue type and reason): min guard A sit<>stand             Vision Patient Visual Report: No change from baseline              Pertinent Vitals/Pain Pain Assessment: 0-10 Pain Score: 10-Worst pain ever Pain Location: back and L ribs--intermittently with certain movements Pain Descriptors / Indicators: Discomfort;Grimacing;Moaning Pain Intervention(s): Limited activity within patient's tolerance;Monitored during session;Repositioned     Hand Dominance Right   Extremity/Trunk Assessment Upper Extremity Assessment Upper Extremity Assessment: Generalized weakness (due to pain in ribs and back with movement)      Communication Communication Communication: No difficulties   Cognition Arousal/Alertness: Awake/alert Behavior During Therapy: WFL for tasks assessed/performed Overall Cognitive Status: Within Functional Limits for tasks assessed  Home Living Family/patient expects to be discharged to:: Private residence Living Arrangements: Spouse/significant other Available Help at Discharge: Family Type of Home: House Home Access: Stairs to enter Technical brewer of Steps: 4 Entrance Stairs-Rails: Can reach both [place]: One level     Bathroom Shower/Tub: Emergency planning/management officer: Handicapped height     Home Equipment: Environmental consultant - 4 wheels;Cane - single point;Shower seat   Additional Comments: Doesn't use ADs; has lift chair at home that he sleeps in      Prior Functioning/Environment Level of Independence: Independent                 OT Problem List: Decreased strength;Decreased range of motion;Impaired balance (sitting and/or standing);Obesity;Pain      OT Treatment/Interventions: Self-care/ADL training;DME and/or AE instruction;Patient/family education;Balance training;Energy conservation    OT Goals(Current goals can be found in the care plan section) Acute Rehab OT Goals Patient Stated Goal: for pain to get better OT Goal Formulation: With patient Time For Goal Achievement: 10/29/20 Potential to Achieve Goals: Good  OT Frequency: Min 2X/week    AM-PAC OT "6 Clicks" Daily Activity     Outcome Measure Help from another person eating meals?: None Help from another person taking care of personal grooming?: A Little Help from another person toileting, which includes using toliet, bedpan, or urinal?: A Little Help from another person bathing (including washing, rinsing, drying)?: A Lot Help from another person to put on and taking off regular upper body clothing?: A Little Help from another person to put on and taking off regular lower body clothing?: A Lot 6 Click Score: 17   End of Session Equipment Utilized During Treatment: Oxygen (RA sats dropped as low as 87% with ambulation in room, up to 94% when 2 liters re-applied and pt educated on purse lipped breathing)  Activity Tolerance: Patient tolerated treatment well Patient left: in chair;with call bell/phone within reach;with chair alarm set  OT Visit Diagnosis: Unsteadiness on feet (R26.81);Muscle weakness (generalized) (M62.81);Pain Pain - part of body:  (ribs and back)                Time: 9833-8250 OT Time Calculation (min): 26 min Charges:  OT General  Charges $OT Visit: 1 Visit OT Evaluation $OT Eval Moderate Complexity: 1 Mod OT Treatments $Self Care/Home Management : 8-22 mins  [address], OTR/L Acute [employer] Pager [phone] Office [phone]    Almon Register 10/15/2020, 2:06 PM

## 2020-10-15 NOTE — Progress Notes (Signed)
OT Cancellation Note  Patient Details Name: [name] [name] MRN: [id number] DOB: [birth year]   Cancelled Treatment:    Reason Eval/Treat Not Completed: Other (comment). Chat texted with MD and will await results of newly ordered CT scan before proceeding with OT eval.  [address], OTR/L Acute Rehab Services Pager [phone] Office [phone]    Almon Register 10/15/2020, 8:46 AM

## 2020-10-15 NOTE — ED Provider Notes (Signed)
  Physical Exam  BP (!) 110/56   Pulse 89   Temp 98.6 F (37 C) (Oral)   Resp 18   Ht 5\' 7"  (1.702 m)   Wt 112 kg   SpO2 96%   BMI 38.69 kg/m   Physical Exam Vitals and nursing note reviewed.  Constitutional:      Appearance: He is well-developed.  HENT:     Head: Normocephalic and atraumatic.  Eyes:     Conjunctiva/sclera: Conjunctivae normal.  Cardiovascular:     Rate and Rhythm: Normal rate and regular rhythm.     Heart sounds: No murmur heard. Pulmonary:     Effort: Pulmonary effort is normal. No respiratory distress.     Breath sounds: Normal breath sounds.  Abdominal:     Palpations: Abdomen is soft.     Tenderness: There is no abdominal tenderness.  Musculoskeletal:        General: Tenderness (L chest wall) present.     Cervical back: Neck supple.  Skin:    General: Skin is warm and dry.     Findings: Lesion (1 cm stellate posterior scalp lac) present.  Neurological:     Mental Status: He is alert.    ED Course/Procedures   Clinical Course as of 10/15/20 0947  Sun Oct 14, 2020  [address] down approx 3 steps on to case of water, 2 rib fx on CXR, (+) head strike on blood thinners, with stellate lac, pendig CTH, Ct C spine, Ct chest, no LOC, home with pain control and incentive spirometer [MK]    Clinical Course User Index [MK] Denetta Fei, Debe Coder, MD    ..Laceration Repair  Date/Time: 10/15/2020 3:39 AM Performed by: Teressa Lower, MD Authorized by: Teressa Lower, MD   Laceration details:    Location:  Scalp   Scalp location:  Occipital   Length (cm):  1 Exploration:    Hemostasis achieved with:  Direct pressure   Wound exploration: entire depth of wound visualized     Contaminated: no   Treatment:    Area cleansed with:  Saline   Amount of cleaning:  Standard   Irrigation solution:  Sterile saline   Irrigation method:  Pressure wash   Visualized foreign bodies/material removed: no   Skin repair:    Repair method:  Staples   Number of staples:   4 Approximation:    Approximation:  Close Repair type:    Repair type:  Simple Post-procedure details:    Dressing:  Open (no dressing)   Procedure completion:  Tolerated well, no immediate complications  MDM  I reevaluated the patient after handoff.  Patient continuing to complain of persistent left-sided chest wall pain.  Occipital laceration repaired with 4 staples.  CT imaging revealing multiple acute left-sided rib fractures involving ribs 3 through 8.  Trauma surgery consulted for admission and the patient was admitted to trauma surgery.      Teressa Lower, MD 10/15/20 [phone]

## 2020-10-15 NOTE — Evaluation (Signed)
Physical Therapy Evaluation Patient Details Name: [name] [name] MRN: [id number] DOB: [birth year] Today's Date: 10/15/2020   History of Present Illness  80 y.o M admitted 8/7 with rib 3-8 fxs s/p mechanical fall down 4 stairs. PMHx: HTN, A fib, and CHF  Clinical Impression  Pt awake/alert during today's session. Upon arrival, pt was on 2L O2. Mentioned feeling nauseous throughout session which did not improve. RN notified during session. Pt desat on RA sitting EOB and placed on 2L O2 (see below). Pt's deficits include decreased bed mobility, balance, and transfers, limited by nausea and pain. Pt was educated on use of incentive spirometer, bed mobility, and transfers. Pt would benefit from PT to improve said deficits and function. Recommend HHPT upon d/c.   Pre-vitals - HR 81, spO2 94% on 2L, BP 112/63(78) Vitals during - spO2 desat to 78% on RA at EOB. Increased O2 back to 2L to maintain sats >90% Post-vitals - HR 69, spO2 90% on 2L, BP 129/58(82)    Follow Up Recommendations Home health PT    Equipment Recommendations  Rolling walker with 5" wheels    Recommendations for Other Services       Precautions / Restrictions Precautions Precautions: Fall Restrictions Weight Bearing Restrictions: No      Mobility  Bed Mobility Overal bed mobility: Needs Assistance Bed Mobility: Supine to Sit     Supine to sit: +2 for physical assistance;Mod assist     General bed mobility comments: Pt in bed upon arrival on 2L of O2. Pt mentioned feeling nauseous prior to mobility and was placed on RA. RN notified. Pt able to move LEs to EOB with mod A +2 physical assist with use of pads and 1 person HHA provided to roll to sidelying and elevate trunk to upright position. Verbal cueing provided for splinting pillow and rolling. Pt desat to 78% on RA at EOB. Placed back on O2 to maintain sats > 90%    Transfers Overall transfer level: Needs assistance Equipment used: Rolling walker (2  wheeled) Transfers: Sit to/from [facility] Sit to Stand: Min assist;+2 physical assistance Stand pivot transfers: Min guard;+2 safety/equipment       General transfer comment: Min A +2 physical assist for sit to stand transfer to power up into standing. Pt performed standing marches for balance purposes without LOB. Min A +2 for safety with stand pivot transfer to recliner. Pt performed with slow, steady pace. Multimodal cues provided for hand placement on bed and RW for sit to/from stand.  Ambulation/Gait             General Gait Details: Unable to assess  Stairs            Wheelchair Mobility    Modified Rankin (Stroke Patients Only)       Balance Overall balance assessment: Needs assistance Sitting-balance support: Single extremity supported Sitting balance-Leahy Scale: Fair Sitting balance - Comments: Used RUE to maintain upright seated position. Postural lean not present   Standing balance support: Bilateral upper extremity supported;During functional activity Standing balance-Leahy Scale: Poor Standing balance comment: Reliant on RW and BUE support                             Pertinent Vitals/Pain Pain Assessment: 0-10 Pain Score: 9  Pain Location: back and L ribs Pain Descriptors / Indicators: Discomfort;Grimacing;Moaning Pain Intervention(s): Limited activity within patient's tolerance;Monitored during session;Repositioned    Home Living Family/patient expects to be  discharged to:: Private residence Living Arrangements: Spouse/significant other Available Help at Discharge: Family Type of Home: House Home Access: Stairs to enter Entrance Stairs-Rails: Can reach both Entrance Stairs-Number of Steps: [place]: One level Home Equipment: Craig - 4 wheels;Cane - single point Additional Comments: Doesn't use ADs    Prior Function Level of Independence: Independent               Hand Dominance   Dominant Hand:  Right    Extremity/Trunk Assessment   Upper Extremity Assessment Upper Extremity Assessment: Defer to OT evaluation    Lower Extremity Assessment Lower Extremity Assessment: Generalized weakness    Cervical / Trunk Assessment Cervical / Trunk Assessment: Normal  Communication   Communication: No difficulties  Cognition Arousal/Alertness: Awake/alert Behavior During Therapy: WFL for tasks assessed/performed Overall Cognitive Status: Within Functional Limits for tasks assessed                                 General Comments: Pt responded to commands accurately      General Comments General comments (skin integrity, edema, etc.): Pt desat to 78% on RA while seated EOB. O2 increased to 2L to maintain sats > 90%    Exercises Other Exercises Other Exercises: Pt used incentive spirometer with 4 trials: 1500-1750   Assessment/Plan    PT Assessment Patient needs continued PT services  PT Problem List Decreased strength;Decreased mobility;Decreased balance;Decreased activity tolerance;Pain       PT Treatment Interventions Gait training;Stair training;Therapeutic activities;Balance training    PT Goals (Current goals can be found in the Care Plan section)  Acute Rehab PT Goals Patient Stated Goal: Return home PT Goal Formulation: With patient Time For Goal Achievement: 10/29/20 Potential to Achieve Goals: Good    Frequency Min 3X/week   Barriers to discharge        Co-evaluation               AM-PAC PT "6 Clicks" Mobility  Outcome Measure Help needed turning from your back to your side while in a flat bed without using bedrails?: A Lot Help needed moving from lying on your back to sitting on the side of a flat bed without using bedrails?: A Lot Help needed moving to and from a bed to a chair (including a wheelchair)?: A Little Help needed standing up from a chair using your arms (e.g., wheelchair or bedside chair)?: A Little Help needed to walk  in hospital room?: A Little Help needed climbing 3-5 steps with a railing? : A Lot 6 Click Score: 15    End of Session Equipment Utilized During Treatment: Gait belt;Oxygen Activity Tolerance: Other (comment);Patient limited by pain (Pt limited by nausea) Patient left: with call bell/phone within reach;in chair;with chair alarm set Nurse Communication: Mobility status;Other (comment) (meds for nausea) PT Visit Diagnosis: Unsteadiness on feet (R26.81);Other abnormalities of gait and mobility (R26.89);Muscle weakness (generalized) (M62.81);History of falling (Z91.81);Pain Pain - Right/Left: Left Pain - part of body:  (ribs 3-8)    Time: 1024-1101 PT Time Calculation (min) (ACUTE ONLY): 37 min   Charges:   PT Evaluation $PT Eval Low Complexity: 1 Low PT Treatments $Therapeutic Activity: 8-22 mins        Louie Casa, SPT Acute Rehab: [phone]    Domingo Dimes 10/15/2020, 11:41 AM

## 2020-10-16 ENCOUNTER — Inpatient Hospital Stay (HOSPITAL_COMMUNITY): Payer: Medicare Other

## 2020-10-16 MED ORDER — KETOROLAC TROMETHAMINE 15 MG/ML IJ SOLN
15.0000 mg | Freq: Once | INTRAMUSCULAR | Status: AC
  Administered 2020-10-16: 15 mg via INTRAVENOUS
  Filled 2020-10-16: qty 1

## 2020-10-16 MED ORDER — CYCLOBENZAPRINE HCL 10 MG PO TABS: 5.0000 mg | ORAL_TABLET | Freq: Three times a day (TID) | ORAL | Status: DC

## 2020-10-16 MED ORDER — METAXALONE 800 MG PO TABS
400.0000 mg | ORAL_TABLET | Freq: Three times a day (TID) | ORAL | Status: DC
  Administered 2020-10-16: 400 mg via ORAL
  Filled 2020-10-16: qty 1
  Filled 2020-10-16: qty 0.5
  Filled 2020-10-16: qty 1
  Filled 2020-10-16: qty 0.5
  Filled 2020-10-16 (×2): qty 1

## 2020-10-16 MED ORDER — ENOXAPARIN SODIUM 40 MG/0.4ML IJ SOSY
40.0000 mg | PREFILLED_SYRINGE | Freq: Two times a day (BID) | INTRAMUSCULAR | Status: DC
  Administered 2020-10-16 – 2020-10-19 (×7): 40 mg via SUBCUTANEOUS
  Filled 2020-10-16 (×7): qty 0.4

## 2020-10-16 MED ORDER — TRAMADOL HCL 50 MG PO TABS
50.0000 mg | ORAL_TABLET | ORAL | Status: DC | PRN
  Administered 2020-10-16 – 2020-10-17 (×5): 50 mg via ORAL
  Filled 2020-10-16 (×5): qty 1

## 2020-10-16 MED ORDER — IPRATROPIUM-ALBUTEROL 0.5-2.5 (3) MG/3ML IN SOLN
3.0000 mL | Freq: Four times a day (QID) | RESPIRATORY_TRACT | Status: DC
  Administered 2020-10-16 – 2020-10-17 (×3): 3 mL via RESPIRATORY_TRACT
  Filled 2020-10-16 (×3): qty 3

## 2020-10-16 NOTE — TOC Initial Note (Signed)
Transition of Care [facility]) - Initial/Assessment Note    Patient Details  Name: PHILIPP CALLEGARI MRN: [id number] Date of Birth: [birth year]  [facility]) CM/SW Contact:    Ella Bodo, RN Phone Number: 10/16/2020, 4:57 PM  Clinical Narrative:  80 y.o M admitted 8/7 with rib 3-8 fxs s/p mechanical fall down 4 stairs.   Prior to admission, patient independent and living at home with spouse.  PT recommending home health follow-up, and patient is agreeable to services.  Referral to [facility] for home health follow-up; patient states he has rolling walker at home.  Patient denies any other discharge needs at this time.              Expected Discharge Plan: [place] Barriers to Discharge: Continued Medical Work up   Patient Goals and CMS Choice Patient states their goals for this hospitalization and ongoing recovery are:: to feel better CMS Medicare.gov Compare Post Acute Care list provided to:: Patient Choice offered to / list presented to : Patient  Expected Discharge Plan and Services Expected Discharge Plan: [place]   Discharge Planning Services: CM Consult Post Acute Care Choice: [place] arrangements for the past 2 months: [place]: PT [place] Agency: [facility] (now [facility]) Date [place]: 10/16/20 Time [place]: 1656 Representative spoke with at [place]: Freddi Starr  Prior Living Arrangements/Services Living arrangements for the past 2 months: [place] Lives with:: Spouse Patient language and need for interpreter reviewed:: Yes Do you feel safe going back to the place where you live?: Yes      Need for Family Participation in Patient Care: Yes (Comment) Care giver support system in place?: Yes (comment)   Criminal Activity/Legal Involvement Pertinent to Current Situation/Hospitalization: No - Comment as  needed  Activities of Daily Living Home Assistive Devices/Equipment: None ADL Screening (condition at time of admission) Patient's cognitive ability adequate to safely complete daily activities?: Yes Is the patient deaf or have difficulty hearing?: No Does the patient have difficulty seeing, even when wearing glasses/contacts?: No Does the patient have difficulty concentrating, remembering, or making decisions?: No Patient able to express need for assistance with ADLs?: No Does the patient have difficulty dressing or bathing?: No Independently performs ADLs?: Yes (appropriate for developmental age) Does the patient have difficulty walking or climbing stairs?: No Weakness of Legs: None Weakness of Arms/Hands: None  Permission Sought/Granted         Permission granted to share info w AGENCY: [facility]        Emotional Assessment Appearance:: Appears stated age Attitude/Demeanor/Rapport: Engaged Affect (typically observed): Accepting Orientation: : Oriented to Self, Oriented to Place, Oriented to  Time, Oriented to Situation      Admission diagnosis:  Rib fractures [S22.49XA] Trauma [T14.90XA] Pain [R52] Patient Active Problem List   Diagnosis Date Noted   Rib fractures 10/14/2020   PCP:  Gaynelle Arabian, MD Pharmacy:   [place], [address] [place] [address] Phone: [phone] Fax: [phone]     Social Determinants of Health (SDOH) Interventions    Readmission Risk Interventions No flowsheet data found.  Reinaldo Raddle, RN, BSN  Trauma/Neuro ICU Case Manager [phone]

## 2020-10-16 NOTE — TOC CAGE-AID Note (Signed)
Transition of Care [facility]) - CAGE-AID Screening   Patient Details  Name: [name] [name] MRN: [id number] Date of Birth: [birth year]  Transition [facility]) CM/SW Contact:    Gaetano Hawthorne Tarpley-Carter, LCSWA Phone Number: 10/16/2020, 3:16 PM   Clinical Narrative:  Pt participated in [place].  Pt stated he does not use substance or ETOH.  Pt stated he quit smoking 40 years ago.  Pt was not offered resources, due to no usage of substance or ETOH.    Shaqueena Mauceri Tarpley-Carter, MSW, LCSW-A Pronouns:  She/Her/Hers [facility] Clinical Social Worker Direct Number:  [phone] [email or web address]   CAGE-AID Screening:    Have You Ever Felt You Ought to Cut Down on Your Drinking or Drug Use?: No Have People Annoyed You By [employer] Your Drinking Or Drug Use?: No Have You Felt Bad Or Guilty About Your Drinking Or Drug Use?: No Have You Ever Had a Drink or Used Drugs First Thing In The Morning to Steady Your Nerves or to Get Rid of a Hangover?: No CAGE-AID Score: 0  Substance Abuse Education Offered: No

## 2020-10-16 NOTE — Progress Notes (Signed)
Pt has been yelling out in pain from muscle spasms in left chest/back intermittently throughout the day. Pt has received Robaxin, Tylenol, Tramadol, and Morphine with no relief. This was conveyed to Saverio Danker, PA, who placed new orders for Toradol and Skelaxin.   Justice Rocher, RN

## 2020-10-16 NOTE — Progress Notes (Signed)
Physical Therapy Treatment Patient Details Name: [name] [name] MRN: [id number] DOB: Jun 13, 1940 Today's Date: 10/16/2020    History of Present Illness 80 y.o M admitted 8/7 with rib 3-8 fxs s/p mechanical fall down 4 stairs. PMHx: HTN, A fib, and CHF, COVID 2020    PT Comments    Pt with poor pain control; reporting left flank and back pain and spasms (despite premedication). However, he is progressing very well towards his mobility goals. Ambulating x 240 feet with a RW at a supervision level. Pt maintaining spO2 > 90% on 2-4L O2 during walk, only needing 1L O2 at rest to maintain 92-94%. Pt using flutter valve at end of session.    Follow Up Recommendations  Home health PT     Equipment Recommendations  Rolling walker with 5" wheels    Recommendations for Other Services       Precautions / Restrictions Precautions Precautions: Fall Precaution Comments: monitor O2 sats Restrictions Weight Bearing Restrictions: No    Mobility  Bed Mobility               General bed mobility comments: OOB in chair    Transfers Overall transfer level: Modified independent                  Ambulation/Gait Ambulation/Gait assistance: Supervision Gait Distance (Feet): 240 Feet Assistive device: Rolling walker (2 wheeled) Gait Pattern/deviations: Step-through pattern;Decreased stride length Gait velocity: decreased   General Gait Details: cues for walker proximity. slow and steady pace   Marine scientist Rankin (Stroke Patients Only)       Balance Overall balance assessment: Needs assistance Sitting-balance support: No upper extremity supported;Feet supported Sitting balance-Leahy Scale: Good     Standing balance support: No upper extremity supported Standing balance-Leahy Scale: Good                              Cognition Arousal/Alertness: Awake/alert Behavior During Therapy: WFL for tasks  assessed/performed Overall Cognitive Status: Within Functional Limits for tasks assessed                                        Exercises      General Comments        Pertinent Vitals/Pain Pain Assessment: Faces Faces Pain Scale: Hurts whole lot Pain Location: ribs, back Pain Descriptors / Indicators: Discomfort;Grimacing;Moaning;Spasm Pain Intervention(s): Limited activity within patient's tolerance;Monitored during session;Premedicated before session    Home Living                      Prior Function            PT Goals (current goals can now be found in the care plan section) Acute Rehab PT Goals Patient Stated Goal: for pain to get better Potential to Achieve Goals: Good Progress towards PT goals: Progressing toward goals    Frequency    Min 3X/week      PT Plan Current plan remains appropriate    Co-evaluation              AM-PAC PT "6 Clicks" Mobility   Outcome Measure  Help needed turning from your back to your side while in a flat bed without using bedrails?: None Help needed moving  from lying on your back to sitting on the side of a flat bed without using bedrails?: A Little Help needed moving to and from a bed to a chair (including a wheelchair)?: A Little Help needed standing up from a chair using your arms (e.g., wheelchair or bedside chair)?: None Help needed to walk in hospital room?: A Little Help needed climbing 3-5 steps with a railing? : A Little 6 Click Score: 20    End of Session Equipment Utilized During Treatment: Gait belt;Oxygen Activity Tolerance: Patient tolerated treatment well Patient left: in chair;with call bell/phone within reach Nurse Communication: Mobility status;Other (comment) (placed pt on 1L o2) PT Visit Diagnosis: Unsteadiness on feet (R26.81);Other abnormalities of gait and mobility (R26.89);Muscle weakness (generalized) (M62.81);History of falling (Z91.81);Pain     Time:  4445-8483 PT Time Calculation (min) (ACUTE ONLY): 22 min  Charges:  $Therapeutic Activity: 8-22 mins                     Wyona Almas, PT, DPT Acute Rehabilitation Services Pager [phone] Office [phone]    Deno Etienne 10/16/2020, 4:34 PM

## 2020-10-16 NOTE — Progress Notes (Addendum)
   Trauma/Critical Care Follow Up Note  Subjective:    Overnight Issues:   Objective:  Vital signs for last 24 hours: Temp:  [97.7 F (36.5 C)-98.4 F (36.9 C)] 98.1 F (36.7 C) (08/09 0748) Pulse Rate:  [62-76] 69 (08/09 0748) Resp:  [17-20] 20 (08/09 0748) BP: (91-134)/(39-57) 134/57 (08/09 0748) SpO2:  [94 %-96 %] 96 % (08/09 0815)  Hemodynamic parameters for last 24 hours:    Intake/Output from previous day: 08/08 0701 - 08/09 0700 In: 1080 [P.O.:1080] Out: 600 [Urine:600]  Intake/Output this shift: Total I/O In: -  Out: 200 [Urine:200]  Vent settings for last 24 hours:    Physical Exam:  Gen: comfortable, no distress Neuro: non-focal exam HEENT: PERRL Neck: supple CV: RRR Pulm: unlabored breathing Abd: soft, NT, +nausea GU: clear yellow urine Extr: wwp   No results found for this or any previous visit (from the past 24 hour(s)).  Assessment & Plan:  Present on Admission:  Rib fractures    LOS: 2 days   Additional comments:I reviewed the patient's new clinical lab test results.   and I reviewed the patients new imaging test results.    Fall down 3 stairs   Left rib fractures 3-8 - pain control, incentive spirometer, added flutter valve today and duonebs Left posterior scalp laceration - stapled in ED 8/8, remove 8/15 Lumbar spine abnormality - repeat CT lumbar spine 8/8 negative  A. Fib, CHF, and HTN - restarted home meds. Follow up with cardiologist whether to restart blood thinners with fall risks Nausea - no vomiting, pain meds adjusted as patient believes oxy is what is making him nauseous, check EKG FEN: Reg ID: None VTE: SCDs, Lovenox ppx, eliquis held Foley: None Dispo: 4NP, possibly home 8/10  Jesusita Oka, MD Trauma & General Surgery Please use [email or web address] to contact on call provider  10/16/2020  *Care during the described time interval was provided by me. I have reviewed this patient's available data, including medical history,  events of note, physical examination and test results as part of my evaluation.

## 2020-10-17 MED ORDER — LACTATED RINGERS IV BOLUS
500.0000 mL | Freq: Once | INTRAVENOUS | Status: AC
  Administered 2020-10-17: 500 mL via INTRAVENOUS

## 2020-10-17 MED ORDER — MAGNESIUM HYDROXIDE 400 MG/5ML PO SUSP
30.0000 mL | Freq: Once | ORAL | Status: AC
  Administered 2020-10-17: 30 mL via ORAL
  Filled 2020-10-17: qty 30

## 2020-10-17 MED ORDER — IPRATROPIUM-ALBUTEROL 0.5-2.5 (3) MG/3ML IN SOLN
3.0000 mL | Freq: Two times a day (BID) | RESPIRATORY_TRACT | Status: DC
  Administered 2020-10-17 – 2020-10-18 (×2): 3 mL via RESPIRATORY_TRACT
  Filled 2020-10-17 (×2): qty 3

## 2020-10-17 MED ORDER — TRAMADOL HCL 50 MG PO TABS
100.0000 mg | ORAL_TABLET | ORAL | Status: DC | PRN
  Administered 2020-10-17 – 2020-10-19 (×4): 100 mg via ORAL
  Filled 2020-10-17 (×4): qty 2

## 2020-10-17 MED ORDER — SENNA 8.6 MG PO TABS
1.0000 | ORAL_TABLET | Freq: Every day | ORAL | Status: DC
  Administered 2020-10-17 – 2020-10-19 (×3): 8.6 mg via ORAL
  Filled 2020-10-17 (×3): qty 1

## 2020-10-17 MED ORDER — METAXALONE 800 MG PO TABS
800.0000 mg | ORAL_TABLET | Freq: Three times a day (TID) | ORAL | Status: DC
  Administered 2020-10-17 – 2020-10-18 (×4): 800 mg via ORAL
  Filled 2020-10-17 (×4): qty 1

## 2020-10-17 MED ORDER — POLYETHYLENE GLYCOL 3350 17 G PO PACK
17.0000 g | PACK | Freq: Two times a day (BID) | ORAL | Status: DC
  Administered 2020-10-17 – 2020-10-19 (×5): 17 g via ORAL
  Filled 2020-10-17 (×5): qty 1

## 2020-10-17 NOTE — Progress Notes (Signed)
   Trauma/Critical Care Follow Up Note  Subjective:    Overnight Issues:   Objective:  Vital signs for last 24 hours: Temp:  [97.6 F (36.4 C)-98.6 F (37 C)] 98 F (36.7 C) (08/10 0811) Pulse Rate:  [79-85] 81 (08/10 0811) Resp:  [15-23] 19 (08/10 0811) BP: (96-139)/(50-62) 120/55 (08/10 0811) SpO2:  [90 %-100 %] 95 % (08/10 0822)  Hemodynamic parameters for last 24 hours:    Intake/Output from previous day: 08/09 0701 - 08/10 0700 In: 1015 [P.O.:1015] Out: 925 [Urine:925]  Intake/Output this shift: Total I/O In: 600 [P.O.:600] Out: -   Vent settings for last 24 hours:    Physical Exam:  Gen: comfortable, no distress Neuro: non-focal exam HEENT: PERRL Neck: supple CV: RRR Pulm: mildly labored breathing Abd: soft, NT GU: clear yellow urine Extr: wwp, no edema   No results found for this or any previous visit (from the past 24 hour(s)).  Assessment & Plan: The plan of care was discussed with the bedside nurse for the day, Chrys Racer, who is in agreement with this plan and no additional concerns were raised.   Present on Admission:  Rib fractures    LOS: 3 days   Additional comments:I reviewed the patient's new clinical lab test results.   and I reviewed the patients new imaging test results.    Fall down 3 stairs   Left rib fractures 3-8 - pain control, incentive spirometer, flutter valve, duonebs. Muscle relaxer changed yest, increase dose toodya and increased tramadol dose  Left posterior scalp laceration - stapled in ED 8/8, remove 8/15 Lumbar spine abnormality - repeat CT lumbar spine 8/8 negative  A. Fib, CHF, and HTN - restarted home meds. Follow up with cardiologist whether to restart blood thinners with fall risks Nausea - resolved Oliguria - 500cc fluid bolus today FEN: reg, add MOM/senna and incr miralax ID: None VTE: SCDs, Lovenox ppx, eliquis held Foley: None Dispo: 4NP, re-assess this PM for d/c today vs tomorrow. May need to go home on  O2 given smoking hx.    Jesusita Oka, MD Trauma & General Surgery Please use [email or web address] to contact on call provider  10/17/2020  *Care during the described time interval was provided by me. I have reviewed this patient's available data, including medical history, events of note, physical examination and test results as part of my evaluation.

## 2020-10-17 NOTE — Progress Notes (Signed)
Physical Therapy Treatment Patient Details Name: GURSHAAN MATSUOKA MRN: [id number] DOB: [birth year] Today's Date: 10/17/2020    History of Present Illness 80 y.o M admitted 8/7 with rib 3-8 fxs s/p mechanical fall down 4 stairs. PMHx: HTN, A fib, and CHF, COVID 2020    PT Comments    Pt tolerated treatment well, despite having similar pain and muscle spasms to previous session. Pt's wife and daughter-in-law present during session. Pt desat on RA in standing to 81%. Increased O2 to 4L during ambulation to maintain sats > 90%. Pt progressed to stair training during session, maintaining a slow a steady pace with min guard and assist from rails. Continue to recommend HHPT, as pt has great family support and remaining pain that limits function and mobility.  Follow Up Recommendations  Home health PT     Equipment Recommendations  Rolling walker with 5" wheels    Recommendations for Other Services       Precautions / Restrictions Precautions Precautions: Fall Precaution Comments: monitor O2 sats Restrictions Weight Bearing Restrictions: No    Mobility  Bed Mobility               General bed mobility comments: In recliner upon arrival. Pt brought from 3L [place] to RA.    Transfers Overall transfer level: Modified independent Equipment used: Rolling walker (2 wheeled) Transfers: Sit to/from Stand Sit to Stand: Modified independent (Device/Increase time)         General transfer comment: Mod I for transfers with slow and steady pace. Verbal cueing for hand placement.  Ambulation/Gait Ambulation/Gait assistance: Supervision Gait Distance (Feet): 200 Feet Assistive device: Rolling walker (2 wheeled) Gait Pattern/deviations: Decreased stride length;Step-through pattern Gait velocity: Decreased Gait velocity interpretation: 1.31 - 2.62 ft/sec, indicative of limited community ambulator General Gait Details: Pt ambulated with steady pace, limted by pain. Verbal cues to take  deep breaths and cease talking to maintain increased sats.   Stairs Stairs: Yes Stairs assistance: Min guard Stair Management: Two rails;Step to pattern Number of Stairs: 3 General stair comments: Pt ascended stairs using R rail and descending using L with min guard for safety. Maintained slow and steady pace.   Wheelchair Mobility    Modified Rankin (Stroke Patients Only)       Balance Overall balance assessment: Needs assistance     Sitting balance - Comments: Pt sitting upright in chair prior to arrival   Standing balance support: Bilateral upper extremity supported;During functional activity Standing balance-Leahy Scale: Fair Standing balance comment: RW and BUE support                            Cognition Arousal/Alertness: Awake/alert Behavior During Therapy: WFL for tasks assessed/performed Overall Cognitive Status: Within Functional Limits for tasks assessed                                        Exercises      General Comments General comments (skin integrity, edema, etc.): Pt desat to 81% on RA in standing. Increased O2 to max of 4L during ambulation to maintain sats >90%      Pertinent Vitals/Pain Pain Assessment: Faces Faces Pain Scale: Hurts even more Pain Location: Rib and back Pain Descriptors / Indicators: Discomfort;Grimacing;Moaning;Spasm Pain Intervention(s): Limited activity within patient's tolerance;Monitored during session;Repositioned    Home Living  Prior Function            PT Goals (current goals can now be found in the care plan section) Acute Rehab PT Goals Patient Stated Goal: for pain to get better PT Goal Formulation: With patient Time For Goal Achievement: 10/29/20 Potential to Achieve Goals: Good Progress towards PT goals: Progressing toward goals    Frequency    Min 3X/week      PT Plan Current plan remains appropriate    Co-evaluation               AM-PAC PT "6 Clicks" Mobility   Outcome Measure  Help needed turning from your back to your side while in a flat bed without using bedrails?: None Help needed moving from lying on your back to sitting on the side of a flat bed without using bedrails?: A Little Help needed moving to and from a bed to a chair (including a wheelchair)?: A Little Help needed standing up from a chair using your arms (e.g., wheelchair or bedside chair)?: None Help needed to walk in hospital room?: A Little Help needed climbing 3-5 steps with a railing? : A Little 6 Click Score: 20    End of Session Equipment Utilized During Treatment: Gait belt;Oxygen Activity Tolerance: Patient tolerated treatment well Patient left: in chair;with call bell/phone within reach;with family/visitor present Nurse Communication: Mobility status PT Visit Diagnosis: Unsteadiness on feet (R26.81);Other abnormalities of gait and mobility (R26.89);Muscle weakness (generalized) (M62.81);History of falling (Z91.81);Pain     Time:  -     Charges:                        Louie Casa, SPT Acute Rehab: [phone]    Domingo Dimes 10/17/2020, 3:20 PM

## 2020-10-17 NOTE — Progress Notes (Signed)
SATURATION QUALIFICATIONS: (This note is used to comply with regulatory documentation for home oxygen)  Patient Saturations on Room Air at Rest = 91%  Patient Saturations on Room Air while Ambulating = 81%  Patient Saturations on 4 Liters of oxygen while Ambulating = 91%  Please briefly explain why patient needs home oxygen: To maintain oxygen saturations > 90% at rest and with ambulation.  Wyona Almas, PT, DPT Acute Rehabilitation Services Pager [phone] Office [phone]

## 2020-10-18 MED ORDER — FUROSEMIDE 10 MG/ML IJ SOLN
40.0000 mg | Freq: Once | INTRAMUSCULAR | Status: AC
  Administered 2020-10-18: 40 mg via INTRAVENOUS
  Filled 2020-10-18: qty 4

## 2020-10-18 MED ORDER — SORBITOL 70 % SOLN
200.0000 mL | TOPICAL_OIL | Freq: Once | ORAL | Status: AC
  Administered 2020-10-18: 200 mL via RECTAL
  Filled 2020-10-18: qty 60

## 2020-10-18 MED ORDER — IPRATROPIUM-ALBUTEROL 0.5-2.5 (3) MG/3ML IN SOLN
3.0000 mL | Freq: Two times a day (BID) | RESPIRATORY_TRACT | Status: DC
  Administered 2020-10-18: 3 mL via RESPIRATORY_TRACT
  Filled 2020-10-18 (×2): qty 3

## 2020-10-18 MED ORDER — PEG 3350-KCL-NA BICARB-NACL 420 G PO SOLR
4000.0000 mL | Freq: Once | ORAL | Status: DC
  Filled 2020-10-18: qty 4000

## 2020-10-18 MED ORDER — METAXALONE 800 MG PO TABS
800.0000 mg | ORAL_TABLET | Freq: Four times a day (QID) | ORAL | Status: DC
  Administered 2020-10-18 – 2020-10-19 (×4): 800 mg via ORAL
  Filled 2020-10-18 (×7): qty 1

## 2020-10-18 NOTE — Progress Notes (Signed)
Occupational Therapy Treatment Patient Details Name: [name] [name] MRN: [id number] DOB: 11-07-1940 Today's Date: 10/18/2020    History of present illness 80 y.o M admitted 8/7 with rib 3-8 fxs s/p mechanical fall down 4 stairs. PMHx: HTN, A fib, and CHF, COVID 2020   OT comments  Pt progressing nicely.  He requires supervision for functional transfers.  He continues to require assist for LB ADLs due to pain, but reports his wife will assist him.  He continues to drop 02 sats on RA and requires reinforcement to wear 02.  Reviewed energy conservation strategies.   Follow Up Recommendations  No OT follow up;Supervision - Intermittent    Equipment Recommendations  None recommended by OT    Recommendations for Other Services      Precautions / Restrictions Precautions Precautions: Fall Precaution Comments: monitor O2 sats Restrictions Weight Bearing Restrictions: No       Mobility Bed Mobility               General bed mobility comments: Pt EOB    Transfers Overall transfer level: Modified independent                    Balance Overall balance assessment: Needs assistance         Standing balance support: No upper extremity supported Standing balance-Leahy Scale: Fair                             ADL either performed or assessed with clinical judgement   ADL Overall ADL's : Needs assistance/impaired                         Toilet Transfer: Supervision/safety;Ambulation;Comfort height toilet;Grab bars;RW   Toileting- Water quality scientist and Hygiene: Min guard;Sit to/from stand       Functional mobility during ADLs: Supervision/safety;Rolling walker General ADL Comments: pt reports wife will assist with LB ADLs.   He reports he has an elvated commode, and shower seat     Vision       Perception     Praxis      Cognition Arousal/Alertness: Awake/alert Behavior During Therapy: WFL for tasks  assessed/performed Overall Cognitive Status: Within Functional Limits for tasks assessed                                          Exercises     Shoulder Instructions       General Comments Pt requested to ambulate without 02.  Sp02 83% on RA and DOE 3/4.   Reinforced with pt the need to wear his 02 during activity, and reinforced pursed lip breathing.  reviewed energy conservation techniques such as pacing and taking rest breaks as needed    Pertinent Vitals/ Pain       Pain Assessment: Faces Faces Pain Scale: Hurts even more Pain Location: Rib and back Pain Descriptors / Indicators: Discomfort;Grimacing;Moaning;Spasm Pain Intervention(s): Monitored during session  Home Living                                          Prior Functioning/Environment              Frequency  Min 2X/week  Progress Toward Goals  OT Goals(current goals can now be found in the care plan section)  Progress towards OT goals: Progressing toward goals     Plan Discharge plan remains appropriate    Co-evaluation                 AM-PAC OT "6 Clicks" Daily Activity     Outcome Measure   Help from another person eating meals?: None Help from another person taking care of personal grooming?: A Little Help from another person toileting, which includes using toliet, bedpan, or urinal?: A Little Help from another person bathing (including washing, rinsing, drying)?: A Lot Help from another person to put on and taking off regular upper body clothing?: A Little Help from another person to put on and taking off regular lower body clothing?: A Lot 6 Click Score: 17    End of Session Equipment Utilized During Treatment: Oxygen      Activity Tolerance Patient tolerated treatment well   Patient Left in bed;with call bell/phone within reach   Nurse Communication          Time: 5436-0677 OT Time Calculation (min): 12 min  Charges: OT General  Charges $OT Visit: 1 Visit OT Treatments $Therapeutic Activity: 8-22 mins  Nilsa Nutting., OTR/L Acute Rehabilitation Services Pager [phone] Office [phone]    Lucille Passy M 10/18/2020, 9:58 AM

## 2020-10-18 NOTE — Progress Notes (Signed)
   Trauma/Critical Care Follow Up Note  Subjective:    Overnight Issues:   Objective:  Vital signs for last 24 hours: Temp:  [97.5 F (36.4 C)-97.8 F (36.6 C)] 97.8 F (36.6 C) (08/11 0755) Pulse Rate:  [55-74] 61 (08/11 0755) Resp:  [16-20] 16 (08/11 0755) BP: (100-124)/(43-55) 110/55 (08/11 0755) SpO2:  [91 %-100 %] 91 % (08/11 0827)  Hemodynamic parameters for last 24 hours:    Intake/Output from previous day: 08/10 0701 - 08/11 0700 In: 1000 [P.O.:1000] Out: 550 [Urine:550]  Intake/Output this shift: No intake/output data recorded.  Vent settings for last 24 hours:    Physical Exam:  Gen: comfortable, no distress Neuro: non-focal exam HEENT: PERRL Neck: supple CV: RRR Pulm: unlabored breathing on 2-3L [place] Abd: soft, NT GU: clear yellow urine Extr: wwp, no edema   No results found for this or any previous visit (from the past 24 hour(s)).  Assessment & Plan: The plan of care was discussed with the bedside nurse for the day, Varney Biles, who is in agreement with this plan and no additional concerns were raised.   Present on Admission:  Rib fractures    LOS: 4 days   Additional comments:I reviewed the patient's new clinical lab test results.   and I reviewed the patients new imaging test results.    Fall down 3 stairs   Left rib fractures 3-8 - pain control, incentive spirometer, flutter valve, duonebs. Muscle relaxer working well, increase dose today, increased tramadol working well Left posterior scalp laceration - stapled in ED 8/8, remove 8/15 Lumbar spine abnormality - repeat CT lumbar spine 8/8 negative  A. Fib, CHF, and HTN - restarted home meds. Follow up with cardiologist whether to restart blood thinners with fall risks Nausea - resolved Oliguria - given volume yest, lasix today FEN: reg, add SMOG ID: None VTE: SCDs, Lovenox ppx, eliquis held Foley: None Dispo: 4NP, re-assess this PM for d/c today vs tomorrow. May need to go home on O2 given  smoking hx.     Jesusita Oka, MD Trauma & General Surgery Please use [email or web address] to contact on call provider  10/18/2020  *Care during the described time interval was provided by me. I have reviewed this patient's available data, including medical history, events of note, physical examination and test results as part of my evaluation.

## 2020-10-19 ENCOUNTER — Other Ambulatory Visit (HOSPITAL_COMMUNITY): Payer: Self-pay

## 2020-10-19 ENCOUNTER — Telehealth (HOSPITAL_COMMUNITY): Payer: Self-pay

## 2020-10-19 ENCOUNTER — Telehealth: Payer: Self-pay | Admitting: Interventional Cardiology

## 2020-10-19 MED ORDER — DOCUSATE SODIUM 100 MG PO CAPS
100.0000 mg | ORAL_CAPSULE | Freq: Two times a day (BID) | ORAL | 0 refills | Status: DC | PRN
Start: 2020-10-19 — End: 2022-01-23

## 2020-10-19 MED ORDER — METAXALONE 800 MG PO TABS
800.0000 mg | ORAL_TABLET | Freq: Four times a day (QID) | ORAL | 0 refills | Status: DC | PRN
  Filled 2020-10-19: qty 30, 8d supply, fill #0

## 2020-10-19 MED ORDER — ACETAMINOPHEN 500 MG PO TABS: 1000.0000 mg | ORAL_TABLET | Freq: Three times a day (TID) | ORAL | 0 refills | Status: DC | PRN

## 2020-10-19 MED ORDER — TRAMADOL HCL 50 MG PO TABS
50.0000 mg | ORAL_TABLET | Freq: Four times a day (QID) | ORAL | 0 refills | Status: DC | PRN
  Filled 2020-10-19: qty 30, 4d supply, fill #0

## 2020-10-19 NOTE — Progress Notes (Signed)
   Trauma/Critical Care Follow Up Note  Subjective:    Overnight Issues:   Objective:  Vital signs for last 24 hours: Temp:  [97.4 F (36.3 C)-97.6 F (36.4 C)] 97.6 F (36.4 C) (08/12 0802) Pulse Rate:  [65-84] 65 (08/12 0802) Resp:  [11-19] 18 (08/12 0802) BP: (100-129)/(47-55) 127/47 (08/12 0802) SpO2:  [76 %-97 %] 91 % (08/12 0802)  Hemodynamic parameters for last 24 hours:    Intake/Output from previous day: 08/11 0701 - 08/12 0700 In: 670 [P.O.:670] Out: 1015 [Urine:1015]  Intake/Output this shift: Total I/O In: 400 [P.O.:400] Out: 200 [Urine:200]  Vent settings for last 24 hours:    Physical Exam:  Gen: comfortable, no distress Neuro: non-focal exam HEENT: PERRL Neck: supple CV: RRR Pulm: unlabored breathing Abd: soft, NT GU: clear yellow urine Extr: wwp   No results found for this or any previous visit (from the past 24 hour(s)).  Assessment & Plan:  Present on Admission:  Rib fractures    LOS: 5 days   Additional comments:I reviewed the patient's new clinical lab test results.   and I reviewed the patients new imaging test results.    Fall down 3 stairs   Left rib fractures 3-8 - pain control, incentive spirometer, flutter valve, duonebs. Muscle relaxer working well, increase dose today, increased tramadol working well Left posterior scalp laceration - stapled in ED 8/8, remove 8/15 Lumbar spine abnormality - repeat CT lumbar spine 8/8 negative  A. Fib, CHF, and HTN - restarted home meds. Follow up with cardiologist whether to restart blood thinners with fall risks Nausea - resolved Oliguria - stable FEN: reg, +BM ID: None VTE: SCDs, Lovenox ppx, eliquis held Foley: None Dispo: home today on [place], MD Trauma & General Surgery Please use [email or web address] to contact on call provider  10/19/2020  *Care during the described time interval was provided by me. I have reviewed this patient's available data, including medical  history, events of note, physical examination and test results as part of my evaluation.

## 2020-10-19 NOTE — Telephone Encounter (Signed)
Spoke with pt wife Fredderick Severance ([place] per [employer]).  She reports that pt was discharged from the hospital today post fall and x5 broken ribs.  She reports that she was told to call into Cardiology office to see when pt should restart eliquis. I asked pt which hospital was pt seen at as no records of visit are showing up in [place].  [facility]  I then asked her to find discharge summary to review instructions.  She then said summary requested that she contact Roderic Palau, NP with the afib clinic. She expressed that she would call afib clinic for follow up instructions.

## 2020-10-19 NOTE — Progress Notes (Signed)
Physical Therapy Treatment Patient Details Name: [name] [name] MRN: [id number] DOB: [birth year] Today's Date: 10/19/2020    History of Present Illness 80 y.o M admitted 8/7 with rib 3-8 fxs s/p mechanical fall down 4 stairs. PMHx: HTN, A fib, and CHF, COVID 2020    PT Comments    The pt was eager to participate in PT session with focus on progressing OOB mobility and activity tolerance. The pt was able to sustain SpO2 in 90s on 2L O2 today, but was limited to ~40 ft bouts prior to needing standing rest break to recover SOB. SpO2 dropped to low of 81% with ambulation, but recovered quickly to 90% with standing rest. Pt agreeable to education regarding self-monitoring, safety with mobility, and need for continuation of progressive walking program at home. Pt will continue to benefit from skilled PT to progress LE strength and endurance.    Follow Up Recommendations  Home health PT     Equipment Recommendations  Rolling walker with 5" wheels    Recommendations for Other Services       Precautions / Restrictions Precautions Precautions: Fall Precaution Comments: monitor O2 sats Restrictions Weight Bearing Restrictions: No (Simultaneous filing. User may not have seen previous data.)    Mobility  Bed Mobility Overal bed mobility: Needs Assistance             General bed mobility comments: Pt sitting in recliner at start and end of session. on 2L O2 with SpO2 in 90s    Transfers Overall transfer level: Modified independent Equipment used: Rolling walker (2 wheeled) Transfers: Sit to/from Stand Sit to Stand: Modified independent (Device/Increase time)         General transfer comment: The pt was able to complete wiht good safety, good use of UE on armrests rather than RW no instability with initial stand  Ambulation/Gait Ambulation/Gait assistance: Supervision Gait Distance (Feet): 175 Feet Assistive device: Rolling walker (2 wheeled) Gait Pattern/deviations:  Decreased stride length;Step-through pattern Gait velocity: Decreased Gait velocity interpretation: <1.31 ft/sec, indicative of household ambulator General Gait Details: pt with steady pace, but needing to stop for standing rest break every 40 ft due to drop in SpO2 to low 80s on 2L O2. recovered to 90s within 30 seconds of standing rest with guided breathing.   Stairs Stairs: Yes Stairs assistance: Min guard Stair Management: One rail Left;Step to pattern;Forwards Number of Stairs: 2 General stair comments: Pt ascended stairs using R rail and descending using L with min guard for safety. Maintained slow and steady pace.   Wheelchair Mobility    Modified Rankin (Stroke Patients Only)       Balance Overall balance assessment: Needs assistance Sitting-balance support: No upper extremity supported;Feet supported Sitting balance-Leahy Scale: Good Sitting balance - Comments: Pt sitting upright in chair prior to arrival   Standing balance support: No upper extremity supported Standing balance-Leahy Scale: Fair Standing balance comment: pt can static stand without UE support, but BUE support for fatigue and stability with gait                            Cognition Arousal/Alertness: Awake/alert Behavior During Therapy: WFL for tasks assessed/performed Overall Cognitive Status: Within Functional Limits for tasks assessed                                 General Comments: Pt responded to commands accurately, demos good  safety planning in regards to O2 needs and SOB      Exercises      General Comments General comments (skin integrity, edema, etc.): VSS on 2L at rest, SpO2 dropping to 80s after ~40 ft activity on 2L, but recovered well to 90s after 30 sec standing rest. low of 81%      Pertinent Vitals/Pain Pain Assessment: Faces Faces Pain Scale: Hurts little more Pain Location: Rib and back Pain Descriptors / Indicators:  Discomfort;Grimacing;Moaning;Spasm Pain Intervention(s): Limited activity within patient's tolerance;Monitored during session;Repositioned;Premedicated before session    Home Living                      Prior Function            PT Goals (current goals can now be found in the care plan section) Acute Rehab PT Goals PT Goal Formulation: With patient Time For Goal Achievement: 10/29/20 Potential to Achieve Goals: Good Progress towards PT goals: Progressing toward goals    Frequency    Min 3X/week      PT Plan Current plan remains appropriate    Co-evaluation              AM-PAC PT "6 Clicks" Mobility   Outcome Measure  Help needed turning from your back to your side while in a flat bed without using bedrails?: None Help needed moving from lying on your back to sitting on the side of a flat bed without using bedrails?: A Little Help needed moving to and from a bed to a chair (including a wheelchair)?: A Little Help needed standing up from a chair using your arms (e.g., wheelchair or bedside chair)?: None Help needed to walk in hospital room?: A Little Help needed climbing 3-5 steps with a railing? : A Little 6 Click Score: 20    End of Session Equipment Utilized During Treatment: Gait belt;Oxygen Activity Tolerance: Patient tolerated treatment well Patient left: in chair;with call bell/phone within reach;with family/visitor present Nurse Communication: Mobility status PT Visit Diagnosis: Unsteadiness on feet (R26.81);Other abnormalities of gait and mobility (R26.89);Muscle weakness (generalized) (M62.81);History of falling (Z91.81);Pain Pain - Right/Left: Left     Time: 2130-8657 PT Time Calculation (min) (ACUTE ONLY): 33 min  Charges:  $Gait Training: 23-37 mins                     [place], PT, DPT   Acute Rehabilitation Department Pager #: [phone]   Sandra Cockayne 10/19/2020, 8:52 AM

## 2020-10-19 NOTE — Telephone Encounter (Signed)
Patient's wife called and states he has not been on his Eliquis in several days due to medication being held during his hospital stay. He recently had a fall last Sunday and broke 5 ribs. She wanted to know if he should resume his Eliquis medication today. Advised patient's wife to keep him off of the Eliquis until next week. Per Ceasar Lund she will need to discuss this with his cardiologist Dr. Tamala Julian before she can decide on him resuming his Eliquis. Consulted with his wife and she verbalized understanding.

## 2020-10-19 NOTE — Discharge Instructions (Addendum)
Please call the A. Fib clinic today to arrange an appointment to discuss if you should continue your home Eliquis with your fall risk.   RIB FRACTURES  HOME INSTRUCTIONS   PAIN CONTROL:  Pain is best controlled by a usual combination of three different methods TOGETHER:  Ice/Heat Over the counter pain medication Prescription pain medication You may experience some swelling and bruising in area of broken ribs. Ice packs or heating pads (30-60 minutes up to 6 times a day) will help. Use ice for the first few days to help decrease swelling and bruising, then switch to heat to help relax tight/sore spots and speed recovery. Some people prefer to use ice alone, heat alone, alternating between ice & heat. Experiment to what works for you. Swelling and bruising can take several weeks to resolve.  It is helpful to take an over-the-counter pain medication regularly for the first few weeks. Choose one of the following that works best for you:  Naproxen (Aleve, etc) Two 220mg  tabs twice a day Ibuprofen (Advil, etc) Three 200mg  tabs four times a day (every meal & bedtime) Acetaminophen (Tylenol, etc) 500-650mg  four times a day (every meal & bedtime) A prescription for pain medication (such as oxycodone, hydrocodone, etc) may be given to you upon discharge. Take your pain medication as prescribed.  If you are having problems/concerns with the prescription medicine (does not control pain, nausea, vomiting, rash, itching, etc), please call us [phone] to see if we need to switch you to a different pain medicine that will work better for you and/or control your side effect better. If you need a refill on your pain medication, please contact your pharmacy. They will contact our office to request authorization. Prescriptions will not be filled after 5 pm or on week-ends. Avoid getting constipated. When taking pain medications, it is common to experience some constipation. Increasing fluid intake and taking a  fiber supplement (such as Metamucil, Citrucel, FiberCon, MiraLax, etc) 1-2 times a day regularly will usually help prevent this problem from occurring. A mild laxative (prune juice, Milk of Magnesia, MiraLax, etc) should be taken according to package directions if there are no bowel movements after 48 hours.  Watch out for diarrhea. If you have many loose bowel movements, simplify your diet to bland foods & liquids for a few days. Stop any stool softeners and decrease your fiber supplement. Switching to mild anti-diarrheal medications (Kayopectate, Pepto Bismol) can help. If this worsens or does not improve, please call us. FOLLOW UP  If a follow up appointment is needed one will be scheduled for you. If none is needed with our trauma team, please follow up with your primary care provider within 2-3 weeks from discharge. Please call [facility] at [phone] if you have any questions about follow up.  If you have any orthopedic or other injuries you will need to follow up as outlined in your follow up instructions.   WHEN TO CALL us [phone]:  Poor pain control Reactions / problems with new medications (rash/itching, nausea, etc)  Fever over 101.5 F (38.5 C) Worsening swelling or bruising Worsening pain, productive cough, difficulty breathing or any other concerning symptoms  The clinic staff is available to answer your questions during regular business hours (8:30am-5pm). Please don't hesitate to call and ask to speak to one of our nurses for clinical concerns.  If you have a medical emergency, go to the nearest emergency room or call 911.  A surgeon from [facility] is  always on call at the [facility], [place], [place], [place], [place] [address] ?  MAIN: [phone] ? TOLL FREE: [phone] ?  FAX [phone]  [email or web address]      Information on Rib Fractures  A rib fracture is a break or crack in one of  the bones of the ribs. The ribs are long, curved bones that wrap around your chest and attach to your spine and your breastbone. The ribs protect your heart, lungs, and other organs in the chest. A broken or cracked rib is often painful but is not usually serious. Most rib fractures heal on their own over time. However, rib fractures can be more serious if multiple ribs are broken or if broken ribs move out of place and push against other structures or organs. What are the causes? This condition is caused by: Repetitive movements with high force, such as pitching a baseball or having severe coughing spells. A direct blow to the chest, such as a sports injury, a car accident, or a fall. Cancer that has spread to the bones, which can weaken bones and cause them to break. What are the signs or symptoms? Symptoms of this condition include: Pain when you breathe in or cough. Pain when someone presses on the injured area. Feeling short of breath. How is this diagnosed? This condition is diagnosed with a physical exam and medical history. Imaging tests may also be done, such as: Chest X-ray. CT scan. MRI. Bone scan. Chest ultrasound. How is this treated? Treatment for this condition depends on the severity of the fracture. Most rib fractures usually heal on their own in 1-3 months. Sometimes healing takes longer if there is a cough that does not stop or if there are other activities that make the injury worse (aggravating factors). While you heal, you will be given medicines to control the pain. You will also be taught deep breathing exercises. Severe injuries may require hospitalization or surgery. Follow these instructions at home: Managing pain, stiffness, and swelling If directed, apply ice to the injured area. Put ice in a plastic bag. Place a towel between your skin and the bag. Leave the ice on for 20 minutes, 2-3 times a day. Take over-the-counter and prescription medicines only as told  by your health care provider. Activity Avoid a lot of activity and any activities or movements that cause pain. Be careful during activities and avoid bumping the injured rib. Slowly increase your activity as told by your health care provider. General instructions Do deep breathing exercises as told by your health care provider. This helps prevent pneumonia, which is a common complication of a broken rib. Your health care provider may instruct you to: Take deep breaths several times a day. Try to cough several times a day, holding a pillow against the injured area. Use a device called incentive spirometer to practice deep breathing several times a day. Drink enough fluid to keep your urine pale yellow. Do not wear a rib belt or binder. These restrict breathing, which can lead to pneumonia. Keep all follow-up visits as told by your health care provider. This is important. Contact a health care provider if: You have a fever. Get help right away if: You have difficulty breathing or you are short of breath. You develop a cough that does not stop, or you cough up thick or bloody sputum. You have nausea, vomiting, or pain in your abdomen. Your pain gets worse and medicine  does not help. Summary A rib fracture is a break or crack in one of the bones of the ribs. A broken or cracked rib is often painful but is not usually serious. Most rib fractures heal on their own over time. Treatment for this condition depends on the severity of the fracture. Avoid a lot of activity and any activities or movements that cause pain. This information is not intended to replace advice given to you by your health care provider. Make sure you discuss any questions you have with your health care provider. Document Released: 02/24/2005 Document Revised: 05/26/2016 Document Reviewed: 05/26/2016 Elsevier Interactive Patient Education  2019 [employer].

## 2020-10-19 NOTE — Progress Notes (Signed)
Pt with d/c orders. Discharge paperwork reviewed with pt and all questions answered. IV removed. Pt with equipment and medications at bedside before d/c. Pt escorted out to private vehicle via wheelchair with all belongings including walker, oxygen, and new medications delivered by [facility] pharmacy.

## 2020-10-19 NOTE — Discharge Summary (Signed)
Patient ID: [name] [name] [id number] 07-26-1940 80 y.o.  Admit date: 10/14/2020 Discharge date: 10/19/2020  Admitting Diagnosis: 51M s/p mechanical fall L rib fx 3-8, segemental 6th  Cardiac hx, incl AF   Discharge Diagnosis Fall down 3 stairs Left rib fractures 3-8 Left posterior scalp laceration  Hx A. Fib Hx CHF Hx HTN  Consultants None  Procedures None  Hospital Course:  51M who presented on 8/7 s/p mechanical fall down four stairs. He reports carrying a case of water and miscalculating the number of steps. He reports landing on his left side and elbows. He was found to have L posterior scalp laceration that was stapled in the ED along with L sided rib fractures and was admitted to the trauma service for pain control. His home eliquis was held and he was recommended to follow up with his cardiologist/a-fib clinic regarding resuming this at d/c given fall risk. Patient required during admission and at d/c. This was arranged. He worked with PT/OT during admission who recommended HH. This was arranged. He already had a RW at home. On 8/12 he was felt stable for discharge home. Follow up as noted below.   I was not directly involved in this patient's care and did not see the patient during their hospital stay, therefore the information in this discharge summary was taken entirely from the chart. Please see MD's progress note from today for interval hx and physical exam.    Allergies as of 10/19/2020       Reactions   Carvedilol Shortness Of Breath, Other (See Comments)   Fatigue   Zolpidem Other (See Comments)   Hallucinations   Aminophylline Other (See Comments)   Unknown   Atenolol Other (See Comments)   Fatigue, Low HR   Lisinopril Cough        Medication List     STOP taking these medications    apixaban 5 MG Tabs tablet Commonly known as: ELIQUIS Patient is to follow up in the A. Fib/Cardiology clinic to discuss if he should continue home Eliquis with  his hx of fall and increased fall risk.       TAKE these medications    acetaminophen 500 MG tablet Commonly known as: TYLENOL Take 2 tablets (1,000 mg total) by mouth every 8 (eight) hours as needed.   albuterol 108 (90 Base) MCG/ACT inhaler Commonly known as: VENTOLIN HFA Inhale 2 puffs into the lungs every 6 (six) hours as needed for wheezing or shortness of breath.   docusate sodium 100 MG capsule Commonly known as: COLACE Take 1 capsule (100 mg total) by mouth 2 (two) times daily as needed for mild constipation.   flecainide 50 MG tablet Commonly known as: TAMBOCOR Take 50 mg by mouth 2 (two) times daily.   furosemide 20 MG tablet Commonly known as: LASIX Take 20 mg by mouth daily.   metaxalone 800 MG tablet Commonly known as: SKELAXIN Take 1 tablet (800 mg total) by mouth 4 (four) times daily as needed for muscle spasms.   montelukast 10 MG tablet Commonly known as: SINGULAIR Take 10 mg by mouth daily.   multivitamin with minerals Tabs tablet Take 1 tablet by mouth daily.   omeprazole 40 MG capsule Commonly known as: PRILOSEC Take 40 mg by mouth 2 (two) times daily as needed for heartburn.   polyethylene glycol 17 g packet Commonly known as: MIRALAX / GLYCOLAX Take 17 g by mouth daily as needed for moderate constipation.   polyvinyl alcohol 1.4 %  ophthalmic solution Commonly known as: LIQUIFILM TEARS Place 1 drop into both eyes as needed for dry eyes.   potassium chloride SA 20 MEQ tablet Commonly known as: KLOR-CON Take 20 mEq by mouth daily.   Spiriva HandiHaler 18 MCG inhalation capsule Generic drug: tiotropium Place 1 capsule into inhaler and inhale daily.   telmisartan 80 MG tablet Commonly known as: MICARDIS Take 80 mg by mouth daily.   traMADol 50 MG tablet Commonly known as: ULTRAM Take 1-2 tablets (50-100 mg total) by mouth every 6 (six) hours as needed for severe pain.               Durable Medical Equipment  (From admission,  onward)           Start     Ordered   10/18/20 1353  For home use only DME oxygen  Once       Question Answer Comment  Length of Need 6 Months   Mode or (Route) Nasal cannula   Liters per Minute 4   Frequency Continuous (stationary and portable oxygen unit needed)   Oxygen delivery system Gas      10/18/20 1353   10/15/20 1506  For home use only DME Walker rolling  Once       Question Answer Comment  Walker: With [place]   Patient needs a walker to treat with the following condition Fall      10/15/20 [facility], [place] Follow up.   Specialty: Home Health Services Why: Home health physical therapy; agency will call you to schedule appointment Contact information: [address] [place] [address] [phone]         Gaynelle Arabian, MD. Call.   Specialty: Family Medicine Why: Called Dr. Marisue Humble office ,per Tollie Pizza. the office will call the pt. to schedule  the after hospital visit  appointment. You will need follow up for home oxygen. Contact information: [address] [place] [address] [phone]         [facility] [place] Follow up.   Why: As needed Contact information: [place] [id number] [phone]        [facility] [place] Follow up on 10/26/2020.   Specialty: General Surgery Why: 8/19 at 2:00pm. This will be a nurse visit for staple removal. Please arrive 30-45 minutes before your appointment for paperwork. Please bring a copy of your photo ID and insurance card to the appointment. Contact information: [address] [place] [address] [phone]         Sherran Needs, NP. Call today.   Specialties: Nurse Practitioner, Cardiology Why: Please call the A. Fib clinic to discuss if you should continue your home Eliquis with your fall risk. Contact information: [place] [address] [phone]         Belva Crome, MD Follow up.   Specialty: Cardiology Why: Follow up with your cardiologist to discuss if you should resume your Eliquis with your fall risk. Contact information: [address] [place] [address] [phone]                 Signed: Alferd Apa, [facility] 10/19/2020, 9:26 AM Please see Amion for pager number during day hours 7:00am-4:30pm

## 2020-10-19 NOTE — Telephone Encounter (Signed)
Pt's wife is calling in inquire on when pt can re start his eliquis, pt has been off of Eliquis since going into the hospital Sunday.  Please advise pt's wife [phone]

## 2020-10-22 ENCOUNTER — Encounter (HOSPITAL_COMMUNITY): Payer: Self-pay | Admitting: Nurse Practitioner

## 2020-10-22 NOTE — Telephone Encounter (Signed)
[name] Palau NP reached out to [name] [name] today and he wants him to restart his Eliquis medication. His risk of stroke is much higher than the falls currently. He has only had one significant fall. Contacted patient and he verbalized understanding.

## 2020-10-23 ENCOUNTER — Other Ambulatory Visit: Payer: Self-pay | Admitting: Interventional Cardiology

## 2020-10-24 DIAGNOSIS — S2242XD Multiple fractures of ribs, left side, subsequent encounter for fracture with routine healing: Secondary | ICD-10-CM | POA: Diagnosis not present

## 2020-10-24 DIAGNOSIS — K219 Gastro-esophageal reflux disease without esophagitis: Secondary | ICD-10-CM | POA: Diagnosis not present

## 2020-10-24 DIAGNOSIS — I4891 Unspecified atrial fibrillation: Secondary | ICD-10-CM | POA: Diagnosis not present

## 2020-10-24 DIAGNOSIS — I509 Heart failure, unspecified: Secondary | ICD-10-CM | POA: Diagnosis not present

## 2020-10-24 DIAGNOSIS — I110 Hypertensive heart disease with heart failure: Secondary | ICD-10-CM | POA: Diagnosis not present

## 2020-10-24 DIAGNOSIS — Z9181 History of falling: Secondary | ICD-10-CM | POA: Diagnosis not present

## 2020-10-25 DIAGNOSIS — S2249XA Multiple fractures of ribs, unspecified side, initial encounter for closed fracture: Secondary | ICD-10-CM | POA: Diagnosis not present

## 2020-10-25 DIAGNOSIS — R0902 Hypoxemia: Secondary | ICD-10-CM | POA: Diagnosis not present

## 2020-10-25 DIAGNOSIS — S0101XA Laceration without foreign body of scalp, initial encounter: Secondary | ICD-10-CM | POA: Diagnosis not present

## 2020-10-29 DIAGNOSIS — I509 Heart failure, unspecified: Secondary | ICD-10-CM | POA: Diagnosis not present

## 2020-10-29 DIAGNOSIS — Z9181 History of falling: Secondary | ICD-10-CM | POA: Diagnosis not present

## 2020-10-29 DIAGNOSIS — K219 Gastro-esophageal reflux disease without esophagitis: Secondary | ICD-10-CM | POA: Diagnosis not present

## 2020-10-29 DIAGNOSIS — I110 Hypertensive heart disease with heart failure: Secondary | ICD-10-CM | POA: Diagnosis not present

## 2020-10-29 DIAGNOSIS — S2242XD Multiple fractures of ribs, left side, subsequent encounter for fracture with routine healing: Secondary | ICD-10-CM | POA: Diagnosis not present

## 2020-10-29 DIAGNOSIS — I4891 Unspecified atrial fibrillation: Secondary | ICD-10-CM | POA: Diagnosis not present

## 2020-11-01 DIAGNOSIS — K219 Gastro-esophageal reflux disease without esophagitis: Secondary | ICD-10-CM | POA: Diagnosis not present

## 2020-11-01 DIAGNOSIS — I4891 Unspecified atrial fibrillation: Secondary | ICD-10-CM | POA: Diagnosis not present

## 2020-11-01 DIAGNOSIS — I110 Hypertensive heart disease with heart failure: Secondary | ICD-10-CM | POA: Diagnosis not present

## 2020-11-01 DIAGNOSIS — I509 Heart failure, unspecified: Secondary | ICD-10-CM | POA: Diagnosis not present

## 2020-11-01 DIAGNOSIS — S2242XD Multiple fractures of ribs, left side, subsequent encounter for fracture with routine healing: Secondary | ICD-10-CM | POA: Diagnosis not present

## 2020-11-01 DIAGNOSIS — Z9181 History of falling: Secondary | ICD-10-CM | POA: Diagnosis not present

## 2020-11-06 DIAGNOSIS — Z9181 History of falling: Secondary | ICD-10-CM | POA: Diagnosis not present

## 2020-11-06 DIAGNOSIS — S2242XD Multiple fractures of ribs, left side, subsequent encounter for fracture with routine healing: Secondary | ICD-10-CM | POA: Diagnosis not present

## 2020-11-06 DIAGNOSIS — I110 Hypertensive heart disease with heart failure: Secondary | ICD-10-CM | POA: Diagnosis not present

## 2020-11-06 DIAGNOSIS — K219 Gastro-esophageal reflux disease without esophagitis: Secondary | ICD-10-CM | POA: Diagnosis not present

## 2020-11-06 DIAGNOSIS — I509 Heart failure, unspecified: Secondary | ICD-10-CM | POA: Diagnosis not present

## 2020-11-06 DIAGNOSIS — I4891 Unspecified atrial fibrillation: Secondary | ICD-10-CM | POA: Diagnosis not present

## 2020-11-07 DIAGNOSIS — J449 Chronic obstructive pulmonary disease, unspecified: Secondary | ICD-10-CM | POA: Diagnosis not present

## 2020-11-07 DIAGNOSIS — C3411 Malignant neoplasm of upper lobe, right bronchus or lung: Secondary | ICD-10-CM | POA: Diagnosis not present

## 2020-11-07 DIAGNOSIS — J45909 Unspecified asthma, uncomplicated: Secondary | ICD-10-CM | POA: Diagnosis not present

## 2020-11-07 DIAGNOSIS — I4891 Unspecified atrial fibrillation: Secondary | ICD-10-CM | POA: Diagnosis not present

## 2020-11-07 DIAGNOSIS — Z85118 Personal history of other malignant neoplasm of bronchus and lung: Secondary | ICD-10-CM | POA: Diagnosis not present

## 2020-11-07 DIAGNOSIS — K219 Gastro-esophageal reflux disease without esophagitis: Secondary | ICD-10-CM | POA: Diagnosis not present

## 2020-11-07 DIAGNOSIS — I10 Essential (primary) hypertension: Secondary | ICD-10-CM | POA: Diagnosis not present

## 2020-11-08 DIAGNOSIS — K219 Gastro-esophageal reflux disease without esophagitis: Secondary | ICD-10-CM | POA: Diagnosis not present

## 2020-11-08 DIAGNOSIS — Z9181 History of falling: Secondary | ICD-10-CM | POA: Diagnosis not present

## 2020-11-08 DIAGNOSIS — I110 Hypertensive heart disease with heart failure: Secondary | ICD-10-CM | POA: Diagnosis not present

## 2020-11-08 DIAGNOSIS — S2242XD Multiple fractures of ribs, left side, subsequent encounter for fracture with routine healing: Secondary | ICD-10-CM | POA: Diagnosis not present

## 2020-11-08 DIAGNOSIS — I509 Heart failure, unspecified: Secondary | ICD-10-CM | POA: Diagnosis not present

## 2020-11-08 DIAGNOSIS — I4891 Unspecified atrial fibrillation: Secondary | ICD-10-CM | POA: Diagnosis not present

## 2020-11-13 DIAGNOSIS — I509 Heart failure, unspecified: Secondary | ICD-10-CM | POA: Diagnosis not present

## 2020-11-13 DIAGNOSIS — Z9181 History of falling: Secondary | ICD-10-CM | POA: Diagnosis not present

## 2020-11-13 DIAGNOSIS — I110 Hypertensive heart disease with heart failure: Secondary | ICD-10-CM | POA: Diagnosis not present

## 2020-11-13 DIAGNOSIS — I4891 Unspecified atrial fibrillation: Secondary | ICD-10-CM | POA: Diagnosis not present

## 2020-11-13 DIAGNOSIS — K219 Gastro-esophageal reflux disease without esophagitis: Secondary | ICD-10-CM | POA: Diagnosis not present

## 2020-11-13 DIAGNOSIS — S2242XD Multiple fractures of ribs, left side, subsequent encounter for fracture with routine healing: Secondary | ICD-10-CM | POA: Diagnosis not present

## 2020-11-15 DIAGNOSIS — I509 Heart failure, unspecified: Secondary | ICD-10-CM | POA: Diagnosis not present

## 2020-11-15 DIAGNOSIS — K219 Gastro-esophageal reflux disease without esophagitis: Secondary | ICD-10-CM | POA: Diagnosis not present

## 2020-11-15 DIAGNOSIS — I4891 Unspecified atrial fibrillation: Secondary | ICD-10-CM | POA: Diagnosis not present

## 2020-11-15 DIAGNOSIS — S2242XD Multiple fractures of ribs, left side, subsequent encounter for fracture with routine healing: Secondary | ICD-10-CM | POA: Diagnosis not present

## 2020-11-15 DIAGNOSIS — Z9181 History of falling: Secondary | ICD-10-CM | POA: Diagnosis not present

## 2020-11-15 DIAGNOSIS — I110 Hypertensive heart disease with heart failure: Secondary | ICD-10-CM | POA: Diagnosis not present

## 2020-11-18 DIAGNOSIS — S2249XA Multiple fractures of ribs, unspecified side, initial encounter for closed fracture: Secondary | ICD-10-CM | POA: Diagnosis not present

## 2020-11-30 DIAGNOSIS — I10 Essential (primary) hypertension: Secondary | ICD-10-CM | POA: Diagnosis not present

## 2020-11-30 DIAGNOSIS — J449 Chronic obstructive pulmonary disease, unspecified: Secondary | ICD-10-CM | POA: Diagnosis not present

## 2020-11-30 DIAGNOSIS — Z85118 Personal history of other malignant neoplasm of bronchus and lung: Secondary | ICD-10-CM | POA: Diagnosis not present

## 2020-11-30 DIAGNOSIS — Z0000 Encounter for general adult medical examination without abnormal findings: Secondary | ICD-10-CM | POA: Diagnosis not present

## 2020-11-30 DIAGNOSIS — Z23 Encounter for immunization: Secondary | ICD-10-CM | POA: Diagnosis not present

## 2020-11-30 DIAGNOSIS — J45909 Unspecified asthma, uncomplicated: Secondary | ICD-10-CM | POA: Diagnosis not present

## 2020-11-30 DIAGNOSIS — Z1389 Encounter for screening for other disorder: Secondary | ICD-10-CM | POA: Diagnosis not present

## 2020-11-30 DIAGNOSIS — Z85820 Personal history of malignant melanoma of skin: Secondary | ICD-10-CM | POA: Diagnosis not present

## 2020-11-30 DIAGNOSIS — R609 Edema, unspecified: Secondary | ICD-10-CM | POA: Diagnosis not present

## 2020-11-30 DIAGNOSIS — I700 Atherosclerosis of aorta: Secondary | ICD-10-CM | POA: Diagnosis not present

## 2020-11-30 DIAGNOSIS — I4891 Unspecified atrial fibrillation: Secondary | ICD-10-CM | POA: Diagnosis not present

## 2020-12-06 DIAGNOSIS — R0600 Dyspnea, unspecified: Secondary | ICD-10-CM | POA: Diagnosis not present

## 2020-12-06 DIAGNOSIS — R059 Cough, unspecified: Secondary | ICD-10-CM | POA: Diagnosis not present

## 2020-12-06 DIAGNOSIS — Z87891 Personal history of nicotine dependence: Secondary | ICD-10-CM | POA: Diagnosis not present

## 2020-12-06 DIAGNOSIS — Z79899 Other long term (current) drug therapy: Secondary | ICD-10-CM | POA: Diagnosis not present

## 2020-12-06 DIAGNOSIS — J449 Chronic obstructive pulmonary disease, unspecified: Secondary | ICD-10-CM | POA: Diagnosis not present

## 2020-12-06 DIAGNOSIS — Z85118 Personal history of other malignant neoplasm of bronchus and lung: Secondary | ICD-10-CM | POA: Diagnosis not present

## 2020-12-13 ENCOUNTER — Other Ambulatory Visit: Payer: Self-pay | Admitting: Interventional Cardiology

## 2020-12-18 DIAGNOSIS — S2249XA Multiple fractures of ribs, unspecified side, initial encounter for closed fracture: Secondary | ICD-10-CM | POA: Diagnosis not present

## 2020-12-19 NOTE — Progress Notes (Signed)
Cardiology Office Note:    Date:  12/21/2020   ID:  [name], [name] [birth year], MRN [id number]  PCP:  [name] Arabian, MD  Cardiologist:  [name] Grooms, MD   Referring MD: [name] Arabian, MD   Chief Complaint  Patient presents with   Atrial Fibrillation   Follow-up    Eliquis therapy    History of Present Illness:    [name] [name] is a 80 y.o. male with a hx of PAF - on Flecainide, chronic anticoagulation with Eliquis and on chronic Tambocor therapy, CAD per cath in 2017 with non obstructive disease noted, OSA, HTN, and CKD. 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  Recent fall with injury on Eliquis.   He is doing okay.  He does have a lot of difficulty with his breathing.  He is seeing a [facility]monologist [facility].  It is Dr. Jonell [name].  Pulmonary rehab has been recommended.  Occupational exposure has included cotton dust, car pain, prior smoking history.  He has not had racing or palpitations relative to heart rhythm.  He has not had syncope.  Orthostatic dizziness is not improved on lower dose Tenormin.  Past Medical History:  Diagnosis Date   A-fib [facility])    Angina    chest pain- cardiac cath. followed in 2010, record available ,  told then that he should f/u /w [name] [name]     Arthritis    R knee, back     Asthma    uses  singulair   Cancer [facility])    prostate, melanoma- 2010, excision     CHF (congestive heart failure) ([place])    Chronic kidney disease    prostate cancer - surg. removal- 1996   Colon polyps    Diverticulitis    Dysrhythmia    palpitations, followed by [name] [name], seen in prep for surgery on 03/27/2011    GERD (gastroesophageal reflux disease)    Hepatitis    jaundice- many yrs. ago   Hiatal hernia    Hypertension    Melanoma ([place])    Face   OSA (obstructive sleep apnea) 04/11/2015   Mild to moderate OSA with AHI 13.3/hr overall and AHI 36.8/hr during REM sleep.  Oxygen saturations were as low  as 86% during respiratory events.   Pneumonia    hosp. 20 yrs. ago   Prostate cancer ([place]) 1995   Sleep apnea    study done, 10 yrs. ago, told that he needed  CPAP but  never used     Past Surgical History:  Procedure Laterality Date   CARDIAC CATHETERIZATION     2010   CARDIAC CATHETERIZATION N/A 10/22/2015   Procedure: Left Heart Cath and Coronary Angiography;  Surgeon: Nelva Bush, MD;  Location: [place] CV LAB;  Service: Cardiovascular;  Laterality: N/A;   CARDIAC CATHETERIZATION     FRACTURE SURGERY     L wrist, hardware- 1982   JOINT REPLACEMENT     L knee, 2009   LEFT HEART CATHETERIZATION WITH CORONARY ANGIOGRAM N/A 09/08/2011   Procedure: LEFT HEART CATHETERIZATION WITH CORONARY ANGIOGRAM;  Surgeon: [name] Grooms, MD;  Location: [facility] CATH LAB;  Service: Cardiovascular;  Laterality: N/A;   LUNG CANCER SURGERY     PROSTATECTOMY     for ca   TONSILLECTOMY     as child   TOTAL KNEE ARTHROPLASTY  04/22/2011   Procedure: TOTAL KNEE ARTHROPLASTY;  Surgeon: Garald Balding, MD;  Location:  [place] OR;  Service: Orthopedics;  Laterality: Right;    Current Medications: Current Meds  Medication Sig   acetaminophen (TYLENOL) 500 MG tablet Take 2 tablets (1,000 mg total) by mouth every 8 (eight) hours as needed.   albuterol (VENTOLIN HFA) 108 (90 Base) MCG/ACT inhaler Inhale 1-2 puffs into the lungs every 6 (six) hours as needed for wheezing or shortness of breath.   apixaban (ELIQUIS) 5 MG TABS tablet Take 1 tablet (5 mg total) by mouth 2 (two) times daily.   benzonatate (TESSALON) 100 MG capsule Take 200 mg by mouth as needed.   budesonide-formoterol (SYMBICORT) 160-4.5 MCG/ACT inhaler Inhale 2 puffs into the lungs 2 (two) times daily.   Calcium Carbonate-Vitamin D 600-200 MG-UNIT TABS Take 1 tablet by mouth 2 (two) times daily.   diltiazem (TIAZAC) 120 MG 24 hr capsule Take 120 mg by mouth daily.   docusate sodium (COLACE) 100 MG capsule Take 1 capsule (100 mg total) by mouth  2 (two) times daily as needed for mild constipation.   flecainide (TAMBOCOR) 50 MG tablet Take 1 tablet (50 mg total) by mouth 2 (two) times daily. Please keep upcoming appt. With Dr. Tamala Julian in order to receive future refills.   furosemide (LASIX) 20 MG tablet Take 20mg  daily with extra 20mg  (total of 40mg ) every other day   montelukast (SINGULAIR) 10 MG tablet Take 10 mg by mouth daily.   Multiple Vitamin (MULTIVITAMIN WITH MINERALS) TABS tablet Take 1 tablet by mouth daily.   Multiple Vitamins tablet Take 1 tablet by mouth daily.   neomycin-polymyxin b-dexamethasone (MAXITROL) 3.5-10000-0.1 OINT SMARTSIG:In Eye(s)   omeprazole (PRILOSEC) 40 MG capsule Take 40 mg by mouth at bedtime.    polyvinyl alcohol (LIQUIFILM TEARS) 1.4 % ophthalmic solution Place 1 drop into both eyes as needed for dry eyes.   polyvinyl alcohol (LIQUIFILM TEARS) 1.4 % ophthalmic solution Apply 1 drop to eye daily.   polyvinyl alcohol (LIQUIFILM TEARS) 1.4 % ophthalmic solution Place 1 drop into both eyes as needed for dry eyes.   potassium chloride SA (KLOR-CON) 20 MEQ tablet Take 1 tablet (20 mEq total) by mouth daily.   telmisartan (MICARDIS) 80 MG tablet Take 80 mg by mouth daily.   traMADol (ULTRAM) 50 MG tablet Take 1-2 tablets (50-100 mg total) by mouth every 6 (six) hours as needed for severe pain.   vitamin E 1000 UNIT capsule Take 1,000 Units by mouth daily.     Allergies:   Aminophylline, Carvedilol, Tiotropium, Zolpidem, Carvedilol, Lisinopril, Tenormin [atenolol], Zolpidem tartrate, Aminophylline, Atenolol, and Lisinopril   Social History   Socioeconomic History   Marital status: Married    Spouse name: Not on file   Number of children: Not on file   Years of education: Not on file   Highest education level: Not on file  Occupational History   Not on file  Tobacco Use   Smoking status: Former    Packs/day: 1.50    Years: 30.00    Pack years: 45.00    Types: Cigarettes    Quit date: 04/11/1983     Years since quitting: 37.7   Smokeless tobacco: Never  Vaping Use   Vaping Use: Never used  Substance and Sexual Activity   Alcohol use: No   Drug use: No   Sexual activity: Not on file  Other Topics Concern   Not on file  Social History Narrative   ** Merged History Encounter **       Social Determinants of Health  Financial Resource Strain: Not on file  Food Insecurity: Not on file  Transportation Needs: Not on file  Physical Activity: Not on file  Stress: Not on file  Social Connections: Not on file     Family History: The patient's family history includes Alzheimer's disease in his father; Breast cancer in his mother; Crohn's disease in his brother; Heart disease in his brother; Hypertension in his brother. There is no history of Anesthesia problems, Hypotension, Malignant hyperthermia, or Pseudochol deficiency.  ROS:   Please see the history of present illness.    He has had pulmonary radiation therapy for lung cancer.  He has chronic lower extremity swelling that is somewhat better now than previous.  He was hospitalized at [facility] after he fell off of his porch and fractured 5 ribs.  He was admitted to the hospital in August for respiratory failure.  All other systems reviewed and are negative.  EKGs/Labs/Other Studies Reviewed:    The following studies were reviewed today:  ECHOCARDIOGRAPHY 10/08/2020: IMPRESSIONS     1. Left ventricular ejection fraction, by estimation, is 55 to 60%. The  left ventricle has normal function. The left ventricle has no regional  wall motion abnormalities. The left ventricular internal cavity size was  moderately dilated. Left ventricular  diastolic parameters are indeterminate.   2. Right ventricular systolic function is mildly reduced. The right  ventricular size is normal. There is moderately elevated pulmonary artery  systolic pressure.   3. The mitral valve is normal in structure. Trivial mitral valve  regurgitation.   4. The  aortic valve is tricuspid. Aortic valve regurgitation is not  visualized. Mild aortic valve sclerosis is present, with no evidence of  aortic valve stenosis.   Comparison(s): The left ventricular function is unchanged.   EKG:  EKG not repeated  Recent Labs: 08/12/2020: B Natriuretic Peptide 62.4 09/20/2020: TSH 0.919 10/14/2020: ALT 11; BUN 15; Creatinine, Ser 1.10; Hemoglobin 13.3; Platelets 145; Potassium 4.0; Sodium 144  Recent Lipid Panel    Component Value Date/Time   CHOL 159 06/14/2019 1122   TRIG 89 06/14/2019 1122   HDL 40 06/14/2019 1122   CHOLHDL 4.0 06/14/2019 1122   CHOLHDL 3.5 02/28/2017 0218   VLDL 10 02/28/2017 0218   LDLCALC 102 (H) 06/14/2019 1122    Physical Exam:    VS:  BP (!) 148/68   Pulse 67   Ht 5\' 7"  (1.702 m)   Wt 260 lb 9.6 oz (118.2 kg)   SpO2 96%   BMI 40.82 kg/m     Wt Readings from Last 3 Encounters:  12/21/20 260 lb 9.6 oz (118.2 kg)  10/14/20 247 lb (112 kg)  09/20/20 249 lb (112.9 kg)     GEN: Obese. No acute distress HEENT: Normal NECK: No JVD. LYMPHATICS: No lymphadenopathy CARDIAC: No murmur. RRR S4 gallop, or edema. VASCULAR:  Normal Pulses. No bruits. RESPIRATORY:  Clear to auscultation without rales, wheezing or rhonchi  ABDOMEN: Soft, non-tender, non-distended, No pulsatile mass, MUSCULOSKELETAL: No deformity  SKIN: Warm and dry NEUROLOGIC:  Alert and oriented x 3 PSYCHIATRIC:  Normal affect   ASSESSMENT:    1. PAF (paroxysmal atrial fibrillation) ([place])   2. Chronic anticoagulation   3. Essential hypertension   4. OSA (obstructive sleep apnea)   5. Ischemic heart disease    PLAN:    In order of problems listed above:  Current excellent rhythm control on Tambocor 50 mg twice daily. No bleeding complications on apixaban 5 mg twice daily. Blood pressure  is adequate at his age.  He is currently 44.  Target 140/80 mmHg. He is compliant with CPAP   Clinical follow-up in 6 to 8 months or if acute cardiac issues are  recurrent A. fib.  Pression stockings.   Medication Adjustments/Labs and Tests Ordered: Current medicines are reviewed at length with the patient today.  Concerns regarding medicines are outlined above.  No orders of the defined types were placed in this encounter.  No orders of the defined types were placed in this encounter.   There are no Patient Instructions on file for this visit.   Signed, [name] Grooms, MD  12/21/2020 1:41 PM    [facility]

## 2020-12-21 ENCOUNTER — Other Ambulatory Visit: Payer: Self-pay

## 2020-12-21 ENCOUNTER — Encounter: Payer: Self-pay | Admitting: Interventional Cardiology

## 2020-12-21 ENCOUNTER — Ambulatory Visit: Payer: Medicare Other | Admitting: Interventional Cardiology

## 2020-12-21 VITALS — BP 148/68 | HR 67 | Ht 67.0 in | Wt 260.6 lb

## 2020-12-21 DIAGNOSIS — G4733 Obstructive sleep apnea (adult) (pediatric): Secondary | ICD-10-CM

## 2020-12-21 DIAGNOSIS — I10 Essential (primary) hypertension: Secondary | ICD-10-CM

## 2020-12-21 DIAGNOSIS — I259 Chronic ischemic heart disease, unspecified: Secondary | ICD-10-CM | POA: Diagnosis not present

## 2020-12-21 DIAGNOSIS — Z7901 Long term (current) use of anticoagulants: Secondary | ICD-10-CM

## 2020-12-21 DIAGNOSIS — I480 Paroxysmal atrial fibrillation: Secondary | ICD-10-CM

## 2020-12-21 MED ORDER — FLECAINIDE ACETATE 50 MG PO TABS: 50.0000 mg | ORAL_TABLET | Freq: Two times a day (BID) | ORAL | 3 refills | Status: DC

## 2020-12-21 NOTE — Patient Instructions (Signed)

## 2020-12-31 DIAGNOSIS — L570 Actinic keratosis: Secondary | ICD-10-CM | POA: Diagnosis not present

## 2020-12-31 DIAGNOSIS — L578 Other skin changes due to chronic exposure to nonionizing radiation: Secondary | ICD-10-CM | POA: Diagnosis not present

## 2020-12-31 DIAGNOSIS — C44329 Squamous cell carcinoma of skin of other parts of face: Secondary | ICD-10-CM | POA: Diagnosis not present

## 2020-12-31 DIAGNOSIS — L821 Other seborrheic keratosis: Secondary | ICD-10-CM | POA: Diagnosis not present

## 2020-12-31 DIAGNOSIS — C4441 Basal cell carcinoma of skin of scalp and neck: Secondary | ICD-10-CM | POA: Diagnosis not present

## 2021-01-18 ENCOUNTER — Telehealth: Payer: Self-pay | Admitting: *Deleted

## 2021-01-18 DIAGNOSIS — S2249XA Multiple fractures of ribs, unspecified side, initial encounter for closed fracture: Secondary | ICD-10-CM | POA: Diagnosis not present

## 2021-01-18 MED ORDER — APIXABAN 5 MG PO TABS: 5.0000 mg | ORAL_TABLET | Freq: Two times a day (BID) | ORAL | 1 refills | Status: DC

## 2021-01-18 NOTE — Telephone Encounter (Signed)
Eliquis 5mg  paper refill request received. Patient is 79 years old, weight-118.2kg, Crea-1.10 on 10/14/2020, Diagnosis-Afib, and last seen by Dr. Tamala Julian on 12/21/2020. Dose is appropriate based on dosing criteria. Will send in refill to requested pharmacy.

## 2021-01-21 DIAGNOSIS — J449 Chronic obstructive pulmonary disease, unspecified: Secondary | ICD-10-CM | POA: Diagnosis not present

## 2021-01-21 DIAGNOSIS — I10 Essential (primary) hypertension: Secondary | ICD-10-CM | POA: Diagnosis not present

## 2021-01-21 DIAGNOSIS — J45909 Unspecified asthma, uncomplicated: Secondary | ICD-10-CM | POA: Diagnosis not present

## 2021-01-21 DIAGNOSIS — I4891 Unspecified atrial fibrillation: Secondary | ICD-10-CM | POA: Diagnosis not present

## 2021-01-21 DIAGNOSIS — K219 Gastro-esophageal reflux disease without esophagitis: Secondary | ICD-10-CM | POA: Diagnosis not present

## 2021-01-28 ENCOUNTER — Other Ambulatory Visit (HOSPITAL_COMMUNITY): Payer: Self-pay

## 2021-02-04 DIAGNOSIS — H43813 Vitreous degeneration, bilateral: Secondary | ICD-10-CM | POA: Diagnosis not present

## 2021-02-04 DIAGNOSIS — H0100A Unspecified blepharitis right eye, upper and lower eyelids: Secondary | ICD-10-CM | POA: Diagnosis not present

## 2021-02-04 DIAGNOSIS — H52203 Unspecified astigmatism, bilateral: Secondary | ICD-10-CM | POA: Diagnosis not present

## 2021-02-04 DIAGNOSIS — H26493 Other secondary cataract, bilateral: Secondary | ICD-10-CM | POA: Diagnosis not present

## 2021-02-12 DIAGNOSIS — R911 Solitary pulmonary nodule: Secondary | ICD-10-CM | POA: Diagnosis not present

## 2021-02-12 DIAGNOSIS — S2232XA Fracture of one rib, left side, initial encounter for closed fracture: Secondary | ICD-10-CM | POA: Diagnosis not present

## 2021-02-12 DIAGNOSIS — I700 Atherosclerosis of aorta: Secondary | ICD-10-CM | POA: Diagnosis not present

## 2021-02-12 DIAGNOSIS — C3490 Malignant neoplasm of unspecified part of unspecified bronchus or lung: Secondary | ICD-10-CM | POA: Diagnosis not present

## 2021-02-12 DIAGNOSIS — C3411 Malignant neoplasm of upper lobe, right bronchus or lung: Secondary | ICD-10-CM | POA: Diagnosis not present

## 2021-02-17 DIAGNOSIS — S2249XA Multiple fractures of ribs, unspecified side, initial encounter for closed fracture: Secondary | ICD-10-CM | POA: Diagnosis not present

## 2021-02-19 ENCOUNTER — Other Ambulatory Visit (HOSPITAL_COMMUNITY): Payer: Self-pay

## 2021-02-25 DIAGNOSIS — R911 Solitary pulmonary nodule: Secondary | ICD-10-CM | POA: Diagnosis not present

## 2021-02-25 DIAGNOSIS — Z85118 Personal history of other malignant neoplasm of bronchus and lung: Secondary | ICD-10-CM | POA: Diagnosis not present

## 2021-02-25 DIAGNOSIS — Z85820 Personal history of malignant melanoma of skin: Secondary | ICD-10-CM | POA: Diagnosis not present

## 2021-02-25 DIAGNOSIS — I77810 Thoracic aortic ectasia: Secondary | ICD-10-CM | POA: Diagnosis not present

## 2021-02-25 DIAGNOSIS — R918 Other nonspecific abnormal finding of lung field: Secondary | ICD-10-CM | POA: Diagnosis not present

## 2021-02-25 DIAGNOSIS — I700 Atherosclerosis of aorta: Secondary | ICD-10-CM | POA: Diagnosis not present

## 2021-02-25 DIAGNOSIS — Z85821 Personal history of Merkel cell carcinoma: Secondary | ICD-10-CM | POA: Diagnosis not present

## 2021-02-25 DIAGNOSIS — J439 Emphysema, unspecified: Secondary | ICD-10-CM | POA: Diagnosis not present

## 2021-02-25 DIAGNOSIS — Z87891 Personal history of nicotine dependence: Secondary | ICD-10-CM | POA: Diagnosis not present

## 2021-02-25 DIAGNOSIS — I2510 Atherosclerotic heart disease of native coronary artery without angina pectoris: Secondary | ICD-10-CM | POA: Diagnosis not present

## 2021-02-27 DIAGNOSIS — C3411 Malignant neoplasm of upper lobe, right bronchus or lung: Secondary | ICD-10-CM | POA: Diagnosis not present

## 2021-03-05 DIAGNOSIS — J9801 Acute bronchospasm: Secondary | ICD-10-CM | POA: Diagnosis not present

## 2021-03-05 DIAGNOSIS — J209 Acute bronchitis, unspecified: Secondary | ICD-10-CM | POA: Diagnosis not present

## 2021-03-20 DIAGNOSIS — Z9889 Other specified postprocedural states: Secondary | ICD-10-CM | POA: Diagnosis not present

## 2021-03-20 DIAGNOSIS — C3411 Malignant neoplasm of upper lobe, right bronchus or lung: Secondary | ICD-10-CM | POA: Diagnosis not present

## 2021-03-20 DIAGNOSIS — Z85821 Personal history of Merkel cell carcinoma: Secondary | ICD-10-CM | POA: Diagnosis not present

## 2021-03-20 DIAGNOSIS — Z85118 Personal history of other malignant neoplasm of bronchus and lung: Secondary | ICD-10-CM | POA: Diagnosis not present

## 2021-03-20 DIAGNOSIS — Z08 Encounter for follow-up examination after completed treatment for malignant neoplasm: Secondary | ICD-10-CM | POA: Diagnosis not present

## 2021-03-20 DIAGNOSIS — C4A30 Merkel cell carcinoma of unspecified part of face: Secondary | ICD-10-CM | POA: Diagnosis not present

## 2021-03-20 DIAGNOSIS — Z923 Personal history of irradiation: Secondary | ICD-10-CM | POA: Diagnosis not present

## 2021-03-20 DIAGNOSIS — Z85820 Personal history of malignant melanoma of skin: Secondary | ICD-10-CM | POA: Diagnosis not present

## 2021-03-20 DIAGNOSIS — S2249XA Multiple fractures of ribs, unspecified side, initial encounter for closed fracture: Secondary | ICD-10-CM | POA: Diagnosis not present

## 2021-03-28 DIAGNOSIS — Z87891 Personal history of nicotine dependence: Secondary | ICD-10-CM | POA: Diagnosis not present

## 2021-03-28 DIAGNOSIS — C3411 Malignant neoplasm of upper lobe, right bronchus or lung: Secondary | ICD-10-CM | POA: Diagnosis not present

## 2021-03-28 DIAGNOSIS — R0609 Other forms of dyspnea: Secondary | ICD-10-CM | POA: Diagnosis not present

## 2021-03-29 DIAGNOSIS — J45909 Unspecified asthma, uncomplicated: Secondary | ICD-10-CM | POA: Diagnosis not present

## 2021-04-01 DIAGNOSIS — J449 Chronic obstructive pulmonary disease, unspecified: Secondary | ICD-10-CM | POA: Diagnosis not present

## 2021-04-01 DIAGNOSIS — J0100 Acute maxillary sinusitis, unspecified: Secondary | ICD-10-CM | POA: Diagnosis not present

## 2021-04-01 DIAGNOSIS — R059 Cough, unspecified: Secondary | ICD-10-CM | POA: Diagnosis not present

## 2021-04-01 DIAGNOSIS — R0602 Shortness of breath: Secondary | ICD-10-CM | POA: Diagnosis not present

## 2021-04-01 DIAGNOSIS — J209 Acute bronchitis, unspecified: Secondary | ICD-10-CM | POA: Diagnosis not present

## 2021-04-16 ENCOUNTER — Other Ambulatory Visit: Payer: Self-pay

## 2021-04-16 MED ORDER — APIXABAN 5 MG PO TABS: 5.0000 mg | ORAL_TABLET | Freq: Two times a day (BID) | ORAL | 1 refills | Status: DC

## 2021-04-16 NOTE — Telephone Encounter (Signed)
Pt calling requesting a refill on Eliquis, be resent to [facility] mail order pharmacy. Please address

## 2021-04-16 NOTE — Telephone Encounter (Signed)
Pt last saw Dr Tamala Julian 12/21/20, last labs 03/20/21 Creat 1.22, age 81, weight 118.2, based on specified criteria pt is on appropriate dosage of Eliquis 5mg  BID for afb.  Will refill rx.

## 2021-04-20 DIAGNOSIS — S2249XA Multiple fractures of ribs, unspecified side, initial encounter for closed fracture: Secondary | ICD-10-CM | POA: Diagnosis not present

## 2021-04-22 DIAGNOSIS — C44319 Basal cell carcinoma of skin of other parts of face: Secondary | ICD-10-CM | POA: Diagnosis not present

## 2021-04-22 DIAGNOSIS — L821 Other seborrheic keratosis: Secondary | ICD-10-CM | POA: Diagnosis not present

## 2021-04-22 DIAGNOSIS — L578 Other skin changes due to chronic exposure to nonionizing radiation: Secondary | ICD-10-CM | POA: Diagnosis not present

## 2021-04-22 DIAGNOSIS — L570 Actinic keratosis: Secondary | ICD-10-CM | POA: Diagnosis not present

## 2021-04-22 DIAGNOSIS — C44321 Squamous cell carcinoma of skin of nose: Secondary | ICD-10-CM | POA: Diagnosis not present

## 2021-05-15 DIAGNOSIS — C44311 Basal cell carcinoma of skin of nose: Secondary | ICD-10-CM | POA: Diagnosis not present

## 2021-05-18 DIAGNOSIS — S2249XA Multiple fractures of ribs, unspecified side, initial encounter for closed fracture: Secondary | ICD-10-CM | POA: Diagnosis not present

## 2021-05-21 DIAGNOSIS — I517 Cardiomegaly: Secondary | ICD-10-CM | POA: Diagnosis not present

## 2021-05-21 DIAGNOSIS — R911 Solitary pulmonary nodule: Secondary | ICD-10-CM | POA: Diagnosis not present

## 2021-06-05 DIAGNOSIS — J45909 Unspecified asthma, uncomplicated: Secondary | ICD-10-CM | POA: Diagnosis not present

## 2021-06-05 DIAGNOSIS — I4891 Unspecified atrial fibrillation: Secondary | ICD-10-CM | POA: Diagnosis not present

## 2021-06-05 DIAGNOSIS — G4733 Obstructive sleep apnea (adult) (pediatric): Secondary | ICD-10-CM | POA: Diagnosis not present

## 2021-06-05 DIAGNOSIS — R609 Edema, unspecified: Secondary | ICD-10-CM | POA: Diagnosis not present

## 2021-06-05 DIAGNOSIS — Z85820 Personal history of malignant melanoma of skin: Secondary | ICD-10-CM | POA: Diagnosis not present

## 2021-06-05 DIAGNOSIS — I700 Atherosclerosis of aorta: Secondary | ICD-10-CM | POA: Diagnosis not present

## 2021-06-05 DIAGNOSIS — I10 Essential (primary) hypertension: Secondary | ICD-10-CM | POA: Diagnosis not present

## 2021-06-05 DIAGNOSIS — Z85118 Personal history of other malignant neoplasm of bronchus and lung: Secondary | ICD-10-CM | POA: Diagnosis not present

## 2021-06-05 DIAGNOSIS — D6869 Other thrombophilia: Secondary | ICD-10-CM | POA: Diagnosis not present

## 2021-06-05 DIAGNOSIS — J449 Chronic obstructive pulmonary disease, unspecified: Secondary | ICD-10-CM | POA: Diagnosis not present

## 2021-06-05 DIAGNOSIS — K219 Gastro-esophageal reflux disease without esophagitis: Secondary | ICD-10-CM | POA: Diagnosis not present

## 2021-06-18 DIAGNOSIS — S2249XA Multiple fractures of ribs, unspecified side, initial encounter for closed fracture: Secondary | ICD-10-CM | POA: Diagnosis not present

## 2021-06-24 DIAGNOSIS — H0100A Unspecified blepharitis right eye, upper and lower eyelids: Secondary | ICD-10-CM | POA: Diagnosis not present

## 2021-06-24 DIAGNOSIS — H10412 Chronic giant papillary conjunctivitis, left eye: Secondary | ICD-10-CM | POA: Diagnosis not present

## 2021-06-24 DIAGNOSIS — S0502XA Injury of conjunctiva and corneal abrasion without foreign body, left eye, initial encounter: Secondary | ICD-10-CM | POA: Diagnosis not present

## 2021-06-25 DIAGNOSIS — L578 Other skin changes due to chronic exposure to nonionizing radiation: Secondary | ICD-10-CM | POA: Diagnosis not present

## 2021-06-25 DIAGNOSIS — L570 Actinic keratosis: Secondary | ICD-10-CM | POA: Diagnosis not present

## 2021-06-25 DIAGNOSIS — C44529 Squamous cell carcinoma of skin of other part of trunk: Secondary | ICD-10-CM | POA: Diagnosis not present

## 2021-06-25 DIAGNOSIS — C44311 Basal cell carcinoma of skin of nose: Secondary | ICD-10-CM | POA: Diagnosis not present

## 2021-06-25 DIAGNOSIS — L821 Other seborrheic keratosis: Secondary | ICD-10-CM | POA: Diagnosis not present

## 2021-07-02 DIAGNOSIS — C44529 Squamous cell carcinoma of skin of other part of trunk: Secondary | ICD-10-CM | POA: Diagnosis not present

## 2021-07-10 DIAGNOSIS — C3411 Malignant neoplasm of upper lobe, right bronchus or lung: Secondary | ICD-10-CM | POA: Diagnosis not present

## 2021-07-18 DIAGNOSIS — S2249XA Multiple fractures of ribs, unspecified side, initial encounter for closed fracture: Secondary | ICD-10-CM | POA: Diagnosis not present

## 2021-07-19 DIAGNOSIS — I4891 Unspecified atrial fibrillation: Secondary | ICD-10-CM | POA: Diagnosis not present

## 2021-07-19 DIAGNOSIS — J45909 Unspecified asthma, uncomplicated: Secondary | ICD-10-CM | POA: Diagnosis not present

## 2021-07-19 DIAGNOSIS — K219 Gastro-esophageal reflux disease without esophagitis: Secondary | ICD-10-CM | POA: Diagnosis not present

## 2021-07-19 DIAGNOSIS — I10 Essential (primary) hypertension: Secondary | ICD-10-CM | POA: Diagnosis not present

## 2021-07-19 DIAGNOSIS — J449 Chronic obstructive pulmonary disease, unspecified: Secondary | ICD-10-CM | POA: Diagnosis not present

## 2021-07-21 ENCOUNTER — Emergency Department (HOSPITAL_COMMUNITY): Payer: Medicare Other

## 2021-07-21 ENCOUNTER — Encounter: Payer: Self-pay | Admitting: Urgent Care

## 2021-07-21 ENCOUNTER — Other Ambulatory Visit: Payer: Self-pay

## 2021-07-21 ENCOUNTER — Encounter (HOSPITAL_COMMUNITY): Payer: Self-pay

## 2021-07-21 ENCOUNTER — Ambulatory Visit
Admission: EM | Admit: 2021-07-21 | Discharge: 2021-07-21 | Disposition: A | Discharge status: Other Acute Inpatient Hospital | Payer: Medicare Other | Attending: Urgent Care | Admitting: Urgent Care

## 2021-07-21 ENCOUNTER — Inpatient Hospital Stay (HOSPITAL_COMMUNITY)
Admission: EM | Admit: 2021-07-21 | Discharge: 2021-07-24 | DRG: 418 | Disposition: A | Discharge status: Home / Self Care | Payer: Medicare Other | Source: Ambulatory Visit | Attending: Internal Medicine | Admitting: Internal Medicine

## 2021-07-21 DIAGNOSIS — Z6839 Body mass index (BMI) 39.0-39.9, adult: Secondary | ICD-10-CM | POA: Diagnosis not present

## 2021-07-21 DIAGNOSIS — K219 Gastro-esophageal reflux disease without esophagitis: Secondary | ICD-10-CM | POA: Diagnosis not present

## 2021-07-21 DIAGNOSIS — I110 Hypertensive heart disease with heart failure: Secondary | ICD-10-CM | POA: Diagnosis not present

## 2021-07-21 DIAGNOSIS — G4733 Obstructive sleep apnea (adult) (pediatric): Secondary | ICD-10-CM | POA: Diagnosis present

## 2021-07-21 DIAGNOSIS — Z7901 Long term (current) use of anticoagulants: Secondary | ICD-10-CM

## 2021-07-21 DIAGNOSIS — Z85118 Personal history of other malignant neoplasm of bronchus and lung: Secondary | ICD-10-CM | POA: Diagnosis not present

## 2021-07-21 DIAGNOSIS — Z885 Allergy status to narcotic agent status: Secondary | ICD-10-CM

## 2021-07-21 DIAGNOSIS — Z96653 Presence of artificial knee joint, bilateral: Secondary | ICD-10-CM | POA: Diagnosis present

## 2021-07-21 DIAGNOSIS — I5032 Chronic diastolic (congestive) heart failure: Secondary | ICD-10-CM | POA: Diagnosis not present

## 2021-07-21 DIAGNOSIS — I272 Pulmonary hypertension, unspecified: Secondary | ICD-10-CM | POA: Diagnosis present

## 2021-07-21 DIAGNOSIS — Z85820 Personal history of malignant melanoma of skin: Secondary | ICD-10-CM

## 2021-07-21 DIAGNOSIS — K810 Acute cholecystitis: Secondary | ICD-10-CM | POA: Diagnosis not present

## 2021-07-21 DIAGNOSIS — K5730 Diverticulosis of large intestine without perforation or abscess without bleeding: Secondary | ICD-10-CM | POA: Diagnosis not present

## 2021-07-21 DIAGNOSIS — D6959 Other secondary thrombocytopenia: Secondary | ICD-10-CM | POA: Diagnosis present

## 2021-07-21 DIAGNOSIS — J8410 Pulmonary fibrosis, unspecified: Secondary | ICD-10-CM | POA: Diagnosis not present

## 2021-07-21 DIAGNOSIS — Z86010 Personal history of colonic polyps: Secondary | ICD-10-CM | POA: Diagnosis not present

## 2021-07-21 DIAGNOSIS — K8000 Calculus of gallbladder with acute cholecystitis without obstruction: Secondary | ICD-10-CM | POA: Diagnosis not present

## 2021-07-21 DIAGNOSIS — R112 Nausea with vomiting, unspecified: Secondary | ICD-10-CM | POA: Diagnosis not present

## 2021-07-21 DIAGNOSIS — R109 Unspecified abdominal pain: Secondary | ICD-10-CM | POA: Diagnosis not present

## 2021-07-21 DIAGNOSIS — I2510 Atherosclerotic heart disease of native coronary artery without angina pectoris: Secondary | ICD-10-CM | POA: Diagnosis not present

## 2021-07-21 DIAGNOSIS — J449 Chronic obstructive pulmonary disease, unspecified: Secondary | ICD-10-CM | POA: Diagnosis not present

## 2021-07-21 DIAGNOSIS — Z8249 Family history of ischemic heart disease and other diseases of the circulatory system: Secondary | ICD-10-CM | POA: Diagnosis not present

## 2021-07-21 DIAGNOSIS — I130 Hypertensive heart and chronic kidney disease with heart failure and stage 1 through stage 4 chronic kidney disease, or unspecified chronic kidney disease: Secondary | ICD-10-CM | POA: Diagnosis present

## 2021-07-21 DIAGNOSIS — I480 Paroxysmal atrial fibrillation: Secondary | ICD-10-CM | POA: Diagnosis present

## 2021-07-21 DIAGNOSIS — R110 Nausea: Secondary | ICD-10-CM

## 2021-07-21 DIAGNOSIS — Z8546 Personal history of malignant neoplasm of prostate: Secondary | ICD-10-CM

## 2021-07-21 DIAGNOSIS — I509 Heart failure, unspecified: Secondary | ICD-10-CM | POA: Diagnosis not present

## 2021-07-21 DIAGNOSIS — K8010 Calculus of gallbladder with chronic cholecystitis without obstruction: Secondary | ICD-10-CM | POA: Diagnosis not present

## 2021-07-21 DIAGNOSIS — K819 Cholecystitis, unspecified: Principal | ICD-10-CM

## 2021-07-21 DIAGNOSIS — J984 Other disorders of lung: Secondary | ICD-10-CM | POA: Diagnosis not present

## 2021-07-21 DIAGNOSIS — Z7951 Long term (current) use of inhaled steroids: Secondary | ICD-10-CM

## 2021-07-21 DIAGNOSIS — G4730 Sleep apnea, unspecified: Secondary | ICD-10-CM | POA: Diagnosis present

## 2021-07-21 DIAGNOSIS — Z9079 Acquired absence of other genital organ(s): Secondary | ICD-10-CM

## 2021-07-21 DIAGNOSIS — I10 Essential (primary) hypertension: Secondary | ICD-10-CM | POA: Diagnosis present

## 2021-07-21 DIAGNOSIS — R1084 Generalized abdominal pain: Secondary | ICD-10-CM

## 2021-07-21 DIAGNOSIS — R9431 Abnormal electrocardiogram [ECG] [EKG]: Secondary | ICD-10-CM | POA: Diagnosis present

## 2021-07-21 DIAGNOSIS — Z87891 Personal history of nicotine dependence: Secondary | ICD-10-CM | POA: Diagnosis not present

## 2021-07-21 DIAGNOSIS — Z820 Family history of epilepsy and other diseases of the nervous system: Secondary | ICD-10-CM | POA: Diagnosis not present

## 2021-07-21 DIAGNOSIS — K82A1 Gangrene of gallbladder in cholecystitis: Secondary | ICD-10-CM | POA: Diagnosis not present

## 2021-07-21 DIAGNOSIS — R0789 Other chest pain: Secondary | ICD-10-CM | POA: Diagnosis not present

## 2021-07-21 DIAGNOSIS — K8020 Calculus of gallbladder without cholecystitis without obstruction: Secondary | ICD-10-CM | POA: Diagnosis not present

## 2021-07-21 DIAGNOSIS — Z803 Family history of malignant neoplasm of breast: Secondary | ICD-10-CM

## 2021-07-21 DIAGNOSIS — N1831 Chronic kidney disease, stage 3a: Secondary | ICD-10-CM | POA: Diagnosis present

## 2021-07-21 DIAGNOSIS — K439 Ventral hernia without obstruction or gangrene: Secondary | ICD-10-CM | POA: Diagnosis not present

## 2021-07-21 DIAGNOSIS — Z743 Need for continuous supervision: Secondary | ICD-10-CM | POA: Diagnosis not present

## 2021-07-21 DIAGNOSIS — N183 Chronic kidney disease, stage 3 unspecified: Secondary | ICD-10-CM | POA: Diagnosis present

## 2021-07-21 DIAGNOSIS — Z79899 Other long term (current) drug therapy: Secondary | ICD-10-CM

## 2021-07-21 DIAGNOSIS — R1013 Epigastric pain: Secondary | ICD-10-CM | POA: Diagnosis not present

## 2021-07-21 DIAGNOSIS — K449 Diaphragmatic hernia without obstruction or gangrene: Secondary | ICD-10-CM | POA: Diagnosis not present

## 2021-07-21 DIAGNOSIS — R1011 Right upper quadrant pain: Secondary | ICD-10-CM | POA: Diagnosis not present

## 2021-07-21 DIAGNOSIS — Z888 Allergy status to other drugs, medicaments and biological substances status: Secondary | ICD-10-CM

## 2021-07-21 DIAGNOSIS — R6889 Other general symptoms and signs: Secondary | ICD-10-CM | POA: Diagnosis not present

## 2021-07-21 LAB — I-STAT CHEM 8, ED
BUN: 16 mg/dL (ref 8–23)
Calcium, Ion: 1.08 mmol/L — ABNORMAL LOW (ref 1.15–1.40)
Chloride: 105 mmol/L (ref 98–111)
Creatinine, Ser: 1.2 mg/dL (ref 0.61–1.24)
Glucose, Bld: 127 mg/dL — ABNORMAL HIGH (ref 70–99)
HCT: 40 % (ref 39.0–52.0)
Hemoglobin: 13.6 g/dL (ref 13.0–17.0)
Potassium: 3.6 mmol/L (ref 3.5–5.1)
Sodium: 141 mmol/L (ref 135–145)
TCO2: 26 mmol/L (ref 22–32)

## 2021-07-21 LAB — COMPREHENSIVE METABOLIC PANEL
ALT: 10 U/L (ref 0–44)
AST: 19 U/L (ref 15–41)
Albumin: 3.8 g/dL (ref 3.5–5.0)
Alkaline Phosphatase: 54 U/L (ref 38–126)
Anion gap: 7 (ref 5–15)
BUN: 14 mg/dL (ref 8–23)
CO2: 26 mmol/L (ref 22–32)
Calcium: 9 mg/dL (ref 8.9–10.3)
Chloride: 109 mmol/L (ref 98–111)
Creatinine, Ser: 1.18 mg/dL (ref 0.61–1.24)
GFR, Estimated: >60 mL/min (ref >60)
Glucose, Bld: 126 mg/dL — ABNORMAL HIGH (ref 70–99)
Potassium: 3.7 mmol/L (ref 3.5–5.1)
Sodium: 142 mmol/L (ref 135–145)
Total Bilirubin: 1.6 mg/dL — ABNORMAL HIGH (ref 0.3–1.2)
Total Protein: 6.3 g/dL — ABNORMAL LOW (ref 6.5–8.1)

## 2021-07-21 LAB — CBC WITH DIFFERENTIAL/PLATELET
Abs Immature Granulocytes: 0.04 10*3/uL (ref 0.00–0.07)
Basophils Absolute: 0 10*3/uL (ref 0.0–0.1)
Basophils Relative: 0 %
Eosinophils Absolute: 0 10*3/uL (ref 0.0–0.5)
Eosinophils Relative: 0 %
HCT: 40.8 % (ref 39.0–52.0)
Hemoglobin: 13.6 g/dL (ref 13.0–17.0)
Immature Granulocytes: 0 %
Lymphocytes Relative: 8 %
Lymphs Abs: 1 10*3/uL (ref 0.7–4.0)
MCH: 30.6 pg (ref 26.0–34.0)
MCHC: 33.3 g/dL (ref 30.0–36.0)
MCV: 91.9 fL (ref 80.0–100.0)
Monocytes Absolute: 0.6 10*3/uL (ref 0.1–1.0)
Monocytes Relative: 5 %
Neutro Abs: 10.6 10*3/uL — ABNORMAL HIGH (ref 1.7–7.7)
Neutrophils Relative %: 87 %
Platelets: 154 10*3/uL (ref 150–400)
RBC: 4.44 MIL/uL (ref 4.22–5.81)
RDW: 13.2 % (ref 11.5–15.5)
WBC: 12.3 10*3/uL — ABNORMAL HIGH (ref 4.0–10.5)
nRBC: 0 % (ref 0.0–0.2)

## 2021-07-21 LAB — URINALYSIS, ROUTINE W REFLEX MICROSCOPIC
Bilirubin Urine: NEGATIVE
Glucose, UA: NEGATIVE mg/dL
Hgb urine dipstick: NEGATIVE
Ketones, ur: NEGATIVE mg/dL
Leukocytes,Ua: NEGATIVE
Nitrite: NEGATIVE
Protein, ur: NEGATIVE mg/dL
Specific Gravity, Urine: 1.041 — ABNORMAL HIGH (ref 1.005–1.030)
pH: 7 (ref 5.0–8.0)

## 2021-07-21 LAB — TROPONIN I (HIGH SENSITIVITY)
Troponin I (High Sensitivity): 10 ng/L (ref <18)
Troponin I (High Sensitivity): 12 ng/L (ref <18)

## 2021-07-21 LAB — PROTIME-INR
INR: 1.3 — ABNORMAL HIGH (ref 0.8–1.2)
Prothrombin Time: 16.1 seconds — ABNORMAL HIGH (ref 11.4–15.2)

## 2021-07-21 LAB — LIPASE, BLOOD: Lipase: 25 U/L (ref 11–51)

## 2021-07-21 LAB — MAGNESIUM: Magnesium: 2.1 mg/dL (ref 1.7–2.4)

## 2021-07-21 MED ORDER — ONDANSETRON HCL 4 MG/2ML IJ SOLN
4.0000 mg | Freq: Four times a day (QID) | INTRAMUSCULAR | Status: DC | PRN
  Administered 2021-07-23 (×2): 4 mg via INTRAVENOUS
  Filled 2021-07-21 (×2): qty 2

## 2021-07-21 MED ORDER — FLUTICASONE PROPIONATE 50 MCG/ACT NA SUSP
1.0000 | Freq: Every day | NASAL | Status: DC | PRN
  Filled 2021-07-21: qty 16

## 2021-07-21 MED ORDER — FLECAINIDE ACETATE 50 MG PO TABS
50.0000 mg | ORAL_TABLET | Freq: Two times a day (BID) | ORAL | Status: DC
  Administered 2021-07-21 – 2021-07-24 (×5): 50 mg via ORAL
  Filled 2021-07-21 (×7): qty 1

## 2021-07-21 MED ORDER — MOMETASONE FURO-FORMOTEROL FUM 200-5 MCG/ACT IN AERO
2.0000 | INHALATION_SPRAY | Freq: Two times a day (BID) | RESPIRATORY_TRACT | Status: DC
Start: 2021-07-21 — End: 2021-07-24
  Administered 2021-07-22 – 2021-07-24 (×5): 2 via RESPIRATORY_TRACT
  Filled 2021-07-21 (×2): qty 8.8

## 2021-07-21 MED ORDER — HYDROMORPHONE HCL 1 MG/ML IJ SOLN
1.0000 mg | Freq: Once | INTRAMUSCULAR | Status: AC
  Administered 2021-07-21: 1 mg via INTRAVENOUS
  Filled 2021-07-21: qty 1

## 2021-07-21 MED ORDER — LACTATED RINGERS IV SOLN: INTRAVENOUS | Status: DC

## 2021-07-21 MED ORDER — ACETAMINOPHEN 650 MG RE SUPP: 650.0000 mg | Freq: Four times a day (QID) | RECTAL | Status: DC | PRN

## 2021-07-21 MED ORDER — METRONIDAZOLE 500 MG/100ML IV SOLN
500.0000 mg | Freq: Two times a day (BID) | INTRAVENOUS | Status: DC
  Administered 2021-07-21 – 2021-07-22 (×2): 500 mg via INTRAVENOUS
  Filled 2021-07-21 (×2): qty 100

## 2021-07-21 MED ORDER — PANTOPRAZOLE SODIUM 40 MG PO TBEC
80.0000 mg | DELAYED_RELEASE_TABLET | Freq: Every day | ORAL | Status: DC
  Administered 2021-07-21 – 2021-07-24 (×3): 80 mg via ORAL
  Filled 2021-07-21 (×3): qty 2

## 2021-07-21 MED ORDER — FUROSEMIDE 20 MG PO TABS
20.0000 mg | ORAL_TABLET | Freq: Every day | ORAL | Status: DC
Start: 2021-07-21 — End: 2021-07-24
  Administered 2021-07-23 – 2021-07-24 (×2): 20 mg via ORAL
  Filled 2021-07-21 (×3): qty 1

## 2021-07-21 MED ORDER — IOHEXOL 350 MG/ML SOLN
100.0000 mL | Freq: Once | INTRAVENOUS | Status: AC | PRN
  Administered 2021-07-21: 100 mL via INTRAVENOUS

## 2021-07-21 MED ORDER — HYDROMORPHONE HCL 1 MG/ML IJ SOLN: 0.5000 mg | INTRAMUSCULAR | Status: DC | PRN

## 2021-07-21 MED ORDER — OXYCODONE HCL 5 MG PO TABS
5.0000 mg | ORAL_TABLET | ORAL | Status: DC | PRN
  Administered 2021-07-21: 5 mg via ORAL
  Filled 2021-07-21: qty 1

## 2021-07-21 MED ORDER — SODIUM CHLORIDE 0.9 % IV SOLN: 2.0000 g | INTRAVENOUS | Status: DC

## 2021-07-21 MED ORDER — ACETAMINOPHEN 325 MG PO TABS
650.0000 mg | ORAL_TABLET | Freq: Four times a day (QID) | ORAL | Status: DC | PRN
  Administered 2021-07-22: 650 mg via ORAL
  Filled 2021-07-21: qty 2

## 2021-07-21 MED ORDER — ALBUTEROL SULFATE (2.5 MG/3ML) 0.083% IN NEBU: 2.5000 mg | INHALATION_SOLUTION | RESPIRATORY_TRACT | Status: DC | PRN

## 2021-07-21 MED ORDER — ONDANSETRON HCL 4 MG/2ML IJ SOLN
4.0000 mg | Freq: Once | INTRAMUSCULAR | Status: AC
  Administered 2021-07-21: 4 mg via INTRAVENOUS
  Filled 2021-07-21: qty 2

## 2021-07-21 MED ORDER — IRBESARTAN 300 MG PO TABS
300.0000 mg | ORAL_TABLET | Freq: Every day | ORAL | Status: DC
  Administered 2021-07-21 – 2021-07-24 (×3): 300 mg via ORAL
  Filled 2021-07-21 (×4): qty 1

## 2021-07-21 MED ORDER — ACETAMINOPHEN 325 MG PO TABS: 650.0000 mg | ORAL_TABLET | Freq: Four times a day (QID) | ORAL | Status: DC

## 2021-07-21 MED ORDER — HYDROMORPHONE HCL 1 MG/ML IJ SOLN
0.5000 mg | INTRAMUSCULAR | Status: DC | PRN
  Administered 2021-07-22: 0.5 mg via INTRAVENOUS
  Filled 2021-07-21: qty 1

## 2021-07-21 MED ORDER — CEFTRIAXONE SODIUM 1 G IJ SOLR
1.0000 g | Freq: Once | INTRAMUSCULAR | Status: AC
  Administered 2021-07-21: 1 g via INTRAVENOUS
  Filled 2021-07-21: qty 10

## 2021-07-21 MED ORDER — MONTELUKAST SODIUM 10 MG PO TABS
10.0000 mg | ORAL_TABLET | Freq: Every day | ORAL | Status: DC
Start: 2021-07-21 — End: 2021-07-24
  Administered 2021-07-21 – 2021-07-24 (×3): 10 mg via ORAL
  Filled 2021-07-21 (×4): qty 1

## 2021-07-21 MED ORDER — HYDROCODONE-ACETAMINOPHEN 5-325 MG PO TABS: 1.0000 | ORAL_TABLET | ORAL | Status: DC | PRN

## 2021-07-21 NOTE — Assessment & Plan Note (Signed)
Elevated, likely pain contributing ?Continue home medication of micardis 80mg  daily and lasix ?Hydralazine prn and pain control  ?

## 2021-07-21 NOTE — Assessment & Plan Note (Signed)
Continue PPI ?

## 2021-07-21 NOTE — Assessment & Plan Note (Signed)
Does not use cpap, wife states they were told he didn't need this.  ?

## 2021-07-21 NOTE — Assessment & Plan Note (Signed)
Stable, continue to monitor  ?

## 2021-07-21 NOTE — ED Triage Notes (Signed)
Pt BIB [facility] from [facility] d/t abd pain that started this morning around 5am. Nausea and vomiting no diarrhea. Hurts in all quadrants, appears distended. No SOB. A&O X4.  ?

## 2021-07-21 NOTE — Discharge Instructions (Signed)
Pt's symptoms are concerning for possible thoracic dissection.  ?Pt with multiple comorbidities and signs of peritonitis noted on exam. ?Pt unstable to drive himself thus EMS was contacted for transport to the ER. ?

## 2021-07-21 NOTE — ED Notes (Signed)
Patient is being discharged from the Urgent Care and sent to the Emergency Department via EMS . Per WC, patient is in need of higher level of care due to abd and chest pain. Patient is aware and verbalizes understanding of plan of care.  ?Vitals:  ? 07/21/21 0941  ?BP: (!) 176/79  ?Pulse: 73  ?Resp: 18  ?Temp: 97.8 ?F (36.6 ?C)  ?SpO2: 94%  ?  ?

## 2021-07-21 NOTE — Consult Note (Signed)
Surgical Evaluation ?Requesting provider: Godfrey Pick, MD ? ?Chief Complaint: Abdominal pain ? ?HPI: 81 year old male with multiple medical problems as listed below, including atrial fibrillation on Eliquis (last dose yesterday evening) who presented with acute onset abdominal pain which awoke him from sleep abruptly at around 3:00 in the morning.  The pain was in his distal sternum/xiphoid area, and radiating down to his mid abdomen.  Described as sharp and stabbing.  He tried to induce emesis but was unable to do so.  He has felt intermittently nauseated. ?Currently he notes that the pain is in the right hemiabdomen, slightly improved after medication. ?Previous abdominal surgery includes prostatectomy via low midline incision. ? ?Allergies  ?Allergen Reactions  ? Aminophylline   ? Carvedilol Shortness Of Breath and Other (See Comments)  ?  Fatigue  ? Tiotropium Rash  ? Zolpidem Other (See Comments)  ?  Hallucinations  ? Carvedilol   ?  SOB/Fatigue  ? Lisinopril Cough  ? Morphine Other (See Comments)  ? Tenormin [Atenolol] Other (See Comments)  ?  Fatigue, low HR  ? Tiotropium Bromide Monohydrate Other (See Comments)  ? Zolpidem Tartrate Other (See Comments)  ?  hallucinations  ? Aminophylline Other (See Comments)  ?  Unknown  ? Atenolol Other (See Comments)  ?  Fatigue, Low HR  ? Lisinopril Cough  ? ? ?Past Medical History:  ?Diagnosis Date  ? A-fib ([place])   ? Angina   ? chest pain- cardiac cath. followed in 2010, record available ,  told then that he should f/u /w Dr. Marisue Humble    ? Arthritis   ? R knee, back    ? Asthma   ? uses  singulair  ? Cancer [facility])   ? prostate, melanoma- 2010, excision    ? CHF (congestive heart failure) ([place])   ? Chronic kidney disease   ? prostate cancer - surg. removal- 1996  ? Colon polyps   ? Diverticulitis   ? Dysrhythmia   ? palpitations, followed by Dr.Ehinger, seen in prep for surgery on 03/27/2011   ? GERD (gastroesophageal reflux disease)   ? Hepatitis   ? jaundice- many yrs. ago   ? Hiatal hernia   ? Hypertension   ? Melanoma ([place])   ? Face  ? OSA (obstructive sleep apnea) 04/11/2015  ? Mild to moderate OSA with AHI 13.3/hr overall and AHI 36.8/hr during REM sleep.  Oxygen saturations were as low as 86% during respiratory events.  ? Pneumonia   ? hosp. 20 yrs. ago  ? Prostate cancer ([place]) 1995  ? Sleep apnea   ? study done, 10 yrs. ago, told that he needed  CPAP but  never used   ? ? ?Past Surgical History:  ?Procedure Laterality Date  ? CARDIAC CATHETERIZATION    ? 2010  ? CARDIAC CATHETERIZATION N/A 10/22/2015  ? Procedure: Left Heart Cath and Coronary Angiography;  Surgeon: Nelva Bush, MD;  Location: [place] CV LAB;  Service: Cardiovascular;  Laterality: N/A;  ? CARDIAC CATHETERIZATION    ? FRACTURE SURGERY    ? L wrist, hardware- 1982  ? JOINT REPLACEMENT    ? L knee, 2009  ? LEFT HEART CATHETERIZATION WITH CORONARY ANGIOGRAM N/A 09/08/2011  ? Procedure: LEFT HEART CATHETERIZATION WITH CORONARY ANGIOGRAM;  Surgeon: Sinclair Grooms, MD;  Location: [facility] CATH LAB;  Service: Cardiovascular;  Laterality: N/A;  ? LUNG CANCER SURGERY    ? PROSTATECTOMY    ? for ca  ? TONSILLECTOMY    ?  as child  ? TOTAL KNEE ARTHROPLASTY  04/22/2011  ? Procedure: TOTAL KNEE ARTHROPLASTY;  Surgeon: Garald Balding, MD;  Location: [place];  Service: Orthopedics;  Laterality: Right;  ? ? ?Family History  ?Problem Relation Age of Onset  ? Crohn's disease Brother   ? Breast cancer Mother   ? Alzheimer's disease Father   ? Heart disease Brother   ? Hypertension Brother   ? Anesthesia problems Neg Hx   ? Hypotension Neg Hx   ? Malignant hyperthermia Neg Hx   ? Pseudochol deficiency Neg Hx   ? ? ?Social History  ? ?Socioeconomic History  ? Marital status: Married  ?  Spouse name: Not on file  ? Number of children: Not on file  ? Years of education: Not on file  ? Highest education level: Not on file  ?Occupational History  ? Not on file  ?Tobacco Use  ? Smoking status: Former  ?  Packs/day: 1.50  ?  Years: 30.00  ?   Pack years: 45.00  ?  Types: Cigarettes  ?  Quit date: 04/11/1983  ?  Years since quitting: 38.3  ? Smokeless tobacco: Never  ?Vaping Use  ? Vaping Use: Never used  ?Substance and Sexual Activity  ? Alcohol use: No  ? Drug use: No  ? Sexual activity: Not on file  ?Other Topics Concern  ? Not on file  ?Social History Narrative  ? ** Merged History Encounter **  ?    ? ?Social Determinants of Health  ? ?Financial Resource Strain: Not on file  ?Food Insecurity: Not on file  ?Transportation Needs: Not on file  ?Physical Activity: Not on file  ?Stress: Not on file  ?Social Connections: Not on file  ? ? ?No current facility-administered medications on file prior to encounter.  ? ?Current Outpatient Medications on File Prior to Encounter  ?Medication Sig Dispense Refill  ? acetaminophen (TYLENOL) 500 MG tablet Take 2 tablets (1,000 mg total) by mouth every 8 (eight) hours as needed. (Patient taking differently: Take 1,000 mg by mouth every 8 (eight) hours as needed for mild pain.) 30 tablet 0  ? albuterol (PROVENTIL) (2.5 MG/3ML) 0.083% nebulizer solution Take 2.5 mg by nebulization every 4 (four) hours as needed for shortness of breath.    ? albuterol (VENTOLIN HFA) 108 (90 Base) MCG/ACT inhaler Inhale 1-2 puffs into the lungs every 6 (six) hours as needed for wheezing or shortness of breath. 18 g 0  ? apixaban (ELIQUIS) 5 MG TABS tablet Take 1 tablet (5 mg total) by mouth 2 (two) times daily. 180 tablet 1  ? Ascorbic Acid (VITAMIN C) 1000 MG tablet Take 1,000 mg by mouth daily.    ? benzonatate (TESSALON) 100 MG capsule Take 200 mg by mouth daily as needed for cough.    ? budesonide-formoterol (SYMBICORT) 160-4.5 MCG/ACT inhaler Inhale 2 puffs into the lungs 2 (two) times daily. 2 Inhaler 0  ? Calcium Carbonate-Vitamin D 600-200 MG-UNIT TABS Take 1 tablet by mouth 2 (two) times daily.    ? docusate sodium (COLACE) 100 MG capsule Take 1 capsule (100 mg total) by mouth 2 (two) times daily as needed for mild constipation. 10  capsule 0  ? flecainide (TAMBOCOR) 50 MG tablet Take 1 tablet (50 mg total) by mouth 2 (two) times daily. Please keep upcoming appt. With Dr. Tamala Julian in order to receive future refills. 180 tablet 3  ? fluticasone (FLONASE) 50 MCG/ACT nasal spray Place 1 spray into both nostrils daily  as needed for allergies or rhinitis.    ? furosemide (LASIX) 20 MG tablet Take 20mg  daily with extra 20mg  (total of 40mg ) every other day (Patient taking differently: Take 20 mg by mouth daily.) 60 tablet 1  ? montelukast (SINGULAIR) 10 MG tablet Take 10 mg by mouth daily.    ? Multiple Vitamin (MULTIVITAMIN WITH MINERALS) TABS tablet Take 1 tablet by mouth daily.    ? niacinamide 500 MG tablet Take 500 mg by mouth 2 (two) times daily with a meal.    ? Omega-3 Fatty Acids (FISH OIL) 1200 MG CAPS Take 1,200 mg by mouth 2 (two) times daily.    ? omeprazole (PRILOSEC) 40 MG capsule Take 40 mg by mouth at bedtime.     ? polyvinyl alcohol (LIQUIFILM TEARS) 1.4 % ophthalmic solution Place 1 drop into both eyes as needed for dry eyes. 15 mL 0  ? Potassium 99 MG TABS Take 99 mg by mouth daily.    ? telmisartan (MICARDIS) 80 MG tablet Take 80 mg by mouth daily.    ? ? ?Review of Systems: a complete, 10pt review of systems was completed with pertinent positives and negatives as documented in the HPI ? ?Physical Exam: ?Vitals:  ? 07/21/21 1330 07/21/21 1415  ?BP: (!) 135/54 (!) 174/75  ?Pulse: 83 85  ?Resp: 17 15  ?Temp:    ?SpO2: 94% 92%  ? ?Gen: A&Ox3, no distress  ?Eyes: lids and conjunctivae normal, no icterus. Pupils equally round and reactive to light.  ?Neck: supple without mass or thyromegaly ?Chest: respiratory effort is normal. No crepitus or tenderness on palpation of the chest.   ?Cardiovascular: RRR, ble edema ?Gastrointestinal: soft, nondistended, tender without peritonitis along the right upper quadrant and right hemiabdomen. No mass, hepatomegaly or splenomegaly.  Reducible umbilical hernia.  Well-healed low midline vertical  scar. ?Muscoloskeletal: no clubbing or cyanosis of the fingers.  ?Neuro: cranial nerves grossly intact.  Sensation intact to light touch diffusely. ?Psych: appropriate mood and affect, normal insight/judgme

## 2021-07-21 NOTE — H&P (Signed)
?History and Physical  ? ? ?Patient: [name] [name] JJO:[id number] DOB: 1940-10-20 ?DOA: 07/21/2021 ?DOS: the patient was seen and examined on 07/21/2021 ?PCP: Gaynelle Arabian, MD  ?Patient coming from:  urgent care  - lives with his wife. Uses cane rarely.  ? ? ?Chief Complaint: abdominal pain  ? ?HPI: [name] [name] is a 81 y.o. male with medical history significant of atrial fibrillation, COPD, hx of prostate cancer s/p prostatectomy, hx of lung cancer and melanoma, CKD stage 3, GERD, moderate pulmonary HTN, HTN, OSA who presented to ED from urgent care with complaints of abdominal pain.  Pain started abruptly around 3AM and was generalized from top of abdomen to bottom. He states pain was a 10/10 and "really bad." It was constant and nothing made it better or worse. Radiated to right of abdomen. He had associated nausea and one episode of vomiting this AM. Denies any diarrhea, fever/chills. He also endorses chest pain under distal sternum, reproducible at times.  ? ?Previous abdominal surgery: prostatectomy   ? ? ?He has been feeling good up until early this AM.  Denies any fever/chills, vision changes/headaches,  palpitations, shortness of breath or cough, diarrhea, dysuria. Leg swelling at baseline.  ? ?He does not smoke or drink.  ? ? ?ER Course:  afebrile, bp: 170/73, HR; 77, RR: 17, oxygen: 94%RA ?Pertinent labs: total bilirubin: 1.6, wbc: 12.3,  ?RUQ [place]: There is layering of sludge and possible tiny gallbladder stones in the dependent portion of gallbladder lumen. There is trace amount of fluid adjacent to the gallbladder. Technologist observed focal ?tenderness over the gallbladder during the study. Please correlate ?with clinical symptoms and laboratory findings to evaluate for acute ?cholecystitis. There is no dilation of bile ducts ?CTA chest/abdo/pelvis: no dissection. Cannot exclude gallbladder wall inflammation due to cholecystitis. Short segment of mild wall thickening involving the ascending  colon where it abuts the gallbladder.  ?Ventral wall hernia non obstructed loop of small bowel.  ? ?IN ED: general surgery consulted. Given flagyl and rocephin and IVF.  ? ? ?Review of Systems: As mentioned in the history of present illness. All other systems reviewed and are negative. ?Past Medical History:  ?Diagnosis Date  ? A-fib ([place])   ? Angina   ? chest pain- cardiac cath. followed in 2010, record available ,  told then that he should f/u /w Dr. Marisue Humble    ? Arthritis   ? R knee, back    ? Asthma   ? uses  singulair  ? Cancer [facility]   ? prostate, melanoma- 2010, excision    ? CHF (congestive heart failure) ([place])   ? Chronic kidney disease   ? prostate cancer - surg. removal- 1996  ? Colon polyps   ? Diverticulitis   ? Dysrhythmia   ? palpitations, followed by Dr.Ehinger, seen in prep for surgery on 03/27/2011   ? GERD (gastroesophageal reflux disease)   ? Hepatitis   ? jaundice- many yrs. ago  ? Hiatal hernia   ? Hypertension   ? Melanoma ([place])   ? Face  ? OSA (obstructive sleep apnea) 04/11/2015  ? Mild to moderate OSA with AHI 13.3/hr overall and AHI 36.8/hr during REM sleep.  Oxygen saturations were as low as 86% during respiratory events.  ? Pneumonia   ? hosp. 20 yrs. ago  ? Prostate cancer ([place]) 1995  ? Sleep apnea   ? study done, 10 yrs. ago, told that he needed  CPAP but  never used   ? ?Past  Surgical History:  ?Procedure Laterality Date  ? CARDIAC CATHETERIZATION    ? 2010  ? CARDIAC CATHETERIZATION N/A 10/22/2015  ? Procedure: Left Heart Cath and Coronary Angiography;  Surgeon: Nelva Bush, MD;  Location: [facility];  Service: Cardiovascular;  Laterality: N/A;  ? CARDIAC CATHETERIZATION    ? FRACTURE SURGERY    ? L wrist, hardware- 1982  ? JOINT REPLACEMENT    ? L knee, 2009  ? LEFT HEART CATHETERIZATION WITH CORONARY ANGIOGRAM N/A 09/08/2011  ? Procedure: LEFT HEART CATHETERIZATION WITH CORONARY ANGIOGRAM;  Surgeon: Sinclair Grooms, MD;  Location: [facility] CATH LAB;  Service: Cardiovascular;   Laterality: N/A;  ? LUNG CANCER SURGERY    ? PROSTATECTOMY    ? for ca  ? TONSILLECTOMY    ? as child  ? TOTAL KNEE ARTHROPLASTY  04/22/2011  ? Procedure: TOTAL KNEE ARTHROPLASTY;  Surgeon: Garald Balding, MD;  Location: [facility];  Service: Orthopedics;  Laterality: Right;  ? ?Social History:  reports that he quit smoking about 38 years ago. His smoking use included cigarettes. He has a 45.00 pack-year smoking history. He has never used smokeless tobacco. He reports that he does not drink alcohol and does not use drugs. ? ?Allergies  ?Allergen Reactions  ? Aminophylline   ? Carvedilol Shortness Of Breath and Other (See Comments)  ?  Fatigue  ? Tiotropium Rash  ? Zolpidem Other (See Comments)  ?  Hallucinations  ? Carvedilol   ?  SOB/Fatigue  ? Lisinopril Cough  ? Morphine Other (See Comments)  ? Tenormin [Atenolol] Other (See Comments)  ?  Fatigue, low HR  ? Tiotropium Bromide Monohydrate Other (See Comments)  ? Zolpidem Tartrate Other (See Comments)  ?  hallucinations  ? Aminophylline Other (See Comments)  ?  Unknown  ? Atenolol Other (See Comments)  ?  Fatigue, Low HR  ? Lisinopril Cough  ? ? ?Family History  ?Problem Relation Age of Onset  ? Crohn's disease Brother   ? Breast cancer Mother   ? Alzheimer's disease Father   ? Heart disease Brother   ? Hypertension Brother   ? Anesthesia problems Neg Hx   ? Hypotension Neg Hx   ? Malignant hyperthermia Neg Hx   ? Pseudochol deficiency Neg Hx   ? ? ?Prior to Admission medications   ?Medication Sig Start Date End Date Taking? Authorizing Provider  ?acetaminophen (TYLENOL) 500 MG tablet Take 2 tablets (1,000 mg total) by mouth every 8 (eight) hours as needed. ?Patient taking differently: Take 1,000 mg by mouth every 8 (eight) hours as needed for mild pain. 10/19/20  Yes Maczis, Barth Kirks, PA-C  ?albuterol (PROVENTIL) (2.5 MG/3ML) 0.083% nebulizer solution Take 2.5 mg by nebulization every 4 (four) hours as needed for shortness of breath. 06/17/21  Yes [provider]  ?albuterol (VENTOLIN HFA) 108 (90 Base) MCG/ACT inhaler Inhale 1-2 puffs into the lungs every 6 (six) hours as needed for wheezing or shortness of breath. 02/19/20  Yes Wieters, Hallie C, PA-C  ?apixaban (ELIQUIS) 5 MG TABS tablet Take 1 tablet (5 mg total) by mouth 2 (two) times daily. 04/16/21  Yes Belva Crome, MD  ?Ascorbic Acid (VITAMIN C) 1000 MG tablet Take 1,000 mg by mouth daily.   Yes [provider]  ?benzonatate (TESSALON) 100 MG capsule Take 200 mg by mouth daily as needed for cough. 11/30/20  Yes [provider]  ?budesonide-formoterol (SYMBICORT) 160-4.5 MCG/ACT inhaler Inhale 2 puffs into the lungs 2 (  two) times daily. 05/22/17  Yes Juanito Doom, MD  ?Calcium Carbonate-Vitamin D 600-200 MG-UNIT TABS Take 1 tablet by mouth 2 (two) times daily.   Yes [provider]  ?docusate sodium (COLACE) 100 MG capsule Take 1 capsule (100 mg total) by mouth 2 (two) times daily as needed for mild constipation. 10/19/20  Yes Maczis, Barth Kirks, PA-C  ?flecainide (TAMBOCOR) 50 MG tablet Take 1 tablet (50 mg total) by mouth 2 (two) times daily. Please keep upcoming appt. With Dr. Tamala Julian in order to receive future refills. 12/21/20  Yes Belva Crome, MD  ?fluticasone [facility]) 50 MCG/ACT nasal spray Place 1 spray into both nostrils daily as needed for allergies or rhinitis.   Yes [provider]  ?furosemide (LASIX) 20 MG tablet Take 20mg  daily with extra 20mg  (total of 40mg ) every other day ?Patient taking differently: Take 20 mg by mouth daily. 09/20/20  Yes Sherran Needs, NP  ?montelukast (SINGULAIR) 10 MG tablet Take 10 mg by mouth daily. 09/25/20  Yes [provider]  ?Multiple Vitamin (MULTIVITAMIN WITH MINERALS) TABS tablet Take 1 tablet by mouth daily.   Yes [provider]  ?niacinamide 500 MG tablet Take 500 mg by mouth 2 (two) times daily with a meal.   Yes [provider]  ?Omega-3 Fatty Acids (FISH OIL) 1200 MG CAPS Take  1,200 mg by mouth 2 (two) times daily.   Yes [provider]  ?omeprazole (PRILOSEC) 40 MG capsule Take 40 mg by mouth at bedtime.    Yes [provider]  ?polyvinyl alcohol (LIQUIFILM TEARS) 1.4 % oph

## 2021-07-21 NOTE — ED Provider Notes (Signed)
?[facility] ? ? ? ?CSN: [id number] ?Arrival date & time: 07/21/21  1601 ? ? ?  ? ?History   ?Chief Complaint ?Chief Complaint  ?Patient presents with  ? Abdominal Pain  ? ? ?HPI ?[name] [name] is a 81 y.o. male.  ? ?81yo male presents today with 10/10 pain, primarily in his abdomen. He states he woke up from sleep abruptly at 3am with pain starting in his distal sternum, and extending down to his mid abdomen. He reports it is sharp and stabbing. He has longstanding hx of HTN, states the top number is routinely elevated at home. Hx of afib, on anticoagulation. He states he tried to make himself vomit, but was unable. He states it also hurts mildly in his chest. Had cath in 2010 with no stent placement per pt. Denies known hx of AAA. States he was "drenched in sweat" when he woke up.  ? ? ?Abdominal Pain ? ?Past Medical History:  ?Diagnosis Date  ? A-fib ([place])   ? Angina   ? chest pain- cardiac cath. followed in 2010, record available ,  told then that he should f/u /w Dr. Marisue Humble    ? Arthritis   ? R knee, back    ? Asthma   ? uses  singulair  ? Cancer [facility])   ? prostate, melanoma- 2010, excision    ? CHF (congestive heart failure) ([place])   ? Chronic kidney disease   ? prostate cancer - surg. removal- 1996  ? Colon polyps   ? Diverticulitis   ? Dysrhythmia   ? palpitations, followed by Dr.Ehinger, seen in prep for surgery on 03/27/2011   ? GERD (gastroesophageal reflux disease)   ? Hepatitis   ? jaundice- many yrs. ago  ? Hiatal hernia   ? Hypertension   ? Melanoma ([place])   ? Face  ? OSA (obstructive sleep apnea) 04/11/2015  ? Mild to moderate OSA with AHI 13.3/hr overall and AHI 36.8/hr during REM sleep.  Oxygen saturations were as low as 86% during respiratory events.  ? Pneumonia   ? hosp. 20 yrs. ago  ? Prostate cancer ([place]) 1995  ? Sleep apnea   ? study done, 10 yrs. ago, told that he needed  CPAP but  never used   ? ? ?Patient Active Problem List  ? Diagnosis Date Noted  ? Rib fractures 10/14/2020  ?  Prolonged QT interval 08/12/2020  ? Allergic rhinitis 06/14/2019  ? Beat, premature ventricular 06/14/2019  ? H/O adenomatous polyp of colon 06/14/2019  ? Malignant neoplasm of upper lobe of right lung ([place]) 01/18/2019  ? Low back pain 10/05/2018  ? Left foot pain 08/12/2018  ? Constipation   ? Lobar pneumonia ([place]) 02/27/2017  ? COPD with acute exacerbation ([place]) 02/27/2017  ? GERD (gastroesophageal reflux disease) 02/27/2017  ? CKD (chronic kidney disease), stage III ([place]) 02/27/2017  ? Acute respiratory failure with hypoxia ([place]) 02/27/2017  ? Anticoagulated 03/19/2016  ? Otorrhagia of left ear 03/19/2016  ? Anxiety 10/30/2015  ? Dyspnea 08/20/2015  ? Solitary pulmonary nodule 05/18/2015  ? OSA (obstructive sleep apnea) 04/11/2015  ? Chronic anticoagulation 03/17/2015  ? Asthma, chronic obstructive, with acute exacerbation ([place]) 02/26/2015  ? PAF (paroxysmal atrial fibrillation) ([place]) 01/26/2015  ? Actinic keratoses 11/23/2014  ? Awareness of heartbeats 11/23/2014  ? Suspected congestive heart failure 05/05/2014  ? Suspected sleep apnea 05/05/2014  ? 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 Postoperative anemia due to acute blood loss 04/24/2011  ? Osteoarthritis  of knee 04/23/2011  ? Hypertension 04/23/2011  ? History of prostate cancer 04/23/2011  ? Melanoma ([place]) 04/23/2011  ? Hiatal hernia 04/23/2011  ? Asthma 04/23/2011  ? 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 ? ?Past Surgical History:  ?Procedure Laterality Date  ? CARDIAC CATHETERIZATION    ? 2010  ? CARDIAC CATHETERIZATION N/A 10/22/2015  ? Procedure: Left Heart Cath and Coronary Angiography;  Surgeon: Nelva Bush, MD;  Location: [facility];  Service: Cardiovascular;  Laterality: N/A;  ? CARDIAC CATHETERIZATION    ? FRACTURE SURGERY    ? L wrist, hardware- 1982  ? JOINT REPLACEMENT    ? L knee, 2009  ? LEFT HEART CATHETERIZATION WITH CORONARY ANGIOGRAM N/A 09/08/2011  ? Procedure: LEFT HEART CATHETERIZATION WITH  CORONARY ANGIOGRAM;  Surgeon: Sinclair Grooms, MD;  Location: [facility] CATH LAB;  Service: Cardiovascular;  Laterality: N/A;  ? LUNG CANCER SURGERY    ? PROSTATECTOMY    ? for ca  ? TONSILLECTOMY    ? as child  ? TOTAL KNEE ARTHROPLASTY  04/22/2011  ? Procedure: TOTAL KNEE ARTHROPLASTY;  Surgeon: Garald Balding, MD;  Location: [facility];  Service: Orthopedics;  Laterality: Right;  ? ? ? ? ? ?Home Medications   ? ?Prior to Admission medications   ?Medication Sig Start Date End Date Taking? Authorizing Provider  ?acetaminophen (TYLENOL) 500 MG tablet Take 2 tablets (1,000 mg total) by mouth every 8 (eight) hours as needed. 10/19/20   Maczis, Barth Kirks, PA-C  ?albuterol (VENTOLIN HFA) 108 (90 Base) MCG/ACT inhaler Inhale 1-2 puffs into the lungs every 6 (six) hours as needed for wheezing or shortness of breath. 02/19/20   Wieters, Hallie C, PA-C  ?apixaban (ELIQUIS) 5 MG TABS tablet Take 1 tablet (5 mg total) by mouth 2 (two) times daily. 04/16/21   Belva Crome, MD  ?benzonatate (TESSALON) 100 MG capsule Take 200 mg by mouth as needed. 11/30/20   [provider]  ?budesonide-formoterol (SYMBICORT) 160-4.5 MCG/ACT inhaler Inhale 2 puffs into the lungs 2 (two) times daily. 05/22/17   Juanito Doom, MD  ?Calcium Carbonate-Vitamin D 600-200 MG-UNIT TABS Take 1 tablet by mouth 2 (two) times daily.    [provider]  ?diltiazem (TIAZAC) 120 MG 24 hr capsule Take 120 mg by mouth daily.    [provider]  ?docusate sodium (COLACE) 100 MG capsule Take 1 capsule (100 mg total) by mouth 2 (two) times daily as needed for mild constipation. 10/19/20   Maczis, Barth Kirks, PA-C  ?flecainide (TAMBOCOR) 50 MG tablet Take 1 tablet (50 mg total) by mouth 2 (two) times daily. Please keep upcoming appt. With Dr. Tamala Julian in order to receive future refills. 12/21/20   Belva Crome, MD  ?furosemide (LASIX) 20 MG tablet Take 20mg  daily with extra 20mg  (total of 40mg ) every other day 09/20/20   Sherran Needs, NP   ?montelukast (SINGULAIR) 10 MG tablet Take 10 mg by mouth daily. 09/25/20   [provider]  ?Multiple Vitamin (MULTIVITAMIN WITH MINERALS) TABS tablet Take 1 tablet by mouth daily.    [provider]  ?Multiple Vitamins tablet Take 1 tablet by mouth daily.    [provider]  ?neomycin-polymyxin b-dexamethasone (MAXITROL) 3.5-10000-0.1 [facility]) 04/24/20   [provider]  ?omeprazole (PRILOSEC) 40 MG capsule Take 40 mg by mouth at bedtime.     [provider]  ?polyethylene glycol (MIRALAX / GLYCOLAX) 17 g packet  Take 17 g by mouth daily as needed for moderate constipation. ?Patient not taking: Reported on 12/21/2020    [provider]  ?polyvinyl alcohol (LIQUIFILM TEARS) 1.4 % ophthalmic solution Place 1 drop into both eyes as needed for dry eyes. 08/17/20   Bonnell Public, MD  ?polyvinyl alcohol (LIQUIFILM TEARS) 1.4 % ophthalmic solution Apply 1 drop to eye daily. 08/17/20   [provider]  ?polyvinyl alcohol (LIQUIFILM TEARS) 1.4 % ophthalmic solution Place 1 drop into both eyes as needed for dry eyes.    [provider]  ?potassium chloride SA (KLOR-CON) 20 MEQ tablet Take 1 tablet (20 mEq total) by mouth daily. 09/20/20   Sherran Needs, NP  ?potassium chloride SA (KLOR-CON) 20 MEQ tablet Take 20 mEq by mouth daily. ?Patient not taking: Reported on 12/21/2020 09/20/20   [provider]  ?telmisartan (MICARDIS) 80 MG tablet Take 80 mg by mouth daily. 09/06/20   [provider]  ?traMADol (ULTRAM) 50 MG tablet Take 1-2 tablets (50-100 mg total) by mouth every 6 (six) hours as needed for severe pain. 10/19/20   Maczis, Barth Kirks, PA-C  ?vitamin E 1000 UNIT capsule Take 1,000 Units by mouth daily.    [provider]  ? ? ?Family History ?Family History  ?Problem Relation Age of Onset  ? Crohn's disease Brother   ? Breast cancer Mother   ? Alzheimer's disease Father   ? Heart disease Brother   ?  Hypertension Brother   ? Anesthesia problems Neg Hx   ? Hypotension Neg Hx   ? Malignant hyperthermia Neg Hx   ? Pseudochol deficiency Neg Hx   ? ? ?Social History ?Social History  ? ?Tobacco Use  ? Smoking stat

## 2021-07-21 NOTE — Assessment & Plan Note (Signed)
In NSR  ?Continue flecainide 50mg  BID ?Holding eliquis for surgery  ?Keep on telemetry  ?

## 2021-07-21 NOTE — ED Triage Notes (Signed)
Pt here for severe upper abd pain into chest with some diaphoresis that woke him from sleep at 0300 today; pt sts took some maalox without relief ?

## 2021-07-21 NOTE — Assessment & Plan Note (Addendum)
Borderline prolonged qt, has had in past.  ?Potassium wnl, mag wnl  ?Keep on tele, optimize electrolytes ?Avoid qt prolonging drugs, will continue low dose flecainide/inhalers  ?

## 2021-07-21 NOTE — Assessment & Plan Note (Addendum)
81 year old male presenting with one day history of acute onset generalized abdominal pain, nausea found to have elevated WBC and imaging/exam consistent with acute cholecystitis ?-admit to med tele ?-general surgery consulted. Plans for OR this week. Last dose of eliquis was last night.  ?-continue rocephin and flagyl  ?-holding eliquis ?-pain control ?-zofran prn, borderline qt  ?-IVF ?-trend cbc  ?

## 2021-07-21 NOTE — ED Provider Notes (Signed)
?[facility] ?Provider Note ? ? ?CSN: [id number] ?Arrival date & time: 07/21/21  1036 ? ?  ? ?History ? ?Chief Complaint  ?Patient presents with  ? Abdominal Pain  ? ? ?[name] [name] is a 81 y.o. male. ? ? ?Abdominal Pain ?Associated symptoms: nausea and vomiting   ?Patient presents for abdominal pain.  Onset was 3 AM.  Pain woke him up from sleep.  It is sharp and stabbing in nature.  It is primarily throughout his anterior abdomen.  He endorses chronic flank pain from suspected previous rib fractures.  He endorses associated diaphoresis and nausea.  He did vomit 1 time.  He describes the vomitus as clear.  He has had persistent severe pain and nausea since onset.  His last bowel movement was yesterday.  He states that he was in his normal state of health when going to bed last night.  Medical history includes arthritis, HTN, asthma, atrial fibrillation, anxiety, CKD, OSA, melanoma, remote prostate cancer, diverticulitis, CHF.  He was seen in urgent care prior to arrival.  No medications were given at urgent care.  Patient does take Eliquis and his last dose was last night.  He did not take any morning medications.  His last p.o. intake was last night. ?  ? ?Home Medications ?Prior to Admission medications   ?Medication Sig Start Date End Date Taking? Authorizing Provider  ?acetaminophen (TYLENOL) 500 MG tablet Take 2 tablets (1,000 mg total) by mouth every 8 (eight) hours as needed. ?Patient taking differently: Take 1,000 mg by mouth every 8 (eight) hours as needed for mild pain. 10/19/20  Yes Maczis, Barth Kirks, PA-C  ?albuterol (PROVENTIL) (2.5 MG/3ML) 0.083% nebulizer solution Take 2.5 mg by nebulization every 4 (four) hours as needed for shortness of breath. 06/17/21  Yes [provider]  ?albuterol (VENTOLIN HFA) 108 (90 Base) MCG/ACT inhaler Inhale 1-2 puffs into the lungs every 6 (six) hours as needed for wheezing or shortness of breath. 02/19/20  Yes Wieters,  Hallie C, PA-C  ?apixaban (ELIQUIS) 5 MG TABS tablet Take 1 tablet (5 mg total) by mouth 2 (two) times daily. 04/16/21  Yes Belva Crome, MD  ?Ascorbic Acid (VITAMIN C) 1000 MG tablet Take 1,000 mg by mouth daily.   Yes [provider]  ?benzonatate (TESSALON) 100 MG capsule Take 200 mg by mouth daily as needed for cough. 11/30/20  Yes [provider]  ?budesonide-formoterol (SYMBICORT) 160-4.5 MCG/ACT inhaler Inhale 2 puffs into the lungs 2 (two) times daily. 05/22/17  Yes Juanito Doom, MD  ?Calcium Carbonate-Vitamin D 600-200 MG-UNIT TABS Take 1 tablet by mouth 2 (two) times daily.   Yes [provider]  ?docusate sodium (COLACE) 100 MG capsule Take 1 capsule (100 mg total) by mouth 2 (two) times daily as needed for mild constipation. 10/19/20  Yes Maczis, Barth Kirks, PA-C  ?flecainide (TAMBOCOR) 50 MG tablet Take 1 tablet (50 mg total) by mouth 2 (two) times daily. Please keep upcoming appt. With Dr. Tamala Julian in order to receive future refills. 12/21/20  Yes Belva Crome, MD  ?fluticasone [facility]) 50 MCG/ACT nasal spray Place 1 spray into both nostrils daily as needed for allergies or rhinitis.   Yes [provider]  ?furosemide (LASIX) 20 MG tablet Take 20mg  daily with extra 20mg  (total of 40mg ) every other day ?Patient taking differently: Take 20 mg by mouth daily. 09/20/20  Yes Sherran Needs, NP  ?montelukast (SINGULAIR) 10 MG tablet Take 10 mg  by mouth daily. 09/25/20  Yes [provider]  ?Multiple Vitamin (MULTIVITAMIN WITH MINERALS) TABS tablet Take 1 tablet by mouth daily.   Yes [provider]  ?niacinamide 500 MG tablet Take 500 mg by mouth 2 (two) times daily with a meal.   Yes [provider]  ?Omega-3 Fatty Acids (FISH OIL) 1200 MG CAPS Take 1,200 mg by mouth 2 (two) times daily.   Yes [provider]  ?omeprazole (PRILOSEC) 40 MG capsule Take 40 mg by mouth at bedtime.    Yes [provider]  ?polyvinyl alcohol  (LIQUIFILM TEARS) 1.4 % ophthalmic solution Place 1 drop into both eyes as needed for dry eyes. 08/17/20  Yes Dana Allan I, MD  ?Potassium 99 MG TABS Take 99 mg by mouth daily.   Yes [provider]  ?telmisartan (MICARDIS) 80 MG tablet Take 80 mg by mouth daily. 09/06/20  Yes [provider]  ?   ? ?Allergies    ?Aminophylline, Carvedilol, Tiotropium, Zolpidem, Carvedilol, Lisinopril, Morphine, Tenormin [atenolol], Tiotropium bromide monohydrate, Zolpidem tartrate, Aminophylline, Atenolol, and Lisinopril   ? ?Review of Systems   ?Review of Systems  ?Constitutional:  Positive for diaphoresis.  ?Gastrointestinal:  Positive for abdominal pain, nausea and vomiting.  ?All other systems reviewed and are negative. ? ?Physical Exam ?Updated Vital Signs ?BP (!) 174/75   Pulse 85   Temp 97.7 ?F (36.5 ?C) (Oral)   Resp 15   SpO2 92%  ?Physical Exam ?Vitals and nursing note reviewed.  ?Constitutional:   ?   General: He is not in acute distress. ?   Appearance: He is well-developed. He is ill-appearing. He is not toxic-appearing or diaphoretic.  ?HENT:  ?   Head: Normocephalic and atraumatic.  ?   Mouth/Throat:  ?   Mouth: Mucous membranes are moist.  ?   Pharynx: Oropharynx is clear.  ?Eyes:  ?   General: No scleral icterus. ?   Extraocular Movements: Extraocular movements intact.  ?   Conjunctiva/sclera: Conjunctivae normal.  ?Cardiovascular:  ?   Rate and Rhythm: Normal rate and regular rhythm.  ?   Heart sounds: No murmur heard. ?Pulmonary:  ?   Effort: Pulmonary effort is normal. No respiratory distress.  ?Abdominal:  ?   General: There is distension.  ?   Palpations: Abdomen is soft.  ?   Tenderness: There is abdominal tenderness in the epigastric area and periumbilical area.  ?Musculoskeletal:     ?   General: No swelling.  ?   Cervical back: Neck supple.  ?Skin: ?   General: Skin is warm and dry.  ?   Capillary Refill: Capillary refill takes less than 2 seconds.  ?   Coloration: Skin is not  cyanotic, jaundiced or pale.  ?Neurological:  ?   General: No focal deficit present.  ?   Mental Status: He is alert and oriented to person, place, and time.  ?Psychiatric:     ?   Mood and Affect: Mood normal. Mood is not anxious or depressed.     ?   Behavior: Behavior normal.  ? ? ?ED Results / Procedures / Treatments   ?Labs ?(all labs ordered are listed, but only abnormal results are displayed) ?Labs Reviewed  ?COMPREHENSIVE METABOLIC PANEL - Abnormal; Notable for the following components:  ?    Result Value  ? Glucose, Bld 126 (*)   ? Total Protein 6.3 (*)   ? Total Bilirubin 1.6 (*)   ? All other components within normal  limits  ?CBC WITH DIFFERENTIAL/PLATELET - Abnormal; Notable for the following components:  ? WBC 12.3 (*)   ? Neutro Abs 10.6 (*)   ? All other components within normal limits  ?URINALYSIS, ROUTINE W REFLEX MICROSCOPIC - Abnormal; Notable for the following components:  ? Specific Gravity, Urine 1.041 (*)   ? All other components within normal limits  ?PROTIME-INR - Abnormal; Notable for the following components:  ? Prothrombin Time 16.1 (*)   ? INR 1.3 (*)   ? All other components within normal limits  ?I-STAT CHEM 8, ED - Abnormal; Notable for the following components:  ? Glucose, Bld 127 (*)   ? Calcium, Ion 1.08 (*)   ? All other components within normal limits  ?LIPASE, BLOOD  ?MAGNESIUM  ?TROPONIN I (HIGH SENSITIVITY)  ?TROPONIN I (HIGH SENSITIVITY)  ? ? ?EKG ?None ? ?Radiology ?CT Angio Chest/Abd/Pel for Dissection W and/or Wo Contrast ? ?Result Date: 07/21/2021 ?CLINICAL DATA:  Acute aortic syndrome suspected. EXAM: CT ANGIOGRAPHY CHEST, ABDOMEN AND PELVIS TECHNIQUE: Non-contrast CT of the chest was initially obtained. Multidetector CT imaging through the chest, abdomen and pelvis was performed using the standard protocol during bolus administration of intravenous contrast. Multiplanar reconstructed images and MIPs were obtained and reviewed to evaluate the vascular anatomy. RADIATION  DOSE REDUCTION: This exam was performed according to the departmental dose-optimization program which includes automated exposure control, adjustment of the mA and/or kV according to patient size and/or use of it

## 2021-07-22 ENCOUNTER — Inpatient Hospital Stay (HOSPITAL_COMMUNITY): Payer: Medicare Other | Admitting: Certified Registered Nurse Anesthetist

## 2021-07-22 ENCOUNTER — Encounter (HOSPITAL_COMMUNITY): Payer: Self-pay | Admitting: Family Medicine

## 2021-07-22 ENCOUNTER — Encounter (HOSPITAL_COMMUNITY)
Admission: EM | Disposition: A | Discharge status: Home / Self Care | Payer: Self-pay | Source: Ambulatory Visit | Attending: Family Medicine

## 2021-07-22 ENCOUNTER — Other Ambulatory Visit: Payer: Self-pay

## 2021-07-22 DIAGNOSIS — J449 Chronic obstructive pulmonary disease, unspecified: Secondary | ICD-10-CM

## 2021-07-22 DIAGNOSIS — K810 Acute cholecystitis: Secondary | ICD-10-CM | POA: Diagnosis not present

## 2021-07-22 DIAGNOSIS — I509 Heart failure, unspecified: Secondary | ICD-10-CM

## 2021-07-22 DIAGNOSIS — K8000 Calculus of gallbladder with acute cholecystitis without obstruction: Secondary | ICD-10-CM

## 2021-07-22 DIAGNOSIS — I110 Hypertensive heart disease with heart failure: Secondary | ICD-10-CM

## 2021-07-22 HISTORY — PX: CHOLECYSTECTOMY: SHX55

## 2021-07-22 LAB — COMPREHENSIVE METABOLIC PANEL
ALT: 11 U/L (ref 0–44)
AST: 19 U/L (ref 15–41)
Albumin: 3.5 g/dL (ref 3.5–5.0)
Alkaline Phosphatase: 57 U/L (ref 38–126)
Anion gap: 8 (ref 5–15)
BUN: 11 mg/dL (ref 8–23)
CO2: 25 mmol/L (ref 22–32)
Calcium: 8.7 mg/dL — ABNORMAL LOW (ref 8.9–10.3)
Chloride: 104 mmol/L (ref 98–111)
Creatinine, Ser: 1.17 mg/dL (ref 0.61–1.24)
GFR, Estimated: >60 mL/min (ref >60)
Glucose, Bld: 131 mg/dL — ABNORMAL HIGH (ref 70–99)
Potassium: 3.8 mmol/L (ref 3.5–5.1)
Sodium: 137 mmol/L (ref 135–145)
Total Bilirubin: 2.6 mg/dL — ABNORMAL HIGH (ref 0.3–1.2)
Total Protein: 6 g/dL — ABNORMAL LOW (ref 6.5–8.1)

## 2021-07-22 LAB — CBC
HCT: 40.8 % (ref 39.0–52.0)
Hemoglobin: 14 g/dL (ref 13.0–17.0)
MCH: 31 pg (ref 26.0–34.0)
MCHC: 34.3 g/dL (ref 30.0–36.0)
MCV: 90.5 fL (ref 80.0–100.0)
Platelets: 139 10*3/uL — ABNORMAL LOW (ref 150–400)
RBC: 4.51 MIL/uL (ref 4.22–5.81)
RDW: 13.6 % (ref 11.5–15.5)
WBC: 15 10*3/uL — ABNORMAL HIGH (ref 4.0–10.5)
nRBC: 0 % (ref 0.0–0.2)

## 2021-07-22 SURGERY — LAPAROSCOPIC CHOLECYSTECTOMY WITH INTRAOPERATIVE CHOLANGIOGRAM
Anesthesia: General | Site: Abdomen

## 2021-07-22 MED ORDER — INDOCYANINE GREEN 25 MG IV SOLR
2.5000 mg | Freq: Once | INTRAVENOUS | Status: DC
Start: 2021-07-22 — End: 2021-07-22
  Filled 2021-07-22: qty 10

## 2021-07-22 MED ORDER — PIPERACILLIN-TAZOBACTAM 3.375 G IVPB
3.3750 g | Freq: Three times a day (TID) | INTRAVENOUS | Status: AC
  Administered 2021-07-22 – 2021-07-23 (×5): 3.375 g via INTRAVENOUS
  Filled 2021-07-22 (×5): qty 50

## 2021-07-22 MED ORDER — INDOCYANINE GREEN 25 MG IV SOLR
INTRAVENOUS | Status: DC | PRN
  Administered 2021-07-22: 2.5 mg via INTRAVENOUS

## 2021-07-22 MED ORDER — ONDANSETRON HCL 4 MG/2ML IJ SOLN
INTRAMUSCULAR | Status: DC | PRN
  Administered 2021-07-22: 4 mg via INTRAVENOUS

## 2021-07-22 MED ORDER — ONDANSETRON HCL 4 MG/2ML IJ SOLN
INTRAMUSCULAR | Status: AC
  Filled 2021-07-22: qty 2

## 2021-07-22 MED ORDER — BUPIVACAINE-EPINEPHRINE 0.25% -1:200000 IJ SOLN
INTRAMUSCULAR | Status: DC | PRN
  Administered 2021-07-22: 7 mL

## 2021-07-22 MED ORDER — ROCURONIUM BROMIDE 10 MG/ML (PF) SYRINGE
PREFILLED_SYRINGE | INTRAVENOUS | Status: DC | PRN
  Administered 2021-07-22: 60 mg via INTRAVENOUS

## 2021-07-22 MED ORDER — LIDOCAINE 2% (20 MG/ML) 5 ML SYRINGE
INTRAMUSCULAR | Status: DC | PRN
  Administered 2021-07-22: 100 mg via INTRAVENOUS

## 2021-07-22 MED ORDER — 0.9 % SODIUM CHLORIDE (POUR BTL) OPTIME
TOPICAL | Status: DC | PRN
  Administered 2021-07-22: 1000 mL

## 2021-07-22 MED ORDER — OXYCODONE HCL 5 MG/5ML PO SOLN: 5.0000 mg | Freq: Once | ORAL | Status: DC | PRN

## 2021-07-22 MED ORDER — FENTANYL CITRATE (PF) 250 MCG/5ML IJ SOLN
INTRAMUSCULAR | Status: DC | PRN
  Administered 2021-07-22: 50 ug via INTRAVENOUS
  Administered 2021-07-22: 25 ug via INTRAVENOUS
  Administered 2021-07-22: 50 ug via INTRAVENOUS
  Administered 2021-07-22: 25 ug via INTRAVENOUS
  Administered 2021-07-22: 50 ug via INTRAVENOUS

## 2021-07-22 MED ORDER — DEXAMETHASONE SODIUM PHOSPHATE 10 MG/ML IJ SOLN
INTRAMUSCULAR | Status: DC | PRN
  Administered 2021-07-22: 10 mg via INTRAVENOUS

## 2021-07-22 MED ORDER — CHLORHEXIDINE GLUCONATE 0.12 % MT SOLN
OROMUCOSAL | Status: AC
  Administered 2021-07-22: 15 mL via OROMUCOSAL
  Filled 2021-07-22: qty 15

## 2021-07-22 MED ORDER — IPRATROPIUM-ALBUTEROL 0.5-2.5 (3) MG/3ML IN SOLN
RESPIRATORY_TRACT | Status: AC
  Filled 2021-07-22: qty 3

## 2021-07-22 MED ORDER — FENTANYL CITRATE (PF) 250 MCG/5ML IJ SOLN
INTRAMUSCULAR | Status: AC
  Filled 2021-07-22: qty 5

## 2021-07-22 MED ORDER — PROPOFOL 10 MG/ML IV BOLUS
INTRAVENOUS | Status: DC | PRN
  Administered 2021-07-22: 150 mg via INTRAVENOUS
  Administered 2021-07-22: 50 mg via INTRAVENOUS

## 2021-07-22 MED ORDER — SUGAMMADEX SODIUM 200 MG/2ML IV SOLN
INTRAVENOUS | Status: DC | PRN
  Administered 2021-07-22 (×3): 100 mg via INTRAVENOUS

## 2021-07-22 MED ORDER — ALBUTEROL SULFATE (2.5 MG/3ML) 0.083% IN NEBU
2.5000 mg | INHALATION_SOLUTION | Freq: Once | RESPIRATORY_TRACT | Status: AC
  Administered 2021-07-22: 2.5 mg via RESPIRATORY_TRACT

## 2021-07-22 MED ORDER — OXYCODONE HCL 5 MG PO TABS: 5.0000 mg | ORAL_TABLET | Freq: Once | ORAL | Status: DC | PRN

## 2021-07-22 MED ORDER — SODIUM CHLORIDE 0.9 % IR SOLN
Status: DC | PRN
  Administered 2021-07-22 (×3): 1000 mL

## 2021-07-22 MED ORDER — CHLORHEXIDINE GLUCONATE 0.12 % MT SOLN: 15.0000 mL | Freq: Once | OROMUCOSAL | Status: AC

## 2021-07-22 MED ORDER — LACTATED RINGERS IV SOLN: INTRAVENOUS | Status: DC | PRN

## 2021-07-22 MED ORDER — BUPIVACAINE-EPINEPHRINE (PF) 0.25% -1:200000 IJ SOLN
INTRAMUSCULAR | Status: AC
Start: 2021-07-22 — End: ?
  Filled 2021-07-22: qty 30

## 2021-07-22 MED ORDER — ORAL CARE MOUTH RINSE: 15.0000 mL | Freq: Once | OROMUCOSAL | Status: AC

## 2021-07-22 MED ORDER — PHENYLEPHRINE 80 MCG/ML (10ML) SYRINGE FOR IV PUSH (FOR BLOOD PRESSURE SUPPORT)
PREFILLED_SYRINGE | INTRAVENOUS | Status: DC | PRN
  Administered 2021-07-22: 80 ug via INTRAVENOUS

## 2021-07-22 MED ORDER — FENTANYL CITRATE (PF) 100 MCG/2ML IJ SOLN: 25.0000 ug | INTRAMUSCULAR | Status: DC | PRN

## 2021-07-22 MED ORDER — PHENYLEPHRINE 80 MCG/ML (10ML) SYRINGE FOR IV PUSH (FOR BLOOD PRESSURE SUPPORT)
PREFILLED_SYRINGE | INTRAVENOUS | Status: AC
  Filled 2021-07-22: qty 10

## 2021-07-22 MED ORDER — OXYCODONE HCL 5 MG PO TABS: 5.0000 mg | ORAL_TABLET | ORAL | Status: DC | PRN

## 2021-07-22 MED ORDER — ALBUTEROL SULFATE (2.5 MG/3ML) 0.083% IN NEBU
INHALATION_SOLUTION | RESPIRATORY_TRACT | Status: AC
  Filled 2021-07-22: qty 3

## 2021-07-22 MED ORDER — HEMOSTATIC AGENTS (NO CHARGE) OPTIME
TOPICAL | Status: DC | PRN
  Administered 2021-07-22: 1 via TOPICAL

## 2021-07-22 MED ORDER — DEXAMETHASONE SODIUM PHOSPHATE 10 MG/ML IJ SOLN
INTRAMUSCULAR | Status: AC
  Filled 2021-07-22: qty 1

## 2021-07-22 MED ORDER — LACTATED RINGERS IV SOLN: INTRAVENOUS | Status: DC

## 2021-07-22 SURGICAL SUPPLY — 42 items
APPLIER CLIP 5 13 M/L LIGAMAX5 (MISCELLANEOUS) ×2
BAG COUNTER SPONGE SURGICOUNT (BAG) ×2 IMPLANT
BLADE CLIPPER SURG (BLADE) IMPLANT
CANISTER SUCT 3000ML PPV (MISCELLANEOUS) ×2 IMPLANT
CHLORAPREP W/TINT 26 (MISCELLANEOUS) ×2 IMPLANT
CLIP APPLIE 5 13 M/L LIGAMAX5 (MISCELLANEOUS) ×1 IMPLANT
COVER MAYO STAND STRL (DRAPES) IMPLANT
COVER SURGICAL LIGHT HANDLE (MISCELLANEOUS) ×2 IMPLANT
DERMABOND ADVANCED (GAUZE/BANDAGES/DRESSINGS) ×1
DERMABOND ADVANCED .7 DNX12 (GAUZE/BANDAGES/DRESSINGS) ×1 IMPLANT
ELECT REM PT RETURN 9FT ADLT (ELECTROSURGICAL) ×2
ELECTRODE REM PT RTRN 9FT ADLT (ELECTROSURGICAL) ×1 IMPLANT
GLOVE BIO SURGEON STRL SZ7 (GLOVE) ×2 IMPLANT
GLOVE BIOGEL PI IND STRL 7.5 (GLOVE) ×1 IMPLANT
GLOVE BIOGEL PI INDICATOR 7.5 (GLOVE) ×1
GOWN STRL REUS W/ TWL LRG LVL3 (GOWN DISPOSABLE) ×3 IMPLANT
GOWN STRL REUS W/TWL LRG LVL3 (GOWN DISPOSABLE) ×6
GRASPER SUT TROCAR 14GX15 (MISCELLANEOUS) ×2 IMPLANT
HEMOSTAT SNOW SURGICEL 2X4 (HEMOSTASIS) ×1 IMPLANT
KIT BASIN OR (CUSTOM PROCEDURE TRAY) ×2 IMPLANT
KIT IMAGING PINPOINTPAQ (MISCELLANEOUS) ×1 IMPLANT
KIT TURNOVER KIT B (KITS) ×2 IMPLANT
NS IRRIG 1000ML POUR BTL (IV SOLUTION) ×2 IMPLANT
PAD ARMBOARD 7.5X6 YLW CONV (MISCELLANEOUS) ×2 IMPLANT
POUCH RETRIEVAL ECOSAC 10 (ENDOMECHANICALS) ×1 IMPLANT
POUCH RETRIEVAL ECOSAC 10MM (ENDOMECHANICALS) ×2
SCISSORS LAP 5X35 DISP (ENDOMECHANICALS) ×2 IMPLANT
SET IRRIG TUBING LAPAROSCOPIC (IRRIGATION / IRRIGATOR) ×2 IMPLANT
SET TUBE SMOKE EVAC HIGH FLOW (TUBING) ×2 IMPLANT
SLEEVE ENDOPATH XCEL 5M (ENDOMECHANICALS) ×4 IMPLANT
SPECIMEN JAR SMALL (MISCELLANEOUS) ×2 IMPLANT
STRIP CLOSURE SKIN 1/2X4 (GAUZE/BANDAGES/DRESSINGS) ×2 IMPLANT
SUT MNCRL AB 4-0 PS2 18 (SUTURE) ×2 IMPLANT
SUT VICRYL 0 AB UR-6 (SUTURE) ×2 IMPLANT
SUT VICRYL 0 UR6 27IN ABS (SUTURE) ×2 IMPLANT
TOWEL GREEN STERILE (TOWEL DISPOSABLE) ×2 IMPLANT
TOWEL GREEN STERILE FF (TOWEL DISPOSABLE) ×2 IMPLANT
TRAY LAPAROSCOPIC MC (CUSTOM PROCEDURE TRAY) ×2 IMPLANT
TROCAR XCEL 12X100 BLDLESS (ENDOMECHANICALS) ×1 IMPLANT
TROCAR XCEL BLUNT TIP 100MML (ENDOMECHANICALS) ×2 IMPLANT
TROCAR XCEL NON-BLD 5MMX100MML (ENDOMECHANICALS) ×2 IMPLANT
WATER STERILE IRR 1000ML POUR (IV SOLUTION) ×2 IMPLANT

## 2021-07-22 NOTE — Plan of Care (Signed)
Pt arrived on unit around 0130 to RM 5M13. Skin assessment was complete w/ the assistance of Malachy Mood, Therapist, sports. Telemetry placed on pt; verification complete w/ Malachy Mood, Therapist, sports. Assessment complete. VSS. LR running at 100 ml/hr in a IV on L FA. SCDs on. Pt came onto floor on 3 L [place], but removed while being in the room with him. He states that he wears oxygen at home on & off. Pt satting at 95% on RA. Urinal placed near bedside and pt is encouraged to use call light for assistance to bathroom if needed. Pt is oriented to rm and call bell. Call bell and table are within reach of pt. Pt denies any concerns or desires at this moment. Will continue to monitor throughout night. ? ? ? ?Problem: Education: ?Goal: Knowledge of General Education information will improve ?Description: Including pain rating scale, medication(s)/side effects and non-pharmacologic comfort measures ?Outcome: Progressing ?  ?Problem: Health Behavior/Discharge Planning: ?Goal: Ability to manage health-related needs will improve ?Outcome: Progressing ?  ?Problem: Clinical Measurements: ?Goal: Ability to maintain clinical measurements within normal limits will improve ?Outcome: Progressing ?Goal: Will remain free from infection ?Outcome: Progressing ?Goal: Diagnostic test results will improve ?Outcome: Progressing ?Goal: Respiratory complications will improve ?Outcome: Progressing ?Goal: Cardiovascular complication will be avoided ?Outcome: Progressing ?  ?Problem: Activity: ?Goal: Risk for activity intolerance will decrease ?Outcome: Progressing ?  ?Problem: Nutrition: ?Goal: Adequate nutrition will be maintained ?Outcome: Progressing ?  ?Problem: Coping: ?Goal: Level of anxiety will decrease ?Outcome: Progressing ?  ?Problem: Elimination: ?Goal: Will not experience complications related to bowel motility ?Outcome: Progressing ?Goal: Will not experience complications related to urinary retention ?Outcome: Progressing ?  ?Problem: Pain Managment: ?Goal: General  experience of comfort will improve ?Outcome: Progressing ?  ?Problem: Safety: ?Goal: Ability to remain free from injury will improve ?Outcome: Progressing ?  ?Problem: Skin Integrity: ?Goal: Risk for impaired skin integrity will decrease ?Outcome: Progressing ?  ?

## 2021-07-22 NOTE — Transfer of Care (Signed)
Immediate Anesthesia Transfer of Care Note ? ?Patient: [name] [name] ? ?Procedure(s) Performed: LAPAROSCOPIC CHOLECYSTECTOMY (Abdomen) ?INDOCYANINE GREEN FLUORESCENCE IMAGING (ICG) (Abdomen) ? ?Patient Location: PACU ? ?Anesthesia Type:General ? ?Level of Consciousness: awake and patient cooperative ? ?Airway & Oxygen Therapy: Patient Spontanous Breathing and Patient connected to face mask oxygen ? ?Post-op Assessment: Report given to RN and Post -op Vital signs reviewed and stable ? ?Post vital signs: Reviewed and stable ? ?Last Vitals:  ?Vitals Value Taken Time  ?BP 149/63   ?Temp    ?Pulse 86   ?Resp 22   ?SpO2 97%   ? ? ?Last Pain:  ?Vitals:  ? 07/22/21 1250  ?TempSrc: Oral  ?PainSc:   ?   ? ?Patients Stated Pain Goal: 0 (07/22/21 0200) ? ?Complications: No notable events documented. ?

## 2021-07-22 NOTE — Progress Notes (Signed)
? ? ?   ?Subjective: ?Patient tired and wanting to sleep some.  Still with a lot of RUQ abdominal pain.  Patient is quite active normally.  Does not get CP or SOB with activity, except rarely secondary to his a fib for which he uses supplemental O2 maybe 3x a month.   ? ?ROS: See above, otherwise other systems negative ? ?Objective: ?Vital signs in last 24 hours: ?Temp:  [97.7 ?F (36.5 ?C)-99.9 ?F (37.7 ?C)] 98.9 ?F (37.2 ?C) (05/15 5573) ?Pulse Rate:  [73-87] 82 (05/15 0218) ?Resp:  [13-22] 19 (05/15 0218) ?BP: (110-176)/(54-85) 155/59 (05/15 0218) ?SpO2:  [90 %-97 %] 94 % (05/15 0218) ?Weight:  [115.7 kg] 115.7 kg (05/15 0210) ?  ? ?Intake/Output from previous day: ?No intake/output data recorded. ?Intake/Output this shift: ?No intake/output data recorded. ? ?PE: ?Heart: regular currently ?Lungs: CTAB ?Abd: soft, but mildly obese, very tender in RUQ,  ? ?Lab Results:  ?Recent Labs  ?  07/21/21 ?1107 07/21/21 ?1126 07/22/21 ?0252  ?WBC 12.3*  --  15.0*  ?HGB 13.6 13.6 14.0  ?HCT 40.8 40.0 40.8  ?PLT 154  --  139*  ? ?BMET ?Recent Labs  ?  07/21/21 ?1107 07/21/21 ?1126 07/22/21 ?0252  ?NA 142 141 137  ?K 3.7 3.6 3.8  ?CL 109 105 104  ?CO2 26  --  25  ?GLUCOSE 126* 127* 131*  ?BUN 14 16 11   ?CREATININE 1.18 1.20 1.17  ?CALCIUM 9.0  --  8.7*  ? ?PT/INR ?Recent Labs  ?  07/21/21 ?1107  ?LABPROT 16.1*  ?INR 1.3*  ? ?CMP  ?   ?Component Value Date/Time  ? NA 137 07/22/2021 0252  ? NA 144 06/14/2019 1122  ? K 3.8 07/22/2021 0252  ? CL 104 07/22/2021 0252  ? CO2 25 07/22/2021 0252  ? GLUCOSE 131 (H) 07/22/2021 0252  ? BUN 11 07/22/2021 0252  ? BUN 14 06/14/2019 1122  ? CREATININE 1.17 07/22/2021 0252  ? CREATININE 1.00 10/17/2015 0948  ? CALCIUM 8.7 (L) 07/22/2021 0252  ? PROT 6.0 (L) 07/22/2021 0252  ? PROT 6.1 06/14/2019 1122  ? ALBUMIN 3.5 07/22/2021 0252  ? ALBUMIN 3.9 06/14/2019 1122  ? AST 19 07/22/2021 0252  ? ALT 11 07/22/2021 0252  ? ALKPHOS 57 07/22/2021 0252  ? BILITOT 2.6 (H) 07/22/2021 0252  ? BILITOT 0.9  06/14/2019 1122  ? GFRNONAA >60 07/22/2021 0252  ? GFRAA 75 06/14/2019 1122  ? ?Lipase  ?   ?Component Value Date/Time  ? LIPASE 25 07/21/2021 1107  ? ? ? ? ? ?Studies/Results: ?CT Angio Chest/Abd/Pel for Dissection W and/or Wo Contrast ? ?Result Date: 07/21/2021 ?CLINICAL DATA:  Acute aortic syndrome suspected. EXAM: CT ANGIOGRAPHY CHEST, ABDOMEN AND PELVIS TECHNIQUE: Non-contrast CT of the chest was initially obtained. Multidetector CT imaging through the chest, abdomen and pelvis was performed using the standard protocol during bolus administration of intravenous contrast. Multiplanar reconstructed images and MIPs were obtained and reviewed to evaluate the vascular anatomy. RADIATION DOSE REDUCTION: This exam was performed according to the departmental dose-optimization program which includes automated exposure control, adjustment of the mA and/or kV according to patient size and/or use of iterative reconstruction technique. CONTRAST:  187mL OMNIPAQUE IOHEXOL 350 MG/ML SOLN COMPARISON:  CT chest from 05/21/2021 and PET-CT from 02/25/2021. FINDINGS: CTA CHEST FINDINGS Cardiovascular: Preferential opacification of the thoracic aorta. No evidence of thoracic aortic aneurysm or dissection. Aortic atherosclerosis and coronary artery calcifications. Mild cardiac enlargement. No pericardial effusion. The main pulmonary artery appears patent.  No signs of a central obstructing pulmonary embolus. Mediastinum/Nodes: No enlarged supraclavicular, axillary, mediastinal or hilar lymph nodes. The thyroid gland, trachea and esophagus demonstrate no significant findings. Lungs/Pleura: There is no pleural effusion, airspace consolidation, or pneumothorax. Bandlike area of architectural distortion and fibrosis within the right upper lobe is again noted compatible with changes secondary to external beam radiation. Central nodular component within this area is again noted measuring 1 point 3 x 1.4 cm is stable from the previous exam.  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 Stable scarring within the left lower lobe. Musculoskeletal: Multiple remote healed left rib fractures are again seen in appears similar to previous exam. No acute or suspicious osseous findings identified. Review of the MIP images confirms the above findings. CTA ABDOMEN AND PELVIS FINDINGS VASCULAR Aorta: Normal caliber aorta without aneurysm, dissection, vasculitis or significant stenosis. Aortic atherosclerotic calcifications. Celiac: Patent without evidence of aneurysm, dissection, vasculitis or significant stenosis. SMA: Patent without evidence of aneurysm, dissection, vasculitis or significant stenosis. Renals: Both renal arteries are patent without evidence of aneurysm, dissection, vasculitis, fibromuscular dysplasia or significant stenosis. IMA: Patent without evidence of aneurysm, dissection, vasculitis or significant stenosis. Inflow: Patent without evidence of aneurysm, dissection, vasculitis or significant stenosis. Veins: No obvious venous abnormality within the limitations of this arterial phase study. Review of the MIP images confirms the above findings. NON-VASCULAR Hepatobiliary: No suspicious liver lesions. Several scattered subcentimeter low-density liver foci are technically too small to characterize small stones are identified within the dependent portion of the gallbladder. There is mild hazy soft tissue stranding around the gallbladder. No signs of bile duct dilatation. Pancreas: Unremarkable. No pancreatic ductal dilatation or surrounding inflammatory changes. Spleen: Normal in size without focal abnormality. Adrenals/Urinary Tract: Bilateral Bosniak class 1 and 2 kidney cysts are identified. The largest arises off the upper pole of the right kidney measuring 3.1 cm. No follow-up recommended. No signs of hydronephrosis bilaterally. Urinary bladder is unremarkable. Stomach/Bowel:  Small hiatal hernia is identified. The appendix is visualized and appears normal. No small bowel wall thickening, inflammation or distension. There is a ventral abdominal wall hernia which contains a nonobstructed loop of small bowel, image 256/6. There is mild wall thickening involving the ascending colon adjacent to the gallbladder, image 70/9. Extensive sigmoid diverticulosis without signs of acute diverticulitis. Lymphatic: No signs of abdominopelvic adenopathy. Reproductive: Prostate gland is not visualized compatible with prior prostatectomy. Other: No significant free fluid or fluid collections identified. No signs of pneumoperitoneum. Musculoskeletal: No acute or significant osseous findings. Review of the MIP images confirms the above findings. IMPRESSION: 1. No evidence for aortic dissection or aneurysm. 2. Tiny layering gallstones. Mild hazy soft tissue stranding around the gallbladder. Cannot exclude gallbladder wall inflammation due to cholecystitis. Consider further evaluation with right upper quadrant sonogram to assess for underlying gallbladder inflammation. 3. Short segment of mild wall thickening involving the ascending colon where it abuts the gallbladder. If there are signs of gallbladder inflammation on right upper quadrant sonogram thin this may represent secondary inflammation of the colon. Alternatively correlate for any clinical signs or symptoms of colitis. 4. Ventral abdominal wall hernia containing a nonobstructed loop of small bowel. 5. Extensive sigmoid diverticulosis without signs of acute diverticulitis. 6. Stable post radiation change within the right upper lobe with stable treated Lung nodule. 7. Prostatectomy. 8. Small hiatal hernia. 9. Aortic Atherosclerosis (ICD10-I70.0). Electronically Signed   By: Kerby Moors M.D.   On: 07/21/2021 12:11  ? ?  US Abdomen Limited RUQ (LIVER/GB) ? ?Result Date: 07/21/2021 ?CLINICAL DATA:  Pain right upper quadrant EXAM: ULTRASOUND ABDOMEN LIMITED  RIGHT UPPER QUADRANT COMPARISON:  10/02/2016 FINDINGS: Gallbladder: Sludge is seen in the dependent portion of gallbladder lumen. There is no definite demonstrable gallbladder stone with acoustic shadowin

## 2021-07-22 NOTE — TOC Initial Note (Addendum)
Transition of Care (TOC) - Initial/Assessment Note  ? ? ?Patient Details  ?Name: [name] [name] ?MRN: [id number] ?Date of Birth: 20-Nov-1940 ? ?Transition of Care (TOC) CM/SW Contact:    ?Tom-Johnson, Renea Ee, RN ?Phone Number: ?07/22/2021, 12:38 PM ? ?Clinical Narrative:                 ? ?CM spoke with patient at bedside about needs for post hospital transition. Admitted for Acute Cholecystitis. Scheduled for Laparoscopic Cholecystectomy with possible Intraoperative Cholangiogram today.  ?From home with spouse. Independent with care and drive self prior to admission. Has three supportive adult children.  ?Has a cane, walker, wheelchair, Home Oxygen and shower seat at home.  Oxygen supplies from [facility].  ?PCP is Gaynelle Arabian, MD and uses [facility] and also uses [facility] for mail order prescriptions.  ?No PT/OT recommendations noted and patient denies any needs at this time.  ?Family to transport at discharge. CM will continue to follow with needs.  ? ?Expected Discharge Plan: Home/Self Care ?Barriers to Discharge: Continued Medical Work up ? ? ?Patient Goals and CMS Choice ?Patient states their goals for this hospitalization and ongoing recovery are:: To return home ?CMS Medicare.gov Compare Post Acute Care list provided to:: Patient ?Choice offered to / list presented to : NA ? ?Expected Discharge Plan and Services ?Expected Discharge Plan: Home/Self Care ?  ?Discharge Planning Services: CM Consult ?Post Acute Care Choice: NA ?Living arrangements for the past 2 months: [place] ?                ?DME Arranged: N/A ?DME Agency: NA ?  ?  ?  ?  ?  ?  ?  ?  ? ?Prior Living Arrangements/Services ?Living arrangements for the past 2 months: [place] ?Lives with:: Spouse ?Patient language and need for interpreter reviewed:: Yes ?Do you feel safe going back to the place where you live?: Yes      ?Need for Family Participation in Patient Care: Yes (Comment) ?Care giver  support system in place?: Yes (comment) ?Current home services: DME (Cane, walker, shower seat, wheelchair) ?Criminal Activity/Legal Involvement Pertinent to Current Situation/Hospitalization: No - Comment as needed ? ?Activities of Daily Living ?Home Assistive Devices/Equipment: Dentures (specify type), Oxygen (lower dentures) ?ADL Screening (condition at time of admission) ?Patient's cognitive ability adequate to safely complete daily activities?: Yes ?Is the patient deaf or have difficulty hearing?: No ?Does the patient have difficulty seeing, even when wearing glasses/contacts?: No ?Does the patient have difficulty concentrating, remembering, or making decisions?: No ?Patient able to express need for assistance with ADLs?: Yes ?Does the patient have difficulty dressing or bathing?: No ?Independently performs ADLs?: Yes (appropriate for developmental age) ?Does the patient have difficulty walking or climbing stairs?: No ?Weakness of Legs: Both ?Weakness of Arms/Hands: None ? ?Permission Sought/Granted ?Permission sought to share information with : Case Manager, Family Supports ?Permission granted to share information with : Yes, Verbal Permission Granted ?   ?   ?   ?   ? ?Emotional Assessment ?Appearance:: Appears stated age ?Attitude/Demeanor/Rapport: Engaged, Gracious ?Affect (typically observed): Accepting, Appropriate, Calm, Hopeful ?Orientation: : Oriented to Self, Oriented to Place, Oriented to  Time, Oriented to Situation ?Alcohol / Substance Use: Not Applicable ?Psych Involvement: No (comment) ? ?Admission diagnosis:  Acute cholecystitis [K81.0] ?Cholecystitis [K81.9] ?Nausea [R11.0] ?Generalized abdominal pain [R10.84] ?Patient Active Problem List  ? Diagnosis Date Noted  ? Acute cholecystitis 07/21/2021  ? Prolonged QT interval 08/12/2020  ?  H/O adenomatous polyp of colon 06/14/2019  ? Malignant neoplasm of upper lobe of right lung ([place]) 01/18/2019  ? Constipation   ? GERD (gastroesophageal reflux  disease) 02/27/2017  ? CKD (chronic kidney disease), stage III ([place]) 02/27/2017  ? Anxiety 10/30/2015  ? Dyspnea 08/20/2015  ? Solitary pulmonary nodule 05/18/2015  ? OSA (obstructive sleep apnea) 04/11/2015  ? Chronic anticoagulation 03/17/2015  ? Asthma, chronic obstructive, with acute exacerbation ([place]) 02/26/2015  ? PAF (paroxysmal atrial fibrillation) ([place]) 01/26/2015  ? Actinic keratoses 11/23/2014  ? 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 Osteoarthritis of knee 04/23/2011  ? Hypertension 04/23/2011  ? History of prostate cancer 04/23/2011  ? Melanoma ([place]) 04/23/2011  ? Hiatal hernia 04/23/2011  ? Asthma 04/23/2011  ? Obesity, Class II, BMI 35-39.9, with comorbidity ([place]) 04/23/2011  ? ?PCP:  Gaynelle Arabian, MD ?Pharmacy:   ?[facility] [address] ?[facility] [place] [address] ?Phone: [phone] Fax: [phone] ? ?[facility] ?[address] ?[place] [address] ?Phone: [phone] Fax: [phone] ? ?[facility] ([facility] ) - [place] ?[address] ?[place] [address] ?Phone: [phone] Fax: [phone] ? ? ? ? ?Social Determinants of Health (SDOH) Interventions ?  ? ?Readmission Risk Interventions ?   ? View : No data to display.  ?  ?  ?  ? ? ? ?

## 2021-07-22 NOTE — Anesthesia Preprocedure Evaluation (Addendum)
Anesthesia Evaluation  ?Patient identified by MRN, date of birth, ID band ?Patient awake ? ? ? ?Reviewed: ?Allergy & Precautions, NPO status , Patient's Chart, lab work & pertinent test results ? ?History of Anesthesia Complications ?Negative for: history of anesthetic complications ? ?Airway ?Mallampati: II ? ?TM Distance: >3 FB ?Neck ROM: Full ? ? ? Dental ?no notable dental hx. ? ?  ?Pulmonary ?asthma , sleep apnea , COPD,  COPD inhaler, former smoker,  ?  ?Pulmonary exam normal ? ? ? ? ? ? ? Cardiovascular ?hypertension, +CHF  ?Normal cardiovascular exam+ dysrhythmias Atrial Fibrillation  ? ? ?  ?Neuro/Psych ?negative neurological ROS ? negative psych ROS  ? GI/Hepatic ?Neg liver ROS, hiatal hernia, GERD  ,Acute cholecystitis ?  ?Endo/Other  ?Morbid obesity ? Renal/GU ?Renal InsufficiencyRenal disease  ?negative genitourinary ?  ?Musculoskeletal ? ?(+) Arthritis ,  ? Abdominal ?  ?Peds ? Hematology ?negative hematology ROS ?(+)   ?Anesthesia Other Findings ?Day of surgery medications reviewed with patient. ? Reproductive/Obstetrics ?negative OB ROS ? ?  ? ? ? ? ? ? ? ? ? ? ? ? ? ?  ?  ? ? ? ? ? ? ? ?Anesthesia Physical ?Anesthesia Plan ? ?ASA: 3 ? ?Anesthesia Plan: General  ? ?Post-op Pain Management: Tylenol PO (pre-op)*  ? ?Induction: Intravenous ? ?PONV Risk Score and Plan: 3 and Treatment may vary due to age or medical condition, Ondansetron and Dexamethasone ? ?Airway Management Planned: Oral ETT ? ?Additional Equipment: None ? ?Intra-op Plan:  ? ?Post-operative Plan: Extubation in OR ? ?Informed Consent: I have reviewed the patients History and Physical, chart, labs and discussed the procedure including the risks, benefits and alternatives for the proposed anesthesia with the patient or authorized representative who has indicated his/her understanding and acceptance.  ? ? ? ?Dental advisory given ? ?Plan Discussed with: CRNA ? ?Anesthesia Plan Comments:    ? ? ? ? ? ?Anesthesia Quick Evaluation ? ?

## 2021-07-22 NOTE — Anesthesia Postprocedure Evaluation (Signed)
Anesthesia Post Note ? ?Patient: [name] [name] ? ?Procedure(s) Performed: LAPAROSCOPIC CHOLECYSTECTOMY (Abdomen) ?INDOCYANINE GREEN FLUORESCENCE IMAGING (ICG) (Abdomen) ? ?  ? ?Patient location during evaluation: PACU ?Anesthesia Type: General ?Level of consciousness: awake and alert ?Pain management: pain level controlled ?Vital Signs Assessment: post-procedure vital signs reviewed and stable ?Respiratory status: spontaneous breathing, nonlabored ventilation and respiratory function stable ?Cardiovascular status: blood pressure returned to baseline ?Postop Assessment: no apparent nausea or vomiting ?Anesthetic complications: no ? ? ?No notable events documented. ?  ? ?Marthenia Rolling ? ? ? ? ?

## 2021-07-22 NOTE — Discharge Instructions (Signed)
[facility]  Please arrive at least 30 min before your appointment to complete your check in paperwork.  If you are unable to arrive 30 min prior to your appointment time we may have to cancel or reschedule you. LAPAROSCOPIC SURGERY: POST OP INSTRUCTIONS Always review your discharge instruction sheet given to you by the facility where your surgery was performed. IF YOU HAVE DISABILITY OR FAMILY LEAVE FORMS, YOU MUST BRING THEM TO THE OFFICE FOR PROCESSING.   DO NOT GIVE THEM TO YOUR DOCTOR.  PAIN CONTROL  First take acetaminophen (Tylenol) AND/or ibuprofen (Advil) to control your pain after surgery.  Follow directions on package.  Taking acetaminophen (Tylenol) and/or ibuprofen (Advil) regularly after surgery will help to control your pain and lower the amount of prescription pain medication you may need.  You should not take more than 4,000 mg (4 grams) of acetaminophen (Tylenol) in 24 hours.  You should not take ibuprofen (Advil), aleve, motrin, naprosyn or other NSAIDS if you have a history of stomach ulcers or chronic kidney disease.  A prescription for pain medication may be given to you upon discharge.  Take your pain medication as prescribed, if you still have uncontrolled pain after taking acetaminophen (Tylenol) or ibuprofen (Advil). Use ice packs to help control pain. If you need a refill on your pain medication, please contact your pharmacy.  They will contact our office to request authorization. Prescriptions will not be filled after 5pm or on week-ends.  HOME MEDICATIONS Take your usually prescribed medications unless otherwise directed.  DIET You should follow a light diet the first few days after arrival home.  Be sure to include lots of fluids daily. Avoid fatty, fried foods.   CONSTIPATION It is common to experience some constipation after surgery and if you are taking pain medication.  Increasing fluid intake and taking a stool softener (such as Colace)  will usually help or prevent this problem from occurring.  A mild laxative (Milk of Magnesia or Miralax) should be taken according to package instructions if there are no bowel movements after 48 hours.  WOUND/INCISION CARE Most patients will experience some swelling and bruising in the area of the incisions.  Ice packs will help.  Swelling and bruising can take several days to resolve.  Unless discharge instructions indicate otherwise, follow guidelines below  STERI-STRIPS - you may remove your outer bandages 48 hours after surgery, and you may shower at that time.  You have steri-strips (small skin tapes) in place directly over the incision.  These strips should be left on the skin for 7-10 days.   DERMABOND/SKIN GLUE - you may shower in 24 hours.  The glue will flake off over the next 2-3 weeks. Any sutures or staples will be removed at the office during your follow-up visit.  ACTIVITIES You may resume regular (light) daily activities beginning the next day--such as daily self-care, walking, climbing stairs--gradually increasing activities as tolerated.  You may have sexual intercourse when it is comfortable.  Refrain from any heavy lifting or straining until approved by your doctor. You may drive when you are no longer taking prescription pain medication, you can comfortably wear a seatbelt, and you can safely maneuver your car and apply brakes.  FOLLOW-UP You should see your doctor in the office for a follow-up appointment approximately 2-3 weeks after your surgery.  You should have been given your post-op/follow-up appointment when your surgery was scheduled.  If you did not receive a post-op/follow-up appointment, make sure   that you call for this appointment within a day or two after you arrive home to insure a convenient appointment time.   WHEN TO CALL YOUR DOCTOR: Fever over 101.0 Inability to urinate Continued bleeding from incision. Increased pain, redness, or drainage from the  incision. Increasing abdominal pain  The clinic staff is available to answer your questions during regular business hours.  Please don't hesitate to call and ask to speak to one of the nurses for clinical concerns.  If you have a medical emergency, go to the nearest emergency room or call 911.  A surgeon from [facility] is always on call at the hospital. [address], [address], [place], [place]  [address] ? [address] [address], [place], [place]   [address] [phone] ? [phone] ? FAX [phone]  

## 2021-07-22 NOTE — Op Note (Addendum)
Preoperative diagnosis: Acute cholecystitis ?Postoperative diagnosis: gangrenous cholecystitis ?Procedure: Laparoscopic cholecystectomy, ICG dye ?Surgeon: Dr. Serita Grammes ?Asst: Saverio Danker, PA-C ?Anesthesia: General ?Estimated blood loss: Minimal ?Specimens: Gallbladder to pathology ?Drains: None ?Complications: None ?Sponge needle count was correct completion ?Decision to recovery stable condition ? ?Indications: 54 yom with elevated wbc, [place] with gallstones and cholecysititis on exam.  Has been off eliquis since Saturday night.  He was very tender on exam. I discussed proceeding with lap chole today.   ? ?Procedure: After informed consent was obtained the patient was taken to the operating room.  He was given antibiotics.  SCDs were in place.  He was given 2.5 mg of indocyanine green by IV.  He had SCDs in place.  He was placed under general anesthesia without complication.  He was prepped and draped in the standard sterile surgical fashion. ? ?I infiltrated Marcaine above the umbilicus and made an incision. I then grasped the fascia and incised it sharply. Peritoneum was entered bluntly.  I then placed a 0 vicryl pursestring suture and inserted a hasson trocar. The abdomen was insufflated to 15 mm Hg pressure.  I then inserted 3 additional 5 mm trocars in the epigastrium and right side of the abdomen.  The gallbladder was then identified.  He had gangrenous cholecystitis.  It was retracted cephalad and lateral.  I had to aspirate the gallbladder just to grab it.  I was able to dissect the triangle of Calot with a lot of difficulty due to the inflammation.  I then was able to identify the common bile duct as well as the common hepatic duct with the ICG dye confirming that I had the cystic duct.  I then obtained this critical view of safety clipped the cystic duct 3 times and divided it leaving 2 clips in place I treated the artery in a similar fashion. I then was able to remove the gallbladder from the liver  bed and placed it in a retrieval bag.  Hemostasis was obtained.  I irrigated until it was clear.   I then removed the gallbladder in the retrieval bag. I also sized up the epigastric trocar to 11 mm to evacuate the small stones that spilled.  I then removed the hasson trocar. I tied the pursestring down.  I also closed this trocar site using the suture passer and 0 Vicryl suture times two.   This obliterated the defect.  I then removed the remaining trocars and desufflated the abdomen.  These were closed with 4-0 Monocryl and glue.  He tolerated this well was extubated and transferred to recovery stable. ? ? ? ? ?

## 2021-07-22 NOTE — Progress Notes (Signed)
Wedding band and dentures given to wife Fredderick Severance) before surgery.   ?

## 2021-07-22 NOTE — Progress Notes (Signed)
?PROGRESS NOTE ? ? ? ?CARLYN MULLENBACH  IWL:[id number] DOB: Jul 17, 1940 DOA: 07/21/2021 ?PCP: Gaynelle Arabian, MD ? ? ?Brief Narrative:  ?JAYDEEN ODOR is a 81 y.o. male with medical history significant of atrial fibrillation, COPD, hx of prostate cancer s/p prostatectomy, hx of lung cancer and melanoma, CKD stage 3, GERD, moderate pulmonary HTN, HTN, OSA who presented to ED from urgent care with complaints of abdominal pain associated nausea and one episode of vomiting.  Upon arrival to ED, he was hemodynamically stable, afebrile.  He was diagnosed with acute cholecystitis.  Seen by general surgery but admitted under hospitalist service. ? ?Assessment & Plan: ?  ?Principal Problem: ?  Acute cholecystitis ?Active Problems: ?  PAF (paroxysmal atrial fibrillation) ([place]) ?  Prolonged QT interval ?  Hypertension ?  CKD (chronic kidney disease), stage III ([place]) ?  OSA (obstructive sleep apnea) ?  GERD (gastroesophageal reflux disease) ? ?Acute acalculous cholecystitis: Still with pain, slightly improved compared to yesterday.  General surgery on board.  Hemodynamically stable.  Patient is scheduled for surgery either later today or tomorrow morning.  He remains on Zosyn.  Patient NPO. ?  ?PAF (paroxysmal atrial fibrillation) ([place]): In normal sinus rhythm.  Continue flecainide but holding Eliquis for potential surgery.  Monitor on telemetry. ? ?Prolonged QT interval: Monitor on telemetry.  Keep magnesium over 2 and potassium over 4.  Avoid QT prolonging medications. ? ?Hypertension: Better controlled. ?Continue home medication of micardis 80mg  daily and lasix ?Hydralazine prn and pain control  ?  ?CKD (chronic kidney disease), stage IIIa: At baseline. ?  ?OSA (obstructive sleep apnea) ?Does not use cpap, wife states they were told he didn't need this.  ?  ?GERD (gastroesophageal reflux disease) ?Continue PPI  ? ?DVT prophylaxis: SCDs Start: 07/21/21 1727 ?  Code Status: Full Code  ?Family Communication:  None present at  bedside.  Plan of care discussed with patient in length and he/she verbalized understanding and agreed with it. ? ?Status is: Inpatient ?Remains inpatient appropriate because: Patient is scheduled for laparoscopic cholecystectomy. ? ? ?Estimated body mass index is 39.94 kg/m? as calculated from the following: ?  Height as of this encounter: 5\' 7"  (1.702 m). ?  Weight as of this encounter: 115.7 kg. ? ?  ?Nutritional Assessment: ?Body mass index is 39.94 kg/m?Marland KitchenMarland Kitchen ?Seen by dietician.  I agree with the assessment and plan as outlined below: ?Nutrition Status: ?  ?  ?  ? ?. ?Skin Assessment: ?I have examined the patient's skin and I agree with the wound assessment as performed by the wound care RN as outlined below: ?  ? ?Consultants:  ?General surgery ? ?Procedures:  ?As above ? ?Antimicrobials:  ?Anti-infectives (From admission, onward)  ? ? Start     Dose/Rate Route Frequency Ordered Stop  ? 07/22/21 1600  cefTRIAXone (ROCEPHIN) 2 g in sodium chloride 0.9 % 100 mL IVPB  Status:  Discontinued       ? 2 g ?200 mL/hr over 30 Minutes Intravenous Every 24 hours 07/21/21 1729 07/22/21 0929  ? 07/22/21 1400  piperacillin-tazobactam (ZOSYN) IVPB 3.375 g       ? 3.375 g ?12.5 mL/hr over 240 Minutes Intravenous Every 8 hours 07/22/21 0929    ? 07/21/21 1615  cefTRIAXone (ROCEPHIN) 1 g in sodium chloride 0.9 % 100 mL IVPB       ? 1 g ?200 mL/hr over 30 Minutes Intravenous  Once 07/21/21 1614 07/21/21 1746  ? 07/21/21 1615  metroNIDAZOLE (FLAGYL) IVPB 500  mg  Status:  Discontinued       ? 500 mg ?100 mL/hr over 60 Minutes Intravenous Every 12 hours 07/21/21 1614 07/22/21 0929  ? ?  ?  ? ? ?Subjective: ?Seen and examined.  Still with abdominal pain.  No new complaint. ? ?Objective: ?Vitals:  ? 07/22/21 0100 07/22/21 0210 07/22/21 0218 07/22/21 7619  ?BP: 137/65  (!) 155/59 (!) 123/57  ?Pulse: 83  82 82  ?Resp: (!) 22  19 16   ?Temp: 99.9 ?F (37.7 ?C)  98.9 ?F (37.2 ?C) 98.5 ?F (36.9 ?C)  ?TempSrc: Oral  Oral Oral  ?SpO2: 96%  94%  94%  ?Weight:  115.7 kg    ?Height:  5\' 7"  (1.702 m)    ? ?No intake or output data in the 24 hours ending 07/22/21 1001 ?Filed Weights  ? 07/22/21 0210  ?Weight: 115.7 kg  ? ? ?Examination: ? ?General exam: Appears calm and comfortable, obese ?Respiratory system: Clear to auscultation. Respiratory effort normal. ?Cardiovascular system: S1 & S2 heard, RRR. No JVD, murmurs, rubs, gallops or clicks. No pedal edema. ?Gastrointestinal system: Abdomen is distended, slightly firm and generalized tenderness, more pronounced at right upper quadrant. ?Central nervous system: Alert and oriented. No focal neurological deficits. ?Extremities: Symmetric 5 x 5 power. ?Skin: No rashes, lesions or ulcers ?Psychiatry: Judgement and insight appear normal. Mood & affect appropriate.  ? ? ?Data Reviewed: I have personally reviewed following labs and imaging studies ? ?CBC: ?Recent Labs  ?Lab 07/21/21 ?1107 07/21/21 ?1126 07/22/21 ?0252  ?WBC 12.3*  --  15.0*  ?NEUTROABS 10.6*  --   --   ?HGB 13.6 13.6 14.0  ?HCT 40.8 40.0 40.8  ?MCV 91.9  --  90.5  ?PLT 154  --  139*  ? ?Basic Metabolic Panel: ?Recent Labs  ?Lab 07/21/21 ?1107 07/21/21 ?1126 07/22/21 ?0252  ?NA 142 141 137  ?K 3.7 3.6 3.8  ?CL 109 105 104  ?CO2 26  --  25  ?GLUCOSE 126* 127* 131*  ?BUN 14 16 11   ?CREATININE 1.18 1.20 1.17  ?CALCIUM 9.0  --  8.7*  ?MG 2.1  --   --   ? ?GFR: ?Estimated Creatinine Clearance: 58.1 mL/min (by C-G formula based on SCr of 1.17 mg/dL). ?Liver Function Tests: ?Recent Labs  ?Lab 07/21/21 ?1107 07/22/21 ?0252  ?AST 19 19  ?ALT 10 11  ?ALKPHOS 54 57  ?BILITOT 1.6* 2.6*  ?PROT 6.3* 6.0*  ?ALBUMIN 3.8 3.5  ? ?Recent Labs  ?Lab 07/21/21 ?1107  ?LIPASE 25  ? ?No results for input(s): AMMONIA in the last 168 hours. ?Coagulation Profile: ?Recent Labs  ?Lab 07/21/21 ?1107  ?INR 1.3*  ? ?Cardiac Enzymes: ?No results for input(s): CKTOTAL, CKMB, CKMBINDEX, TROPONINI in the last 168 hours. ?BNP (last 3 results) ?No results for input(s): PROBNP in the last  8760 hours. ?HbA1C: ?No results for input(s): HGBA1C in the last 72 hours. ?CBG: ?No results for input(s): GLUCAP in the last 168 hours. ?Lipid Profile: ?No results for input(s): CHOL, HDL, LDLCALC, TRIG, CHOLHDL, LDLDIRECT in the last 72 hours. ?Thyroid Function Tests: ?No results for input(s): TSH, T4TOTAL, FREET4, T3FREE, THYROIDAB in the last 72 hours. ?Anemia Panel: ?No results for input(s): VITAMINB12, FOLATE, FERRITIN, TIBC, IRON, RETICCTPCT in the last 72 hours. ?Sepsis Labs: ?No results for input(s): PROCALCITON, LATICACIDVEN in the last 168 hours. ? ?No results found for this or any previous visit (from the past 240 hour(s)).  ? ?Radiology Studies: ?CT Angio Chest/Abd/Pel for Dissection W and/or Wo  Contrast ? ?Result Date: 07/21/2021 ?CLINICAL DATA:  Acute aortic syndrome suspected. EXAM: CT ANGIOGRAPHY CHEST, ABDOMEN AND PELVIS TECHNIQUE: Non-contrast CT of the chest was initially obtained. Multidetector CT imaging through the chest, abdomen and pelvis was performed using the standard protocol during bolus administration of intravenous contrast. Multiplanar reconstructed images and MIPs were obtained and reviewed to evaluate the vascular anatomy. RADIATION DOSE REDUCTION: This exam was performed according to the departmental dose-optimization program which includes automated exposure control, adjustment of the mA and/or kV according to patient size and/or use of iterative reconstruction technique. CONTRAST:  179mL OMNIPAQUE IOHEXOL 350 MG/ML SOLN COMPARISON:  CT chest from 05/21/2021 and PET-CT from 02/25/2021. FINDINGS: CTA CHEST FINDINGS Cardiovascular: Preferential opacification of the thoracic aorta. No evidence of thoracic aortic aneurysm or dissection. Aortic atherosclerosis and coronary artery calcifications. Mild cardiac enlargement. No pericardial effusion. The main pulmonary artery appears patent. No signs of a central obstructing pulmonary embolus. Mediastinum/Nodes: No enlarged supraclavicular,  axillary, mediastinal or hilar lymph nodes. The thyroid gland, trachea and esophagus demonstrate no significant findings. Lungs/Pleura: There is no pleural effusion, airspace consolidation, or pneumot

## 2021-07-22 NOTE — Progress Notes (Signed)
Mobility Specialist: Progress Note ? ? 07/22/21 1216  ?Mobility  ?Activity Ambulated with assistance in hallway  ?Level of Assistance Modified independent, requires aide device or extra time  ?Assistive Device Other (Comment) ?(IV pole)  ?Distance Ambulated (ft) 180 ft  ?Activity Response Tolerated well  ?$Mobility charge 1 Mobility  ? ?Post-Mobility: 94% SpO2 ? ?Pt received in the chair and agreeable to ambulation. C/o RUQ pain during session, otherwise asymptomatic. Pt back to chair after session with call bell and phone in reach. Family present in the room.  ? ?Harrell Gave Avarose Mervine ?Mobility Specialist ?Mobility Specialist [place]: [phone] ?Mobility Specialist [place]: [phone] ? ?

## 2021-07-22 NOTE — Anesthesia Procedure Notes (Signed)
Procedure Name: Intubation ?Date/Time: 07/22/2021 1:38 PM ?Performed by: Carolan Clines, CRNA ?Pre-anesthesia Checklist: Patient identified, Emergency Drugs available, Suction available and Patient being monitored ?Patient Re-evaluated:Patient Re-evaluated prior to induction ?Oxygen Delivery Method: Circle System Utilized ?Preoxygenation: Pre-oxygenation with 100% oxygen ?Induction Type: IV induction ?Ventilation: Mask ventilation without difficulty and Oral airway inserted - appropriate to patient size ?Laryngoscope Size: Mac and 4 ?Grade View: Grade I ?Tube type: Oral ?Tube size: 7.5 mm ?Number of attempts: 1 ?Airway Equipment and Method: Stylet and Oral airway ?Placement Confirmation: ETT inserted through vocal cords under direct vision, positive ETCO2 and breath sounds checked- equal and bilateral ?Secured at: 22 cm ?Tube secured with: Tape ?Dental Injury: Teeth and Oropharynx as per pre-operative assessment  ? ? ? ? ?

## 2021-07-23 ENCOUNTER — Encounter (HOSPITAL_COMMUNITY): Payer: Self-pay | Admitting: General Surgery

## 2021-07-23 DIAGNOSIS — K810 Acute cholecystitis: Secondary | ICD-10-CM | POA: Diagnosis not present

## 2021-07-23 LAB — CBC
HCT: 38.3 % — ABNORMAL LOW (ref 39.0–52.0)
Hemoglobin: 12.9 g/dL — ABNORMAL LOW (ref 13.0–17.0)
MCH: 30.6 pg (ref 26.0–34.0)
MCHC: 33.7 g/dL (ref 30.0–36.0)
MCV: 90.8 fL (ref 80.0–100.0)
Platelets: 125 10*3/uL — ABNORMAL LOW (ref 150–400)
RBC: 4.22 MIL/uL (ref 4.22–5.81)
RDW: 13.4 % (ref 11.5–15.5)
WBC: 14.5 10*3/uL — ABNORMAL HIGH (ref 4.0–10.5)
nRBC: 0 % (ref 0.0–0.2)

## 2021-07-23 LAB — COMPREHENSIVE METABOLIC PANEL
ALT: UNDETERMINED U/L (ref 0–44)
AST: 31 U/L (ref 15–41)
Albumin: 2.8 g/dL — ABNORMAL LOW (ref 3.5–5.0)
Alkaline Phosphatase: 47 U/L (ref 38–126)
Anion gap: 9 (ref 5–15)
BUN: 17 mg/dL (ref 8–23)
CO2: 23 mmol/L (ref 22–32)
Calcium: 8.3 mg/dL — ABNORMAL LOW (ref 8.9–10.3)
Chloride: 106 mmol/L (ref 98–111)
Creatinine, Ser: 1.33 mg/dL — ABNORMAL HIGH (ref 0.61–1.24)
GFR, Estimated: 53 mL/min — ABNORMAL LOW (ref >60)
Glucose, Bld: 183 mg/dL — ABNORMAL HIGH (ref 70–99)
Potassium: 4 mmol/L (ref 3.5–5.1)
Sodium: 138 mmol/L (ref 135–145)
Total Bilirubin: UNDETERMINED mg/dL (ref 0.3–1.2)
Total Protein: 5.6 g/dL — ABNORMAL LOW (ref 6.5–8.1)

## 2021-07-23 LAB — SURGICAL PATHOLOGY

## 2021-07-23 MED ORDER — ACETAMINOPHEN 500 MG PO TABS
1000.0000 mg | ORAL_TABLET | Freq: Four times a day (QID) | ORAL | Status: DC
  Administered 2021-07-23 – 2021-07-24 (×5): 1000 mg via ORAL
  Filled 2021-07-23 (×5): qty 2

## 2021-07-23 MED ORDER — AMOXICILLIN-POT CLAVULANATE 875-125 MG PO TABS
1.0000 | ORAL_TABLET | Freq: Two times a day (BID) | ORAL | Status: DC
  Administered 2021-07-24: 1 via ORAL
  Filled 2021-07-23: qty 1

## 2021-07-23 NOTE — Progress Notes (Signed)
?PROGRESS NOTE ? ? ? ?[name] [name]  JJO:[id number] DOB: 03-Jun-1940 DOA: 07/21/2021 ?PCP: Gaynelle Arabian, MD ? ? ?Brief Narrative:  ?[name] [name] is a 81 y.o. male with medical history significant of atrial fibrillation, COPD, hx of prostate cancer s/p prostatectomy, hx of lung cancer and melanoma, CKD stage 3, GERD, moderate pulmonary HTN, HTN, OSA who presented to ED from urgent care with complaints of abdominal pain associated nausea and one episode of vomiting.  Upon arrival to ED, he was hemodynamically stable, afebrile.  He was diagnosed with acute cholecystitis.  Seen by general surgery but admitted under hospitalist service. ? ?Assessment & Plan: ?  ?Principal Problem: ?  Acute cholecystitis ?Active Problems: ?  PAF (paroxysmal atrial fibrillation) ([place]) ?  Prolonged QT interval ?  Hypertension ?  CKD (chronic kidney disease), stage III ([place]) ?  OSA (obstructive sleep apnea) ?  GERD (gastroesophageal reflux disease) ? ?Acute acalculous cholecystitis: S/p laparoscopic cholecystectomy 07/22/2021.  Slightly symptomatic last night with nausea and vomiting.  Still not feeling well but overall better than last night.  Seen by general surgery, they recommend keeping him overnight, potentially plan to discharge tomorrow.  He was on Zosyn, looks like general surgery has transitioned him to Augmentin starting tomorrow. ?  ?PAF (paroxysmal atrial fibrillation) ([place]): In normal sinus rhythm.  Continue flecainide but holding Eliquis per general surgery recommendations and they have cleared him to resume Eliquis tomorrow. ? ?Acute thrombocytopenia: Likely secondary to infection.  Platelets slightly down today but no bleeding.  Monitor. ? ?Prolonged QT interval: Monitor on telemetry.  Keep magnesium over 2 and potassium over 4.  Avoid QT prolonging medications. ? ?Hypertension: Better controlled. ?Continue home medications.  ?  ?CKD (chronic kidney disease), stage IIIa: At baseline. ?  ?OSA (obstructive sleep  apnea) ?Does not use cpap, wife states they were told he didn't need this.  ?  ?GERD (gastroesophageal reflux disease) ?Continue PPI  ? ?DVT prophylaxis: SCDs Start: 07/21/21 1727 ?  Code Status: Full Code  ?Family Communication:  None present at bedside.  Plan of care discussed with patient in length and he/she verbalized understanding and agreed with it. ? ?Status is: Inpatient ?Remains inpatient appropriate because: Still slightly symptomatic, per general surgery, needs to stay overnight, plan for discharge tomorrow. ? ? ?Estimated body mass index is 39.95 kg/m? as calculated from the following: ?  Height as of this encounter: 5\' 7"  (1.601 m). ?  Weight as of this encounter: 115.7 kg. ? ?  ?Nutritional Assessment: ?Body mass index is 39.95 kg/m?Marland KitchenMarland Kitchen ?Seen by dietician.  I agree with the assessment and plan as outlined below: ?Nutrition Status: ?  ?  ?  ? ?. ?Skin Assessment: ?I have examined the patient's skin and I agree with the wound assessment as performed by the wound care RN as outlined below: ?  ? ?Consultants:  ?General surgery ? ?Procedures:  ?As above ? ?Antimicrobials:  ?Anti-infectives (From admission, onward)  ? ? Start     Dose/Rate Route Frequency Ordered Stop  ? 07/22/21 1600  cefTRIAXone (ROCEPHIN) 2 g in sodium chloride 0.9 % 100 mL IVPB  Status:  Discontinued       ? 2 g ?200 mL/hr over 30 Minutes Intravenous Every 24 hours 07/21/21 1729 07/22/21 0929  ? 07/22/21 1400  piperacillin-tazobactam (ZOSYN) IVPB 3.375 g       ? 3.375 g ?12.5 mL/hr over 240 Minutes Intravenous Every 8 hours 07/22/21 0929 07/23/21 2359  ? 07/21/21 1615  cefTRIAXone (ROCEPHIN) 1  g in sodium chloride 0.9 % 100 mL IVPB       ? 1 g ?200 mL/hr over 30 Minutes Intravenous  Once 07/21/21 1614 07/21/21 1746  ? 07/21/21 1615  metroNIDAZOLE (FLAGYL) IVPB 500 mg  Status:  Discontinued       ? 500 mg ?100 mL/hr over 60 Minutes Intravenous Every 12 hours 07/21/21 1614 07/22/21 0929  ? ?  ?  ? ? ?Subjective: ? ?Seen and examined.  Has  some intermittent nausea.  Vomited last night.  Some abdominal pain which is improving.  No other complaint. ? ?Objective: ?Vitals:  ? 07/22/21 1551 07/22/21 1941 07/22/21 2122 07/23/21 0525  ?BP: (!) 142/70  128/65 132/61  ?Pulse: 89  90 80  ?Resp: 16  18 18   ?Temp: 98.2 ?F (36.8 ?C)  99.1 ?F (37.3 ?C) 98.3 ?F (36.8 ?C)  ?TempSrc: Oral   Oral  ?SpO2: 94% 95% 92% 95%  ?Weight:      ?Height:      ? ? ?Intake/Output Summary (Last 24 hours) at 07/23/2021 0804 ?Last data filed at 07/23/2021 0700 ?Gross per 24 hour  ?Intake 2647.39 ml  ?Output 425 ml  ?Net 2222.39 ml  ? ?Filed Weights  ? 07/22/21 0210 07/22/21 1250  ?Weight: 115.7 kg 115.7 kg  ? ? ?Examination: ? ?General exam: Appears calm and comfortable, obese ?Respiratory system: Clear to auscultation. Respiratory effort normal. ?Cardiovascular system: S1 & S2 heard, RRR. No JVD, murmurs, rubs, gallops or clicks. No pedal edema. ?Gastrointestinal system: Abdomen is moderately distended, slightly firm and tender at the right upper quadrant,. No organomegaly or masses felt. Normal bowel sounds heard. ?Central nervous system: Alert and oriented. No focal neurological deficits. ?Extremities: Symmetric 5 x 5 power. ?Skin: No rashes, lesions or ulcers.  ?Psychiatry: Judgement and insight appear normal. Mood & affect appropriate.  ? ? ?Data Reviewed: I have personally reviewed following labs and imaging studies ? ?CBC: ?Recent Labs  ?Lab 07/21/21 ?1107 07/21/21 ?1126 07/22/21 ?0252 07/23/21 ?2633  ?WBC 12.3*  --  15.0* 14.5*  ?NEUTROABS 10.6*  --   --   --   ?HGB 13.6 13.6 14.0 12.9*  ?HCT 40.8 40.0 40.8 38.3*  ?MCV 91.9  --  90.5 90.8  ?PLT 154  --  139* 125*  ? ? ?Basic Metabolic Panel: ?Recent Labs  ?Lab 07/21/21 ?1107 07/21/21 ?1126 07/22/21 ?0252  ?NA 142 141 137  ?K 3.7 3.6 3.8  ?CL 109 105 104  ?CO2 26  --  25  ?GLUCOSE 126* 127* 131*  ?BUN 14 16 11   ?CREATININE 1.18 1.20 1.17  ?CALCIUM 9.0  --  8.7*  ?MG 2.1  --   --   ? ? ?GFR: ?Estimated Creatinine Clearance: 58.1  mL/min (by C-G formula based on SCr of 1.17 mg/dL). ?Liver Function Tests: ?Recent Labs  ?Lab 07/21/21 ?1107 07/22/21 ?0252  ?AST 19 19  ?ALT 10 11  ?ALKPHOS 54 57  ?BILITOT 1.6* 2.6*  ?PROT 6.3* 6.0*  ?ALBUMIN 3.8 3.5  ? ? ?Recent Labs  ?Lab 07/21/21 ?1107  ?LIPASE 25  ? ? ?No results for input(s): AMMONIA in the last 168 hours. ?Coagulation Profile: ?Recent Labs  ?Lab 07/21/21 ?1107  ?INR 1.3*  ? ? ?Cardiac Enzymes: ?No results for input(s): CKTOTAL, CKMB, CKMBINDEX, TROPONINI in the last 168 hours. ?BNP (last 3 results) ?No results for input(s): PROBNP in the last 8760 hours. ?HbA1C: ?No results for input(s): HGBA1C in the last 72 hours. ?CBG: ?No results for input(s): GLUCAP in the  last 168 hours. ?Lipid Profile: ?No results for input(s): CHOL, HDL, LDLCALC, TRIG, CHOLHDL, LDLDIRECT in the last 72 hours. ?Thyroid Function Tests: ?No results for input(s): TSH, T4TOTAL, FREET4, T3FREE, THYROIDAB in the last 72 hours. ?Anemia Panel: ?No results for input(s): VITAMINB12, FOLATE, FERRITIN, TIBC, IRON, RETICCTPCT in the last 72 hours. ?Sepsis Labs: ?No results for input(s): PROCALCITON, LATICACIDVEN in the last 168 hours. ? ?No results found for this or any previous visit (from the past 240 hour(s)).  ? ?Radiology Studies: ?CT Angio Chest/Abd/Pel for Dissection W and/or Wo Contrast ? ?Result Date: 07/21/2021 ?CLINICAL DATA:  Acute aortic syndrome suspected. EXAM: CT ANGIOGRAPHY CHEST, ABDOMEN AND PELVIS TECHNIQUE: Non-contrast CT of the chest was initially obtained. Multidetector CT imaging through the chest, abdomen and pelvis was performed using the standard protocol during bolus administration of intravenous contrast. Multiplanar reconstructed images and MIPs were obtained and reviewed to evaluate the vascular anatomy. RADIATION DOSE REDUCTION: This exam was performed according to the departmental dose-optimization program which includes automated exposure control, adjustment of the mA and/or kV according to patient  size and/or use of iterative reconstruction technique. CONTRAST:  140mL OMNIPAQUE IOHEXOL 350 MG/ML SOLN COMPARISON:  CT chest from 05/21/2021 and PET-CT from 02/25/2021. FINDINGS: CTA CHEST FINDINGS Cardio

## 2021-07-23 NOTE — Progress Notes (Signed)
Occupational Therapy Evaluation ?Patient Details ?Name: [name] [name] ?MRN: [id number] ?DOB: 03/03/1941 ?Today's Date: 07/23/2021 ? ? ?History of Present Illness 81 y.o. male who presented to ED from urgent care with complaints of abdominal pain. PT with acute cholecystitis and underwent lap chole 5/15. Medical history significant of atrial fibrillation, COPD, hx of prostate cancer s/p prostatectomy, hx of lung cancer and melanoma, CKD stage 3, GERD, moderate pulmonary HTN, HTN, OSA  ? ?Clinical Impression ?  ?PTA pt lives independently with his wife. Pt currently independent with mobility and requires min A for LB ADL due to abdominal discomfort. Wife can assist as needed after DC. Educated on strategies to reduce risk of falls and energy conservation. Minimal SOB noted during activity, which pt states is normal for him. Educated on purssed lip breathing techniques. He has O2 at home to use "as needed". No further OT needed.   ?   ? ?Recommendations for follow up therapy are one component of a multi-disciplinary discharge planning process, led by the attending physician.  Recommendations may be updated based on patient status, additional functional criteria and insurance authorization.  ? ?Follow Up Recommendations ? No OT follow up  ?  ?Assistance Recommended at Discharge Intermittent Supervision/Assistance  ?Patient can return home with the following Assistance with cooking/housework ? ?  ?Functional Status Assessment ? Patient has not had a recent decline in their functional status  ?Equipment Recommendations ? None recommended by OT  ?  ?Recommendations for Other Services   ? ? ?  ?Precautions / Restrictions Precautions ?Precautions: Fall  ? ?  ? ?Mobility Bed Mobility ?  ?  ?  ?  ?  ?  ?  ?General bed mobility comments: OOB in chair ?  ? ?Transfers ?Overall transfer level: Modified independent ?  ?  ?  ?  ?  ?  ?  ?  ?  ?  ? ?  ?Balance Overall balance assessment: Mild deficits observed, not formally tested ?   ?  ?  ?  ?  ?  ?  ?  ?  ?  ?  ?  ?  ?  ?  ?  ?  ?  ?   ? ?ADL either performed or assessed with clinical judgement  ? ?ADL Overall ADL's : Needs assistance/impaired ?  ?  ?  ?  ?  ?  ?  ?  ?  ?  ?  ?  ?  ?  ?  ?  ?  ?  ?Functional mobility during ADLs: Modified independent ?General ADL Comments: Assistance required for LB ADL due to abdominal discomfort; wife will be able to assist aas needed; wears compression socks  ? ? ? ?Vision Baseline Vision/History: 0 No visual deficits ?   ?   ?Perception   ?  ?Praxis   ?  ? ?Pertinent Vitals/Pain Pain Assessment ?Pain Assessment: Faces ?Faces Pain Scale: Hurts a little bit ?Pain Location: belly ?Pain Descriptors / Indicators: Discomfort ?Pain Intervention(s): Limited activity within patient's tolerance  ? ? ? ?Hand Dominance Right ?  ?Extremity/Trunk Assessment Upper Extremity Assessment ?Upper Extremity Assessment: Overall WFL for tasks assessed ?  ?Lower Extremity Assessment ?Lower Extremity Assessment: Defer to PT evaluation ?  ?Cervical / Trunk Assessment ?Cervical / Trunk Assessment: Other exceptions (increased body habitus) ?  ?Communication Communication ?Communication: No difficulties ?  ?Cognition Arousal/Alertness: Awake/alert ?Behavior During Therapy: WFL for tasks assessed/performed (very talkative) ?Overall Cognitive Status: Within Functional Limits for tasks assessed ?  ?  ?  ?  ?  ?  ?  ?  ?  ?  ?  ?  ?  ?  ?  ?  ?  ?  ?  ?  General Comments    ? ?  ?Exercises Exercises: Other exercises ?Other Exercises ?Other Exercises: pursed lip bretahing techniques taught for SOB ?Other Exercises: incentive spriometer x 5 - able to pull @ 1700 ml ?  ?Shoulder Instructions    ? ? ?Home Living Family/patient expects to be discharged to:: Private residence ?Living Arrangements: Spouse/significant other ?Available Help at Discharge: Family ?Type of Home: House ?Home Access: Elevator ?  ?  ?Home Layout: One level ?  ?  ?Bathroom Shower/Tub: Walk-in shower ?  ?Bathroom Toilet:  Handicapped height ?Bathroom Accessibility: Yes ?  ?Home Equipment: Conservation officer, nature (2 wheels);Rollator (4 wheels);Cane - single point;Cane - quad;BSC/3in1;Shower seat;Wheelchair - manual;Wheelchair - power ?  ?Additional Comments: Doesn't use ADs; has lift chair at home that he sleeps in ?  ? ?  ?Prior Functioning/Environment Prior Level of Function : Independent/Modified Independent;Driving ?  ?  ?  ?  ?  ?  ?  ?  ?  ? ?  ?  ?OT Problem List: Decreased activity tolerance;Obesity;Pain ?  ?   ?OT Treatment/Interventions:    ?  ?OT Goals(Current goals can be found in the care plan section) Acute Rehab OT Goals ?Patient Stated Goal: to go home ?OT Goal Formulation: All assessment and education complete, DC therapy  ?OT Frequency:   ?  ? ?Co-evaluation   ?  ?  ?  ?  ? ?  ?AM-PAC OT "6 Clicks" Daily Activity     ?Outcome Measure Help from another person eating meals?: None ?Help from another person taking care of personal grooming?: None ?Help from another person toileting, which includes using toliet, bedpan, or urinal?: None ?Help from another person bathing (including washing, rinsing, drying)?: A Little ?Help from another person to put on and taking off regular upper body clothing?: None ?Help from another person to put on and taking off regular lower body clothing?: A Little ?6 Click Score: 22 ?  ?End of Session Equipment Utilized During Treatment: Gait belt ?Nurse Communication: Mobility status ? ?Activity Tolerance: Patient tolerated treatment well ?Patient left: in chair;with call bell/phone within reach ? ?OT Visit Diagnosis: Unsteadiness on feet (R26.81);Pain ?Pain - part of body:  (abdomen)  ?              ?Time: 0626-9485 ?OT Time Calculation (min): 26 min ?Charges:  OT General Charges ?$OT Visit: 1 Visit ?OT Evaluation ?$OT Eval Low Complexity: 1 Low ?OT Treatments ?$Self Care/Home Management : 8-22 mins ? ?[facility], OT/L  ? ?Acute OT Clinical Specialist ?Acute Rehabilitation Services ?Pager  [phone] ?Office [phone]  ? ?Skye Plamondon,HILLARY ?07/23/2021, 9:38 AM ?

## 2021-07-23 NOTE — Progress Notes (Signed)
Mobility Specialist: Progress Note ? ? 07/23/21 1459  ?Mobility  ?Activity Ambulated with assistance in hallway  ?Level of Assistance Modified independent, requires aide device or extra time  ?Assistive Device Other (Comment) ?(IV pole)  ?Distance Ambulated (ft) 140 ft  ?Activity Response Tolerated well  ?$Mobility charge 1 Mobility  ? ?Post-Mobility: 74 HR, 98% SpO2 ? ?Pt received in bed and agreeable to ambulation. Ambulation on 2 L/min . C/o 5/10 abdominal pain during session, otherwise asymptomatic. Pt back to bed after session with call bell and phone in reach.  ? ?[name] [name] ?Mobility Specialist ?Mobility Specialist [place]: [phone] ?Mobility Specialist [place]: [phone] ? ?

## 2021-07-23 NOTE — Evaluation (Signed)
Physical Therapy Evaluation ?Patient Details ?Name: [name] [name] ?MRN: [id number] ?DOB: Jun 03, 1940 ?Today's Date: 07/23/2021 ? ?History of Present Illness ? 81 y.o. male who presented to ED from urgent care with complaints of abdominal pain. PT with acute cholecystitis and underwent lap chole 5/15. Medical history significant of atrial fibrillation, COPD, hx of prostate cancer s/p prostatectomy, hx of lung cancer and melanoma, CKD stage 3, GERD, moderate pulmonary HTN, HTN, OSA ?  ?Clinical Impression ? Pt admitted with above. Pt very motivated to mobilize and return home. Pt functioning at min guard level with noted decreased activity tolerance and DOE with ambulation. Pt amb 180' with IV Pole yesterday with mobility team. Pt amb 100' today without O2 and then 120' with 2Lo2 pushing IV pole after 5 min rest break. See O2 saturations below. Suspect as pain subsides pt will improve activity tolerance. Acute PT to cont to follow. ? ?SATURATION QUALIFICATIONS: (This note is used to comply with regulatory documentation for home oxygen) ? ?Patient Saturations on Room Air at Rest = 94% ? ?Patient Saturations on Room Air while Ambulating = 85% ? ?Patient Saturations on 2 Liters of oxygen while Ambulating = 92% ? ?Please briefly explain why patient needs home oxygen:Pt unable to maintain SpO2 >88% on RA. ?   ? ?Recommendations for follow up therapy are one component of a multi-disciplinary discharge planning process, led by the attending physician.  Recommendations may be updated based on patient status, additional functional criteria and insurance authorization. ? ?Follow Up Recommendations No PT follow up ? ?  ?Assistance Recommended at Discharge Intermittent Supervision/Assistance  ?Patient can return home with the following ? A little help with bathing/dressing/bathroom;Assist for transportation;Help with stairs or ramp for entrance;A little help with walking and/or transfers ? ?  ?Equipment Recommendations None  recommended by PT  ?Recommendations for Other Services ?    ?  ?Functional Status Assessment Patient has had a recent decline in their functional status and demonstrates the ability to make significant improvements in function in a reasonable and predictable amount of time.  ? ?  ?Precautions / Restrictions Precautions ?Precautions: Fall ?Precaution Comments: watch SpO2 ?Restrictions ?Weight Bearing Restrictions: No  ? ?  ? ?Mobility ? Bed Mobility ?Overal bed mobility: Needs Assistance ?Bed Mobility: Supine to Sit ?  ?  ?Supine to sit: Min guard ?  ?  ?General bed mobility comments: HOB elevated to 50 deg, used bed rail to pull up from, no physical assist needed from PT, pt reports sleeping in a recliner at home ?  ? ?Transfers ?Overall transfer level: Modified independent ?Equipment used: None ?  ?  ?  ?  ?  ?  ?  ?General transfer comment: pushed up from bed or arm rests of chair ?  ? ?Ambulation/Gait ?Ambulation/Gait assistance: Min guard ?Gait Distance (Feet): 100 Feet (x1, 120x1) ?Assistive device: IV Pole ?Gait Pattern/deviations: Step-through pattern, Decreased stride length, Wide base of support ?Gait velocity: quicker when on 2LO2 ?  ?  ?General Gait Details: pt amb 100' without O2 pushing IV pole and then 120' with 2Lo2 via [place] and pushing IV pole. Pt with improved cadence and distance when on 2LO2 compared to RA. Pts SpO2 was at 85% at about 78' when amb without O2, pt able to make it back to within 10' of room prior to SpO2 dec to 88% on 2Lo2 via [place] ? ?Stairs ?  ?  ?  ?  ?  ? ?Wheelchair Mobility ?  ? ?Modified Rankin (Stroke Patients  Only) ?  ? ?  ? ?Balance Overall balance assessment: Mild deficits observed, not formally tested ?  ?  ?  ?  ?  ?  ?  ?  ?  ?  ?  ?  ?  ?  ?  ?  ?  ?  ?   ? ? ? ?Pertinent Vitals/Pain Pain Assessment ?Pain Assessment: Faces ?Faces Pain Scale: Hurts a little bit ?Pain Location: belly ?Pain Descriptors / Indicators: Discomfort ?Pain Intervention(s): Monitored during session   ? ? ?Home Living Family/patient expects to be discharged to:: Private residence ?Living Arrangements: Spouse/significant other ?Available Help at Discharge: Family ?Type of Home: House ?Home Access: Elevator ?  ?  ?  ?Home Layout: One level ?Home Equipment: Conservation officer, nature (2 wheels);Rollator (4 wheels);Cane - single point;Cane - quad;BSC/3in1;Shower seat;Wheelchair - manual;Wheelchair - power ?Additional Comments: Doesn't use ADs; has lift chair at home that he sleeps in  ?  ?Prior Function Prior Level of Function : Independent/Modified Independent;Driving ?  ?  ?  ?  ?  ?  ?Mobility Comments: doesn't use AD, sleeps in recliner ?ADLs Comments: indep ?  ? ? ?Hand Dominance  ? Dominant Hand: Right ? ?  ?Extremity/Trunk Assessment  ? Upper Extremity Assessment ?Upper Extremity Assessment: Defer to OT evaluation ?  ? ?Lower Extremity Assessment ?Lower Extremity Assessment: Generalized weakness ?  ? ?Cervical / Trunk Assessment ?Cervical / Trunk Assessment: Other exceptions (increased body habitus)  ?Communication  ? Communication: No difficulties  ?Cognition Arousal/Alertness: Awake/alert ?Behavior During Therapy: WFL for tasks assessed/performed (very talkative) ?Overall Cognitive Status: Within Functional Limits for tasks assessed ?  ?  ?  ?  ?  ?  ?  ?  ?  ?  ?  ?  ?  ?  ?  ?  ?  ?  ?  ? ?  ?General Comments General comments (skin integrity, edema, etc.): SpO2 dropped to 85% on RA during amb ? ?  ?Exercises    ? ?Assessment/Plan  ?  ?PT Assessment Patient needs continued PT services  ?PT Problem List Decreased strength;Decreased activity tolerance;Decreased balance;Decreased mobility ? ?   ?  ?PT Treatment Interventions DME instruction;Gait training;Stair training;Functional mobility training;Therapeutic activities;Therapeutic exercise;Balance training   ? ?PT Goals (Current goals can be found in the Care Plan section)  ?Acute Rehab PT Goals ?Patient Stated Goal: home today ?PT Goal Formulation: With patient ?Time  For Goal Achievement: 08/05/21 ?Potential to Achieve Goals: Good ? ?  ?Frequency Min 3X/week ?  ? ? ?Co-evaluation   ?  ?  ?  ?  ? ? ?  ?AM-PAC PT "6 Clicks" Mobility  ?Outcome Measure Help needed turning from your back to your side while in a flat bed without using bedrails?: A Little ?Help needed moving from lying on your back to sitting on the side of a flat bed without using bedrails?: A Little ?Help needed moving to and from a bed to a chair (including a wheelchair)?: A Little ?Help needed standing up from a chair using your arms (e.g., wheelchair or bedside chair)?: A Little ?Help needed to walk in hospital room?: A Little ?Help needed climbing 3-5 steps with a railing? : A Little ?6 Click Score: 18 ? ?  ?End of Session Equipment Utilized During Treatment: Oxygen (2Lo2 via [place]) ?Activity Tolerance: Patient limited by fatigue ?Patient left: in chair;with call bell/phone within reach;with family/visitor present ?Nurse Communication: Mobility status ?PT Visit Diagnosis: Unsteadiness on feet (R26.81);Difficulty in walking, not elsewhere classified (R26.2) ?  ? ?  Time: 0017-4944 ?PT Time Calculation (min) (ACUTE ONLY): 22 min ? ? ?Charges:   PT Evaluation ?$PT Eval Low Complexity: 1 Low ?  ?  ?   ? ? ?Kittie Plater, PT, DPT ?Acute Rehabilitation Services ?Secure chat preferred ?Office #: [phone] ? ? ?Unknown Schleyer M Jaccob Czaplicki ?07/23/2021, 1:12 PM ? ?

## 2021-07-23 NOTE — Progress Notes (Signed)
? ? ?1 Day Post-Op  ?Subjective: ?Had some nausea and vomiting overnight.  Better this morning, but starting to sip on some coffee and eat some grits.  Up in the chair already.  Says pain is much different today than yesterday.  Just sore today. ? ?ROS: See above, otherwise other systems negative ? ?Objective: ?Vital signs in last 24 hours: ?Temp:  [97.6 ?F (36.4 ?C)-99.1 ?F (37.3 ?C)] 98.3 ?F (36.8 ?C) (05/16 0525) ?Pulse Rate:  [80-90] 80 (05/16 0525) ?Resp:  [16-23] 18 (05/16 0525) ?BP: (123-156)/(42-79) 132/61 (05/16 0525) ?SpO2:  [92 %-96 %] 95 % (05/16 0525) ?Weight:  [115.7 kg] 115.7 kg (05/15 1250) ?  ? ?Intake/Output from previous day: ?05/15 0701 - 05/16 0700 ?In: 2647.4 [I.V.:2310.5; IV Piggyback:336.9] ?Out: 425 [Urine:400; Blood:25] ?Intake/Output this shift: ?No intake/output data recorded. ? ?PE: ?Abd: soft, but mildly obese, appropriately tender, incisions c/d/i ? ?Lab Results:  ?Recent Labs  ?  07/22/21 ?9211 07/23/21 ?9417  ?WBC 15.0* 14.5*  ?HGB 14.0 12.9*  ?HCT 40.8 38.3*  ?PLT 139* 125*  ? ?BMET ?Recent Labs  ?  07/21/21 ?1107 07/21/21 ?1126 07/22/21 ?0252  ?NA 142 141 137  ?K 3.7 3.6 3.8  ?CL 109 105 104  ?CO2 26  --  25  ?GLUCOSE 126* 127* 131*  ?BUN 14 16 11   ?CREATININE 1.18 1.20 1.17  ?CALCIUM 9.0  --  8.7*  ? ?PT/INR ?Recent Labs  ?  07/21/21 ?1107  ?LABPROT 16.1*  ?INR 1.3*  ? ?CMP  ?   ?Component Value Date/Time  ? NA 137 07/22/2021 0252  ? NA 144 06/14/2019 1122  ? K 3.8 07/22/2021 0252  ? CL 104 07/22/2021 0252  ? CO2 25 07/22/2021 0252  ? GLUCOSE 131 (H) 07/22/2021 0252  ? BUN 11 07/22/2021 0252  ? BUN 14 06/14/2019 1122  ? CREATININE 1.17 07/22/2021 0252  ? CREATININE 1.00 10/17/2015 0948  ? CALCIUM 8.7 (L) 07/22/2021 0252  ? PROT 6.0 (L) 07/22/2021 0252  ? PROT 6.1 06/14/2019 1122  ? ALBUMIN 3.5 07/22/2021 0252  ? ALBUMIN 3.9 06/14/2019 1122  ? AST 19 07/22/2021 0252  ? ALT 11 07/22/2021 0252  ? ALKPHOS 57 07/22/2021 0252  ? BILITOT 2.6 (H) 07/22/2021 0252  ? BILITOT 0.9 06/14/2019  1122  ? GFRNONAA >60 07/22/2021 0252  ? GFRAA 75 06/14/2019 1122  ? ?Lipase  ?   ?Component Value Date/Time  ? LIPASE 25 07/21/2021 1107  ? ? ? ? ? ?Studies/Results: ?CT Angio Chest/Abd/Pel for Dissection W and/or Wo Contrast ? ?Result Date: 07/21/2021 ?CLINICAL DATA:  Acute aortic syndrome suspected. EXAM: CT ANGIOGRAPHY CHEST, ABDOMEN AND PELVIS TECHNIQUE: Non-contrast CT of the chest was initially obtained. Multidetector CT imaging through the chest, abdomen and pelvis was performed using the standard protocol during bolus administration of intravenous contrast. Multiplanar reconstructed images and MIPs were obtained and reviewed to evaluate the vascular anatomy. RADIATION DOSE REDUCTION: This exam was performed according to the departmental dose-optimization program which includes automated exposure control, adjustment of the mA and/or kV according to patient size and/or use of iterative reconstruction technique. CONTRAST:  16mL OMNIPAQUE IOHEXOL 350 MG/ML SOLN COMPARISON:  CT chest from 05/21/2021 and PET-CT from 02/25/2021. FINDINGS: CTA CHEST FINDINGS Cardiovascular: Preferential opacification of the thoracic aorta. No evidence of thoracic aortic aneurysm or dissection. Aortic atherosclerosis and coronary artery calcifications. Mild cardiac enlargement. No pericardial effusion. The main pulmonary artery appears patent. No signs of a central obstructing pulmonary embolus. Mediastinum/Nodes: No enlarged supraclavicular, axillary, mediastinal or  hilar lymph nodes. The thyroid gland, trachea and esophagus demonstrate no significant findings. Lungs/Pleura: There is no pleural effusion, airspace consolidation, or pneumothorax. Bandlike area of architectural distortion and fibrosis within the right upper lobe is again noted compatible with changes secondary to external beam radiation. Central nodular component within this area is again noted measuring 1 point 3 x 1.4 cm is stable from the previous exam. Imaging  findings are compatible with changes secondary to external beam radiation with underlying treated tumor. Stable scarring within the left lower lobe. Musculoskeletal: Multiple remote healed left rib fractures are again seen in appears similar to previous exam. No acute or suspicious osseous findings identified. Review of the MIP images confirms the above findings. CTA ABDOMEN AND PELVIS FINDINGS VASCULAR Aorta: Normal caliber aorta without aneurysm, dissection, vasculitis or significant stenosis. Aortic atherosclerotic calcifications. Celiac: Patent without evidence of aneurysm, dissection, vasculitis or significant stenosis. SMA: Patent without evidence of aneurysm, dissection, vasculitis or significant stenosis. Renals: Both renal arteries are patent without evidence of aneurysm, dissection, vasculitis, fibromuscular dysplasia or significant stenosis. IMA: Patent without evidence of aneurysm, dissection, vasculitis or significant stenosis. Inflow: Patent without evidence of aneurysm, dissection, vasculitis or significant stenosis. Veins: No obvious venous abnormality within the limitations of this arterial phase study. Review of the MIP images confirms the above findings. NON-VASCULAR Hepatobiliary: No suspicious liver lesions. Several scattered subcentimeter low-density liver foci are technically too small to characterize small stones are identified within the dependent portion of the gallbladder. There is mild hazy soft tissue stranding around the gallbladder. No signs of bile duct dilatation. Pancreas: Unremarkable. No pancreatic ductal dilatation or surrounding inflammatory changes. Spleen: Normal in size without focal abnormality. Adrenals/Urinary Tract: Bilateral Bosniak class 1 and 2 kidney cysts are identified. The largest arises off the upper pole of the right kidney measuring 3.1 cm. No follow-up recommended. No signs of hydronephrosis bilaterally. Urinary bladder is unremarkable. Stomach/Bowel: Small  hiatal hernia is identified. The appendix is visualized and appears normal. No small bowel wall thickening, inflammation or distension. There is a ventral abdominal wall hernia which contains a nonobstructed loop of small bowel, image 256/6. There is mild wall thickening involving the ascending colon adjacent to the gallbladder, image 70/9. Extensive sigmoid diverticulosis without signs of acute diverticulitis. Lymphatic: No signs of abdominopelvic adenopathy. Reproductive: Prostate gland is not visualized compatible with prior prostatectomy. Other: No significant free fluid or fluid collections identified. No signs of pneumoperitoneum. Musculoskeletal: No acute or significant osseous findings. Review of the MIP images confirms the above findings. IMPRESSION: 1. No evidence for aortic dissection or aneurysm. 2. Tiny layering gallstones. Mild hazy soft tissue stranding around the gallbladder. Cannot exclude gallbladder wall inflammation due to cholecystitis. Consider further evaluation with right upper quadrant sonogram to assess for underlying gallbladder inflammation. 3. Short segment of mild wall thickening involving the ascending colon where it abuts the gallbladder. If there are signs of gallbladder inflammation on right upper quadrant sonogram thin this may represent secondary inflammation of the colon. Alternatively correlate for any clinical signs or symptoms of colitis. 4. Ventral abdominal wall hernia containing a nonobstructed loop of small bowel. 5. Extensive sigmoid diverticulosis without signs of acute diverticulitis. 6. Stable post radiation change within the right upper lobe with stable treated Lung nodule. 7. Prostatectomy. 8. Small hiatal hernia. 9. Aortic Atherosclerosis (ICD10-I70.0). Electronically Signed   By: Kerby Moors M.D.   On: 07/21/2021 12:11  ? ?US Abdomen Limited RUQ (LIVER/GB) ? ?Result Date: 07/21/2021 ?CLINICAL DATA:  Pain right upper  quadrant EXAM: ULTRASOUND ABDOMEN LIMITED RIGHT  UPPER QUADRANT COMPARISON:  10/02/2016 FINDINGS: Gallbladder: Sludge is seen in the dependent portion of gallbladder lumen. There is no definite demonstrable gallbladder stone with acoustic shadowing. Poss

## 2021-07-24 LAB — BASIC METABOLIC PANEL
Anion gap: 6 (ref 5–15)
BUN: 16 mg/dL (ref 8–23)
CO2: 28 mmol/L (ref 22–32)
Calcium: 8.4 mg/dL — ABNORMAL LOW (ref 8.9–10.3)
Chloride: 108 mmol/L (ref 98–111)
Creatinine, Ser: 1.32 mg/dL — ABNORMAL HIGH (ref 0.61–1.24)
GFR, Estimated: 54 mL/min — ABNORMAL LOW (ref >60)
Glucose, Bld: 131 mg/dL — ABNORMAL HIGH (ref 70–99)
Potassium: 3.8 mmol/L (ref 3.5–5.1)
Sodium: 142 mmol/L (ref 135–145)

## 2021-07-24 LAB — CBC
HCT: 37.2 % — ABNORMAL LOW (ref 39.0–52.0)
Hemoglobin: 12.7 g/dL — ABNORMAL LOW (ref 13.0–17.0)
MCH: 31.3 pg (ref 26.0–34.0)
MCHC: 34.1 g/dL (ref 30.0–36.0)
MCV: 91.6 fL (ref 80.0–100.0)
Platelets: 129 10*3/uL — ABNORMAL LOW (ref 150–400)
RBC: 4.06 MIL/uL — ABNORMAL LOW (ref 4.22–5.81)
RDW: 13.2 % (ref 11.5–15.5)
WBC: 9.2 10*3/uL (ref 4.0–10.5)
nRBC: 0 % (ref 0.0–0.2)

## 2021-07-24 MED ORDER — FUROSEMIDE 20 MG PO TABS
ORAL_TABLET | ORAL | 1 refills | Status: DC
Start: 2021-07-27 — End: 2022-01-23

## 2021-07-24 MED ORDER — AMOXICILLIN-POT CLAVULANATE 875-125 MG PO TABS: 1.0000 | ORAL_TABLET | Freq: Two times a day (BID) | ORAL | 0 refills | Status: AC

## 2021-07-24 MED ORDER — OXYCODONE HCL 5 MG PO TABS: 5.0000 mg | ORAL_TABLET | Freq: Four times a day (QID) | ORAL | 0 refills | Status: DC | PRN

## 2021-07-24 MED ORDER — ACETAMINOPHEN 500 MG PO TABS: 1000.0000 mg | ORAL_TABLET | Freq: Four times a day (QID) | ORAL | 0 refills | Status: AC | PRN

## 2021-07-24 NOTE — Progress Notes (Signed)
Mobility Specialist: Progress Note ? ? 07/24/21 1201  ?Mobility  ?Activity Ambulated independently in hallway  ?Level of Assistance Independent  ?Assistive Device None  ?Distance Ambulated (ft) 160 ft  ?Activity Response Tolerated well  ?$Mobility charge 1 Mobility  ? ?Post-Mobility: 74 HR, 89-91% SpO2 ? ?Received pt in chair having no complaints and agreeable to mobility. Asymptomatic throughout ambulation, returned back to chair w/ call bell in reach and all needs met. ? ?Harrell Gave Merion Grimaldo ?Mobility Specialist ?Mobility Specialist [place]: [phone] ?Mobility Specialist [place]: [phone] ? ?

## 2021-07-24 NOTE — Progress Notes (Signed)
? ? ?2 Days Post-Op  ?Subjective: ?Feeling much better and eating ok.  Pain better today as well. ? ?ROS: See above, otherwise other systems negative ? ?Objective: ?Vital signs in last 24 hours: ?Temp:  [97.5 ?F (36.4 ?C)-98.3 ?F (36.8 ?C)] 97.8 ?F (36.6 ?C) (05/17 1610) ?Pulse Rate:  [61-66] 61 (05/17 0835) ?Resp:  [17-18] 17 (05/17 0835) ?BP: (111-141)/(47-74) 141/67 (05/17 0835) ?SpO2:  [93 %-95 %] 95 % (05/17 0835) ?Last BM Date : 07/21/21 ? ?Intake/Output from previous day: ?05/16 0701 - 05/17 0700 ?In: 1229.8 [P.O.:1100; IV Piggyback:129.8] ?Out: 0  ?Intake/Output this shift: ?No intake/output data recorded. ? ?PE: ?Abd: soft, but mildly obese, appropriately tender, incisions c/d/i ? ?Lab Results:  ?Recent Labs  ?  07/22/21 ?9604 07/23/21 ?5409  ?WBC 15.0* 14.5*  ?HGB 14.0 12.9*  ?HCT 40.8 38.3*  ?PLT 139* 125*  ? ?BMET ?Recent Labs  ?  07/22/21 ?0252 07/23/21 ?0614  ?NA 137 138  ?K 3.8 4.0  ?CL 104 106  ?CO2 25 23  ?GLUCOSE 131* 183*  ?BUN 11 17  ?CREATININE 1.17 1.33*  ?CALCIUM 8.7* 8.3*  ? ?PT/INR ?Recent Labs  ?  07/21/21 ?1107  ?LABPROT 16.1*  ?INR 1.3*  ? ?CMP  ?   ?Component Value Date/Time  ? NA 138 07/23/2021 0614  ? NA 144 06/14/2019 1122  ? K 4.0 07/23/2021 0614  ? CL 106 07/23/2021 0614  ? CO2 23 07/23/2021 0614  ? GLUCOSE 183 (H) 07/23/2021 8119  ? BUN 17 07/23/2021 0614  ? BUN 14 06/14/2019 1122  ? CREATININE 1.33 (H) 07/23/2021 1478  ? CREATININE 1.00 10/17/2015 0948  ? CALCIUM 8.3 (L) 07/23/2021 2956  ? PROT 5.6 (L) 07/23/2021 2130  ? PROT 6.1 06/14/2019 1122  ? ALBUMIN 2.8 (L) 07/23/2021 8657  ? ALBUMIN 3.9 06/14/2019 1122  ? AST 31 07/23/2021 0614  ? ALT QUANTITY NOT SUFFICIENT, UNABLE TO PERFORM TEST 07/23/2021 0614  ? ALKPHOS 47 07/23/2021 0614  ? BILITOT QUANTITY NOT SUFFICIENT, UNABLE TO PERFORM TEST 07/23/2021 0614  ? BILITOT 0.9 06/14/2019 1122  ? GFRNONAA 53 (L) 07/23/2021 8469  ? GFRAA 75 06/14/2019 1122  ? ?Lipase  ?   ?Component Value Date/Time  ? LIPASE 25 07/21/2021 1107   ? ? ? ? ? ?Studies/Results: ?No results found. ? ?Anti-infectives: ?Anti-infectives (From admission, onward)  ? ? Start     Dose/Rate Route Frequency Ordered Stop  ? 07/24/21 1000  amoxicillin-clavulanate (AUGMENTIN) 875-125 MG per tablet 1 tablet       ? 1 tablet Oral Every 12 hours 07/23/21 0847    ? 07/24/21 0000  amoxicillin-clavulanate (AUGMENTIN) 875-125 MG tablet       ? 1 tablet Oral Every 12 hours 07/24/21 0839 07/28/21 2359  ? 07/22/21 1600  cefTRIAXone (ROCEPHIN) 2 g in sodium chloride 0.9 % 100 mL IVPB  Status:  Discontinued       ? 2 g ?200 mL/hr over 30 Minutes Intravenous Every 24 hours 07/21/21 1729 07/22/21 0929  ? 07/22/21 1400  piperacillin-tazobactam (ZOSYN) IVPB 3.375 g       ? 3.375 g ?12.5 mL/hr over 240 Minutes Intravenous Every 8 hours 07/22/21 0929 07/24/21 0110  ? 07/21/21 1615  cefTRIAXone (ROCEPHIN) 1 g in sodium chloride 0.9 % 100 mL IVPB       ? 1 g ?200 mL/hr over 30 Minutes Intravenous  Once 07/21/21 1614 07/21/21 1746  ? 07/21/21 1615  metroNIDAZOLE (FLAGYL) IVPB 500 mg  Status:  Discontinued       ?  500 mg ?100 mL/hr over 60 Minutes Intravenous Every 12 hours 07/21/21 1614 07/22/21 0929  ? ?  ? ? ? ?Assessment/Plan ?POD 2, s/p lap chole for Acute, gangrenous cholecystitis by Dr. Donne Hazel 5/15 ?-augmentin started today.  Will complete 5 days of post op abx therapy.  This has been sent to his pharmacy already ?-eliquis may be resumed today ?-mobilize ?-tolerated HH diet ?-surgically stable for DC home today ?-follow up and surgical meds arranged for patient and DC instructions discussed with patient ?-discussed with primary service ? ?FEN - HH/IVFs ?VTE - eliquis may resume today, SCDs ?ID - augmentin x 5 days post op ? ?A fib - may resume eliquis ?HTN ?CKD - cr normal at 1.17 ?OSA ?GERD ?CHF, per chart, EF 55-60% ? ? LOS: 3 days  ? ? ?Henreitta Cea , PA-C ?[facility] ?07/24/2021, 8:40 AM ?Please see Amion for pager number during day hours 7:00am-4:30pm or 7:00am  -11:30am on weekends ? ?

## 2021-07-24 NOTE — TOC Transition Note (Signed)
Transition of Care (TOC) - CM/SW Discharge Note ? ? ?Patient Details  ?Name: [name] [name] ?MRN: [id number] ?Date of Birth: Nov 29, 1940 ? ?Transition of Care (TOC) CM/SW Contact:  ?Tom-Johnson, Renea Ee, RN ?Phone Number: ?07/24/2021, 10:58 AM ? ? ?Clinical Narrative:    ? ?Patient is scheduled for discharge today. NO PPT/OT recommendations noted. Denies any needs. Family to transport at discharge. No further TOC needs noted. ? ?Final next level of care: Home/Self Care ?Barriers to Discharge: Barriers Resolved ? ? ?Patient Goals and CMS Choice ?Patient states their goals for this hospitalization and ongoing recovery are:: To return home ?CMS Medicare.gov Compare Post Acute Care list provided to:: Patient ?Choice offered to / list presented to : NA ? ?Discharge Placement ?  ?           ?  ?Patient to be transferred to facility by: Family ?  ?  ? ?Discharge Plan and Services ?  ?Discharge Planning Services: CM Consult ?Post Acute Care Choice: NA          ?DME Arranged: N/A ?DME Agency: NA ?  ?  ?  ?HH Arranged: NA ?[place] Agency: NA ?  ?  ?  ? ?Social Determinants of Health (SDOH) Interventions ?  ? ? ?Readmission Risk Interventions ?   ? View : No data to display.  ?  ?  ?  ? ? ? ? ? ?

## 2021-07-24 NOTE — Progress Notes (Signed)
[name] [name] to be discharged Home per MD order. Discussed prescriptions and follow up appointments with the patient. Prescriptions given to patient; medication list explained in detail. Patient verbalized understanding. ? ?Skin clean, dry and intact without evidence of skin break down, no evidence of skin tears noted. IV catheter discontinued intact. Site without signs and symptoms of complications. Dressing and pressure applied. Pt denies pain at the site currently. No complaints noted. ? ?Patient free of lines, drains, and wounds.  ? ?An After Visit Summary (AVS) was printed and given to the patient. ?Patient escorted via wheelchair, and discharged home via private auto. ? ?Amaryllis Dyke, RN  ?

## 2021-07-24 NOTE — Care Management Important Message (Signed)
Important Message ? ?Patient Details  ?Name: [name] [name] ?MRN: [id number] ?Date of Birth: 12-15-1940 ? ? ?Medicare Important Message Given:  Yes ? ?Patient left prior to IM delivery will mail IM to the patient home address.  ? ? ?[facility] ?07/24/2021, 1:02 PM ?

## 2021-07-24 NOTE — Discharge Summary (Signed)
? ?Physician Discharge Summary  ?[name] [name] EVO:[id number] DOB: Aug 09, 1940 DOA: 07/21/2021 ? ?PCP: Gaynelle Arabian, MD ? ?Admit date: 07/21/2021 ?Discharge date: 07/24/2021 ? ?Admitted From: Home ?Discharge disposition: Home ? ?Recommendations at discharge:  ?Complete the course of antibiotics as prescribed ?Okay to resume Eliquis ?Keep Lasix on hold for next 3 days. ?Follow-up with PCP in 1 to 2 weeks for renal function monitoring. ? ?History of Present Illness / Brief narrative:  ?Patient is a is a 81 y.o. male with medical history significant of atrial fibrillation, COPD, hx of prostate cancer s/p prostatectomy, hx of lung cancer and melanoma, CKD stage 3, GERD, moderate pulmonary HTN, HTN, OSA.   ?Patient presented to ED from urgent care on 5/14 with complaints of abdominal pain associated nausea and one episode of vomiting.   ?Upon arrival to ED, he was hemodynamically stable, afebrile.   ?He was diagnosed with acute cholecystitis.  ?Seen by general surgery  ?Admitted under hospitalist service ?Patient underwent lap chole on 5/15 by Dr. Donne Hazel for acute gangrenous cholecystitis. ?Postop status improving.  Cleared from surgical standpoint for discharge. ? ?Subjective:  ?Seen and examined this morning.  Pleasant elderly Caucasian male.  Sitting up in chair.  Not in distress.  No new symptoms. ? ?Hospital Course:  ?Acute acalculous cholecystitis ?-s/p laparoscopic cholecystectomy 07/22/2021.   ?-Postop status uneventful.   ?-Was kept on IV Zosyn.  Switch to oral Augmentin.  Patient to complete 5 days of postop antibiotic therapy.   ?-Okay to resume Eliquis per general surgery.   ?  ?PAF (paroxysmal atrial fibrillation)  ?-Currently in normal sinus rhythm.   ?-Continue flecainide.  Okay to resume Eliquis per general surgery. ? ?CKD 3a ?-Creatinine slightly elevated than baseline. ?-Currently Lasix is on hold.  Euvolemic.  Advised to keep Lasix on hold for next 3 days. ?-Follow-up with PCP in 1 to 2  weeks. ?Recent Labs  ?  08/17/20 ?2993 09/20/20 ?1503 10/08/20 ?1332 10/14/20 ?2121 10/14/20 ?2221 07/21/21 ?1107 07/21/21 ?1126 07/22/21 ?0252 07/23/21 ?7169 07/24/21 ?6789  ?BUN 26* 8 14 14 15 14 16 11 17 16   ?CREATININE 1.19 1.27* 1.28* 1.25* 1.10 1.18 1.20 1.17 1.33* 1.32*  ? ?Hypertension ?-Continue Micardis.  Keep Lasix on hold for next 3 days because of elevated creatinine.  Okay to resume after that  ?  ?OSA (obstructive sleep apnea) ?-Does not use cpap, wife states they were told he didn't need this.  ?  ?GERD (gastroesophageal reflux disease) ?-Continue PPI  ? ? ?Goals of care ?-  Code Status: Full Code  ? ?Nutritional status:  ?Body mass index is 39.95 kg/m?.  ?  ?  ?Diet:  ?Diet Order   ? ?       ?  Diet general       ?  ?  Diet Heart Room service appropriate? Yes; Fluid consistency: Thin  Diet effective now       ?  ? ?  ?  ? ?  ? ? ? ?Wounds:  ?- ?Wound / Incision (Open or Dehisced) 10/14/20 Laceration Head Lower;Posterior (Active)  ?Date First Assessed/Time First Assessed: 10/14/20 0030   Wound Type: Laceration  Location: Head  Location Orientation: Lower;Posterior  Present on Admission: Yes  ?  ?Assessments 10/15/2020  3:30 AM 10/19/2020  8:00 AM  ?Dressing Type None None  ?Site / Wound Assessment Bleeding Clean;Dry  ?Closure Sutures Staples  ?Drainage Amount Minimal None  ?Drainage Description Serous --  ?   ?No Linked orders to display  ?   ?  Incision (Closed) 07/22/21 Abdomen (Active)  ?Date First Assessed/Time First Assessed: 07/22/21 1439   Location: Abdomen  ?  ?Assessments 07/22/2021  3:00 PM 07/24/2021  7:54 AM  ?Dressing Type Adhesive strips Adhesive strips  ?Dressing Clean, Dry, Intact Clean, Dry, Intact  ?Site / Wound Assessment Clean;Dry Clean;Dry  ?Margins Attached edges (approximated) Attached edges (approximated)  ?Closure Adhesive strips Adhesive strips  ?Drainage Amount None None  ?   ?No Linked orders to display  ?   ?Incision - 4 Ports Abdomen 1: Mid 2: Mid;Upper 3: Right 4: Right  (Active)  ?Placement Date/Time: 07/22/21 1400   Location of Ports: Abdomen  Port: 1:  Location Orientation: Mid  Port: 2:  Location Orientation: Mid;Upper  Port: 3:  Location Orientation: Right  Port: 4:  Location Orientation: Right  ?  ?Assessments 07/22/2021  3:00 PM 07/24/2021  7:54 AM  ?Port 1 Site Assessment Clean;Dry Dry;Clean  ?Port 1 Margins Attached edges (approximated) --  ?Port 1 Drainage Amount None None  ?Port 1 Dressing Type Adhesive strips Adhesive strips  ?Port 1 Dressing Status Clean, Dry, Intact Clean, Dry, Intact  ?Port 2 Site Assessment Clean;Dry Clean;Dry  ?Port 2 Margins Attached edges (approximated) Attached edges (approximated)  ?Port 2 Drainage Amount None None  ?Port 2 Dressing Type Adhesive strips Adhesive strips  ?Port 2 Dressing Status Clean, Dry, Intact Clean, Dry, Intact  ?Port 3 Site Assessment Clean;Dry Clean;Dry  ?Port 3 Margins Attached edges (approximated) --  ?Port 3 Drainage Amount None --  ?Port 3 Dressing Type Adhesive strips Adhesive strips  ?Port 3 Dressing Status Clean, Dry, Intact Clean, Dry, Intact  ?Port 4 Site Assessment Clean;Dry Clean;Dry  ?Port 4 Margins Attached edges (approximated) Attached edges (approximated)  ?Port 4 Drainage Amount None None  ?Port 4 Dressing Type Adhesive strips Adhesive strips  ?Port 4 Dressing Status Clean, Dry, Intact Clean, Dry, Intact  ?   ?No Linked orders to display  ? ? ?Discharge Exam:  ? ?Vitals:  ? 07/24/21 0450 07/24/21 0450 07/24/21 0457 07/24/21 0835  ?BP: 132/69 132/69  (!) 141/67  ?Pulse: (!) 58 (!) 58  61  ?Resp: 19 19  17   ?Temp: (!) 97.4 ?F (36.3 ?C) (!) 97.4 ?F (36.3 ?C) (!) 97.5 ?F (36.4 ?C) 97.8 ?F (36.6 ?C)  ?TempSrc: Oral Oral Oral Oral  ?SpO2: 91% 90%  95%  ?Weight:      ?Height:      ? ? ?Body mass index is 39.95 kg/m?.  ?General exam: Pleasant, elderly Caucasian male.  Not in physical distress ?Skin: No rashes, lesions or ulcers. ?HEENT: Atraumatic, normocephalic, no obvious bleeding ?Lungs: Clear to auscultation  bilaterally ?CVS: Regular rate and rhythm, no murmur ?GI/Abd soft, mild appropriate tenderness postsurgically.  Bowel sound present ?CNS: Alert, awake, oriented x3 ?Psychiatry: Mood appropriate ?Extremities: No pedal edema, no calf tenderness ? ?Follow ups:  ? ? Follow-up Information   ? ? [facility], [facility] Follow up on 08/13/2021.   ?Specialty: General Surgery ?Why: 10am, Please arrive 30 minutes prior to appointment time for paperwork and check in time ?Contact information: ?[address] ?[address] ?[place] [address] ?[phone] ? ? ?  ?  ? ? Gaynelle Arabian, MD Follow up.   ?Specialty: Family Medicine ?Contact information: ?[address] ?[address] ?[place] [address] ?[phone] ? ? ?  ?  ? ? Belva Crome, MD .   ?Specialty: Cardiology ?Contact information: ?[address] ?[address] ?[place] [address] ?[phone] ? ? ?  ?  ? ?  ?  ? ?  ? ? ?  Discharge Instructions:  ? ?Discharge Instructions   ? ? Call MD for:  difficulty breathing, headache or visual disturbances   Complete by: As directed ?  ? Call MD for:  extreme fatigue   Complete by: As directed ?  ? Call MD for:  hives   Complete by: As directed ?  ? Call MD for:  persistant dizziness or light-headedness   Complete by: As directed ?  ? Call MD for:  persistant nausea and vomiting   Complete by: As directed ?  ? Call MD for:  severe uncontrolled pain   Complete by: As directed ?  ? Call MD for:  temperature >100.4   Complete by: As directed ?  ? Diet general   Complete by: As directed ?  ? Discharge instructions   Complete by: As directed ?  ? Recommendations at discharge:  ?? Complete the course of antibiotics as prescribed ?? Okay to resume Eliquis ?? Keep Lasix on hold for next 3 days. ?? Follow-up with PCP in 1 to 2 weeks for renal function monitoring. ? ?General discharge instructions: ?Follow with Primary MD Gaynelle Arabian, MD in 7 days  ?Please request your PCP  to go over your hospital tests, procedures, radiology  results at the follow up. Please get your medicines reviewed and adjusted.  Your PCP may decide to repeat certain labs or tests as needed. ?Do not drive, operate heavy machinery, perform activities at heights, swimming or

## 2021-07-30 DIAGNOSIS — N179 Acute kidney failure, unspecified: Secondary | ICD-10-CM | POA: Diagnosis not present

## 2021-08-13 DIAGNOSIS — R1011 Right upper quadrant pain: Secondary | ICD-10-CM | POA: Diagnosis not present

## 2021-08-18 DIAGNOSIS — S2249XA Multiple fractures of ribs, unspecified side, initial encounter for closed fracture: Secondary | ICD-10-CM | POA: Diagnosis not present

## 2021-09-17 DIAGNOSIS — S2249XA Multiple fractures of ribs, unspecified side, initial encounter for closed fracture: Secondary | ICD-10-CM | POA: Diagnosis not present

## 2021-09-26 DIAGNOSIS — S0502XA Injury of conjunctiva and corneal abrasion without foreign body, left eye, initial encounter: Secondary | ICD-10-CM | POA: Diagnosis not present

## 2021-10-07 ENCOUNTER — Other Ambulatory Visit: Payer: Self-pay | Admitting: Interventional Cardiology

## 2021-10-07 DIAGNOSIS — I480 Paroxysmal atrial fibrillation: Secondary | ICD-10-CM

## 2021-10-07 NOTE — Telephone Encounter (Signed)
Prescription refill request for Eliquis received. Indication: Afib  Last office visit: 12/21/20 Tamala Julian)  Scr: 1.32 (07/24/21)  Age: 81 Weight: 115.7kg  Appropriate dose and refill sent to requested pharmacy.

## 2021-10-18 DIAGNOSIS — S2249XA Multiple fractures of ribs, unspecified side, initial encounter for closed fracture: Secondary | ICD-10-CM | POA: Diagnosis not present

## 2021-11-01 ENCOUNTER — Telehealth: Payer: Self-pay

## 2021-11-01 NOTE — Telephone Encounter (Signed)
**Note De-Identified Brittane Grudzinski Obfuscation** The pts completed BMSPAF application for Eliquis with documents was faxed to [place] from Jake Shark, [place] with [facility] with a request for [place] to complete the providers page, have Dr Tamala Julian sign and date and to the fax all to [facility].  I have completed the providers page of his application and have e-mailed all to Dr Darliss Ridgel nurse so she can obtain his signature, date it, and to then fax all to [facility] at the fax number written on the cover letter included.

## 2021-11-04 NOTE — Telephone Encounter (Signed)
Patient assistance forms signed and faxed.

## 2021-11-08 ENCOUNTER — Ambulatory Visit: Payer: Self-pay

## 2021-11-08 NOTE — Patient Outreach (Signed)
  Care Coordination   11/08/2021 Name: [name] [name] MRN: [id number] DOB: [birth year]   Care Coordination Outreach Attempts:  An unsuccessful telephone outreach was attempted today to offer the patient information about available care coordination services as a benefit of their health plan.   Follow Up Plan:  Additional outreach attempts will be made to offer the patient care coordination information and services.   Encounter Outcome:  No Answer  Care Coordination Interventions Activated:  No   Care Coordination Interventions:  No, not indicated    [facility] [phone]

## 2021-11-16 ENCOUNTER — Other Ambulatory Visit: Payer: Self-pay | Admitting: Interventional Cardiology

## 2021-11-18 DIAGNOSIS — S2249XA Multiple fractures of ribs, unspecified side, initial encounter for closed fracture: Secondary | ICD-10-CM | POA: Diagnosis not present

## 2021-11-19 DIAGNOSIS — C44311 Basal cell carcinoma of skin of nose: Secondary | ICD-10-CM | POA: Diagnosis not present

## 2021-11-19 DIAGNOSIS — L570 Actinic keratosis: Secondary | ICD-10-CM | POA: Diagnosis not present

## 2021-11-21 DIAGNOSIS — R0609 Other forms of dyspnea: Secondary | ICD-10-CM | POA: Diagnosis not present

## 2021-11-21 DIAGNOSIS — R911 Solitary pulmonary nodule: Secondary | ICD-10-CM | POA: Diagnosis not present

## 2021-11-21 DIAGNOSIS — R918 Other nonspecific abnormal finding of lung field: Secondary | ICD-10-CM | POA: Diagnosis not present

## 2021-11-21 DIAGNOSIS — I700 Atherosclerosis of aorta: Secondary | ICD-10-CM | POA: Diagnosis not present

## 2021-11-28 DIAGNOSIS — R911 Solitary pulmonary nodule: Secondary | ICD-10-CM | POA: Diagnosis not present

## 2021-11-28 DIAGNOSIS — R053 Chronic cough: Secondary | ICD-10-CM | POA: Diagnosis not present

## 2021-11-28 DIAGNOSIS — Z923 Personal history of irradiation: Secondary | ICD-10-CM | POA: Diagnosis not present

## 2021-11-28 DIAGNOSIS — R0609 Other forms of dyspnea: Secondary | ICD-10-CM | POA: Diagnosis not present

## 2021-11-28 DIAGNOSIS — Z7951 Long term (current) use of inhaled steroids: Secondary | ICD-10-CM | POA: Diagnosis not present

## 2021-11-29 ENCOUNTER — Telehealth: Payer: Self-pay

## 2021-11-29 NOTE — Patient Outreach (Signed)
  Care Coordination   11/29/2021 Name: [name] [name] MRN: [id number] DOB: 1940/11/04   Care Coordination Outreach Attempts:  A second unsuccessful outreach was attempted today to offer the patient with information about available care coordination services as a benefit of their health plan.     Follow Up Plan:  Additional outreach attempts will be made to offer the patient care coordination information and services.   Encounter Outcome:  No Answer  Care Coordination Interventions Activated:  No   Care Coordination Interventions:  No, not indicated    [facility] [phone]

## 2021-12-18 DIAGNOSIS — S2249XA Multiple fractures of ribs, unspecified side, initial encounter for closed fracture: Secondary | ICD-10-CM | POA: Diagnosis not present

## 2022-01-07 ENCOUNTER — Ambulatory Visit: Payer: Medicare Other | Admitting: Nurse Practitioner

## 2022-01-10 DIAGNOSIS — Z23 Encounter for immunization: Secondary | ICD-10-CM | POA: Diagnosis not present

## 2022-01-10 DIAGNOSIS — I4891 Unspecified atrial fibrillation: Secondary | ICD-10-CM | POA: Diagnosis not present

## 2022-01-10 DIAGNOSIS — J449 Chronic obstructive pulmonary disease, unspecified: Secondary | ICD-10-CM | POA: Diagnosis not present

## 2022-01-10 DIAGNOSIS — I10 Essential (primary) hypertension: Secondary | ICD-10-CM | POA: Diagnosis not present

## 2022-01-10 DIAGNOSIS — I700 Atherosclerosis of aorta: Secondary | ICD-10-CM | POA: Diagnosis not present

## 2022-01-10 DIAGNOSIS — J45909 Unspecified asthma, uncomplicated: Secondary | ICD-10-CM | POA: Diagnosis not present

## 2022-01-10 DIAGNOSIS — Z85118 Personal history of other malignant neoplasm of bronchus and lung: Secondary | ICD-10-CM | POA: Diagnosis not present

## 2022-01-10 DIAGNOSIS — R609 Edema, unspecified: Secondary | ICD-10-CM | POA: Diagnosis not present

## 2022-01-10 DIAGNOSIS — Z85820 Personal history of malignant melanoma of skin: Secondary | ICD-10-CM | POA: Diagnosis not present

## 2022-01-10 DIAGNOSIS — Z0000 Encounter for general adult medical examination without abnormal findings: Secondary | ICD-10-CM | POA: Diagnosis not present

## 2022-01-18 DIAGNOSIS — S2249XA Multiple fractures of ribs, unspecified side, initial encounter for closed fracture: Secondary | ICD-10-CM | POA: Diagnosis not present

## 2022-01-20 NOTE — Progress Notes (Unsigned)
Cardiology Office Note:    Date:  01/23/2022   ID:  [name], [name] [birth year], MRN [id number]  PCP:  Gaynelle Arabian, MD   [facility] Providers Cardiologist:  [name] Grooms, MD Click to update primary MD,subspecialty MD or APP then REFRESH:1}    Referring MD: Gaynelle Arabian, MD   Chief Complaint: follow-up pulmonary hypertension   History of Present Illness:    [name] [name] is a pleasant  81 y.o. male with a hx of PAF on flecainide and chronic anticoagulation, CAD, OSA, HTN, CKD, COPD, melanoma, former tobacco abuse.  Nonobstructive coronary artery disease per cath in 2017.  Mildly to moderately elevated left ventricular filling pressures on cath.  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  He has maintained consistent follow-up.  [name].  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  2D echo 10/08/2020 revealed LVEF 55 to 60%, no RWMA, indeterminate diastolic parameters, RV systolic function mildly reduced, moderately elevated pulmonary artery systolic pressure.  Per Dr. Tamala [name] no change from previous echo 16 months prior, moderate pulmonary hypertension.  Last cardiology clinic visit was 12/21/2020 with Dr. Tamala [name]. Has  He reported orthostatic dizziness had not improved on lower dose of atenolol. Also seen by [facility].  Today, he is here with his wife for follow-up. Reports he is feeling well. Feels like his breathing has improved. Continues to have regular wheezing.  Has lost about 10 lbs. Stopped eating ice cream every night.  He is now able to walk up he will at his home without stopping. Asks about bruising on arms with Eliquis. He is on appropriate dose. For the past 2 weeks, his wife has not given him Lasix because she felt like his leg swelling had improved. Both of his legs are at least moderately swollen on exam today. He elevates legs when sitting in the evenings. Does  not exercise on a regular basis. Does things around the house/yard. He denies chest pain,  fatigue, palpitations, melena, hematuria, hemoptysis, diaphoresis, weakness, presyncope, syncope, orthopnea, and PND.  Past Medical History:  Diagnosis Date   A-fib [facility])    Angina    chest pain- cardiac cath. followed in 2010, record available ,  told then that he should f/u /w Dr. Marisue [name]     Arthritis    R knee, back     Asthma    uses  singulair   Cancer [facility])    prostate, melanoma- 2010, excision     CHF (congestive heart failure) ([place])    Chronic kidney disease    prostate cancer - surg. removal- 1996   Colon polyps    Diverticulitis    Dysrhythmia    palpitations, followed by Dr.Ehinger, seen in prep for surgery on 03/27/2011    GERD (gastroesophageal reflux disease)    Hepatitis    jaundice- many yrs. ago   Hiatal hernia    Hypertension    Melanoma ([place])    Face   OSA (obstructive sleep apnea) 04/11/2015   Mild to moderate OSA with AHI 13.3/hr overall and AHI 36.8/hr during REM sleep.  Oxygen saturations were as low as 86% during respiratory events.   Pneumonia    hosp. 20 yrs. ago   Prostate cancer ([place]) 1995   Sleep apnea    study done, 10 yrs. ago, told that he needed  CPAP but  never used     Past Surgical History:  Procedure Laterality Date   CARDIAC CATHETERIZATION     2010   CARDIAC CATHETERIZATION N/A 10/22/2015   Procedure: Left Heart Cath and Coronary Angiography;  Surgeon: Nelva Bush, MD;  Location: [facility];  Service: Cardiovascular;  Laterality: N/A;   CARDIAC CATHETERIZATION     CHOLECYSTECTOMY N/A 07/22/2021   Procedure: LAPAROSCOPIC CHOLECYSTECTOMY;  Surgeon: Rolm Bookbinder, MD;  Location: [place];  Service: General;  Laterality: N/A;   FRACTURE SURGERY     L wrist, hardware- 1982   JOINT REPLACEMENT     L knee, 2009   LEFT HEART CATHETERIZATION WITH CORONARY ANGIOGRAM N/A 09/08/2011   Procedure: LEFT HEART CATHETERIZATION WITH CORONARY  ANGIOGRAM;  Surgeon: [name] Grooms, MD;  Location: [facility] C[facility];  Service: Cardiovascular;  Laterality: N/A;   LUNG CANCER SURGERY     PROSTATECTOMY     for ca   TONSILLECTOMY     as child   TOTAL KNEE ARTHROPLASTY  04/22/2011   Procedure: TOTAL KNEE ARTHROPLASTY;  Surgeon: Garald Balding, MD;  Location: [facility];  Service: Orthopedics;  Laterality: Right;    Current Medications: Current Meds  Medication Sig   acetaminophen (TYLENOL) 500 MG tablet Take 2 tablets (1,000 mg total) by mouth every 6 (six) hours as needed.   albuterol (PROVENTIL) (2.5 MG/3ML) 0.083% nebulizer solution Take 2.5 mg by nebulization every 4 (four) hours as needed for shortness of breath.   albuterol (VENTOLIN HFA) 108 (90 Base) MCG/ACT inhaler Inhale 1-2 puffs into the lungs every 6 (six) hours as needed for wheezing or shortness of breath.   apixaban (ELIQUIS) 5 MG TABS tablet TAKE 1 TABLET BY MOUTH TWICE  DAILY   Ascorbic Acid (VITAMIN C) 1000 MG tablet Take 1,000 mg by mouth daily.   azithromycin (ZITHROMAX) 250 MG tablet Take 250 mg by mouth 3 (three) times a week.   benzonatate (TESSALON) 100 MG capsule Take 200 mg by mouth daily as needed for cough.   budesonide-formoterol (SYMBICORT) 160-4.5 MCG/ACT inhaler Inhale 2 puffs into the lungs 2 (two) times daily.   Calcium Carbonate-Vitamin D 600-200 MG-UNIT TABS Take 1 tablet by mouth 2 (two) times daily.   flecainide (TAMBOCOR) 50 MG tablet TAKE 1 TABLET BY MOUTH TWICE  DAILY   fluticasone (FLONASE) 50 MCG/ACT nasal spray Place 1 spray into both nostrils daily as needed for allergies or rhinitis.   montelukast (SINGULAIR) 10 MG tablet Take 10 mg by mouth daily.   Multiple Vitamin (MULTIVITAMIN WITH MINERALS) TABS tablet Take 1 tablet by mouth daily.   niacinamide 500 MG tablet Take 500 mg by mouth 2 (two) times daily with a meal.   Omega-3 Fatty Acids (FISH OIL) 1200 MG CAPS Take 1,200 mg by mouth 2 (two) times daily.   omeprazole (PRILOSEC) 40 MG capsule  Take 40 mg by mouth at bedtime.    polyvinyl alcohol (LIQUIFILM TEARS) 1.4 % ophthalmic solution Place 1 drop into both eyes as needed for dry eyes.   rosuvastatin (CRESTOR) 20 MG tablet Take 1 tablet (20 mg total) by mouth daily.   telmisartan (MICARDIS) 80 MG tablet Take 80 mg by mouth daily.   [DISCONTINUED] furosemide (LASIX) 20 MG tablet Take 20mg  daily with extra 20mg  (total of 40mg ) every other day     Allergies:   Carvedilol, Tiotropium, Zolpidem, Morphine, Spiriva respimat [tiotropium bromide monohydrate], Aminophylline, Atenolol, and Lisinopril   Social History   Socioeconomic History   Marital status: Married    Spouse name: Not on file  Number of children: Not on file   Years of education: Not on file   Highest education level: Not on file  Occupational History   Not on file  Tobacco Use   Smoking status: Former    Packs/day: 1.50    Years: 30.00    Total pack years: 45.00    Types: Cigarettes    Quit date: 04/11/1983    Years since quitting: 38.8   Smokeless tobacco: Never  Vaping Use   Vaping Use: Never used  Substance and Sexual Activity   Alcohol use: No   Drug use: No   Sexual activity: Not on file  Other Topics Concern   Not on file  Social History Narrative   ** Merged History Encounter **       Social Determinants of Health   Financial Resource Strain: Not on file  Food Insecurity: Not on file  Transportation Needs: Not on file  Physical Activity: Not on file  Stress: Not on file  Social Connections: Not on file     Family History: The patient's family history includes Alzheimer's disease in his father; Breast cancer in his mother; Crohn's disease in his brother; Heart disease in his brother; Hypertension in his brother. There is no history of Anesthesia problems, Hypotension, Malignant hyperthermia, or Pseudochol deficiency.  ROS:   Please see the history of present illness.   All other systems reviewed and are negative.  Labs/Other Studies  Reviewed:    The following studies were reviewed today:  Echo 10/08/2020   1. Left ventricular ejection fraction, by estimation, is 55 to 60%. The  left ventricle has normal function. The left ventricle has no regional  wall motion abnormalities. The left ventricular internal cavity size was  moderately dilated. Left ventricular  diastolic parameters are indeterminate.   2. Right ventricular systolic function is mildly reduced. The right  ventricular size is normal. There is moderately elevated pulmonary artery  systolic pressure.   3. The mitral valve is normal in structure. Trivial mitral valve  regurgitation.   4. The aortic valve is tricuspid. Aortic valve regurgitation is not  visualized. Mild aortic valve sclerosis is present, with no evidence of  aortic valve stenosis.   Comparison(s): The left ventricular function is unchanged.  LHC 10/22/2015  1.  Mild luminal irregularities involving the RCA and LAD, similar to prior catheterizations.  No obstructive coronary artery disease. 2.  Mildly to moderately elevated left ventricular filling pressure (23 mmHg). 3.  Normal left ventricular contraction.   Plan: 1.  Continue medical management and primary prevention.  Recent Labs: 07/21/2021: Magnesium 2.1 07/23/2021: ALT QUANTITY NOT SUFFICIENT, UNABLE TO PERFORM TEST 07/24/2021: BUN 16; Creatinine, Ser 1.32; Hemoglobin 12.7; Platelets 129; Potassium 3.8; Sodium 142  Recent Lipid Panel    Component Value Date/Time   CHOL 159 06/14/2019 1122   TRIG 89 06/14/2019 1122   HDL 40 06/14/2019 1122   CHOLHDL 4.0 06/14/2019 1122   CHOLHDL 3.5 02/28/2017 0218   VLDL 10 02/28/2017 0218   LDLCALC 102 (H) 06/14/2019 1122     Risk Assessment/Calculations:    CHA2DS2-VASc Score = 4  The patient's score is based upon: CHF History: 1 HTN History: 1 Diabetes History: 0 Stroke History: 0 Vascular Disease History: 0 Age Score: 2 Gender Score: 0  Physical Exam:    VS:  BP 138/78    Pulse 63   Ht 5\' 7"  (1.702 m)   Wt 257 lb (116.6 kg)   SpO2 94%   BMI  40.25 kg/m     Wt Readings from Last 3 Encounters:  01/23/22 257 lb (116.6 kg)  07/22/21 255 lb 1.2 oz (115.7 kg)  12/21/20 260 lb 9.6 oz (118.2 kg)     GEN:  Obese, elderly male in no acute distress HEENT: Normal NECK: No JVD; No carotid bruits CARDIAC: RRR, no murmurs, rubs, gallops RESPIRATORY:  Clear to auscultation without rales, wheezing or rhonchi  ABDOMEN: Soft, non-tender, non-distended MUSCULOSKELETAL:  Bilateral LE edema; No deformity. 2+ pedal pulses, equal bilaterally SKIN: Warm and dry NEUROLOGIC:  Alert and oriented x 3 PSYCHIATRIC:  Normal affect   EKG:  EKG is ordered today.  The ekg ordered today demonstrates normal sinus rhythm at 63 bpm, incomplete right bundle branch block, LAD, QTc 429 ms   Diagnoses:    1. PAF (paroxysmal atrial fibrillation) ([place])   2. Essential hypertension   3. Ischemic heart disease   4. Chronic anticoagulation   5. High risk medication use    Assessment and Plan:     PAF on chronic anticoagulation and AAD: Maintaining sinus rhythm today. Rate is well controlled.  Feeling well with no c/o fast heart rate, palpitations. On flecainide 50 mg twice daily. Reviewed adequate dosing of Eliquis with patient and to moisturize skin to possibly improve bruising/tearing. No additional bleeding concerns. Continue Eliquis, flecainide.   High risk medication use: On flecainide as noted above. QTc is stable. No presyncope, syncope.  Pulmonary hypertension: Moderate pulmonary hypertension with normal LV function on echo 10/2020. Has moderate bilateral LE edema - wife thought legs looked less swollen and stopped Lasix. Volume status is difficult to assess due to body habitus, however there are no obvious signs of volume overload. Reports he is feeling well, dyspnea has slightly improved. No orthopnea, PND.  Advised him to resume Lasix 20 mg daily.   Hypertension: BP is well  controlled today.  BP well controlled at home, consistently  < 140/90. No medication changes today.   Nonobstructive CAD: Mild CAD on cath 2017. No chest pain. Feels that dyspnea is stable, slightly improved with recent weight loss.  No indication for further ischemic evaluation at this time.  Hyperlipidemia LDL goal < 70: LDL 109 on 01/10/2022. He is not currently on statin therapy.  LDL goal < 70 2/2 mild CAD on cardiac cath 10/2015.  He is agreeable to start rosuvastatin 20 mg daily. We will recheck fasting lipid and ALT in 2 to 3 months.     Disposition: 50-month follow-up with Dr. Glenford Bayley, transfer from Dr. Tamala [name]  Medication Adjustments/Labs and Tests Ordered: Current medicines are reviewed at length with the patient today.  Concerns regarding medicines are outlined above.  Orders Placed This Encounter  Procedures   ALT   Lipid Profile   EKG 12-Lead   Meds ordered this encounter  Medications   DISCONTD: furosemide (LASIX) 20 MG tablet    Sig: Take one (1) tablet by mouth ( 20 mg) daily.    Dispense:  90 tablet    Refill:  3   rosuvastatin (CRESTOR) 20 MG tablet    Sig: Take 1 tablet (20 mg total) by mouth daily.    Dispense:  90 tablet    Refill:  3   furosemide (LASIX) 20 MG tablet    Sig: Take one (1) tablet by mouth ( 20 mg) daily.    Dispense:  90 tablet    Refill:  3    Patient Instructions  Medication Instructions:   CHANGE Lasix one (1)  tablet by mouth (20 mg) daily.   START Rosuvastatin one (1) tablet by mouth ( 20 mg) in the evening.   *If you need a refill on your cardiac medications before your next appointment, please call your pharmacy*   Lab Work:   Your physician recommends that you return for a FASTING lipid profile/alt Wednesday, January 10. You can come in on the day of your appointment anytime between 7:30-4:30 fasting from midnight the night before.   If you have labs (blood work) drawn today and your tests are completely normal, you will  receive your results only by: [place] (if you have MyChart) OR A paper copy in the mail If you have any lab test that is abnormal or we need to change your treatment, we will call you to review the results.   Testing/Procedures:  None ordered.   Follow-Up: At [facility], you and your health needs are our priority.  As part of our continuing mission to provide you with exceptional heart care, we have created designated Provider Care Teams.  These Care Teams include your primary Cardiologist (physician) and Advanced Practice Providers (APPs -  Physician Assistants and Nurse Practitioners) who all work together to provide you with the care you need, when you need it.  We recommend signing up for the patient portal called "MyChart".  Sign up information is provided on this After Visit Summary.  MyChart is used to connect with patients for Virtual Visits (Telemedicine).  Patients are able to view lab/test results, encounter notes, upcoming appointments, etc.  Non-urgent messages can be sent to your provider as well.   To learn more about what you can do with MyChart, go to [email or web address].    Your next appointment:   6 month(s)  The format for your next appointment:   In Person  Provider:   Dr. Gasper Sells     Important Information About Sugar         Signed, Karron Alvizo, Lanice Schwab, NP  01/23/2022 5:35 PM    [place]

## 2022-01-21 DIAGNOSIS — C3411 Malignant neoplasm of upper lobe, right bronchus or lung: Secondary | ICD-10-CM | POA: Diagnosis not present

## 2022-01-23 ENCOUNTER — Ambulatory Visit: Payer: Medicare Other | Attending: Nurse Practitioner | Admitting: Nurse Practitioner

## 2022-01-23 ENCOUNTER — Encounter: Payer: Self-pay | Admitting: Nurse Practitioner

## 2022-01-23 VITALS — BP 138/78 | HR 63 | Ht 67.0 in | Wt 257.0 lb

## 2022-01-23 DIAGNOSIS — I259 Chronic ischemic heart disease, unspecified: Secondary | ICD-10-CM

## 2022-01-23 DIAGNOSIS — Z79899 Other long term (current) drug therapy: Secondary | ICD-10-CM | POA: Diagnosis not present

## 2022-01-23 DIAGNOSIS — I10 Essential (primary) hypertension: Secondary | ICD-10-CM

## 2022-01-23 DIAGNOSIS — Z7901 Long term (current) use of anticoagulants: Secondary | ICD-10-CM

## 2022-01-23 DIAGNOSIS — I480 Paroxysmal atrial fibrillation: Secondary | ICD-10-CM | POA: Diagnosis not present

## 2022-01-23 MED ORDER — FUROSEMIDE 20 MG PO TABS: ORAL_TABLET | ORAL | 3 refills | Status: DC

## 2022-01-23 MED ORDER — ROSUVASTATIN CALCIUM 20 MG PO TABS: 20.0000 mg | ORAL_TABLET | Freq: Every day | ORAL | 3 refills | Status: DC

## 2022-01-23 NOTE — Patient Instructions (Signed)
Medication Instructions:   CHANGE Lasix one (1) tablet by mouth (20 mg) daily.   START Rosuvastatin one (1) tablet by mouth ( 20 mg) in the evening.   *If you need a refill on your cardiac medications before your next appointment, please call your pharmacy*   Lab Work:   Your physician recommends that you return for a FASTING lipid profile/alt Wednesday, January 10. You can come in on the day of your appointment anytime between 7:30-4:30 fasting from midnight the night before.   If you have labs (blood work) drawn today and your tests are completely normal, you will receive your results only by: [place] (if you have MyChart) OR A paper copy in the mail If you have any lab test that is abnormal or we need to change your treatment, we will call you to review the results.   Testing/Procedures:  None ordered.   Follow-Up: At [facility], you and your health needs are our priority.  As part of our continuing mission to provide you with exceptional heart care, we have created designated Provider Care Teams.  These Care Teams include your primary Cardiologist (physician) and Advanced Practice Providers (APPs -  Physician Assistants and Nurse Practitioners) who all work together to provide you with the care you need, when you need it.  We recommend signing up for the patient portal called "MyChart".  Sign up information is provided on this After Visit Summary.  MyChart is used to connect with patients for Virtual Visits (Telemedicine).  Patients are able to view lab/test results, encounter notes, upcoming appointments, etc.  Non-urgent messages can be sent to your provider as well.   To learn more about what you can do with MyChart, go to [email or web address].    Your next appointment:   6 month(s)  The format for your next appointment:   In Person  Provider:   Dr. Gasper Sells     Important Information About Sugar

## 2022-02-04 ENCOUNTER — Other Ambulatory Visit: Payer: Self-pay | Admitting: Interventional Cardiology

## 2022-02-05 DIAGNOSIS — H52203 Unspecified astigmatism, bilateral: Secondary | ICD-10-CM | POA: Diagnosis not present

## 2022-02-05 DIAGNOSIS — H04123 Dry eye syndrome of bilateral lacrimal glands: Secondary | ICD-10-CM | POA: Diagnosis not present

## 2022-02-05 DIAGNOSIS — H524 Presbyopia: Secondary | ICD-10-CM | POA: Diagnosis not present

## 2022-03-19 ENCOUNTER — Other Ambulatory Visit: Payer: Medicare Other

## 2022-03-19 DIAGNOSIS — Z85118 Personal history of other malignant neoplasm of bronchus and lung: Secondary | ICD-10-CM | POA: Diagnosis not present

## 2022-03-19 DIAGNOSIS — Z87891 Personal history of nicotine dependence: Secondary | ICD-10-CM | POA: Diagnosis not present

## 2022-03-19 DIAGNOSIS — Z85828 Personal history of other malignant neoplasm of skin: Secondary | ICD-10-CM | POA: Diagnosis not present

## 2022-03-19 DIAGNOSIS — Z85820 Personal history of malignant melanoma of skin: Secondary | ICD-10-CM | POA: Diagnosis not present

## 2022-03-19 DIAGNOSIS — Z08 Encounter for follow-up examination after completed treatment for malignant neoplasm: Secondary | ICD-10-CM | POA: Diagnosis not present

## 2022-03-19 DIAGNOSIS — Z902 Acquired absence of lung [part of]: Secondary | ICD-10-CM | POA: Diagnosis not present

## 2022-03-19 DIAGNOSIS — Z85821 Personal history of Merkel cell carcinoma: Secondary | ICD-10-CM | POA: Diagnosis not present

## 2022-03-20 DIAGNOSIS — R233 Spontaneous ecchymoses: Secondary | ICD-10-CM | POA: Diagnosis not present

## 2022-03-20 DIAGNOSIS — L570 Actinic keratosis: Secondary | ICD-10-CM | POA: Diagnosis not present

## 2022-03-20 DIAGNOSIS — L853 Xerosis cutis: Secondary | ICD-10-CM | POA: Diagnosis not present

## 2022-03-20 DIAGNOSIS — L578 Other skin changes due to chronic exposure to nonionizing radiation: Secondary | ICD-10-CM | POA: Diagnosis not present

## 2022-03-20 DIAGNOSIS — C44311 Basal cell carcinoma of skin of nose: Secondary | ICD-10-CM | POA: Diagnosis not present

## 2022-03-21 ENCOUNTER — Ambulatory Visit: Payer: Medicare Other | Attending: Nurse Practitioner

## 2022-03-21 DIAGNOSIS — I259 Chronic ischemic heart disease, unspecified: Secondary | ICD-10-CM | POA: Diagnosis not present

## 2022-03-21 DIAGNOSIS — I480 Paroxysmal atrial fibrillation: Secondary | ICD-10-CM | POA: Diagnosis not present

## 2022-03-21 DIAGNOSIS — I10 Essential (primary) hypertension: Secondary | ICD-10-CM

## 2022-03-22 LAB — LIPID PANEL
Chol/HDL Ratio: 2.1 ratio (ref 0.0–5.0)
Cholesterol, Total: 123 mg/dL (ref 100–199)
HDL: 58 mg/dL (ref >39)
LDL Chol Calc (NIH): 49 mg/dL (ref 0–99)
Triglycerides: 85 mg/dL (ref 0–149)
VLDL Cholesterol Cal: 16 mg/dL (ref 5–40)

## 2022-03-22 LAB — ALT: ALT: 8 IU/L (ref 0–44)

## 2022-03-25 NOTE — Progress Notes (Signed)
Pt has been made aware of normal result and verbalized understanding.  jw

## 2022-04-01 DIAGNOSIS — J449 Chronic obstructive pulmonary disease, unspecified: Secondary | ICD-10-CM | POA: Diagnosis not present

## 2022-04-01 DIAGNOSIS — K219 Gastro-esophageal reflux disease without esophagitis: Secondary | ICD-10-CM | POA: Diagnosis not present

## 2022-04-01 DIAGNOSIS — I10 Essential (primary) hypertension: Secondary | ICD-10-CM | POA: Diagnosis not present

## 2022-04-01 DIAGNOSIS — I4891 Unspecified atrial fibrillation: Secondary | ICD-10-CM | POA: Diagnosis not present

## 2022-04-01 DIAGNOSIS — J45909 Unspecified asthma, uncomplicated: Secondary | ICD-10-CM | POA: Diagnosis not present

## 2022-04-17 ENCOUNTER — Encounter (HOSPITAL_COMMUNITY): Payer: Self-pay | Admitting: *Deleted

## 2022-06-02 DIAGNOSIS — I517 Cardiomegaly: Secondary | ICD-10-CM | POA: Diagnosis not present

## 2022-06-02 DIAGNOSIS — I700 Atherosclerosis of aorta: Secondary | ICD-10-CM | POA: Diagnosis not present

## 2022-06-02 DIAGNOSIS — J984 Other disorders of lung: Secondary | ICD-10-CM | POA: Diagnosis not present

## 2022-06-02 DIAGNOSIS — I2510 Atherosclerotic heart disease of native coronary artery without angina pectoris: Secondary | ICD-10-CM | POA: Diagnosis not present

## 2022-06-02 DIAGNOSIS — R911 Solitary pulmonary nodule: Secondary | ICD-10-CM | POA: Diagnosis not present

## 2022-06-02 DIAGNOSIS — R918 Other nonspecific abnormal finding of lung field: Secondary | ICD-10-CM | POA: Diagnosis not present

## 2022-06-02 DIAGNOSIS — I7789 Other specified disorders of arteries and arterioles: Secondary | ICD-10-CM | POA: Diagnosis not present

## 2022-06-05 DIAGNOSIS — J449 Chronic obstructive pulmonary disease, unspecified: Secondary | ICD-10-CM | POA: Diagnosis not present

## 2022-06-05 DIAGNOSIS — C4A9 Merkel cell carcinoma, unspecified: Secondary | ICD-10-CM | POA: Diagnosis not present

## 2022-06-05 DIAGNOSIS — C3411 Malignant neoplasm of upper lobe, right bronchus or lung: Secondary | ICD-10-CM | POA: Diagnosis not present

## 2022-06-05 DIAGNOSIS — Z87891 Personal history of nicotine dependence: Secondary | ICD-10-CM | POA: Diagnosis not present

## 2022-06-19 DIAGNOSIS — L570 Actinic keratosis: Secondary | ICD-10-CM | POA: Diagnosis not present

## 2022-07-01 ENCOUNTER — Other Ambulatory Visit: Payer: Self-pay | Admitting: Pharmacist

## 2022-07-01 DIAGNOSIS — I480 Paroxysmal atrial fibrillation: Secondary | ICD-10-CM

## 2022-07-01 MED ORDER — APIXABAN 5 MG PO TABS: 5.0000 mg | ORAL_TABLET | Freq: Two times a day (BID) | ORAL | 1 refills | Status: DC

## 2022-07-01 NOTE — Telephone Encounter (Signed)
Prescription refill request for Eliquis received. Indication: a fib Last office visit: 01/23/22 Scr: 1.29 03/19/22 care everywhere Age: 82 Weight: 116kg

## 2022-07-18 DIAGNOSIS — R609 Edema, unspecified: Secondary | ICD-10-CM | POA: Diagnosis not present

## 2022-07-18 DIAGNOSIS — I4891 Unspecified atrial fibrillation: Secondary | ICD-10-CM | POA: Diagnosis not present

## 2022-07-18 DIAGNOSIS — R059 Cough, unspecified: Secondary | ICD-10-CM | POA: Diagnosis not present

## 2022-07-18 DIAGNOSIS — J449 Chronic obstructive pulmonary disease, unspecified: Secondary | ICD-10-CM | POA: Diagnosis not present

## 2022-07-18 DIAGNOSIS — Z85118 Personal history of other malignant neoplasm of bronchus and lung: Secondary | ICD-10-CM | POA: Diagnosis not present

## 2022-07-18 DIAGNOSIS — D6869 Other thrombophilia: Secondary | ICD-10-CM | POA: Diagnosis not present

## 2022-07-18 DIAGNOSIS — J45909 Unspecified asthma, uncomplicated: Secondary | ICD-10-CM | POA: Diagnosis not present

## 2022-07-18 DIAGNOSIS — K219 Gastro-esophageal reflux disease without esophagitis: Secondary | ICD-10-CM | POA: Diagnosis not present

## 2022-07-18 DIAGNOSIS — L570 Actinic keratosis: Secondary | ICD-10-CM | POA: Diagnosis not present

## 2022-07-18 DIAGNOSIS — I700 Atherosclerosis of aorta: Secondary | ICD-10-CM | POA: Diagnosis not present

## 2022-07-24 DIAGNOSIS — C3411 Malignant neoplasm of upper lobe, right bronchus or lung: Secondary | ICD-10-CM | POA: Diagnosis not present

## 2022-07-25 ENCOUNTER — Ambulatory Visit: Payer: Medicare Other | Attending: Internal Medicine | Admitting: Internal Medicine

## 2022-07-25 ENCOUNTER — Encounter: Payer: Self-pay | Admitting: Internal Medicine

## 2022-07-25 VITALS — BP 118/64 | HR 64 | Ht 67.0 in | Wt 260.0 lb

## 2022-07-25 DIAGNOSIS — I480 Paroxysmal atrial fibrillation: Secondary | ICD-10-CM

## 2022-07-25 DIAGNOSIS — R079 Chest pain, unspecified: Secondary | ICD-10-CM | POA: Diagnosis not present

## 2022-07-25 DIAGNOSIS — R0602 Shortness of breath: Secondary | ICD-10-CM | POA: Diagnosis not present

## 2022-07-25 DIAGNOSIS — G4733 Obstructive sleep apnea (adult) (pediatric): Secondary | ICD-10-CM | POA: Diagnosis not present

## 2022-07-25 DIAGNOSIS — N1831 Chronic kidney disease, stage 3a: Secondary | ICD-10-CM | POA: Diagnosis not present

## 2022-07-25 MED ORDER — METOPROLOL TARTRATE 25 MG PO TABS: 25.0000 mg | ORAL_TABLET | Freq: Once | ORAL | 0 refills | Status: DC

## 2022-07-25 NOTE — Progress Notes (Signed)
Cardiology Office Note:    Date:  07/25/2022   ID:  Duncan, Bloyer 1940-06-26, MRN [id number]  PCP:  Blair Heys, MD  Cardiologist:  Christell Constant, MD   Referring MD: Blair Heys, MD   CC: Transition to new cardiology  History of Present Illness:    [name] [name] is a 82 y.o. male with a hx of PAF - on Flecainide, chronic anticoagulation with Eliquis and on chronic Tambocor therapy, CAD per cath in 2017 with non obstructive disease noted, OSA, HTN, and CKD. 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  Recent fall with injury on Eliquis.  2023: Last seen by Dr. Katrinka Blazing  Patient notes that he is doing ok until the last few days. .   Normally he feels well but he does chores and feels ok. The last few day has felt with more exertional fatigue. Yesterday he had new chest pains. Outside of this has felt well.     Past Medical History:  Diagnosis Date   A-fib [facility])    Angina    chest pain- cardiac cath. followed in 2010, record available ,  told then that he should f/u /w Dr. Manus Gunning     Arthritis    R knee, back     Asthma    uses  singulair   Cancer [facility])    prostate, melanoma- 2010, excision     CHF (congestive heart failure) (HCC)    Chronic kidney disease    prostate cancer - surg. removal- 1996   Colon polyps    Diverticulitis    Dysrhythmia    palpitations, followed by Dr.Ehinger, seen in prep for surgery on 03/27/2011    GERD (gastroesophageal reflux disease)    Hepatitis    jaundice- many yrs. ago   Hiatal hernia    Hypertension    Melanoma (HCC)    Face   OSA (obstructive sleep apnea) 04/11/2015   Mild to moderate OSA with AHI 13.3/hr overall and AHI 36.8/hr during REM sleep.  Oxygen saturations were as low as 86% during respiratory events.   Pneumonia    hosp. 20 yrs. ago   Prostate cancer (HCC) 1995   Sleep apnea    study done, 10 yrs. ago, told that he needed  CPAP but  never used     Past Surgical  History:  Procedure Laterality Date   CARDIAC CATHETERIZATION     2010   CARDIAC CATHETERIZATION N/A 10/22/2015   Procedure: Left Heart Cath and Coronary Angiography;  Surgeon: Yvonne Kendall, MD;  Location: [facility] INVASIVE CV LAB;  Service: Cardiovascular;  Laterality: N/A;   CARDIAC CATHETERIZATION     CHOLECYSTECTOMY N/A 07/22/2021   Procedure: LAPAROSCOPIC CHOLECYSTECTOMY;  Surgeon: Emelia Loron, MD;  Location: [facility] OR;  Service: General;  Laterality: N/A;   FRACTURE SURGERY     L wrist, hardware- 1982   JOINT REPLACEMENT     L knee, 2009   LEFT HEART CATHETERIZATION WITH CORONARY ANGIOGRAM N/A 09/08/2011   Procedure: LEFT HEART CATHETERIZATION WITH CORONARY ANGIOGRAM;  Surgeon: Lesleigh Noe, MD;  Location: [facility] CATH LAB;  Service: Cardiovascular;  Laterality: N/A;   LUNG CANCER SURGERY     PROSTATECTOMY     for ca   TONSILLECTOMY     as child   TOTAL KNEE ARTHROPLASTY  04/22/2011   Procedure: TOTAL KNEE ARTHROPLASTY;  Surgeon: Valeria Batman, MD;  Location: [facility] OR;  Service: Orthopedics;  Laterality: Right;  Current Medications: Current Meds  Medication Sig   acetaminophen (TYLENOL) 500 MG tablet Take 2 tablets (1,000 mg total) by mouth every 6 (six) hours as needed.   albuterol (PROVENTIL) (2.5 MG/3ML) 0.083% nebulizer solution Take 2.5 mg by nebulization every 4 (four) hours as needed for shortness of breath.   albuterol (VENTOLIN HFA) 108 (90 Base) MCG/ACT inhaler Inhale 1-2 puffs into the lungs every 6 (six) hours as needed for wheezing or shortness of breath.   ammonium lactate (LAC-HYDRIN) 12 % lotion daily at 6 (six) AM.   apixaban (ELIQUIS) 5 MG TABS tablet Take 1 tablet (5 mg total) by mouth 2 (two) times daily.   Ascorbic Acid (VITAMIN C) 1000 MG tablet Take 1,000 mg by mouth daily.   azithromycin (ZITHROMAX) 250 MG tablet Take 250 mg by mouth 3 (three) times a week.   benzonatate (TESSALON) 100 MG capsule Take 200 mg by mouth daily as needed for cough.    Budeson-Glycopyrrol-Formoterol (BREZTRI AEROSPHERE) 160-9-4.8 MCG/ACT AERO Inhale 2 puffs into the lungs in the morning and at bedtime.   Calcium Carbonate-Vitamin D 600-200 MG-UNIT TABS Take 1 tablet by mouth 2 (two) times daily.   flecainide (TAMBOCOR) 50 MG tablet TAKE 1 TABLET BY MOUTH TWICE  DAILY   fluticasone (FLONASE) 50 MCG/ACT nasal spray Place 1 spray into both nostrils daily as needed for allergies or rhinitis.   furosemide (LASIX) 20 MG tablet Take one (1) tablet by mouth ( 20 mg) daily.   metoprolol tartrate (LOPRESSOR) 25 MG tablet Take 1 tablet (25 mg total) by mouth once for 1 dose. Take 90-120 minutes prior to scan.   montelukast (SINGULAIR) 10 MG tablet Take 10 mg by mouth daily.   Multiple Vitamin (MULTIVITAMIN WITH MINERALS) TABS tablet Take 1 tablet by mouth daily.   niacinamide 500 MG tablet Take 500 mg by mouth 2 (two) times daily with a meal.   Omega-3 Fatty Acids (FISH OIL) 1200 MG CAPS Take 1,200 mg by mouth 2 (two) times daily.   omeprazole (PRILOSEC) 40 MG capsule Take 40 mg by mouth at bedtime.    polyvinyl alcohol (LIQUIFILM TEARS) 1.4 % ophthalmic solution Place 1 drop into both eyes as needed for dry eyes.   rosuvastatin (CRESTOR) 20 MG tablet Take 1 tablet (20 mg total) by mouth daily.   telmisartan (MICARDIS) 80 MG tablet Take 80 mg by mouth daily.   [DISCONTINUED] budesonide-formoterol (SYMBICORT) 160-4.5 MCG/ACT inhaler Inhale 2 puffs into the lungs 2 (two) times daily.     Allergies:   Carvedilol, Tiotropium, Zolpidem, Morphine, Spiriva respimat [tiotropium bromide monohydrate], Aminophylline, Atenolol, and Lisinopril   Social History   Socioeconomic History   Marital status: Married    Spouse name: Not on file   Number of children: Not on file   Years of education: Not on file   Highest education level: Not on file  Occupational History   Not on file  Tobacco Use   Smoking status: Former    Packs/day: 1.50    Years: 30.00    Additional pack  years: 0.00    Total pack years: 45.00    Types: Cigarettes    Quit date: 04/11/1983    Years since quitting: 39.3   Smokeless tobacco: Never  Vaping Use   Vaping Use: Never used  Substance and Sexual Activity   Alcohol use: No   Drug use: No   Sexual activity: Not on file  Other Topics Concern   Not on file  Social History Narrative   **  Merged History Encounter **       Social Determinants of Health   Financial Resource Strain: Not on file  Food Insecurity: Not on file  Transportation Needs: Not on file  Physical Activity: Not on file  Stress: Not on file  Social Connections: Not on file     Family History: The patient's family history includes Alzheimer's disease in his father; Breast cancer in his mother; Crohn's disease in his brother; Heart disease in his brother; Hypertension in his brother. There is no history of Anesthesia problems, Hypotension, Malignant hyperthermia, or Pseudochol deficiency.  ROS:   Please see the history of present illness.     EKGs/Labs/Other Studies Reviewed:    The following studies were reviewed today:  Cardiac Studies & Procedures   CARDIAC CATHETERIZATION  CARDIAC CATHETERIZATION 10/22/2015  Narrative 1.  Mild luminal irregularities involving the RCA and LAD, similar to prior catheterizations.  No obstructive coronary artery disease. 2.  Mildly to moderately elevated left ventricular filling pressure (23 mmHg). 3.  Normal left ventricular contraction.  Plan: 1.  Continue medical management and primary prevention.  Findings Coronary Findings Diagnostic  Dominance: Right  Left Anterior Descending The vessel exhibits minimal luminal irregularities. Possible bridging noted in the mid/distal LAD, unchanged from prior catheterization.  First Diagonal Branch Vessel is moderate in size.  Second Diagonal Branch Vessel is moderate in size.  Ramus Intermedius Vessel is moderate in size.  Left Circumflex Vessel is moderate in  size.  Lateral First Obtuse Marginal Branch Vessel is small in size.  Second Obtuse Marginal Branch Vessel is moderate in size.  Right Coronary Artery Vessel is large. The vessel exhibits minimal luminal irregularities.  Right Posterior Descending Artery The vessel exhibits minimal luminal irregularities.  Intervention  No interventions have been documented.   CARDIAC CATHETERIZATION  CARDIAC CATHETERIZATION 02/07/2015   STRESS TESTS  NM MYOCAR MULTI W/SPECT W 03/18/2015  Narrative CLINICAL DATA:  Hypertension. Asthma. History of cardiac catheterization x2.  EXAM: MYOCARDIAL IMAGING WITH SPECT (REST AND PHARMACOLOGIC-STRESS)  GATED LEFT VENTRICULAR WALL MOTION STUDY  LEFT VENTRICULAR EJECTION FRACTION  TECHNIQUE: Standard myocardial SPECT imaging was performed after resting intravenous injection of 10 mCi Tc-38m sestamibi. Subsequently, intravenous infusion of Lexiscan was performed under the supervision of the Cardiology staff. At peak effect of the drug, 30 mCi Tc-54m sestamibi was injected intravenously and standard myocardial SPECT imaging was performed. Quantitative gated imaging was also performed to evaluate left ventricular wall motion, and estimate left ventricular ejection fraction.  COMPARISON:  None.  FINDINGS: Perfusion: Fixed defect involving the apex and distal segments of the lateral wall identified. No reversible ischemia identified.  Wall Motion: There is mild lateral wall hypokinesis.  Left Ventricular Ejection Fraction: 55 %  End diastolic volume 104 ml  End systolic volume 46 ml  IMPRESSION: 1. No reversible ischemia.  Apical and lateral wall infarct .  2. Lateral wall hypokinesis.  3. Left ventricular ejection fraction 55%  4. Low risk-risk stress test findings*.  *2012 Appropriate Use Criteria for Coronary Revascularization Focused Update: J Am Coll Cardiol.  2012;59(9):857-881. http://content.dementiazones.com.aspx?articleid=1201161   Electronically Signed By: Signa Kell M.D. On: 03/18/2015 16:40   ECHOCARDIOGRAM  ECHOCARDIOGRAM COMPLETE 10/08/2020  Narrative ECHOCARDIOGRAM REPORT    Patient Name:   GIANCARLOS NEWGARD Date of Exam: 10/08/2020 Medical Rec #:  [id number]         Height:       67.0 in Accession #:    [id number]  Weight:       249.0 lb Date of Birth:  1940-09-20         BSA:          2.220 m Patient Age:    43 years          BP:           162/77 mmHg Patient Gender: M                 HR:           77 bpm. Exam Location:  Outpatient  Procedure: 2D Echo, Cardiac Doppler and Color Doppler  Indications:    Atrial Fibrillation I48.91  History:        Patient has prior history of Echocardiogram examinations, most recent 06/29/2019. COPD, Signs/Symptoms:Chest Pain; Risk Factors:Former Smoker, Sleep Apnea and Hypertension. CKD.  Sonographer:    Renella Cunas RDCS Referring Phys: [phone] DONNA C CARROLL  IMPRESSIONS   1. Left ventricular ejection fraction, by estimation, is 55 to 60%. The left ventricle has normal function. The left ventricle has no regional wall motion abnormalities. The left ventricular internal cavity size was moderately dilated. Left ventricular diastolic parameters are indeterminate. 2. Right ventricular systolic function is mildly reduced. The right ventricular size is normal. There is moderately elevated pulmonary artery systolic pressure. 3. The mitral valve is normal in structure. Trivial mitral valve regurgitation. 4. The aortic valve is tricuspid. Aortic valve regurgitation is not visualized. Mild aortic valve sclerosis is present, with no evidence of aortic valve stenosis.  Comparison(s): The left ventricular function is unchanged.  FINDINGS Left Ventricle: Left ventricular ejection fraction, by estimation, is 55 to 60%. The left ventricle has normal function. The left ventricle has no  regional wall motion abnormalities. The left ventricular internal cavity size was moderately dilated. There is no left ventricular hypertrophy. Left ventricular diastolic parameters are indeterminate.  Right Ventricle: The right ventricular size is normal. Right vetricular wall thickness was not assessed. Right ventricular systolic function is mildly reduced. There is moderately elevated pulmonary artery systolic pressure. The tricuspid regurgitant velocity is 3.33 m/s, and with an assumed right atrial pressure of 3 mmHg, the estimated right ventricular systolic pressure is 47.4 mmHg.  Left Atrium: Left atrial size was normal in size.  Right Atrium: Right atrial size was normal in size.  Pericardium: There is no evidence of pericardial effusion.  Mitral Valve: The mitral valve is normal in structure. Trivial mitral valve regurgitation.  Tricuspid Valve: The tricuspid valve is normal in structure. Tricuspid valve regurgitation is mild.  Aortic Valve: The aortic valve is tricuspid. Aortic valve regurgitation is not visualized. Mild aortic valve sclerosis is present, with no evidence of aortic valve stenosis.  Pulmonic Valve: The pulmonic valve was normal in structure. Pulmonic valve regurgitation is mild to moderate.  Aorta: The aortic root and ascending aorta are structurally normal, with no evidence of dilitation.  IAS/Shunts: No atrial level shunt detected by color flow Doppler.   LEFT VENTRICLE PLAX 2D LVIDd:         5.70 cm      Diastology LVIDs:         3.90 cm      LV e' medial:    5.87 cm/s LV PW:         1.00 cm      LV E/e' medial:  10.4 LV IVS:        1.00 cm      LV e' lateral:   7.51  cm/s LVOT diam:     2.10 cm      LV E/e' lateral: 8.1 LV SV:         55 LV SV Index:   25 LVOT Area:     3.46 cm  LV Volumes (MOD) LV vol d, MOD A2C: 102.0 ml LV vol d, MOD A4C: 115.0 ml LV vol s, MOD A2C: 47.9 ml LV vol s, MOD A4C: 48.5 ml LV SV MOD A2C:     54.1 ml LV SV MOD A4C:      115.0 ml LV SV MOD BP:      60.1 ml  RIGHT VENTRICLE TAPSE (M-mode): 2.6 cm  LEFT ATRIUM             Index       RIGHT ATRIUM           Index LA diam:        4.60 cm 2.07 cm/m  RA Area:     19.40 cm LA Vol (A2C):   55.6 ml 25.05 ml/m RA Volume:   53.00 ml  23.88 ml/m LA Vol (A4C):   27.5 ml 12.39 ml/m LA Biplane Vol: 40.9 ml 18.43 ml/m AORTIC VALVE LVOT Vmax:   70.60 cm/s LVOT Vmean:  49.950 cm/s LVOT VTI:    0.158 m  AORTA Ao Root diam: 3.60 cm Ao Asc diam:  3.90 cm  MITRAL VALVE                TRICUSPID VALVE MV Area (PHT): 3.24 cm     TR Peak grad:   44.4 mmHg MV Decel Time: 234 msec     TR Vmax:        333.00 cm/s MV E velocity: 61.10 cm/s MV A velocity: 106.00 cm/s  SHUNTS MV E/A ratio:  0.58         Systemic VTI:  0.16 m Systemic Diam: 2.10 cm  Dietrich Pates MD Electronically signed by Dietrich Pates MD Signature Date/Time: 10/08/2020/3:10:36 PM    Final    MONITORS  CARDIAC EVENT MONITOR 03/21/2015  Narrative  Atrial fibrillation with moderate rate control  Atrial fibrillation documented            EKG:   07/25/2022: SR with 1st HB and rare PAC rate 64  Recent Labs: 03/21/2022: ALT 8  Recent Lipid Panel    Component Value Date/Time   CHOL 123 03/21/2022 1154   TRIG 85 03/21/2022 1154   HDL 58 03/21/2022 1154   CHOLHDL 2.1 03/21/2022 1154   CHOLHDL 3.5 02/28/2017 0218   VLDL 10 02/28/2017 0218   LDLCALC 49 03/21/2022 1154    Physical Exam:    VS:  BP 118/64   Pulse 64   Ht 5\' 7"  (1.702 m)   Wt 260 lb (117.9 kg)   SpO2 94%   BMI 40.72 kg/m     Wt Readings from Last 3 Encounters:  07/25/22 260 lb (117.9 kg)  01/23/22 257 lb (116.6 kg)  07/22/21 255 lb 1.2 oz (115.7 kg)    GEN: Morbid obesity. No acute distress HEENT: Normal NECK: No JVD. CARDIAC: No murmur. RRR +1 left leg edema VASCULAR:  Normal Pulses. No bruits. RESPIRATORY:  Clear to auscultation without rales, wheezing or rhonchi  ABDOMEN: Soft, non-tender, non-distended, No  pulsatile mass, MUSCULOSKELETAL: No deformity  SKIN: Warm and dry NEUROLOGIC:  Alert and oriented x 3 PSYCHIATRIC:  Normal affect   ASSESSMENT:    1. Chest pain of uncertain etiology   2.  SOB (shortness of breath)   3. OSA (obstructive sleep apnea)   4. PAF (paroxysmal atrial fibrillation) (HCC)   5. Stage 3a chronic kidney disease (HCC)     PLAN:    PAF on Flecainide 1st HB - with obstructive lung disease and prior lung cancer - QT interval 435 today  - needs ischemic work up; if positive stop flecainide  LE swelling and SOB HTN - will get echo and BNP and BMP; may increase lasix dose - no medication changes presently   Non obstructive CAD and chest wall radiation for lung cancer (seen at [facility]) - new CP HLD Aortic atheosclerosis  - LDL goal  < 55; at goal on current therapy  - will get CCTA;  Morbid Obesity OSA  - recommend  CPAP  If obstructive CAD bring back sooner and stop flecainide Otherwise, APP visit in 3 months for risk factor modification and diuresis optimization One year with me   Medication Adjustments/Labs and Tests Ordered: Current medicines are reviewed at length with the patient today.  Concerns regarding medicines are outlined above.  Orders Placed This Encounter  Procedures   CT CORONARY MORPH W/CTA COR W/SCORE W/CA W/CM &/OR WO/CM   Basic metabolic panel   Pro b natriuretic peptide (BNP)   EKG 12-Lead   ECHOCARDIOGRAM COMPLETE    Meds ordered this encounter  Medications   metoprolol tartrate (LOPRESSOR) 25 MG tablet    Sig: Take 1 tablet (25 mg total) by mouth once for 1 dose. Take 90-120 minutes prior to scan.    Dispense:  1 tablet    Refill:  0     Patient Instructions  Medication Instructions:  Your physician recommends that you continue on your current medications as directed. Please refer to the Current Medication list given to you today.  *If you need a refill on your cardiac medications before your next  appointment, please call your pharmacy*  Lab Work: TODAY: BMET, BNP If you have labs (blood work) drawn today and your tests are completely normal, you will receive your results only by: MyChart Message (if you have MyChart) OR A paper copy in the mail If you have any lab test that is abnormal or we need to change your treatment, we will call you to review the results.  Testing/Procedures: Your physician has requested that you have an echocardiogram. Echocardiography is a painless test that uses sound waves to create images of your heart. It provides your doctor with information about the size and shape of your heart and how well your heart's chambers and valves are working. This procedure takes approximately one hour. There are no restrictions for this procedure. Please do NOT wear cologne, perfume, aftershave, or lotions (deodorant is allowed). Please arrive 15 minutes prior to your appointment time.  Your physician has requested that you have cardiac CT (coronary CTA). Cardiac computed tomography (CT) is a painless test that uses an x-ray machine to take clear, detailed pictures of your heart. For further information please visit [email or web address] Please follow instruction sheet as given.   Follow-Up: At [facility], you and your health needs are our priority.  As part of our continuing mission to provide you with exceptional heart care, we have created designated Provider Care Teams.  These Care Teams include your primary Cardiologist (physician) and Advanced Practice Providers (APPs -  Physician Assistants and Nurse Practitioners) who all work together to provide you with the care you need, when you need it.  Your next appointment:  3 month(s)  The format for your next appointment:   In Person  Provider:   Jari Favre, PA-C, Robin Searing, NP, Eligha Bridegroom, NP, or Tereso Newcomer, PA-C   Other Instructions   Your cardiac CT will be scheduled at:   [facility] [address] [place], [place] [address] [phone]  Please arrive at the [facility] En[facility] (Entrance C2) of [facility] 30 minutes prior to test start time. You can use the FREE valet parking offered at entrance C (encouraged to control the heart rate for the test)  Proceed to the [facility] Radiology Department (first floor) to check-in and test prep.  All radiology patients and guests should use entrance C2 at [facility], accessed from [facility], even though the hospital's physical address listed is [address]    Please follow these instructions carefully (unless otherwise directed):  Hold all erectile dysfunction medications at least 3 days (72 hrs) prior to test. (Ie viagra, cialis, sildenafil, tadalafil, etc) We will administer nitroglycerin during this exam.   On the Night Before the Test: Be sure to Drink plenty of water. Do not consume any caffeinated/decaffeinated beverages or chocolate 12 hours prior to your test. Do not take any antihistamines 12 hours prior to your test.  On the Day of the Test: Drink plenty of water until 1 hour prior to the test. Do not eat any food 1 hour prior to test. You may take your regular medications prior to the test.  Take metoprolol (Lopressor) 25mg  two hours prior to test. If you take Furosemide (Lasix), please HOLD on the morning of the test.  After the Test: Drink plenty of water. After receiving IV contrast, you may experience a mild flushed feeling. This is normal. On occasion, you may experience a mild rash up to 24 hours after the test. This is not dangerous. If this occurs, you can take Benadryl 25 mg and increase your fluid intake. If you experience trouble breathing, this can be serious. If it is severe call 911 IMMEDIATELY. If it is mild, please call our office. If you take any of these medications: Glipizide/Metformin, Avandament, Glucavance, please do not take 48  hours after completing test unless otherwise instructed.  We will call to schedule your test 2-4 weeks out understanding that some insurance companies will need an authorization prior to the service being performed.   For non-scheduling related questions, please contact the cardiac imaging nurse navigator should you have any questions/concerns: Rockwell Alexandria, Cardiac Imaging Nurse Navigator Larey Brick, Cardiac Imaging Nurse Navigator [facility] Direct Office Dial: [phone]   For scheduling needs, including cancellations and rescheduling, please call [place], [phone].    Signed, Christell Constant, MD  07/25/2022 3:15 PM    [facility]

## 2022-07-25 NOTE — Patient Instructions (Addendum)
Medication Instructions:  Your physician recommends that you continue on your current medications as directed. Please refer to the Current Medication list given to you today.  *If you need a refill on your cardiac medications before your next appointment, please call your pharmacy*  Lab Work: TODAY: BMET, BNP If you have labs (blood work) drawn today and your tests are completely normal, you will receive your results only by: MyChart Message (if you have MyChart) OR A paper copy in the mail If you have any lab test that is abnormal or we need to change your treatment, we will call you to review the results.  Testing/Procedures: Your physician has requested that you have an echocardiogram. Echocardiography is a painless test that uses sound waves to create images of your heart. It provides your doctor with information about the size and shape of your heart and how well your heart's chambers and valves are working. This procedure takes approximately one hour. There are no restrictions for this procedure. Please do NOT wear cologne, perfume, aftershave, or lotions (deodorant is allowed). Please arrive 15 minutes prior to your appointment time.  Your physician has requested that you have cardiac CT (coronary CTA). Cardiac computed tomography (CT) is a painless test that uses an x-ray machine to take clear, detailed pictures of your heart. For further information please visit [email or web address] Please follow instruction sheet as given.   Follow-Up: At [facility], you and your health needs are our priority.  As part of our continuing mission to provide you with exceptional heart care, we have created designated Provider Care Teams.  These Care Teams include your primary Cardiologist (physician) and Advanced Practice Providers (APPs -  Physician Assistants and Nurse Practitioners) who all work together to provide you with the care you need, when you need it.  Your next appointment:   3  month(s)  The format for your next appointment:   In Person  Provider:   Jari Favre, PA-C, Robin Searing, NP, Eligha Bridegroom, NP, or Tereso Newcomer, PA-C   Other Instructions   Your cardiac CT will be scheduled at:   [facility] [address] [place], [place] [address] [phone]  Please arrive at the [facility] and Children's Entrance (Entrance C2) of [facility] 30 minutes prior to test start time. You can use the FREE valet parking offered at entrance C (encouraged to control the heart rate for the test)  Proceed to the [place] [facility] Department (first floor) to check-in and test prep.  All radiology patients and guests should use entrance C2 at [facility], accessed from [facility], even though the hospital's physical address listed is [address]    Please follow these instructions carefully (unless otherwise directed):  Hold all erectile dysfunction medications at least 3 days (72 hrs) prior to test. (Ie viagra, cialis, sildenafil, tadalafil, etc) We will administer nitroglycerin during this exam.   On the Night Before the Test: Be sure to Drink plenty of water. Do not consume any caffeinated/decaffeinated beverages or chocolate 12 hours prior to your test. Do not take any antihistamines 12 hours prior to your test.  On the Day of the Test: Drink plenty of water until 1 hour prior to the test. Do not eat any food 1 hour prior to test. You may take your regular medications prior to the test.  Take metoprolol (Lopressor) 25mg  two hours prior to test. If you take Furosemide (Lasix), please HOLD on the morning of the  test.  After the Test: Drink plenty of water. After receiving IV contrast, you may experience a mild flushed feeling. This is normal. On occasion, you may experience a mild rash up to 24 hours after the test. This is not dangerous. If this occurs, you can take Benadryl 25 mg and increase your fluid  intake. If you experience trouble breathing, this can be serious. If it is severe call 911 IMMEDIATELY. If it is mild, please call our office. If you take any of these medications: Glipizide/Metformin, Avandament, Glucavance, please do not take 48 hours after completing test unless otherwise instructed.  We will call to schedule your test 2-4 weeks out understanding that some insurance companies will need an authorization prior to the service being performed.   For non-scheduling related questions, please contact the cardiac imaging nurse navigator should you have any questions/concerns: Rockwell Alexandria, Cardiac Imaging Nurse Navigator Larey Brick, Cardiac Imaging Nurse Navigator [facility] Direct Office Dial: [phone]   For scheduling needs, including cancellations and rescheduling, please call [place], [phone].

## 2022-07-26 LAB — BASIC METABOLIC PANEL
BUN/Creatinine Ratio: 9 — ABNORMAL LOW (ref 10–24)
BUN: 11 mg/dL (ref 8–27)
CO2: 28 mmol/L (ref 20–29)
Calcium: 9.2 mg/dL (ref 8.6–10.2)
Chloride: 104 mmol/L (ref 96–106)
Creatinine, Ser: 1.22 mg/dL (ref 0.76–1.27)
Glucose: 93 mg/dL (ref 70–99)
Potassium: 4.9 mmol/L (ref 3.5–5.2)
Sodium: 144 mmol/L (ref 134–144)
eGFR: 58 mL/min/{1.73_m2} — ABNORMAL LOW (ref >59)

## 2022-07-26 LAB — PRO B NATRIURETIC PEPTIDE: NT-Pro BNP: 220 pg/mL (ref 0–486)

## 2022-08-05 ENCOUNTER — Telehealth: Payer: Self-pay | Admitting: Internal Medicine

## 2022-08-05 MED ORDER — METOPROLOL TARTRATE 25 MG PO TABS: 25.0000 mg | ORAL_TABLET | Freq: Once | ORAL | 0 refills | Status: DC

## 2022-08-05 NOTE — Telephone Encounter (Signed)
Called pharmacy on file reports metoprolol 25 mg 1 tablet is ready for pick up.  Called pt spouse advised of this information.  Spouse had no further questions or concerns.

## 2022-08-05 NOTE — Telephone Encounter (Signed)
Pt's medication can not be reordered because it generates a end date. Please address

## 2022-08-05 NOTE — Telephone Encounter (Signed)
Pt c/o medication issue:  1. Name of Medication:   metoprolol tartrate (LOPRESSOR) 25 MG tablet (Expired)    2. How are you currently taking this medication (dosage and times per day)?   3. Are you having a reaction (difficulty breathing--STAT)?   4. What is your medication issue? Patient's wife is calling to get medication changed to another pharmacy.   Pharm to be sent to:  [id number] - [place], [place] - [address]

## 2022-08-06 ENCOUNTER — Telehealth (HOSPITAL_COMMUNITY): Payer: Self-pay | Admitting: *Deleted

## 2022-08-06 NOTE — Telephone Encounter (Signed)
Reaching out to patient to offer assistance regarding upcoming cardiac imaging study; pt verbalizes understanding of appt date/time, parking situation and where to check in, pre-test NPO status and medications ordered, and verified current allergies; name and call back number provided for further questions should they arise  Gabrial Poppell RN Navigator Cardiac Imaging [facility] [phone] office [phone] cell  Patient to take 25mg metoprolol tartrate two hours prior to his cardiac CT scan.  He is aware to arrive at 11:30am. 

## 2022-08-07 ENCOUNTER — Encounter (HOSPITAL_COMMUNITY): Payer: Self-pay

## 2022-08-07 ENCOUNTER — Ambulatory Visit (HOSPITAL_COMMUNITY)
Admission: RE | Admit: 2022-08-07 | Discharge: 2022-08-07 | Disposition: A | Discharge status: Home / Self Care | Payer: Medicare Other | Source: Ambulatory Visit | Attending: Internal Medicine | Admitting: Internal Medicine

## 2022-08-07 DIAGNOSIS — R0602 Shortness of breath: Secondary | ICD-10-CM | POA: Diagnosis not present

## 2022-08-07 DIAGNOSIS — I2510 Atherosclerotic heart disease of native coronary artery without angina pectoris: Secondary | ICD-10-CM | POA: Diagnosis not present

## 2022-08-07 DIAGNOSIS — R072 Precordial pain: Secondary | ICD-10-CM

## 2022-08-07 DIAGNOSIS — R079 Chest pain, unspecified: Secondary | ICD-10-CM | POA: Diagnosis not present

## 2022-08-07 MED ORDER — NITROGLYCERIN 0.4 MG SL SUBL
0.8000 mg | SUBLINGUAL_TABLET | Freq: Once | SUBLINGUAL | Status: AC
Start: 2022-08-07 — End: 2022-08-07
  Administered 2022-08-07: 0.8 mg via SUBLINGUAL

## 2022-08-07 MED ORDER — NITROGLYCERIN 0.4 MG SL SUBL
SUBLINGUAL_TABLET | SUBLINGUAL | Status: AC
  Filled 2022-08-07: qty 2

## 2022-08-07 MED ORDER — IOHEXOL 350 MG/ML SOLN
95.0000 mL | Freq: Once | INTRAVENOUS | Status: AC | PRN
  Administered 2022-08-07: 95 mL via INTRAVENOUS

## 2022-08-27 ENCOUNTER — Ambulatory Visit (HOSPITAL_COMMUNITY): Payer: Medicare Other | Attending: Internal Medicine

## 2022-08-27 DIAGNOSIS — R0602 Shortness of breath: Secondary | ICD-10-CM | POA: Diagnosis not present

## 2022-08-27 LAB — ECHOCARDIOGRAM COMPLETE
Area-P 1/2: 3.65 cm2
S' Lateral: 3.6 cm

## 2022-08-29 ENCOUNTER — Telehealth: Payer: Self-pay | Admitting: *Deleted

## 2022-08-29 DIAGNOSIS — E785 Hyperlipidemia, unspecified: Secondary | ICD-10-CM

## 2022-08-29 DIAGNOSIS — Z79899 Other long term (current) drug therapy: Secondary | ICD-10-CM

## 2022-08-29 MED ORDER — EZETIMIBE 10 MG PO TABS: 10.0000 mg | ORAL_TABLET | Freq: Every day | ORAL | 2 refills | Status: DC

## 2022-08-29 NOTE — Telephone Encounter (Signed)
RE: Cholesterole meds Received: Today [name] Constant, MD  Loa Socks, LPN Cc: [name] Burows, RN For both [facility] Stop statin, start zetia 10 mg, fasting lipids and ALT in three months. [name] [name] is on Rosuvastatin (see echo result note) [name] [name] is on simvastatin  Thanks, MAC   Pts wife made aware via mychart message through her own mychart account that Dr. Izora Ribas will discontinue the pts rosuvastatin and we will send in new medication zetia 10 mg po daily.  Wife is aware that the pt will need to have repeat lipids/ALT in 3 months to reassess. Wife is aware that we scheduled both hers and this pts repeat labs in 3 months for 12/01/22.    Confirmed the pharmacy of choice with the pts wife via [employer].  She agreed to plan for both her and [name] [name].

## 2022-09-23 ENCOUNTER — Other Ambulatory Visit: Payer: Self-pay

## 2022-09-23 ENCOUNTER — Encounter: Payer: Self-pay | Admitting: Internal Medicine

## 2022-09-23 DIAGNOSIS — E785 Hyperlipidemia, unspecified: Secondary | ICD-10-CM

## 2022-09-23 DIAGNOSIS — Z79899 Other long term (current) drug therapy: Secondary | ICD-10-CM

## 2022-09-23 NOTE — Telephone Encounter (Signed)
Orders placed for Lipid Clinic per PharmD. Conversion from wives MyChart encounter on 09/23/22.

## 2022-10-02 DIAGNOSIS — L820 Inflamed seborrheic keratosis: Secondary | ICD-10-CM | POA: Diagnosis not present

## 2022-10-02 DIAGNOSIS — L570 Actinic keratosis: Secondary | ICD-10-CM | POA: Diagnosis not present

## 2022-10-02 DIAGNOSIS — C44311 Basal cell carcinoma of skin of nose: Secondary | ICD-10-CM | POA: Diagnosis not present

## 2022-10-03 DIAGNOSIS — R609 Edema, unspecified: Secondary | ICD-10-CM | POA: Diagnosis not present

## 2022-10-03 DIAGNOSIS — R053 Chronic cough: Secondary | ICD-10-CM | POA: Diagnosis not present

## 2022-10-07 ENCOUNTER — Other Ambulatory Visit: Payer: Self-pay

## 2022-10-07 MED ORDER — FLECAINIDE ACETATE 50 MG PO TABS: 50.0000 mg | ORAL_TABLET | Freq: Two times a day (BID) | ORAL | 3 refills | Status: DC

## 2022-10-12 ENCOUNTER — Other Ambulatory Visit: Payer: Self-pay | Admitting: Nurse Practitioner

## 2022-10-12 DIAGNOSIS — I480 Paroxysmal atrial fibrillation: Secondary | ICD-10-CM

## 2022-10-13 NOTE — Telephone Encounter (Signed)
Prescription refill request for Eliquis received. Indication:afib Last office visit:5/24 Scr:1.22  5/24 Age: 82 Weight:117.9  kg  Prescription refilled

## 2022-10-16 ENCOUNTER — Ambulatory Visit: Payer: Medicare Other

## 2022-10-16 NOTE — Progress Notes (Deleted)
Patient ID: [name]                 DOB: 03/05/1941                    MRN: [id number]     HPI: [name] [name] is a 82 y.o. male patient referred to lipid clinic by Dr. Izora [name]. PMH is significant for PAF - on Flecainide, chronic anticoagulation with Eliquis and on chronic Tambocor therapy, CAD per cath in 2017 with non obstructive disease noted, OSA, HTN, and CKD. 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    Last visit with Dr. Izora [name] on 07/25/22, pt reported new chest pain and increased exertional fatigue so coronary CT was ordered which showed a score of 882 (60th percentile for age and sex) and ECHO which showed normal LVEF. Since that visit pt and his wife reported via phone 08/29/22 muscle aches with statin and wanted to try ezetimibe. Since starting ezetimibe pt reported increased leg swelling on 09/23/2022 and was instructed to restart statin until appt with pharmacy lipid clinic.  Current Medications: rosuvastatin 20 mg Intolerances: ezetimibe (leg swelling) Risk Factors: Coronary Ca score on 08/07/2022 of 882 (60th percentile for age and sex) LDL goal: <55 mg/dl  Diet:   Exercise:   Family History:   Social History:   Labs: 03/21/22 - TC 123, TG 85, HDL 58, LDL 49 - rosuvastatin 20 mg   Past Medical History:  Diagnosis Date   A-fib (HCC)    Angina    chest pain- cardiac cath. followed in 2010, record available ,  told then that he should f/u /w Dr. Manus [name]     Arthritis    R knee, back     Asthma    uses  singulair   Cancer [employer])    prostate, melanoma- 2010, excision     CHF (congestive heart failure) (HCC)    Chronic kidney disease    prostate cancer - surg. removal- 1996   Colon polyps    Diverticulitis    Dysrhythmia    palpitations, followed by [name] [name], seen in prep for surgery on 03/27/2011    GERD (gastroesophageal reflux disease)    Hepatitis    jaundice- many yrs. ago   Hiatal hernia    Hypertension     Melanoma (HCC)    Face   OSA (obstructive sleep apnea) 04/11/2015   Mild to moderate OSA with AHI 13.3/hr overall and AHI 36.8/hr during REM sleep.  Oxygen saturations were as low as 86% during respiratory events.   Pneumonia    hosp. 20 yrs. ago   Prostate cancer (HCC) 1995   Sleep apnea    study done, 10 yrs. ago, told that he needed  CPAP but  never used     Current Outpatient Medications on File Prior to Visit  Medication Sig Dispense Refill   acetaminophen (TYLENOL) 500 MG tablet Take 2 tablets (1,000 mg total) by mouth every 6 (six) hours as needed. 30 tablet 0   albuterol (PROVENTIL) (2.5 MG/3ML) 0.083% nebulizer solution Take 2.5 mg by nebulization every 4 (four) hours as needed for shortness of breath.     albuterol (VENTOLIN HFA) 108 (90 Base) MCG/ACT inhaler Inhale 1-2 puffs into the lungs every 6 (six) hours as needed for wheezing or shortness of breath. 18 g 0   ammonium lactate (LAC-HYDRIN) 12 % lotion daily at 6 (six) AM.     Ascorbic Acid (VITAMIN  C) 1000 MG tablet Take 1,000 mg by mouth daily.     azithromycin (ZITHROMAX) 250 MG tablet Take 250 mg by mouth 3 (three) times a week.     benzonatate (TESSALON) 100 MG capsule Take 200 mg by mouth daily as needed for cough.     Budeson-Glycopyrrol-Formoterol (BREZTRI AEROSPHERE) 160-9-4.8 MCG/ACT AERO Inhale 2 puffs into the lungs in the morning and at bedtime.     Calcium Carbonate-Vitamin D 600-200 MG-UNIT TABS Take 1 tablet by mouth 2 (two) times daily.     ELIQUIS 5 MG TABS tablet TAKE 1 TABLET BY MOUTH TWICE  DAILY 200 tablet 2   ezetimibe (ZETIA) 10 MG tablet Take 1 tablet (10 mg total) by mouth daily. 90 tablet 2   flecainide (TAMBOCOR) 50 MG tablet Take 1 tablet (50 mg total) by mouth 2 (two) times daily. 180 tablet 3   fluticasone (FLONASE) 50 MCG/ACT nasal spray Place 1 spray into both nostrils daily as needed for allergies or rhinitis.     furosemide (LASIX) 20 MG tablet Take one (1) tablet by mouth ( 20 mg) daily. 90  tablet 3   metoprolol tartrate (LOPRESSOR) 25 MG tablet Take 1 tablet (25 mg total) by mouth once for 1 dose. Take 90-120 minutes prior to scan. 1 tablet 0   montelukast (SINGULAIR) 10 MG tablet Take 10 mg by mouth daily.     Multiple Vitamin (MULTIVITAMIN WITH MINERALS) TABS tablet Take 1 tablet by mouth daily.     niacinamide 500 MG tablet Take 500 mg by mouth 2 (two) times daily with a meal.     Omega-3 Fatty Acids (FISH OIL) 1200 MG CAPS Take 1,200 mg by mouth 2 (two) times daily.     omeprazole (PRILOSEC) 40 MG capsule Take 40 mg by mouth at bedtime.      polyvinyl alcohol (LIQUIFILM TEARS) 1.4 % ophthalmic solution Place 1 drop into both eyes as needed for dry eyes. 15 mL 0   telmisartan (MICARDIS) 80 MG tablet Take 80 mg by mouth daily.     No current facility-administered medications on file prior to visit.    Allergies  Allergen Reactions   Carvedilol Shortness Of Breath and Other (See Comments)    Fatigue   Tiotropium Rash   Zolpidem Other (See Comments)    Hallucinations   Morphine Other (See Comments)   Spiriva Respimat [Tiotropium Bromide Monohydrate] Other (See Comments)   Aminophylline Other (See Comments)    Unknown   Atenolol Other (See Comments)    Fatigue, Low HR   Lisinopril Cough    Assessment/Plan:  1. Hyperlipidemia -  Reviewed options for lowering LDL cholesterol, including statins, ezetimibe, PCSK9 inhibitors, Nexletol/Nexlizet, and Leqvio. Discussed mechanisms of action, dosing, side effects and potential decreases in LDL cholesterol. Also reviewed cost information and potential options for patient assistance.

## 2022-10-24 ENCOUNTER — Other Ambulatory Visit: Payer: Self-pay

## 2022-10-24 MED ORDER — FLECAINIDE ACETATE 50 MG PO TABS: 50.0000 mg | ORAL_TABLET | Freq: Two times a day (BID) | ORAL | 2 refills | Status: DC

## 2022-10-28 ENCOUNTER — Ambulatory Visit: Payer: Medicare Other | Admitting: Nurse Practitioner

## 2022-10-28 ENCOUNTER — Encounter: Payer: Self-pay | Admitting: Nurse Practitioner

## 2022-10-28 VITALS — BP 138/74 | HR 67 | Ht 67.0 in | Wt 265.8 lb

## 2022-10-28 DIAGNOSIS — Z79899 Other long term (current) drug therapy: Secondary | ICD-10-CM | POA: Diagnosis not present

## 2022-10-28 DIAGNOSIS — E785 Hyperlipidemia, unspecified: Secondary | ICD-10-CM

## 2022-10-28 DIAGNOSIS — I10 Essential (primary) hypertension: Secondary | ICD-10-CM

## 2022-10-28 DIAGNOSIS — I480 Paroxysmal atrial fibrillation: Secondary | ICD-10-CM | POA: Diagnosis not present

## 2022-10-28 DIAGNOSIS — R0602 Shortness of breath: Secondary | ICD-10-CM

## 2022-10-28 DIAGNOSIS — I2510 Atherosclerotic heart disease of native coronary artery without angina pectoris: Secondary | ICD-10-CM | POA: Diagnosis not present

## 2022-10-28 DIAGNOSIS — I5032 Chronic diastolic (congestive) heart failure: Secondary | ICD-10-CM | POA: Diagnosis not present

## 2022-10-28 MED ORDER — EMPAGLIFLOZIN 10 MG PO TABS: 10.0000 mg | ORAL_TABLET | Freq: Every day | ORAL | 0 refills | Status: DC

## 2022-10-28 MED ORDER — EMPAGLIFLOZIN 10 MG PO TABS: 10.0000 mg | ORAL_TABLET | Freq: Every day | ORAL | 9 refills | Status: DC

## 2022-10-28 NOTE — Progress Notes (Signed)
Cardiology Office Note:  .   Date:  10/28/2022  ID:  Karman, Ziman [birth year], MRN [id number] PCP: Blair Heys, MD  [facility] Providers Cardiologist:  Christell Constant, MD    Patient Profile: .      PMH PAF On chronic anticoagulation with Eliquis CHA2DS2-VASc score of 4 On Flecainide CAD Non-obstructive disease by cath 2017  OSA Hypertension CKD Dyslipidemia First-degree heart block Morbid obesity Statin myalgias Chronic back pain  Nonobstructive CAD per cath in 2017.  Mildly to moderately elevated left ventricular filling pressures on cath. Notes indicate he has Merkel cell carcinoma and lung nodule.  He has been seeing pulmonology at [facility].  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  2D echo 10/08/2020 revealed LVEF 55 to 60%, no RWMA, indeterminate diastolic parameters, RV systolic function mildly reduced, moderately elevated PASP.  Per Dr. Katrinka Blazing, no change from previous echo 16 months prior, moderate pulmonary hypertension.  He has been followed by Dr. Katrinka Blazing and A Fib clinic.   Last cardiology clinic visit was 07/25/2022 with Dr. Raynelle Jan.  He reported he was doing okay until the previous few days.  He was having more exertional fatigue and chest pain.  EKG revealed first-degree heart block with QT interval 435.  Due to concern for ischemia, he underwent coronary CTA on 08/07/2022 which revealed nonobstructive CAD, coronary calcium score of 882 (60th percentile).  TTE 08/27/2022 revealed normal LV function 60 to 65%, no RWMA, mild LVH, G1 DD, mildly elevated PASP, no significant valve disease.  There was mild dilatation of ascending aorta noted, however this was normal on CT.       History of Present Illness: .   [name] [name] is a pleasant 82 y.o. male who is here today for follow-up of exertional fatigue. He is accompanied by his wife. He reports he is coughing a lot and gets short of  breath walking a short distance, also his hips start hurting. Has seen pulmonologist for cough and she prescribed an antibiotic 3 days per week. Wife reports he got better for awhile. Has not felt well since initial COVID diagnosis 2 years ago, he's had a total of 4 infections. He also had radiation for lung cancer. He had work up for ischemia after seeing Dr. Izora Ribas 07/25/22 with no significant abnormality identified. Patient stopped rosuvastatin due to back and hip pain. He says his back and hip pain did not improve off rosuvastatin but his wife thinks it did improve. Ezetimibe made his feet swell. His back now hurts all the time, has had 11 back injections. Home BP typically 127-130 over 68.   ROS: See HPI       Studies Reviewed: .         Risk Assessment/Calculations:    CHA2DS2-VASc Score = 4   This indicates a 4.8% annual risk of stroke. The patient's score is based upon: CHF History: 1 HTN History: 1 Diabetes History: 0 Stroke History: 0 Vascular Disease History: 0 Age Score: 2 Gender Score: 0            Physical Exam:   VS:  BP 138/74   Pulse 67   Ht 5\' 7"  (1.702 m)   Wt 265 lb 12.8 oz (120.6 kg)   SpO2 97%   BMI 41.63 kg/m    Wt Readings from Last 3 Encounters:  10/28/22 265 lb 12.8 oz (120.6 kg)  07/25/22 260 lb (117.9 kg)  01/23/22 257  lb (116.6 kg)    GEN: Well nourished, well developed in no acute distress NECK: No JVD; No carotid bruits CARDIAC: RRR, no murmurs, rubs, gallops RESPIRATORY:  Clear to auscultation without rales, wheezing or rhonchi  ABDOMEN: Soft, non-tender, protuberant EXTREMITIES:  Bilateral 2+ edema; No deformity     ASSESSMENT AND PLAN: .    Chronic HFpEF/Shortness of breath: LVEF 60-65%, mild LVH, G1DD, normal RV on echo 08/27/22. He is having increased shortness of breath and cough, timeline unclear. Reported these symptoms to Dr. Izora Ribas at previous office visit 07/25/22. He pursued ischemia evaluation which did not reveal  cardiac indication for significant shortness of breath. This is likely multifactorial in the setting of deconditioning, obesity, history of lung cancer, and history of Covid infection. He has bilateral LE edema. Cannot lay flat but this has been ongoing for many years, felt also to be exacerbated by chronic back pain. He denies PND. He reports brisk urine output on Lasix. We will add Jardiance 10 mg daily for GDMT for HFpEF. Weight is up 5 lbs since last visit but he does not monitor consistently. Continue telmisartan and furosemide. We will check BMP in 2 weeks.   CAD: Nonobstructive CAD by cath in 2017 and on coronary CTA 08/07/22. He continues to have shortness of breath as noted above. No chest pain. As noted above, his SOB is likely multifactorial. Secondary prevention emphasized including increasing physical activity, weight loss, and good BP and cholesterol control.  He has been intolerant to statins and ezetimibe. Is scheduled to see Pharm D for lipid clinic in a few days.  He is not on aspirin due to need for [facility]  We will continue telmisartan. Adding empaglifozin for management of CHF as noted above.   PAF:  Clinically appears to be maintaining sinus rhythm today. HR is well controlled.  He is unaware of A-fib.  Denies tachypalpitations.  No bleeding concerns.  Continue Eliquis 5 mg twice daily which is appropriate dose for stroke prevention for CHA2DS2-VASc score of 4. Remains on flecainide as there is no evidence of obstructive CAD.   Dyslipidemia: LDL-C 49 on 03/21/22, improved after starting rosuvastatin.  He reports increased hip and back pain on rosuvastatin, but upon further discussion it is not clear that this improved while off rosuvastatin.  He thinks he has resumed it. States ezetimibe made his legs swell.  He was [facility] to [facility] for lipid management by Dr. Raynelle Jan and has an appointment in a few days.   Hypertension: BP elevated initially but improved on my recheck.          Dispo: TBD following Pharm D appointment to best coordinate with repeat lab testing  Signed, Eligha Bridegroom, NP-C

## 2022-10-28 NOTE — Patient Instructions (Signed)
Medication Instructions:   START Jardiance one (1) tablet by mouth (10 mg ) daily.   *If you need a refill on your cardiac medications before your next appointment, please call your pharmacy*   Lab Work:  Your physician recommends that you return for lab work on Tuesday, September 3. You can come in on the day of your appointment anytime between 7:30-4:30.  If you have labs (blood work) drawn today and your tests are completely normal, you will receive your results only by: MyChart Message (if you have MyChart) OR A paper copy in the mail If you have any lab test that is abnormal or we need to change your treatment, we will call you to review the results.   Testing/Procedures:  None ordered.   Follow-Up: At [facility], you and your health needs are our priority.  As part of our continuing mission to provide you with exceptional heart care, we have created designated Provider Care Teams.  These Care Teams include your primary Cardiologist (physician) and Advanced Practice Providers (APPs -  Physician Assistants and Nurse Practitioners) who all work together to provide you with the care you need, when you need it.  Other Instructions   Keep all follow up appointments.

## 2022-10-29 NOTE — Progress Notes (Unsigned)
Patient ID: [name] [name]                 DOB: 21-Mar-1940                    MRN: [id number]     HPI: [name] [name] is a 82 y.o. male patient referred to lipid clinic by Dr. Izora Ribas. PMH is significant for PAF, HFpEF, nonobstructive CAD per cath in 2017, OSA, HTN, CKD, and Merkel cell carcinoma. Last visit with Dr. Izora Ribas on 07/25/22, pt reported new chest pain and increased exertional fatigue so coronary CT was ordered which showed a score of 882 (60th percentile for age and sex) and echo which showed LVEF 60-65%, grade I diastolic dysfunction, left ventricular hypertrophy. Since that visit pt and his wife reported via phone 08/29/22 muscle aches with statin and wanted to try ezetimibe. Since starting ezetimibe pt reported increased leg swelling on 09/23/2022 and was instructed to restart statin until appt with pharmacy lipid clinic. Had a visit with Marcelino Duster on 8/20 for HFpEF and SOB. Started on Jardiance at that visit, all other meds were continued.   Pt reports to clinic today with his wife who also has a visit today. Joint pain did not get better when he was off of rosuvastatin. He attributes his joint pain to aging. He has resumed his rosuvastatin and is tolerating well. Thought ezetimibe was causing increased leg swelling, but notes swelling did not improve after stopping the medication. Saw his PCP for this and reports they advised him to increase physical activity and try to lose weight. Started on Jardiance earlier this week and has had increased urine output since then. Reports improvement in chest congestion and LE edema since then as well. Jardiance co-pay was high because he is in the donut hole. Reports that he sleeps in a recliner every night as he cannot lay flat due to SOB and thinks he has even been sleeping better since starting on Jardiance. Reports taking Lasix 20 mg daily except on Sundays (does not want to take it when he goes to church because of the increased  urination). BP at home usually 130s/80s. Pt's wife manages medications.  Current HLD Medications: rosuvastatin 20mg  daily Intolerances: ezetimibe (leg swelling) Risk Factors: Coronary Ca score on 08/07/2022 of 882 (60th percentile for age and sex), HTN, CKD LDL goal: <55 mg/dl  Current HF medications: Jardiance 10 mg daily, telmisartan 80 mg daily, furosemide 20 mg daily except Sundays BP Goal: <130/80  Diet: eat out sometimes and will save leftovers 2 meals/day Breakfast: eggs, bacon Lunch: sandwiches with lunch meat Dinner: meat, veggies from son's garden, chicken, beef, hamburger  Exercise: none  Family History: Mother- breast cancer; father- Alzheimer's; brother- Chron's disease; brother- heart disease, HTN  Social History: former smoker (quit 1985), no alcohol  Labs: 03/21/22: TC 123, TG 85, HDL 58, LDL 49 (rosuvastatin 20 mg) 06/14/2019: TC 159, TG 89, HDL 40, LDL102 (no LLT) 02/28/2017: TC 182, TG 52, HDL 52, LDL120 (no LLT)  Home BP Readings: 130s/80s  Past Medical History:  Diagnosis Date   A-fib (HCC)    Angina    chest pain- cardiac cath. followed in 2010, record available ,  told then that he should f/u /w Dr. Manus Gunning     Arthritis    R knee, back     Asthma    uses  singulair   Cancer [facility])    prostate, melanoma- 2010, excision     CHF (congestive heart  failure) (HCC)    Chronic kidney disease    prostate cancer - surg. removal- 1996   Colon polyps    Diverticulitis    Dysrhythmia    palpitations, followed by Dr.Ehinger, seen in prep for surgery on 03/27/2011    GERD (gastroesophageal reflux disease)    Hepatitis    jaundice- many yrs. ago   Hiatal hernia    Hypertension    Melanoma (HCC)    Face   OSA (obstructive sleep apnea) 04/11/2015   Mild to moderate OSA with AHI 13.3/hr overall and AHI 36.8/hr during REM sleep.  Oxygen saturations were as low as 86% during respiratory events.   Pneumonia    hosp. 20 yrs. ago   Prostate cancer (HCC) 1995    Sleep apnea    study done, 10 yrs. ago, told that he needed  CPAP but  never used     Current Outpatient Medications on File Prior to Visit  Medication Sig Dispense Refill   acetaminophen (TYLENOL) 500 MG tablet Take 2 tablets (1,000 mg total) by mouth every 6 (six) hours as needed. 30 tablet 0   albuterol (PROVENTIL) (2.5 MG/3ML) 0.083% nebulizer solution Take 2.5 mg by nebulization every 4 (four) hours as needed for shortness of breath.     albuterol (VENTOLIN HFA) 108 (90 Base) MCG/ACT inhaler Inhale 1-2 puffs into the lungs every 6 (six) hours as needed for wheezing or shortness of breath. 18 g 0   ammonium lactate (LAC-HYDRIN) 12 % lotion daily at 6 (six) AM.     Ascorbic Acid (VITAMIN C) 1000 MG tablet Take 1,000 mg by mouth daily.     azithromycin (ZITHROMAX) 250 MG tablet Take 250 mg by mouth 3 (three) times a week.     benzonatate (TESSALON) 100 MG capsule Take 200 mg by mouth daily as needed for cough.     Budeson-Glycopyrrol-Formoterol (BREZTRI AEROSPHERE) 160-9-4.8 MCG/ACT AERO Inhale 2 puffs into the lungs in the morning and at bedtime.     Calcium Carbonate-Vitamin D 600-200 MG-UNIT TABS Take 1 tablet by mouth 2 (two) times daily.     ELIQUIS 5 MG TABS tablet TAKE 1 TABLET BY MOUTH TWICE  DAILY 200 tablet 2   empagliflozin (JARDIANCE) 10 MG TABS tablet Take 1 tablet (10 mg total) by mouth daily. 30 tablet 9   empagliflozin (JARDIANCE) 10 MG TABS tablet Take 1 tablet (10 mg total) by mouth daily before breakfast. 28 tablet 0   flecainide (TAMBOCOR) 50 MG tablet Take 1 tablet (50 mg total) by mouth 2 (two) times daily. 180 tablet 2   fluticasone (FLONASE) 50 MCG/ACT nasal spray Place 1 spray into both nostrils daily as needed for allergies or rhinitis.     furosemide (LASIX) 20 MG tablet Take one (1) tablet by mouth ( 20 mg) daily. 90 tablet 3   montelukast (SINGULAIR) 10 MG tablet Take 10 mg by mouth daily.     Multiple Vitamin (MULTIVITAMIN WITH MINERALS) TABS tablet Take 1 tablet  by mouth daily.     niacinamide 500 MG tablet Take 500 mg by mouth 2 (two) times daily with a meal.     Omega-3 Fatty Acids (FISH OIL) 1200 MG CAPS Take 1,200 mg by mouth 2 (two) times daily.     omeprazole (PRILOSEC) 40 MG capsule Take 40 mg by mouth at bedtime.      polyvinyl alcohol (LIQUIFILM TEARS) 1.4 % ophthalmic solution Place 1 drop into both eyes as needed for dry eyes. 15 mL 0  rosuvastatin (CRESTOR) 20 MG tablet Take 20 mg by mouth daily.     telmisartan (MICARDIS) 80 MG tablet Take 80 mg by mouth daily.     No current facility-administered medications on file prior to visit.    Allergies  Allergen Reactions   Carvedilol Shortness Of Breath and Other (See Comments)    Fatigue   Tiotropium Rash   Zolpidem Other (See Comments)    Hallucinations   Morphine Other (See Comments)   Spiriva Respimat [Tiotropium Bromide Monohydrate] Other (See Comments)   Aminophylline Other (See Comments)    Unknown   Atenolol Other (See Comments)    Fatigue, Low HR   Lisinopril Cough    Assessment/Plan:  1. Hyperlipidemia - Well controlled based on last LDL 49 in January. Will continue rosuvastatin 20 mg daily as he is tolerating well and it does not appear joint pain is related to statin. Follow up lipid panel due next year.   2. HFpEF - BP elevated today and pt remains symptomatic with room to optimize HF GDMT. Will switch telmisartan 80 mg daily to Entresto 97-103 mg BID given elevated BP and continued HF symptoms.  Will continue Jardiance as he has had a great response with improvement in LE edema and pt reported chest congestion. Will continue furosemide 20 mg daily except Sundays as he still has LE edema and chest congestion. Can decrease dose or discontinue in the future if needed given recent addition of Jardiance and now Entresto. Enrolled pt in [employer] which will make Jardiance and Sherryll Burger free and provided him with information to show to pharmacy. Follow up PharmD  appt on 9/4 for BP check and labs.  Adam Phenix, PharmD PGY-1 Pharmacy Resident  Megan E. Supple, PharmD, BCACP, CPP [facility] [address] [place], [place] [address] Phone: [phone]; Fax: [phone] 10/31/2022 4:05 PM

## 2022-10-31 ENCOUNTER — Ambulatory Visit: Payer: Medicare Other | Attending: Interventional Cardiology | Admitting: Pharmacist

## 2022-10-31 VITALS — BP 150/72 | HR 58 | Wt 264.8 lb

## 2022-10-31 DIAGNOSIS — I10 Essential (primary) hypertension: Secondary | ICD-10-CM | POA: Diagnosis not present

## 2022-10-31 MED ORDER — ENTRESTO 97-103 MG PO TABS: 1.0000 | ORAL_TABLET | Freq: Two times a day (BID) | ORAL | 5 refills | Status: DC

## 2022-10-31 MED ORDER — ROSUVASTATIN CALCIUM 20 MG PO TABS: 20.0000 mg | ORAL_TABLET | Freq: Every day | ORAL | 3 refills | Status: DC

## 2022-10-31 NOTE — Patient Instructions (Addendum)
Stay on your rosuvastatin for your cholesterol  Stop taking telmisartan. Start taking Entresto - 1 tablet twice daily.   Recheck blood pressure and labs on Wednesday, September 4th at [employer] have been enrolled in the [employer]. Please show the below information to your pharmacy. They will need to add this and run it like a secondary insurance after your [employer]. It will work for both your Jardiance and [place] and will make both medications free.  CARD NO. [id number]   BIN [id number]   PCN [id number]   GROUP [id number]

## 2022-11-05 ENCOUNTER — Other Ambulatory Visit: Payer: Self-pay | Admitting: Nurse Practitioner

## 2022-11-11 ENCOUNTER — Other Ambulatory Visit: Payer: Medicare Other

## 2022-11-11 NOTE — Progress Notes (Unsigned)
Patient ID: [name] [name]                 DOB: 02/13/1941                    MRN: [id number]     HPI: [name] [name] is a 82 y.o. male patient referred to pharmacy clinic by Dr. Izora Ribas. PMH is significant for PAF, HFpEF, nonobstructive CAD per cath in 2017, OSA, HTN, CKD, and Merkel cell carcinoma. Last visit with Dr. Izora Ribas on 07/25/22, pt reported new chest pain and increased exertional fatigue so coronary CT was ordered which showed a score of 882 (60th percentile for age and sex) and echo which showed LVEF 60-65%, G1DD, LVH. HE saw Eligha Bridegroom on 10/28/22 for HFpEF and SOB and was started on Jardiance at the time.  I saw pt on 8/23 for lipid management. He ultimately resumed his rosuvastatin 20mg  daily which he previously tolerated well and which controlled his cholesterol. He reported improvement in chest congestion and LE edema since starting Jardiance. He sleeps in a recliner every night as he cannot lay flat due to SOB and had been sleeping better since starting on Jardiance. His copay was high due to being in the donut hole. His BP was elevated at 150/72 and I changed his telmisartan to [place], and enrolled him in the [employer] to make both his [place] and [place] free.  Pt presents today with his wife for follow up. Tolerating his medications well. Denies dizziness (unless he's coughing) or headache. LE edema and SOB are unchanged from last visit. Takes his Lasix daily except on Sundays (doesn't like taking on church days). His wife manages his medications. Brings in a list of home BP readings: 103/51, 142/91, 118/71, 115/74, 111/72, 139/73 - checks after taking his medications. Took his meds this AM already.  Current HF medications: Jardiance 10 mg daily, Entresto 97-103mg  BID, furosemide 20 mg daily except Sundays BP Goal: <130/80  Diet: eat out sometimes and will save leftovers 2 meals/day Breakfast: eggs, bacon Lunch: sandwiches with lunch  meat Dinner: meat, veggies from son's garden, chicken, beef, hamburger  Exercise: none  Family History: Mother- breast cancer; father- Alzheimer's; brother- Chron's disease; brother- heart disease, HTN  Social History: former smoker (quit 1985), no alcohol  Labs: 03/21/22: TC 123, TG 85, HDL 58, LDL 49 (rosuvastatin 20 mg) 06/14/2019: TC 159, TG 89, HDL 40, LDL102 (no LLT) 02/28/2017: TC 182, TG 52, HDL 52, LDL120 (no LLT)   Past Medical History:  Diagnosis Date   A-fib (HCC)    Angina    chest pain- cardiac cath. followed in 2010, record available ,  told then that he should f/u /w Dr. Manus Gunning     Arthritis    R knee, back     Asthma    uses  singulair   Cancer [facility])    prostate, melanoma- 2010, excision     CHF (congestive heart failure) (HCC)    Chronic kidney disease    prostate cancer - surg. removal- 1996   Colon polyps    Diverticulitis    Dysrhythmia    palpitations, followed by Dr.Ehinger, seen in prep for surgery on 03/27/2011    GERD (gastroesophageal reflux disease)    Hepatitis    jaundice- many yrs. ago   Hiatal hernia    Hypertension    Melanoma (HCC)    Face   OSA (obstructive sleep apnea) 04/11/2015   Mild to moderate OSA with  AHI 13.3/hr overall and AHI 36.8/hr during REM sleep.  Oxygen saturations were as low as 86% during respiratory events.   Pneumonia    hosp. 20 yrs. ago   Prostate cancer (HCC) 1995   Sleep apnea    study done, 10 yrs. ago, told that he needed  CPAP but  never used     Current Outpatient Medications on File Prior to Visit  Medication Sig Dispense Refill   acetaminophen (TYLENOL) 500 MG tablet Take 2 tablets (1,000 mg total) by mouth every 6 (six) hours as needed. 30 tablet 0   albuterol (PROVENTIL) (2.5 MG/3ML) 0.083% nebulizer solution Take 2.5 mg by nebulization every 4 (four) hours as needed for shortness of breath.     albuterol (VENTOLIN HFA) 108 (90 Base) MCG/ACT inhaler Inhale 1-2 puffs into the lungs every 6 (six) hours as  needed for wheezing or shortness of breath. 18 g 0   ammonium lactate (LAC-HYDRIN) 12 % lotion daily at 6 (six) AM.     Ascorbic Acid (VITAMIN C) 1000 MG tablet Take 1,000 mg by mouth daily.     azithromycin (ZITHROMAX) 250 MG tablet Take 250 mg by mouth 3 (three) times a week.     benzonatate (TESSALON) 100 MG capsule Take 200 mg by mouth daily as needed for cough.     Budeson-Glycopyrrol-Formoterol (BREZTRI AEROSPHERE) 160-9-4.8 MCG/ACT AERO Inhale 2 puffs into the lungs in the morning and at bedtime.     Calcium Carbonate-Vitamin D 600-200 MG-UNIT TABS Take 1 tablet by mouth 2 (two) times daily.     ELIQUIS 5 MG TABS tablet TAKE 1 TABLET BY MOUTH TWICE  DAILY 200 tablet 2   empagliflozin (JARDIANCE) 10 MG TABS tablet Take 1 tablet (10 mg total) by mouth daily. 30 tablet 9   flecainide (TAMBOCOR) 50 MG tablet Take 1 tablet (50 mg total) by mouth 2 (two) times daily. 180 tablet 2   fluticasone (FLONASE) 50 MCG/ACT nasal spray Place 1 spray into both nostrils daily as needed for allergies or rhinitis.     furosemide (LASIX) 20 MG tablet Take one (1) tablet by mouth ( 20 mg) daily. 90 tablet 3   montelukast (SINGULAIR) 10 MG tablet Take 10 mg by mouth daily.     Multiple Vitamin (MULTIVITAMIN WITH MINERALS) TABS tablet Take 1 tablet by mouth daily.     niacinamide 500 MG tablet Take 500 mg by mouth 2 (two) times daily with a meal.     Omega-3 Fatty Acids (FISH OIL) 1200 MG CAPS Take 1,200 mg by mouth 2 (two) times daily.     omeprazole (PRILOSEC) 40 MG capsule Take 40 mg by mouth at bedtime.      polyvinyl alcohol (LIQUIFILM TEARS) 1.4 % ophthalmic solution Place 1 drop into both eyes as needed for dry eyes. 15 mL 0   rosuvastatin (CRESTOR) 20 MG tablet TAKE 1 TABLET BY MOUTH DAILY 90 tablet 3   sacubitril-valsartan (ENTRESTO) 97-103 MG Take 1 tablet by mouth 2 (two) times daily. 60 tablet 5   No current facility-administered medications on file prior to visit.    Allergies  Allergen  Reactions   Carvedilol Shortness Of Breath and Other (See Comments)    Fatigue   Tiotropium Rash   Zolpidem Other (See Comments)    Hallucinations   Morphine Other (See Comments)   Spiriva Respimat [Tiotropium Bromide Monohydrate] Other (See Comments)   Aminophylline Other (See Comments)    Unknown   Atenolol Other (See Comments)  Fatigue, Low HR   Lisinopril Cough    Assessment/Plan:  1. HFpEF - BP notably improved and is at goal < 130/37mmHg today and with majority of home readings. Will check BMET today with plan to continue Entresto 97-103 mg BID, Jardiance 10mg  daily, and furosemide 20 mg daily except Sundays. His Jardiance and Sherryll Burger are free through the [employer] that I enrolled him at his last appt. F/u with PharmD as needed.  Seth Higginbotham E. Luciann Gossett, PharmD, BCACP, CPP [facility] [address] [place], [place] [address] Phone: [phone]; Fax: [phone] 11/11/2022 2:45 PM

## 2022-11-12 ENCOUNTER — Ambulatory Visit: Payer: Medicare Other | Attending: Internal Medicine | Admitting: Pharmacist

## 2022-11-12 ENCOUNTER — Ambulatory Visit: Payer: Medicare Other

## 2022-11-12 VITALS — BP 128/64 | HR 63 | Wt 264.0 lb

## 2022-11-12 DIAGNOSIS — I10 Essential (primary) hypertension: Secondary | ICD-10-CM

## 2022-11-12 DIAGNOSIS — I480 Paroxysmal atrial fibrillation: Secondary | ICD-10-CM

## 2022-11-12 DIAGNOSIS — E785 Hyperlipidemia, unspecified: Secondary | ICD-10-CM | POA: Diagnosis not present

## 2022-11-12 DIAGNOSIS — R0602 Shortness of breath: Secondary | ICD-10-CM

## 2022-11-12 DIAGNOSIS — Z79899 Other long term (current) drug therapy: Secondary | ICD-10-CM

## 2022-11-12 NOTE — Patient Instructions (Signed)
Your blood pressure is excellent  Please continue taking your medications as you have been

## 2022-11-13 LAB — BASIC METABOLIC PANEL
BUN/Creatinine Ratio: 9 — ABNORMAL LOW (ref 10–24)
BUN: 10 mg/dL (ref 8–27)
CO2: 25 mmol/L (ref 20–29)
Calcium: 9.2 mg/dL (ref 8.6–10.2)
Chloride: 109 mmol/L — ABNORMAL HIGH (ref 96–106)
Creatinine, Ser: 1.17 mg/dL (ref 0.76–1.27)
Glucose: 90 mg/dL (ref 70–99)
Potassium: 3.9 mmol/L (ref 3.5–5.2)
Sodium: 149 mmol/L — ABNORMAL HIGH (ref 134–144)
eGFR: 61 mL/min/{1.73_m2} (ref >59)

## 2022-11-16 ENCOUNTER — Ambulatory Visit: Payer: Medicare Other

## 2022-11-16 ENCOUNTER — Ambulatory Visit
Admission: EM | Admit: 2022-11-16 | Discharge: 2022-11-16 | Disposition: A | Discharge status: Home / Self Care | Payer: Medicare Other | Attending: Internal Medicine | Admitting: Internal Medicine

## 2022-11-16 DIAGNOSIS — R053 Chronic cough: Secondary | ICD-10-CM

## 2022-11-16 DIAGNOSIS — R059 Cough, unspecified: Secondary | ICD-10-CM | POA: Diagnosis not present

## 2022-11-16 DIAGNOSIS — Z20822 Contact with and (suspected) exposure to covid-19: Secondary | ICD-10-CM

## 2022-11-16 DIAGNOSIS — J029 Acute pharyngitis, unspecified: Secondary | ICD-10-CM | POA: Diagnosis not present

## 2022-11-16 LAB — POCT RAPID STREP A (OFFICE): Rapid Strep A Screen: NEGATIVE

## 2022-11-16 MED ORDER — DOXYCYCLINE HYCLATE 100 MG PO CAPS
100.0000 mg | ORAL_CAPSULE | Freq: Two times a day (BID) | ORAL | 0 refills | Status: AC
Start: 2022-11-16 — End: 2022-11-23

## 2022-11-16 MED ORDER — GUAIFENESIN 200 MG PO TABS
200.0000 mg | ORAL_TABLET | ORAL | 0 refills | Status: AC | PRN
Start: 2022-11-16 — End: ?

## 2022-11-16 NOTE — ED Triage Notes (Signed)
Patient presents with sore throat x day 3. Breathing difficulty, cough x 1 month and fatigue. Tx with Delsym and cough medicine.

## 2022-11-16 NOTE — Discharge Instructions (Signed)
Strep is negative.  Throat culture and COVID test pending.  I have prescribed an antibiotic and a cough medication to alleviate symptoms.  Please follow-up with primary care and/or pulmonologist if symptoms persist or worsen.

## 2022-11-16 NOTE — ED Provider Notes (Signed)
[facility]    CSN: [id number] Arrival date & time: 11/16/22  1026      History   Chief Complaint Chief Complaint  Patient presents with   Sore Throat    HPI [name] [name] is a 82 y.o. male.   Patient presents with sore throat that started about 3 days ago.  Patient also reports a cough that has been productive and nasal congestion that has been present for about 2 to 3 weeks.  Patient reports that [name] [name] has a cough and shortness of breath at baseline given history of COPD but symptoms have increased over the past few weeks.  Patient has been taking Delsym with minimal improvement.  Denies any known sick contacts or fever.  [name] [name] does use Breztri for COPD as well as using albuterol inhaler as needed.  States [name] [name] has used albuterol inhaler a few times with some improvement in symptoms.   Sore Throat    Past Medical History:  Diagnosis Date   A-fib [facility])    Angina    chest pain- cardiac cath. followed in 2010, record available ,  told then that [name] [name] should f/u /w Dr. Manus Gunning     Arthritis    R knee, back     Asthma    uses  singulair   Cancer [facility])    prostate, melanoma- 2010, excision     CHF (congestive heart failure) (HCC)    Chronic kidney disease    prostate cancer - surg. removal- 1996   Colon polyps    Diverticulitis    Dysrhythmia    palpitations, followed by Dr.Ehinger, seen in prep for surgery on 03/27/2011    GERD (gastroesophageal reflux disease)    Hepatitis    jaundice- many yrs. ago   Hiatal hernia    Hypertension    Melanoma (HCC)    Face   OSA (obstructive sleep apnea) 04/11/2015   Mild to moderate OSA with AHI 13.3/hr overall and AHI 36.8/hr during REM sleep.  Oxygen saturations were as low as 86% during respiratory events.   Pneumonia    hosp. 20 yrs. ago   Prostate cancer (HCC) 1995   Sleep apnea    study done, 10 yrs. 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   Constipation    GERD (gastroesophageal reflux disease) 02/27/2017   Chest pain of uncertain etiology 02/27/2017   CKD (chronic kidney disease), stage III (HCC) 02/27/2017   Anxiety 10/30/2015   SOB (shortness of breath) 08/20/2015   Solitary pulmonary nodule 05/18/2015   OSA (obstructive sleep apnea) 04/11/2015   Chronic anticoagulation 03/17/2015   Asthma, chronic obstructive, with acute exacerbation (HCC) 02/26/2015   PAF (paroxysmal atrial fibrillation) (HCC) 01/26/2015   Actinic keratoses 11/23/2014   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   Osteoarthritis of knee 04/23/2011   Hypertension 04/23/2011   History of prostate cancer 04/23/2011   Melanoma (HCC) 04/23/2011   Hiatal hernia 04/23/2011   Asthma 04/23/2011   Obesity, Class II, BMI 35-39.9, with comorbidity (HCC) 04/23/2011    Past Surgical History:  Procedure Laterality Date   CARDIAC CATHETERIZATION     2010   CARDIAC CATHETERIZATION N/A 10/22/2015   Procedure: Left Heart Cath and Coronary Angiography;  Surgeon: Yvonne Kendall, MD;  Location: [facility] INVASIVE CV LAB;  Service: Cardiovascular;  Laterality: N/A;   CARDIAC CATHETERIZATION     CHOLECYSTECTOMY N/A 07/22/2021   Procedure: LAPAROSCOPIC CHOLECYSTECTOMY;  Surgeon: Emelia Loron, MD;  Location: [facility] OR;  Service: General;  Laterality: N/A;   FRACTURE SURGERY     L wrist, hardware- 1982   JOINT REPLACEMENT     L knee, 2009   LEFT HEART CATHETERIZATION WITH CORONARY ANGIOGRAM N/A 09/08/2011   Procedure: LEFT HEART CATHETERIZATION WITH CORONARY ANGIOGRAM;  Surgeon: Lesleigh Noe, MD;  Location: [facility] CATH LAB;  Service: Cardiovascular;  Laterality: N/A;   LUNG CANCER SURGERY     PROSTATECTOMY     for ca   TONSILLECTOMY     as child   TOTAL KNEE ARTHROPLASTY  04/22/2011   Procedure: TOTAL  KNEE ARTHROPLASTY;  Surgeon: Valeria Batman, MD;  Location: [facility] OR;  Service: Orthopedics;  Laterality: Right;       Home Medications    Prior to Admission medications   Medication Sig Start Date End Date Taking? Authorizing Provider  doxycycline (VIBRAMYCIN) 100 MG capsule Take 1 capsule (100 mg total) by mouth 2 (two) times daily for 7 days. 11/16/22 11/23/22 Yes Katrice Goel, Acie Fredrickson, FNP  guaiFENesin 200 MG tablet Take 1 tablet (200 mg total) by mouth every 4 (four) hours as needed for cough or to loosen phlegm. 11/16/22  Yes Danyle Boening, Rolly Salter E, FNP  acetaminophen (TYLENOL) 500 MG tablet Take 2 tablets (1,000 mg total) by mouth every 6 (six) hours as needed. 07/24/21   Barnetta Chapel, PA-C  albuterol (PROVENTIL) (2.5 MG/3ML) 0.083% nebulizer solution Take 2.5 mg by nebulization every 4 (four) hours as needed for shortness of breath. 06/17/21   [provider]  albuterol (VENTOLIN HFA) 108 (90 Base) MCG/ACT inhaler Inhale 1-2 puffs into the lungs every 6 (six) hours as needed for wheezing or shortness of breath. 02/19/20   Wieters, Hallie C, PA-C  ammonium lactate (LAC-HYDRIN) 12 % lotion daily at 6 (six) AM. 03/24/22   [provider]  Ascorbic Acid (VITAMIN C) 1000 MG tablet Take 1,000 mg by mouth daily.    [provider]  azithromycin (ZITHROMAX) 250 MG tablet Take 250 mg by mouth 3 (three) times a week.    [provider]  benzonatate (TESSALON) 100 MG capsule Take 200 mg by mouth daily as needed for cough. 11/30/20   [provider]  Budeson-Glycopyrrol-Formoterol (BREZTRI AEROSPHERE) 160-9-4.8 MCG/ACT AERO Inhale 2 puffs into the lungs in the morning and at bedtime.    [provider]  Calcium Carbonate-Vitamin D 600-200 MG-UNIT TABS Take 1 tablet by mouth 2 (two) times daily.    [provider]  ELIQUIS 5 MG TABS tablet TAKE 1 TABLET BY MOUTH TWICE  DAILY 10/13/22   Swinyer, Zachary George, NP  empagliflozin (JARDIANCE) 10 MG TABS tablet Take 1  tablet (10 mg total) by mouth daily. 10/28/22   Swinyer, Zachary George, NP  flecainide (TAMBOCOR) 50 MG tablet Take 1 tablet (50 mg total) by mouth 2 (two) times daily. 10/24/22   Chandrasekhar, Rondel Jumbo, MD  fluticasone (FLONASE) 50 MCG/ACT nasal spray Place 1 spray into both nostrils daily as needed for allergies or rhinitis.    [provider]  furosemide (LASIX) 20 MG tablet Take one (1) tablet by mouth ( 20 mg) daily. 01/23/22 01/24/23  Swinyer, Zachary George, NP  montelukast (SINGULAIR) 10 MG tablet Take 10 mg by mouth daily. 09/25/20  [provider]  Multiple Vitamin (MULTIVITAMIN WITH MINERALS) TABS tablet Take 1 tablet by mouth daily.    [provider]  niacinamide 500 MG tablet Take 500 mg by mouth 2 (two) times daily with a meal.    [provider]  Omega-3 Fatty Acids (FISH OIL) 1200 MG CAPS Take 1,200 mg by mouth 2 (two) times daily.    [provider]  omeprazole (PRILOSEC) 40 MG capsule Take 40 mg by mouth at bedtime.     [provider]  polyvinyl alcohol (LIQUIFILM TEARS) 1.4 % ophthalmic solution Place 1 drop into both eyes as needed for dry eyes. 08/17/20   Berton Mount I, MD  rosuvastatin (CRESTOR) 20 MG tablet TAKE 1 TABLET BY MOUTH DAILY 11/06/22   Chandrasekhar, Mahesh A, MD  sacubitril-valsartan (ENTRESTO) 97-103 MG Take 1 tablet by mouth 2 (two) times daily. 10/31/22   Christell Constant, MD    Family History Family History  Problem Relation Age of Onset   Crohn's disease Brother    Breast cancer Mother    Alzheimer's disease Father    Heart disease Brother    Hypertension Brother    Anesthesia problems Neg Hx    Hypotension Neg Hx    Malignant hyperthermia Neg Hx    Pseudochol deficiency Neg Hx     Social History Social History   Tobacco Use   Smoking status: Former    Current packs/day: 0.00    Average packs/day: 1.5 packs/day for 30.0 years (45.0 ttl pk-yrs)    Types: Cigarettes    Start date:  04/10/1953    Quit date: 04/11/1983    Years since quitting: 39.6   Smokeless tobacco: Never  Vaping Use   Vaping status: Never Used  Substance Use Topics   Alcohol use: No   Drug use: No     Allergies   Carvedilol, Tiotropium, Zolpidem, Morphine, Spiriva respimat [tiotropium bromide monohydrate], Aminophylline, Atenolol, and Lisinopril   Review of Systems Review of Systems Per HPI  Physical Exam Triage Vital Signs ED Triage Vitals  Encounter Vitals Group     BP 11/16/22 1110 (!) 125/59     Systolic BP Percentile --      Diastolic BP Percentile --      Pulse Rate 11/16/22 1110 71     Resp --      Temp 11/16/22 1110 97.8 F (36.6 C)     Temp Source 11/16/22 1110 Oral     SpO2 11/16/22 1110 95 %     Weight 11/16/22 1111 258 lb (117 kg)     Height 11/16/22 1111 5\' 7"  (1.702 m)     Head Circumference --      Peak Flow --      Pain Score 11/16/22 1111 7     Pain Loc --      Pain Education --      Exclude from Growth Chart --    No data found.  Updated Vital Signs BP (!) 125/59 (BP Location: Right Leg)   Pulse 71   Temp 97.8 F (36.6 C) (Oral)   Ht 5\' 7"  (1.702 m)   Wt 258 lb (117 kg)   SpO2 95%   BMI 40.41 kg/m   Visual Acuity Right Eye Distance:   Left Eye Distance:   Bilateral Distance:    Right Eye Near:   Left Eye Near:    Bilateral Near:     Physical Exam Constitutional:      General: [name] [name] is  not in acute distress.    Appearance: Normal appearance. [name] [name] is not toxic-appearing or diaphoretic.  HENT:     Head: Normocephalic and atraumatic.     Right Ear: Tympanic membrane and ear canal normal.     Left Ear: Tympanic membrane and ear canal normal.     Nose: Congestion present.     Mouth/Throat:     Mouth: Mucous membranes are moist.     Pharynx: Posterior oropharyngeal erythema present.  Eyes:     Extraocular Movements: Extraocular movements intact.     Conjunctiva/sclera: Conjunctivae normal.     Pupils: Pupils are equal, round, and reactive to  light.  Cardiovascular:     Rate and Rhythm: Normal rate and regular rhythm.     Pulses: Normal pulses.     Heart sounds: Normal heart sounds.  Pulmonary:     Effort: Pulmonary effort is normal. No respiratory distress.     Breath sounds: Normal breath sounds. No stridor. No wheezing, rhonchi or rales.  Abdominal:     General: Abdomen is flat. Bowel sounds are normal.     Palpations: Abdomen is soft.  Musculoskeletal:        General: Normal range of motion.     Cervical back: Normal range of motion.  Skin:    General: Skin is warm and dry.  Neurological:     General: No focal deficit present.     Mental Status: [name] [name] is alert and oriented to person, place, and time. Mental status is at baseline.  Psychiatric:        Mood and Affect: Mood normal.        Behavior: Behavior normal.      UC Treatments / Results  Labs (all labs ordered are listed, but only abnormal results are displayed) Labs Reviewed  CULTURE, GROUP A STREP ([facility])  SARS CORONAVIRUS 2 (TAT 6-24 HRS)  POCT RAPID STREP A (OFFICE)    EKG   Radiology DG Chest 2 View  Result Date: 11/16/2022 CLINICAL DATA:  Cough for 1 month. EXAM: CHEST - 2 VIEW COMPARISON:  April 01, 2021. FINDINGS: The heart size and mediastinal contours are within normal limits. Both lungs are clear. Old left rib fractures are noted. IMPRESSION: No active cardiopulmonary disease. Electronically Signed   By: Lupita Raider M.D.   On: 11/16/2022 12:52    Procedures Procedures (including critical care time)  Medications Ordered in UC Medications - No data to display  Initial Impression / Assessment and Plan / UC Course  I have reviewed the triage vital signs and the nursing notes.  Pertinent labs & imaging results that were available during my care of the patient were reviewed by me and considered in my medical decision making (see chart for details).     Chest x-ray completed given persistent coughing which was negative for any acute  cardiopulmonary process.  Suspect possible mild COPD exacerbation so will treat with doxycycline.  Oxygen is normal and patient is not tachypneic so do not think that emergent evaluation is necessary.  Guaifenesin prescribed to take as needed for cough as patient declines taking benzonatate.  Patient does have a new onset sore throat so suspect this could be new viral illness on top of persistent cough.  Rapid strep was negative.  Throat culture and COVID test pending.  Advised supportive care and symptom management related to this.  Also discussed possibility that cough and sore throat could be related to GERD/reflux.  Although, have a low suspicion for  this given throat appears erythematous on exam.  Advised strict follow-up with PCP or pulmonology if symptoms persist or worsen.  Also gave strict ER precautions.  Patient does take azithromycin 3 times weekly so advised to take doxycycline with food to avoid stomach upset.  Patient and wife verbalized understanding and were agreeable with plan. Final Clinical Impressions(s) / UC Diagnoses   Final diagnoses:  Sore throat  Persistent cough for 3 weeks or longer  Encounter for laboratory testing for COVID-19 virus     Discharge Instructions      Strep is negative.  Throat culture and COVID test pending.  I have prescribed an antibiotic and a cough medication to alleviate symptoms.  Please follow-up with primary care and/or pulmonologist if symptoms persist or worsen.     ED Prescriptions     Medication Sig Dispense Auth. Provider   doxycycline (VIBRAMYCIN) 100 MG capsule Take 1 capsule (100 mg total) by mouth 2 (two) times daily for 7 days. 14 capsule [place], [place] E, [place]   guaiFENesin 200 MG tablet Take 1 tablet (200 mg total) by mouth every 4 (four) hours as needed for cough or to loosen phlegm. 30 tablet [place], Acie Fredrickson, [place]      PDMP not reviewed this encounter.   Gustavus Bryant, [place] 11/16/22 1335

## 2022-11-17 DIAGNOSIS — J029 Acute pharyngitis, unspecified: Secondary | ICD-10-CM | POA: Diagnosis not present

## 2022-11-17 DIAGNOSIS — K122 Cellulitis and abscess of mouth: Secondary | ICD-10-CM | POA: Diagnosis not present

## 2022-11-17 DIAGNOSIS — J441 Chronic obstructive pulmonary disease with (acute) exacerbation: Secondary | ICD-10-CM | POA: Diagnosis not present

## 2022-11-17 LAB — SARS CORONAVIRUS 2 (TAT 6-24 HRS): SARS Coronavirus 2: NEGATIVE

## 2022-11-19 LAB — CULTURE, GROUP A STREP (THRC)

## 2022-12-01 ENCOUNTER — Other Ambulatory Visit: Payer: Medicare Other

## 2022-12-15 ENCOUNTER — Ambulatory Visit: Payer: Medicare Other | Attending: Internal Medicine

## 2022-12-15 DIAGNOSIS — B370 Candidal stomatitis: Secondary | ICD-10-CM | POA: Diagnosis not present

## 2022-12-15 DIAGNOSIS — Z79899 Other long term (current) drug therapy: Secondary | ICD-10-CM

## 2022-12-15 DIAGNOSIS — Z23 Encounter for immunization: Secondary | ICD-10-CM | POA: Diagnosis not present

## 2022-12-15 DIAGNOSIS — J449 Chronic obstructive pulmonary disease, unspecified: Secondary | ICD-10-CM | POA: Diagnosis not present

## 2022-12-15 DIAGNOSIS — E785 Hyperlipidemia, unspecified: Secondary | ICD-10-CM | POA: Diagnosis not present

## 2022-12-15 DIAGNOSIS — I5022 Chronic systolic (congestive) heart failure: Secondary | ICD-10-CM | POA: Diagnosis not present

## 2022-12-15 DIAGNOSIS — R0609 Other forms of dyspnea: Secondary | ICD-10-CM | POA: Diagnosis not present

## 2022-12-16 ENCOUNTER — Other Ambulatory Visit: Payer: Self-pay | Admitting: Nurse Practitioner

## 2022-12-16 LAB — LIPID PANEL
Chol/HDL Ratio: 3.9 {ratio} (ref 0.0–5.0)
Cholesterol, Total: 167 mg/dL (ref 100–199)
HDL: 43 mg/dL (ref >39)
LDL Chol Calc (NIH): 102 mg/dL — ABNORMAL HIGH (ref 0–99)
Triglycerides: 125 mg/dL (ref 0–149)
VLDL Cholesterol Cal: 22 mg/dL (ref 5–40)

## 2022-12-16 LAB — ALT: ALT: 8 [IU]/L (ref 0–44)

## 2022-12-22 ENCOUNTER — Encounter: Payer: Self-pay | Admitting: Internal Medicine

## 2022-12-24 ENCOUNTER — Telehealth: Payer: Self-pay

## 2022-12-24 DIAGNOSIS — E785 Hyperlipidemia, unspecified: Secondary | ICD-10-CM

## 2022-12-24 DIAGNOSIS — I2510 Atherosclerotic heart disease of native coronary artery without angina pectoris: Secondary | ICD-10-CM

## 2022-12-24 NOTE — Telephone Encounter (Signed)
The patient has been notified of the result and verbalized understanding.  All questions (if any) were answered. [name] Right Lazar Tierce, RN 12/24/2022 12:14 PM     Pt reports restarted rosuvastatin 20 mg about a week ago.  Previously stopped d/t reported cough that did not improve not on med.  Will have wife send a my chart message with date for f/u fasting labs. Orders for FLP and ALT placed.

## 2022-12-24 NOTE — Telephone Encounter (Signed)
-----   Message from Christell Constant sent at 12/21/2022  3:24 PM EDT ----- Results: Cholesterol is much worse Plan: Clarify current medication and myalgia symptoms, rosuvastatin 20 mg was working great and needs to be reconsidered  Christell Constant, MD

## 2023-01-01 DIAGNOSIS — L570 Actinic keratosis: Secondary | ICD-10-CM | POA: Diagnosis not present

## 2023-01-01 DIAGNOSIS — L820 Inflamed seborrheic keratosis: Secondary | ICD-10-CM | POA: Diagnosis not present

## 2023-01-05 DIAGNOSIS — R590 Localized enlarged lymph nodes: Secondary | ICD-10-CM | POA: Diagnosis not present

## 2023-01-05 DIAGNOSIS — C4A9 Merkel cell carcinoma, unspecified: Secondary | ICD-10-CM | POA: Diagnosis not present

## 2023-01-05 DIAGNOSIS — I700 Atherosclerosis of aorta: Secondary | ICD-10-CM | POA: Diagnosis not present

## 2023-01-05 DIAGNOSIS — C3411 Malignant neoplasm of upper lobe, right bronchus or lung: Secondary | ICD-10-CM | POA: Diagnosis not present

## 2023-01-05 DIAGNOSIS — I2510 Atherosclerotic heart disease of native coronary artery without angina pectoris: Secondary | ICD-10-CM | POA: Diagnosis not present

## 2023-01-05 DIAGNOSIS — J439 Emphysema, unspecified: Secondary | ICD-10-CM | POA: Diagnosis not present

## 2023-01-05 DIAGNOSIS — R918 Other nonspecific abnormal finding of lung field: Secondary | ICD-10-CM | POA: Diagnosis not present

## 2023-01-06 ENCOUNTER — Ambulatory Visit: Payer: Medicare Other

## 2023-01-06 IMAGING — CT CT ANGIO CHEST-ABD-PELV FOR DISSECTION W/ AND WO/W CM
2 of 7 series · 13 of 46 positions shown, 15 images · non-contrast
Comparison: CT chest from 05/21/2021 and PET-CT from 02/25/2021.

CLINICAL DATA: Acute aortic syndrome suspected.

EXAM:
CT ANGIOGRAPHY CHEST, ABDOMEN AND PELVIS
TECHNIQUE: Non-contrast CT of the chest was initially obtained.

[Series 6: arterial · axial · arterial · 0.96mm/px · z∈[+910,+1504]mm · 10 of 347 slices shown, 12 images]
[im 25/347  soft-tissue]
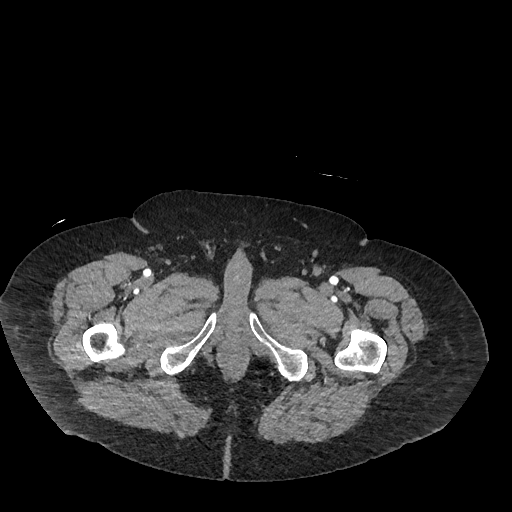
[im 25/347  bone]
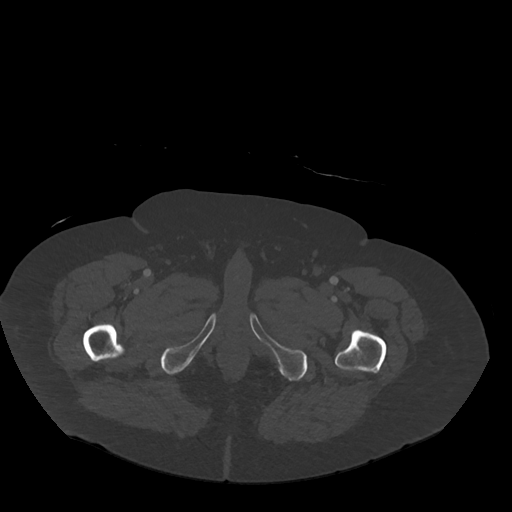
[im 50/347  soft-tissue]
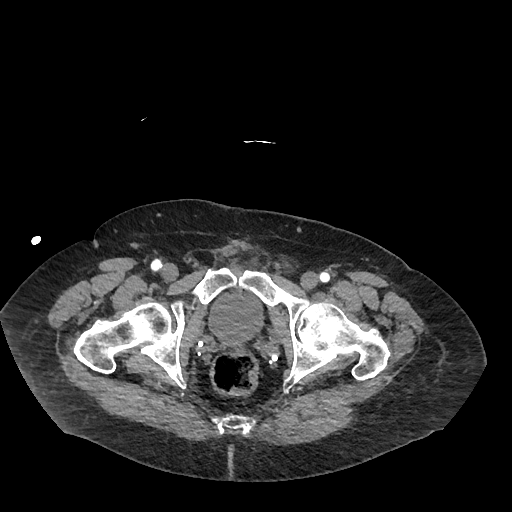
[im 99/347  soft-tissue]
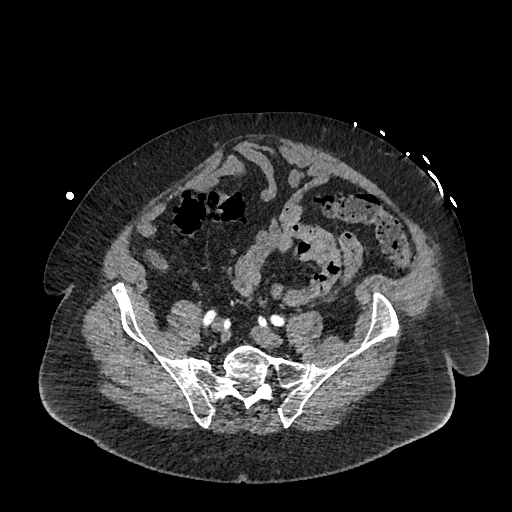
[im 124/347  soft-tissue]
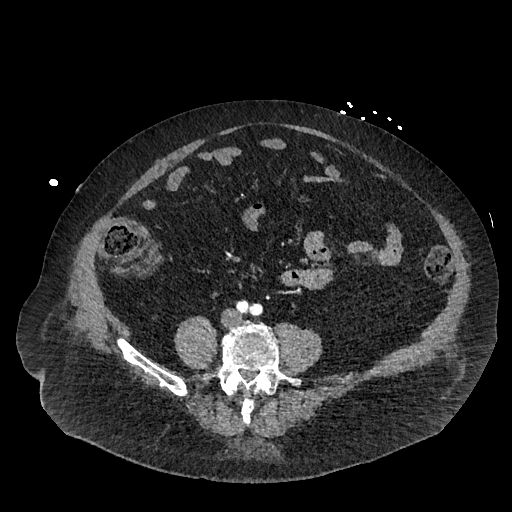
[im 149/347  soft-tissue]
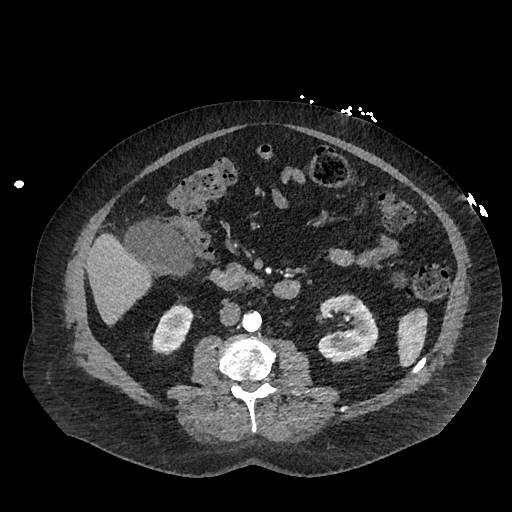
[im 198/347  soft-tissue]
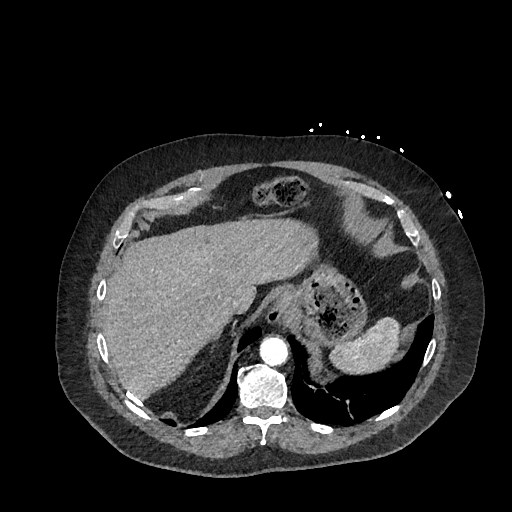
[im 223/347  soft-tissue]
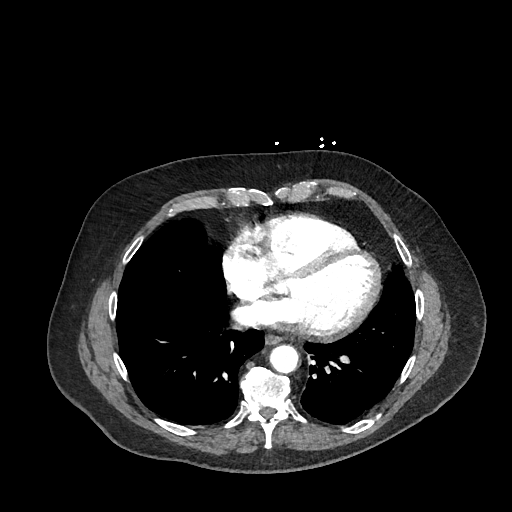
[im 248/347  soft-tissue]
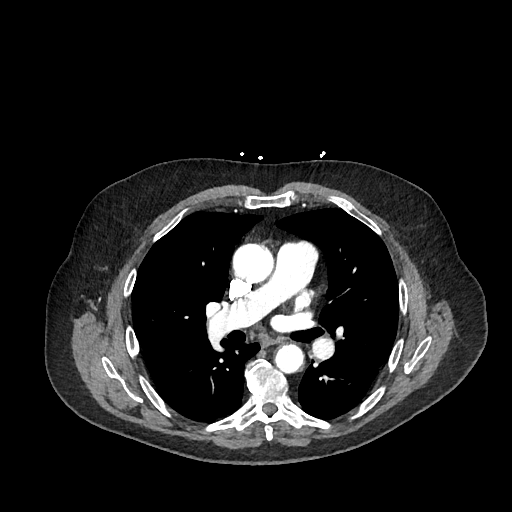
[im 297/347  soft-tissue]
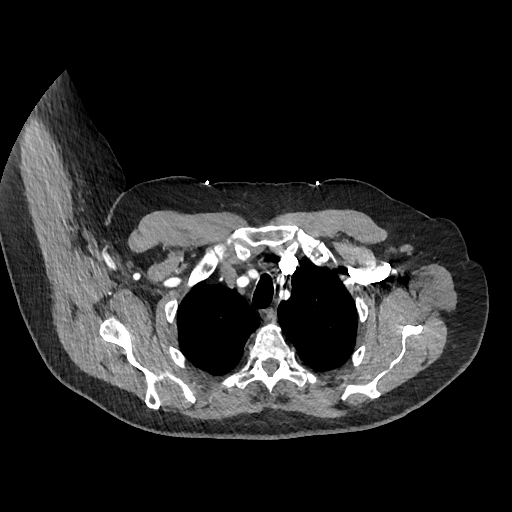
[im 297/347  bone]
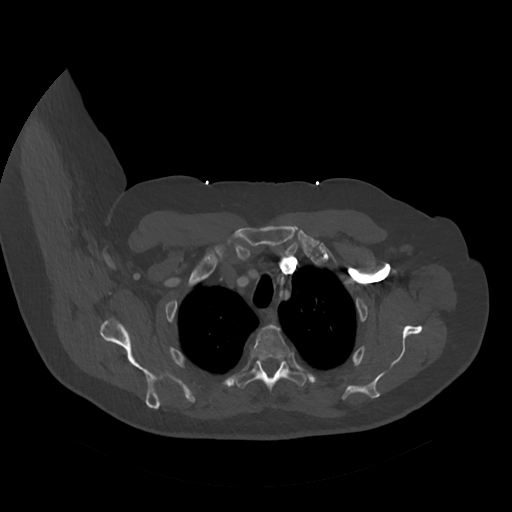
[im 322/347  soft-tissue]
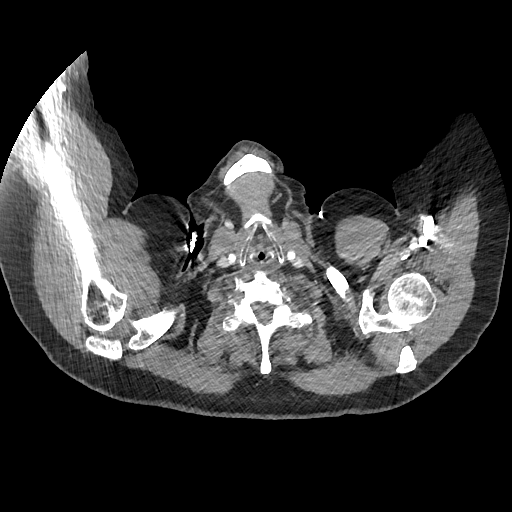

[Series 9: cor · coronal · 1.02mm/px · 3 of 151 slices shown]
[im 38/151  soft-tissue]
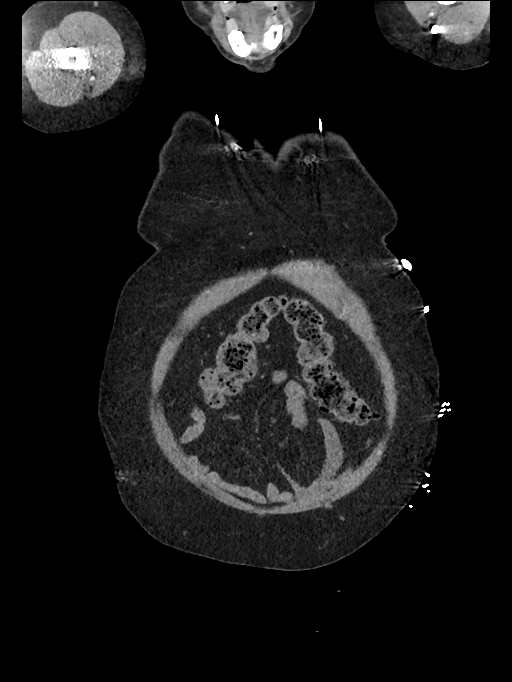
[im 76/151  soft-tissue]
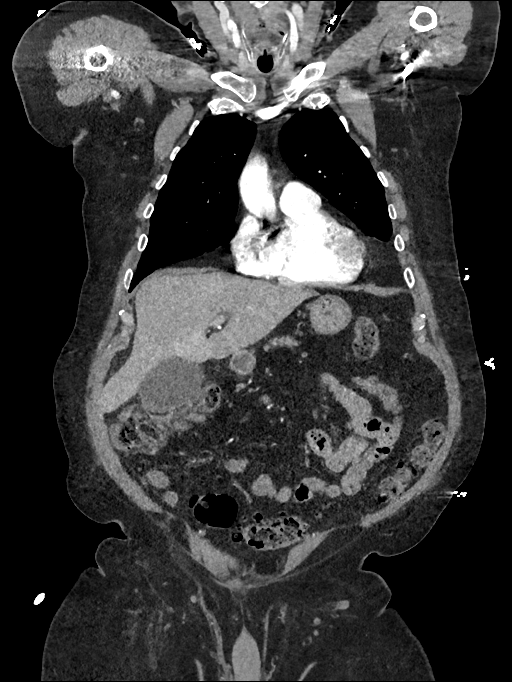
[im 113/151  soft-tissue]
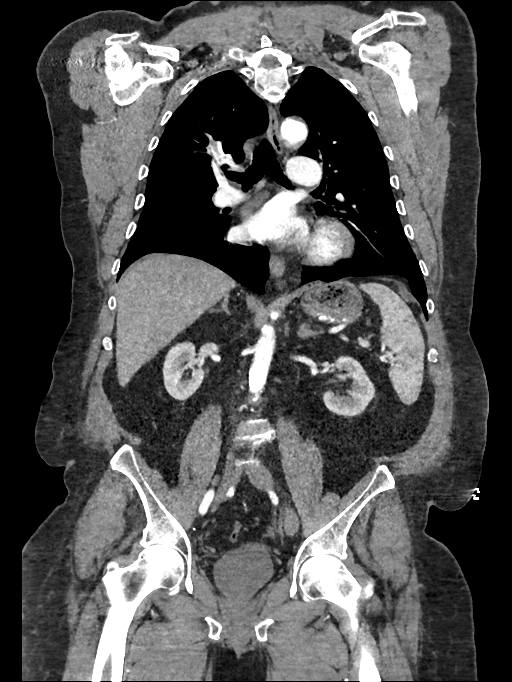

[13 of 46 positions shown; findings below may reference images not displayed]

Multidetector CT imaging through the chest, abdomen and pelvis was
performed using the standard protocol during bolus administration of
intravenous contrast. Multiplanar reconstructed images and MIPs were
obtained and reviewed to evaluate the vascular anatomy.

RADIATION DOSE REDUCTION: This exam was performed according to the
departmental dose-optimization program which includes automated
exposure control, adjustment of the mA and/or kV according to
patient size and/or use of iterative reconstruction technique.

CONTRAST:  100mL OMNIPAQUE IOHEXOL 350 MG/ML SOLN
FINDINGS: CTA CHEST FINDINGS

Cardiovascular: Preferential opacification of the thoracic aorta. No
evidence of thoracic aortic aneurysm or dissection. Aortic
atherosclerosis and coronary artery calcifications. Mild cardiac
enlargement. No pericardial effusion. The main pulmonary artery
appears patent. No signs of a central obstructing pulmonary embolus.

Mediastinum/Nodes: No enlarged supraclavicular, axillary,
mediastinal or hilar lymph nodes. The thyroid gland, trachea and
esophagus demonstrate no significant findings.

Lungs/Pleura: There is no pleural effusion, airspace consolidation,
or pneumothorax. Bandlike area of architectural distortion and
fibrosis within the right upper lobe is again noted compatible with
changes secondary to external beam radiation. Central nodular
component within this area is again noted measuring 1 point 3 x
cm is stable from the previous exam. Imaging findings are compatible
with changes secondary to external beam radiation with underlying
treated tumor. Stable scarring within the left lower lobe.

Musculoskeletal: Multiple remote healed left rib fractures are again
seen in appears similar to previous exam. No acute or suspicious
osseous findings identified.

Review of the MIP images confirms the above findings.

CTA ABDOMEN AND PELVIS FINDINGS

VASCULAR

Aorta: Normal caliber aorta without aneurysm, dissection, vasculitis
or significant stenosis. Aortic atherosclerotic calcifications.

Celiac: Patent without evidence of aneurysm, dissection, vasculitis
or significant stenosis.

SMA: Patent without evidence of aneurysm, dissection, vasculitis or
significant stenosis.

Renals: Both renal arteries are patent without evidence of aneurysm,
dissection, vasculitis, fibromuscular dysplasia or significant
stenosis.

IMA: Patent without evidence of aneurysm, dissection, vasculitis or
significant stenosis.

Inflow: Patent without evidence of aneurysm, dissection, vasculitis
or significant stenosis.

Veins: No obvious venous abnormality within the limitations of this
arterial phase study.

Review of the MIP images confirms the above findings.

NON-VASCULAR

Hepatobiliary: No suspicious liver lesions. Several scattered
subcentimeter low-density liver foci are technically too small to
characterize small stones are identified within the dependent
portion of the gallbladder. There is mild hazy soft tissue stranding
around the gallbladder. No signs of bile duct dilatation.

Pancreas: Unremarkable. No pancreatic ductal dilatation or
surrounding inflammatory changes.

Spleen: Normal in size without focal abnormality.

Adrenals/Urinary Tract: Bilateral Bosniak class 1 and 2 kidney cysts
are identified. The largest arises off the upper pole of the right
kidney measuring 3.1 cm. No follow-up recommended. No signs of
hydronephrosis bilaterally. Urinary bladder is unremarkable.

Stomach/Bowel: Small hiatal hernia is identified. The appendix is
visualized and appears normal. No small bowel wall thickening,
inflammation or distension. There is a ventral abdominal wall hernia
which contains a nonobstructed loop of small bowel, image 256/6.
There is mild wall thickening involving the ascending colon adjacent
to the gallbladder, image 70/9. Extensive sigmoid diverticulosis
without signs of acute diverticulitis.

Lymphatic: No signs of abdominopelvic adenopathy.

Reproductive: Prostate gland is not visualized compatible with prior
prostatectomy.

Other: No significant free fluid or fluid collections identified. No
signs of pneumoperitoneum.

Musculoskeletal: No acute or significant osseous findings.

Review of the MIP images confirms the above findings.
IMPRESSION: 1. No evidence for aortic dissection or aneurysm.
2. Tiny layering gallstones. Mild hazy soft tissue stranding around
the gallbladder. Cannot exclude gallbladder wall inflammation due to
cholecystitis. Consider further evaluation with right upper quadrant
sonogram to assess for underlying gallbladder inflammation.
3. Short segment of mild wall thickening involving the ascending
colon where it abuts the gallbladder. If there are signs of
gallbladder inflammation on right upper quadrant sonogram thin this
may represent secondary inflammation of the colon. Alternatively
correlate for any clinical signs or symptoms of colitis.
4. Ventral abdominal wall hernia containing a nonobstructed loop of
small bowel.
5. Extensive sigmoid diverticulosis without signs of acute
diverticulitis.
6. Stable post radiation change within the right upper lobe with
stable treated Lung nodule.
7. Prostatectomy.
8. Small hiatal hernia.
9. Aortic Atherosclerosis ([id number]-UI0.0).

## 2023-01-06 IMAGING — US US ABDOMEN LIMITED
1 series · 13 of 25 positions shown · non-contrast
Comparison: 10/02/2016

CLINICAL DATA: Pain right upper quadrant

EXAM:
ULTRASOUND ABDOMEN LIMITED RIGHT UPPER QUADRANT

[Series 1: us abdomen limited ruq (liver/gb) · 13 of 56 slices shown]
[im 1/56]
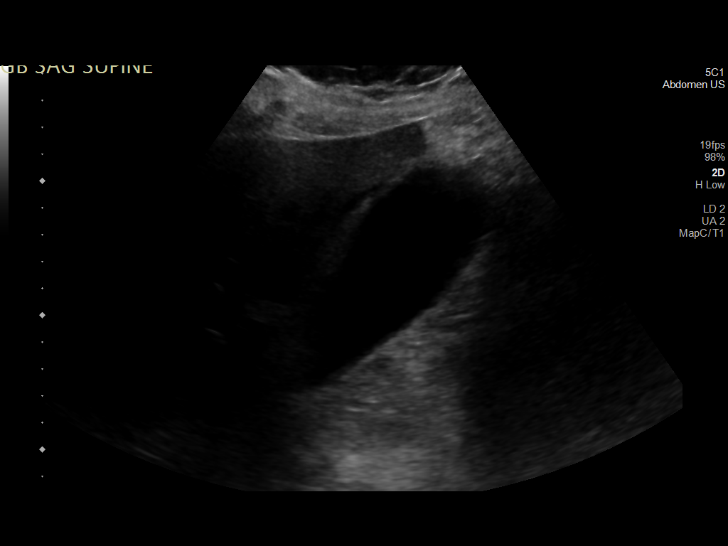
[im 5/56]
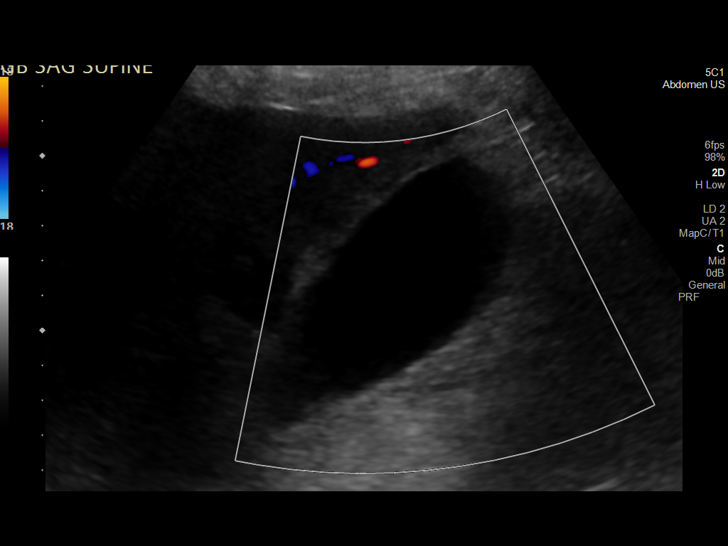
[im 10/56]
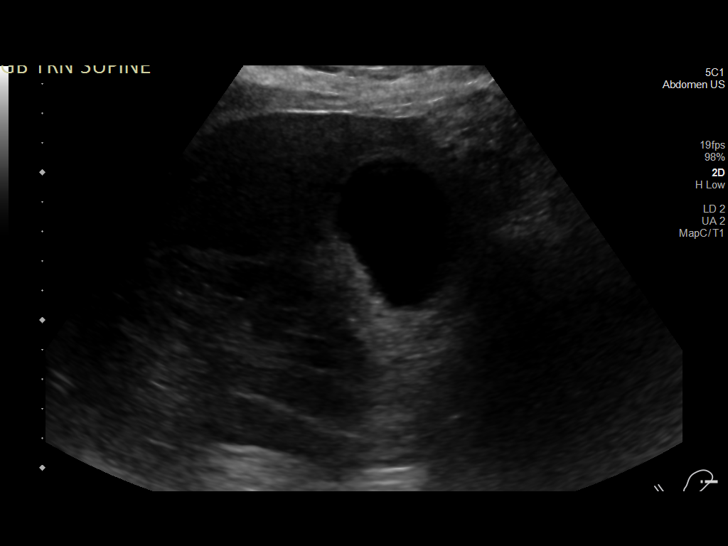
[im 14/56]
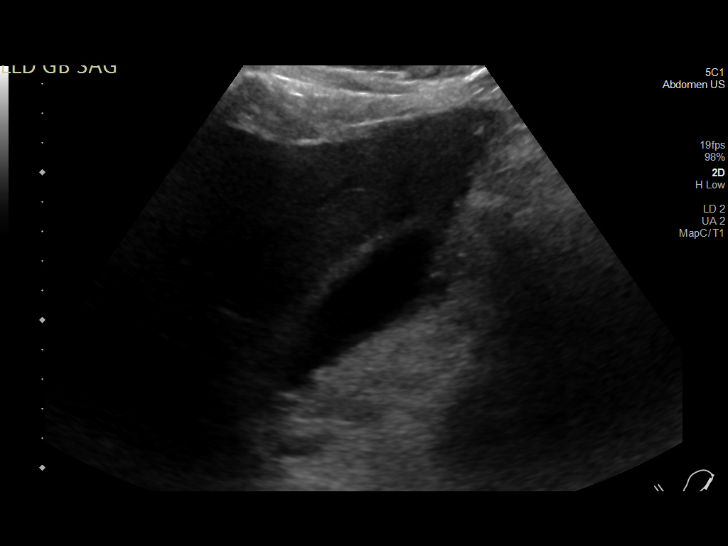
[im 19/56]
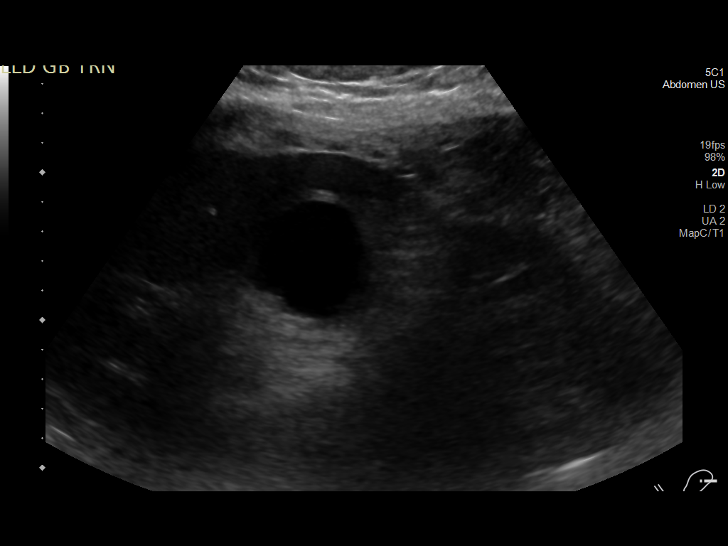
[im 23/56]
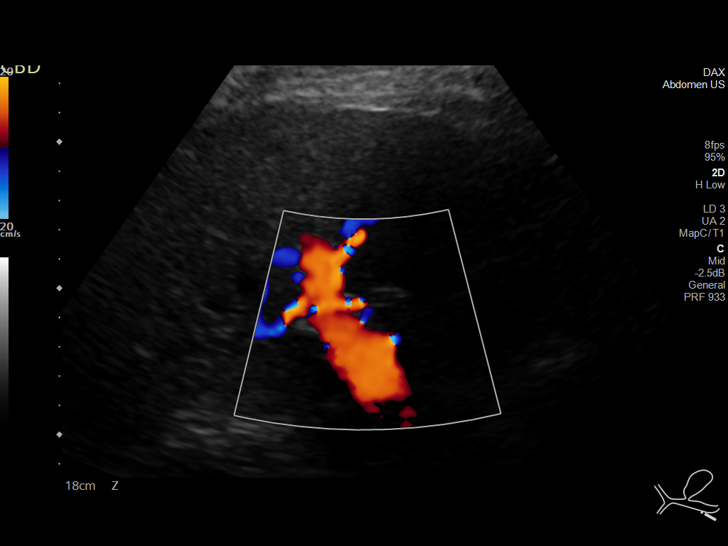
[im 28/56]
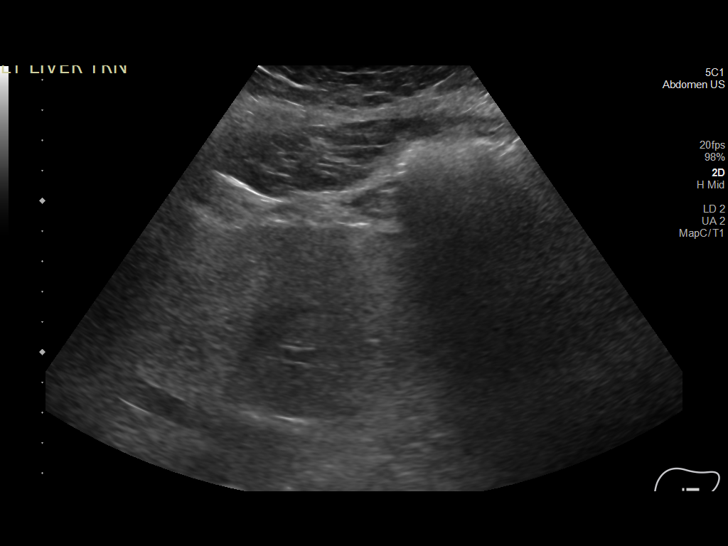
[im 33/56]
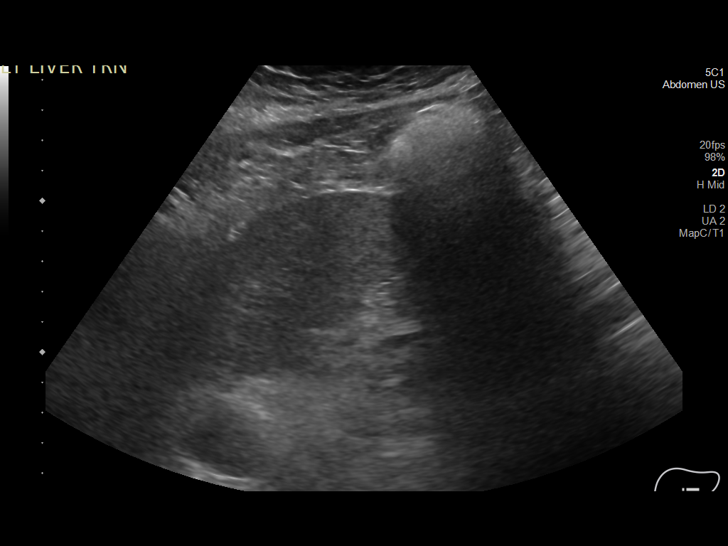
[im 37/56]
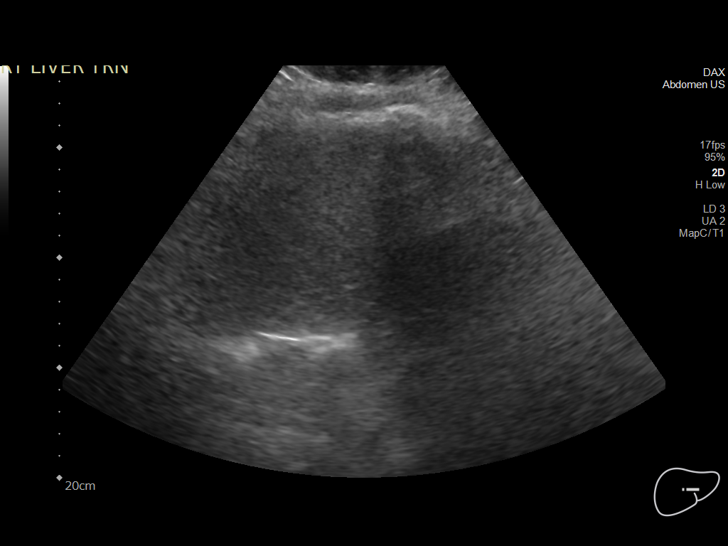
[im 42/56]
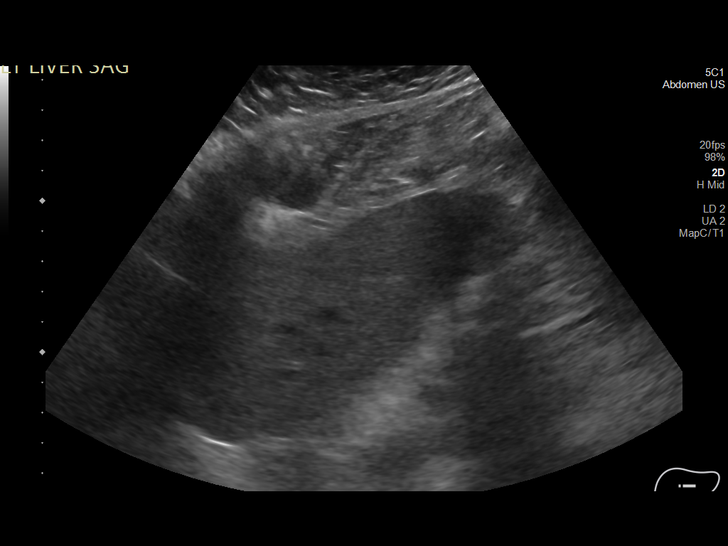
[im 46/56]
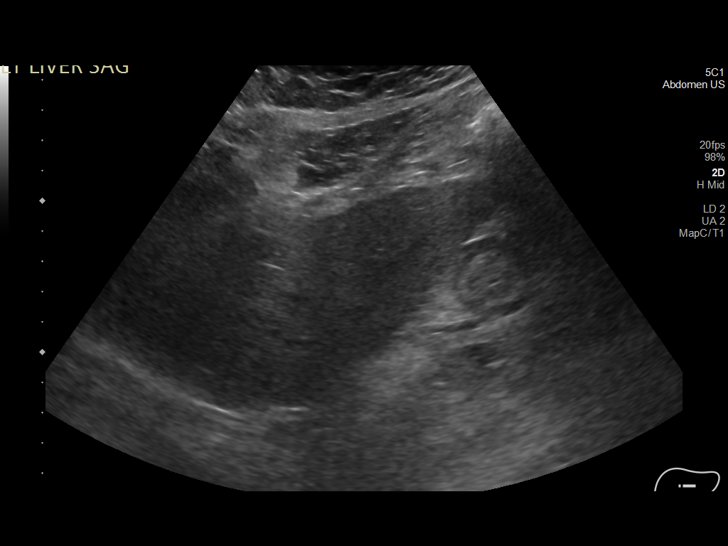
[im 51/56]
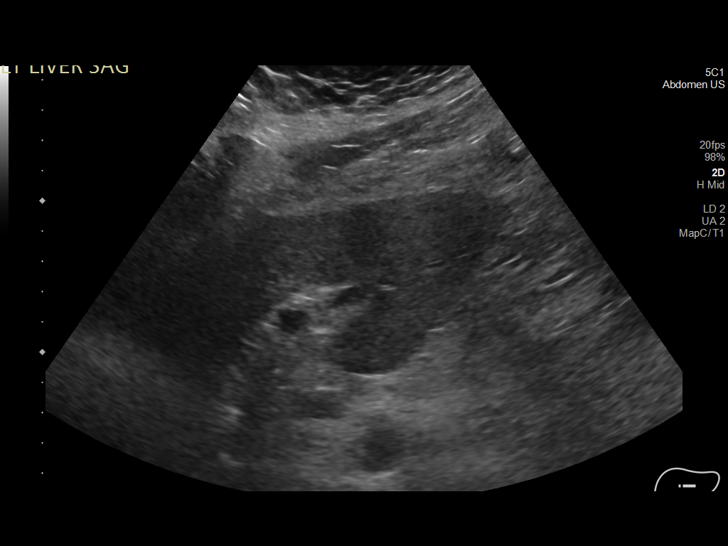
[im 56/56]
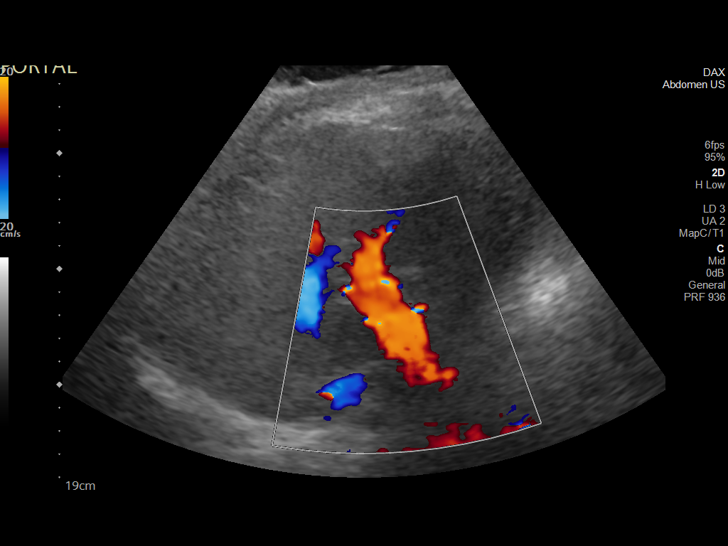

[13 of 25 positions shown; findings below may reference images not displayed]

FINDINGS: Gallbladder:

Sludge is seen in the dependent portion of gallbladder lumen. There
is no definite demonstrable gallbladder stone with acoustic
shadowing. Possibility of tiny gallbladder stones is not excluded.
Technologist observed tenderness over the gallbladder during the
study. There is possible tiny amount pericholecystic fluid seen in
the 1 of the images. There is no significant wall thickening in
gallbladder.

Common bile duct:

Diameter: 4 mm.

Liver:

There is 1.9 cm hyperechoic focus in the left lobe of liver
suggesting possible hemangioma. Similar finding was seen in the
study done on 10/02/2016. Portal vein is patent on color Doppler
imaging with normal direction of blood flow towards the liver.

Other: None.
IMPRESSION: There is layering of sludge and possible tiny gallbladder stones in
the dependent portion of gallbladder lumen. There is trace amount of
fluid adjacent to the gallbladder. Technologist observed focal
tenderness over the gallbladder during the study. Please correlate
with clinical symptoms and laboratory findings to evaluate for acute
cholecystitis. There is no dilation of bile ducts.

1.9 cm hyperechoic focus in the left lobe of liver suggests possible
hemangioma.

## 2023-01-07 ENCOUNTER — Ambulatory Visit: Payer: Medicare Other

## 2023-01-08 DIAGNOSIS — R911 Solitary pulmonary nodule: Secondary | ICD-10-CM | POA: Diagnosis not present

## 2023-01-11 NOTE — Progress Notes (Unsigned)
Patient ID: [name] [name]                 DOB: 1940/12/14                      MRN: [id number]     HPI: [name] [name] is a 82 y.o. male patient referred to pharmacy clinic by Dr. Izora [name]. PMH is significant for PAF, HFpEF, nonobstructive CAD per cath in 2017, OSA, HTN, CKD, and Merkel cell carcinoma. At a visit with Dr. Izora [name] on 07/25/22, pt reported new chest pain and increased exertional fatigue so coronary CT was ordered which showed a score of 882 (60th percentile for age and sex) and echo which showed LVEF 60-65%, G1DD, LVH. He saw [name] [name] on 10/28/22 for HFpEF and SOB and was started on Jardiance at the time.   Pt seen on 8/23 for lipid management. He ultimately resumed his rosuvastatin 20mg  daily which he previously tolerated well and which controlled his cholesterol. He reported improvement in chest congestion and LE edema since starting Jardiance. He sleeps in a recliner every night as he cannot lay flat due to SOB and had been sleeping better since starting on Jardiance. His copay was high due to being in the donut hole. His BP was elevated at 150/72 and I changed his telmisartan to [name] [name], and enrolled him in the [employer] to make both his [name] [name] and [name] [name] free.   Pt presented 9/4 for follow up. Denied dizziness (unless he's coughing) or headache. LE edema and SOB are unchanged from last visit. Brought in list of home BP readings that showed much improved control on his regimen. No changes were made to his medications at this visit. Labs were obtained immediately after the appointment showing stable renal function with stable serum Cr and normal serum potassium level.  Tolerating his medications well. Cough with Entresto (on allergy list) Switched back to telmisartan 80 mg daily Consider adding spiro 12.5 dy (HFpEF, CKD) Others: CCB, hydrochlorothiazide (not ideal with CKD)  Resumed rosuvastatin? (thought was cause of cough  initially) Denies dizziness (unless he's coughing) or headache.  LE edema and SOB.  Takes his Lasix daily except on Sundays (doesn't like taking on church days). His wife manages his medications.  Brings in a list of home BP readings:    Current HF medications: Jardiance 10 mg daily, telmisartan 80 mg daily, furosemide 20 mg daily except Sundays BP Goal: <130/80  Diet: eat out sometimes and will save leftovers 2 meals/day Breakfast: eggs, bacon Lunch: sandwiches with lunch meat Dinner: meat, veggies from son's garden, chicken, beef, hamburger   Exercise: none   Family History: Mother- breast cancer; father- Alzheimer's; brother- Chron's disease; brother- heart disease, HTN   Social History: former smoker (quit 1985), no alcohol  Home BP readings:   Labs:    Latest Ref Rng & Units 11/12/2022   11:17 AM 07/25/2022    3:30 PM 07/24/2021    8:46 AM  BMP  Glucose 70 - 99 mg/dL 90  93  409   BUN 8 - 27 mg/dL 10  11  16    Creatinine 0.76 - 1.27 mg/dL 8.11  9.14  7.82   BUN/Creat Ratio 10 - 24 9  9     Sodium 134 - 144 mmol/L 149  144  142   Potassium 3.5 - 5.2 mmol/L 3.9  4.9  3.8   Chloride 96 - 106 mmol/L 109  104  108   CO2 20 -  29 mmol/L 25  28  28    Calcium 8.6 - 10.2 mg/dL 9.2  9.2  8.4      Wt Readings from Last 3 Encounters:  11/16/22 258 lb (117 kg)  11/12/22 264 lb (119.7 kg)  10/31/22 264 lb 12.8 oz (120.1 kg)   BP Readings from Last 3 Encounters:  11/16/22 (!) 125/59  11/12/22 128/64  10/31/22 (!) 150/72   Pulse Readings from Last 3 Encounters:  11/16/22 71  11/12/22 63  10/31/22 (!) 58    Renal function: CrCl cannot be calculated (Patient's most recent lab result is older than the maximum 21 days allowed.).  Past Medical History:  Diagnosis Date   A-fib [facility])    Angina    chest pain- cardiac cath. followed in 2010, record available ,  told then that he should f/u /w Dr. Manus [name]     Arthritis    R knee, back     Asthma    uses  singulair   Cancer  [facility])    prostate, melanoma- 2010, excision     CHF (congestive heart failure) (HCC)    Chronic kidney disease    prostate cancer - surg. removal- 1996   Colon polyps    Diverticulitis    Dysrhythmia    palpitations, followed by Dr.Ehinger, seen in prep for surgery on 03/27/2011    GERD (gastroesophageal reflux disease)    Hepatitis    jaundice- many yrs. ago   Hiatal hernia    Hypertension    Melanoma (HCC)    Face   OSA (obstructive sleep apnea) 04/11/2015   Mild to moderate OSA with AHI 13.3/hr overall and AHI 36.8/hr during REM sleep.  Oxygen saturations were as low as 86% during respiratory events.   Pneumonia    hosp. 20 yrs. ago   Prostate cancer (HCC) 1995   Sleep apnea    study done, 10 yrs. ago, told that he needed  CPAP but  never used     Current Outpatient Medications on File Prior to Visit  Medication Sig Dispense Refill   acetaminophen (TYLENOL) 500 MG tablet Take 2 tablets (1,000 mg total) by mouth every 6 (six) hours as needed. 30 tablet 0   albuterol (PROVENTIL) (2.5 MG/3ML) 0.083% nebulizer solution Take 2.5 mg by nebulization every 4 (four) hours as needed for shortness of breath.     albuterol (VENTOLIN HFA) 108 (90 Base) MCG/ACT inhaler Inhale 1-2 puffs into the lungs every 6 (six) hours as needed for wheezing or shortness of breath. 18 g 0   ammonium lactate (LAC-HYDRIN) 12 % lotion daily at 6 (six) AM.     Ascorbic Acid (VITAMIN C) 1000 MG tablet Take 1,000 mg by mouth daily.     azithromycin (ZITHROMAX) 250 MG tablet Take 250 mg by mouth 3 (three) times a week.     benzonatate (TESSALON) 100 MG capsule Take 200 mg by mouth daily as needed for cough.     Budeson-Glycopyrrol-Formoterol (BREZTRI AEROSPHERE) 160-9-4.8 MCG/ACT AERO Inhale 2 puffs into the lungs in the morning and at bedtime.     Calcium Carbonate-Vitamin D 600-200 MG-UNIT TABS Take 1 tablet by mouth 2 (two) times daily.     ELIQUIS 5 MG TABS tablet TAKE 1 TABLET BY MOUTH TWICE  DAILY 200 tablet 2    empagliflozin (JARDIANCE) 10 MG TABS tablet Take 1 tablet (10 mg total) by mouth daily. 30 tablet 9   flecainide (TAMBOCOR) 50 MG tablet Take 1 tablet (50 mg total) by  mouth 2 (two) times daily. 180 tablet 2   fluticasone (FLONASE) 50 MCG/ACT nasal spray Place 1 spray into both nostrils daily as needed for allergies or rhinitis.     furosemide (LASIX) 20 MG tablet TAKE 1 TABLET BY MOUTH DAILY 100 tablet 3   guaiFENesin 200 MG tablet Take 1 tablet (200 mg total) by mouth every 4 (four) hours as needed for cough or to loosen phlegm. 30 tablet 0   montelukast (SINGULAIR) 10 MG tablet Take 10 mg by mouth daily.     Multiple Vitamin (MULTIVITAMIN WITH MINERALS) TABS tablet Take 1 tablet by mouth daily.     niacinamide 500 MG tablet Take 500 mg by mouth 2 (two) times daily with a meal.     Omega-3 Fatty Acids (FISH OIL) 1200 MG CAPS Take 1,200 mg by mouth 2 (two) times daily.     omeprazole (PRILOSEC) 40 MG capsule Take 40 mg by mouth at bedtime.      polyvinyl alcohol (LIQUIFILM TEARS) 1.4 % ophthalmic solution Place 1 drop into both eyes as needed for dry eyes. 15 mL 0   rosuvastatin (CRESTOR) 20 MG tablet TAKE 1 TABLET BY MOUTH DAILY 90 tablet 3   telmisartan (MICARDIS) 80 MG tablet Take 1 tablet (80 mg total) by mouth daily.     No current facility-administered medications on file prior to visit.    Allergies  Allergen Reactions   Carvedilol Shortness Of Breath and Other (See Comments)    Fatigue   Tiotropium Rash   Zolpidem Other (See Comments)    Hallucinations   Entresto [Sacubitril-Valsartan] Cough   Morphine Other (See Comments)   Spiriva Respimat [Tiotropium Bromide Monohydrate] Other (See Comments)   Aminophylline Other (See Comments)    Unknown   Atenolol Other (See Comments)    Fatigue, Low HR   Lisinopril Cough     Assessment/Plan:  1. Hypertension -   HFpEF - BP notably improved and is at goal < 130/36mmHg today and with majority of home readings. Will check BMET  today with plan to continue Entresto 97-103 mg BID, Jardiance 10mg  daily, and furosemide 20 mg daily except Sundays. His Jardiance and Sherryll Burger are free through the [employer] that I enrolled him at his last appt. F/u with PharmD as needed.

## 2023-01-12 ENCOUNTER — Ambulatory Visit: Payer: Medicare Other | Attending: Cardiovascular Disease | Admitting: Pharmacist

## 2023-01-12 DIAGNOSIS — I10 Essential (primary) hypertension: Secondary | ICD-10-CM | POA: Diagnosis not present

## 2023-01-12 NOTE — Assessment & Plan Note (Signed)
Blood pressure well-controlled and close to goal of <130/80 mmHg according to home and office readings Continue to take telmisartan 80 mg daily and other HF medications (empagliflozin 10 mg daily, furosemide 20 mg daily) Patient to continue to periodically check BP at home and reach out to the clinic with any abnormal readings or questions/concerns No follow-up schedule at this time

## 2023-01-12 NOTE — Patient Instructions (Signed)
Your blood pressure is well-controlled. Continue taking telmisartan 80 mg daily and your other heart failure medication (furosemide 20 mg daily and empaglaflozin 10 mg daily). Please call if you have any questions/concerns. We will not schedule follow-up at this time.

## 2023-01-22 DIAGNOSIS — C3411 Malignant neoplasm of upper lobe, right bronchus or lung: Secondary | ICD-10-CM | POA: Diagnosis not present

## 2023-01-22 DIAGNOSIS — Z923 Personal history of irradiation: Secondary | ICD-10-CM | POA: Diagnosis not present

## 2023-01-29 DIAGNOSIS — I10 Essential (primary) hypertension: Secondary | ICD-10-CM | POA: Diagnosis not present

## 2023-01-29 DIAGNOSIS — I4891 Unspecified atrial fibrillation: Secondary | ICD-10-CM | POA: Diagnosis not present

## 2023-01-29 DIAGNOSIS — Z0000 Encounter for general adult medical examination without abnormal findings: Secondary | ICD-10-CM | POA: Diagnosis not present

## 2023-01-29 DIAGNOSIS — B370 Candidal stomatitis: Secondary | ICD-10-CM | POA: Diagnosis not present

## 2023-01-29 DIAGNOSIS — K219 Gastro-esophageal reflux disease without esophagitis: Secondary | ICD-10-CM | POA: Diagnosis not present

## 2023-01-29 DIAGNOSIS — J449 Chronic obstructive pulmonary disease, unspecified: Secondary | ICD-10-CM | POA: Diagnosis not present

## 2023-01-29 DIAGNOSIS — J309 Allergic rhinitis, unspecified: Secondary | ICD-10-CM | POA: Diagnosis not present

## 2023-01-29 DIAGNOSIS — E7800 Pure hypercholesterolemia, unspecified: Secondary | ICD-10-CM | POA: Diagnosis not present

## 2023-02-10 DIAGNOSIS — I10 Essential (primary) hypertension: Secondary | ICD-10-CM | POA: Diagnosis not present

## 2023-02-10 DIAGNOSIS — J449 Chronic obstructive pulmonary disease, unspecified: Secondary | ICD-10-CM | POA: Diagnosis not present

## 2023-02-10 DIAGNOSIS — B370 Candidal stomatitis: Secondary | ICD-10-CM | POA: Diagnosis not present

## 2023-02-11 DIAGNOSIS — H04123 Dry eye syndrome of bilateral lacrimal glands: Secondary | ICD-10-CM | POA: Diagnosis not present

## 2023-02-11 DIAGNOSIS — H26493 Other secondary cataract, bilateral: Secondary | ICD-10-CM | POA: Diagnosis not present

## 2023-02-11 DIAGNOSIS — H43813 Vitreous degeneration, bilateral: Secondary | ICD-10-CM | POA: Diagnosis not present

## 2023-03-03 DIAGNOSIS — R509 Fever, unspecified: Secondary | ICD-10-CM | POA: Diagnosis not present

## 2023-03-03 DIAGNOSIS — R0981 Nasal congestion: Secondary | ICD-10-CM | POA: Diagnosis not present

## 2023-03-03 DIAGNOSIS — R42 Dizziness and giddiness: Secondary | ICD-10-CM | POA: Diagnosis not present

## 2023-03-03 DIAGNOSIS — R051 Acute cough: Secondary | ICD-10-CM | POA: Diagnosis not present

## 2023-03-26 ENCOUNTER — Other Ambulatory Visit: Payer: Self-pay | Admitting: Internal Medicine

## 2023-04-02 ENCOUNTER — Encounter: Payer: Self-pay | Admitting: Internal Medicine

## 2023-04-23 ENCOUNTER — Telehealth: Payer: Self-pay | Admitting: Internal Medicine

## 2023-04-23 NOTE — Telephone Encounter (Signed)
Called pt spouse in regards to leg swelling.  Reports started about a week ago.  Left leg swelling is worse than right leg.  Pt was taken off of Jardiance d/t thrush.  While on Jardiance leg swelling improved.  Pt wears compression socks and elevates legs throughout the day.  Takes furosemide as ordered without missing doses.  Wife reports no added salt but pt eats deli meats and canned vegetables sometimes.    Pt has COPD SOB is no worse than before.  Does not wt daily.  BP 2/8-100/67. Will send message to Pharm D to see if there are any suggestions for swelling.

## 2023-04-23 NOTE — Telephone Encounter (Signed)
Pt c/o swelling/edema: STAT if pt has developed SOB within 24 hours  If swelling, where is the swelling located? Bi Lat edema  How much weight have you gained and in what time span? unsure  Have you gained 2 pounds in a day or 5 pounds in a week? unsure  Do you have a log of your daily weights (if so, list)? No  Are you currently taking a fluid pill? Yes  Are you currently SOB? COPD  Have you traveled recently in a car or plane for an extended period of time? No

## 2023-04-23 NOTE — Telephone Encounter (Signed)
Called spouse advised of Pharmacist recommendation: Recommend he take an additional 20mg  of furosemide and begin to monitor his weights   Pt will take an extra dose of Lasix today.  Spouse will call in to report status of swelling tomorrow.

## 2023-04-24 NOTE — Telephone Encounter (Signed)
Pt calling in to give report of weight gain which was up by 2lbs. Requesting cb

## 2023-04-24 NOTE — Telephone Encounter (Signed)
Spoke with pt and he explained that he hasn't weighed in about a week until today which is when he noticed the 2 lb weight gain. Explained to pt to continue following the directions Shamea, RN had given him yesterday about the additional lasix and to monitor his weight throughout the weekend. Pt verbalized understanding and had no further questions at this time.

## 2023-04-27 ENCOUNTER — Telehealth: Payer: Self-pay | Admitting: Internal Medicine

## 2023-04-27 NOTE — Telephone Encounter (Signed)
Please see separate telephone encounter from 04/27/23 for more details.

## 2023-04-27 NOTE — Telephone Encounter (Signed)
Called pt spouse to f/u leg swelling from 04/23/23.  Reports legs went down some but still not normal(with extra dose of furosemide).  Pt wt consistently at 253 lbs since Friday 04/24/23.  Reports pt felt so much better while on Jardiance would like to know if there is a different medication he can take.  Leg swelling was so much better on Jardiance.   Advised spouse there is [place] but it's likely it would also cause oral thrush.  Advised will send concern to MD to address.

## 2023-04-27 NOTE — Telephone Encounter (Signed)
Called to report Wt and BP readings:  Wt: 253 lbs BP: 113/74        137/68

## 2023-04-28 NOTE — Telephone Encounter (Signed)
Called pt spouse advised of MD response.  Spouse would like to know what to do for increased swelling.  Reports she has been giving pt double dose of lasix to help with swelling.  Leg swelling has improved with additional dose but not where they were while on Jardiance.  Advised spouse d/t pt potassium level would be care with giving extra lasix.  Scheduled an OV for 05/04/23 at 11 am.  Spouse thanked me for call no further concerns at this time.

## 2023-05-04 ENCOUNTER — Ambulatory Visit: Payer: Medicare Other | Attending: Internal Medicine

## 2023-05-04 ENCOUNTER — Ambulatory Visit: Payer: Medicare Other | Attending: Internal Medicine | Admitting: Internal Medicine

## 2023-05-04 VITALS — BP 140/68 | HR 73 | Ht 67.0 in | Wt 259.0 lb

## 2023-05-04 DIAGNOSIS — I480 Paroxysmal atrial fibrillation: Secondary | ICD-10-CM

## 2023-05-04 DIAGNOSIS — I5032 Chronic diastolic (congestive) heart failure: Secondary | ICD-10-CM

## 2023-05-04 DIAGNOSIS — I2510 Atherosclerotic heart disease of native coronary artery without angina pectoris: Secondary | ICD-10-CM

## 2023-05-04 MED ORDER — DAPAGLIFLOZIN PROPANEDIOL 10 MG PO TABS: 10.0000 mg | ORAL_TABLET | Freq: Every day | ORAL | Status: DC

## 2023-05-04 MED ORDER — DAPAGLIFLOZIN PROPANEDIOL 10 MG PO TABS: 10.0000 mg | ORAL_TABLET | Freq: Every day | ORAL | 11 refills | Status: AC

## 2023-05-04 NOTE — Progress Notes (Unsigned)
 Enrolled for [employer] to mail a ZIO XT long term holter monitor to the patients address on file.

## 2023-05-04 NOTE — Patient Instructions (Signed)
 Medication Instructions:  Your physician has recommended you make the following change in your medication:  START: dapagliflozin (Farxiga) 10 mg once daily before breakfast.  We have provided you with 2 weeks of samples.  *If you need a refill on your cardiac medications before your next appointment, please call your pharmacy*   Lab Work: IN 1-2 WEEKS: BMP  If you have labs (blood work) drawn today and your tests are completely normal, you will receive your results only by: MyChart Message (if you have MyChart) OR A paper copy in the mail If you have any lab test that is abnormal or we need to change your treatment, we will call you to review the results.   Testing/Procedures: 68- - - Your physician has requested that you have an echocardiogram. Echocardiography is a painless test that uses sound waves to create images of your heart. It provides your doctor with information about the size and shape of your heart and how well your heart's chambers and valves are working. This procedure takes approximately one hour. There are no restrictions for this procedure. Please do NOT wear cologne, perfume, aftershave, or lotions (deodorant is allowed). Please arrive 15 minutes prior to your appointment time.  Please note: We ask at that you not bring children with you during ultrasound (echo/ vascular) testing. Due to room size and safety concerns, children are not allowed in the ultrasound rooms during exams. Our front office staff cannot provide observation of children in our lobby area while testing is being conducted. An adult accompanying a patient to their appointment will only be allowed in the ultrasound room at the discretion of the ultrasound technician under special circumstances. We apologize for any inconvenience.    Follow-Up: At [facility], you and your health needs are our priority.  As part of our continuing mission to provide you with exceptional heart care, we have  created designated Provider Care Teams.  These Care Teams include your primary Cardiologist (physician) and Advanced Practice Providers (APPs -  Physician Assistants and Nurse Practitioners) who all work together to provide you with the care you need, when you need it.   Your next appointment:   3 month(s)  Provider:   Christell Constant, MD  or Tereso Newcomer, PA-C       Other Instructions [name] [name]- Long Term Monitor Instructions  Your physician has requested you wear a ZIO patch monitor for 7 days.  This is a single patch monitor. [employer] supplies one patch monitor per enrollment. Additional stickers are not available. Please do not apply patch if you will be having a Nuclear Stress Test,  Echocardiogram, Cardiac CT, MRI, or Chest Xray during the period you would be wearing the  monitor. The patch cannot be worn during these tests. You cannot remove and re-apply the  ZIO XT patch monitor.  Your ZIO patch monitor will be mailed 3 day USPS to your address on file. It may take 3-5 days  to receive your monitor after you have been enrolled.  Once you have received your monitor, please review the enclosed instructions. Your monitor  has already been registered assigning a specific monitor serial # to you.  Billing and Patient Assistance Program Information  We have supplied [employer] with any of your insurance information on file for billing purposes. [employer] offers a sliding scale Patient Assistance Program for patients that do not have  insurance, or whose insurance does not completely cover the cost of the ZIO monitor.  You must  apply for the Patient Assistance Program to qualify for this discounted rate.  To apply, please call [employer] at [phone], select option 4, select option 2, ask to apply for  Patient Assistance Program. Meredeth Ide will ask your household income, and how many people  are in your household. They will quote your out-of-pocket cost based on that information.   [employer] will also be able to set up a 72-month, interest-free payment plan if needed.  Applying the monitor   Shave hair from upper left chest.  Hold abrader disc by orange tab. Rub abrader in 40 strokes over the upper left chest as  indicated in your monitor instructions.  Clean area with 4 enclosed alcohol pads. Let dry.  Apply patch as indicated in monitor instructions. Patch will be placed under collarbone on left  side of chest with arrow pointing upward.  Rub patch adhesive wings for 2 minutes. Remove white label marked "1". Remove the white  label marked "2". Rub patch adhesive wings for 2 additional minutes.  While looking in a mirror, press and release button in center of patch. A small green light will  flash 3-4 times. This will be your only indicator that the monitor has been turned on.  Do not shower for the first 24 hours. You may shower after the first 24 hours.  Press the button if you feel a symptom. You will hear a small click. Record Date, Time and  Symptom in the Patient Logbook.  When you are ready to remove the patch, follow instructions on the last 2 pages of Patient  Logbook. Stick patch monitor onto the last page of Patient Logbook.  Place Patient Logbook in the blue and white box. Use locking tab on box and tape box closed  securely. The blue and white box has prepaid postage on it. Please place it in the mailbox as  soon as possible. Your physician should have your test results approximately 7 days after the  monitor has been mailed back to [facility].  Call [facility] Customer Care at [phone] if you have questions regarding  your ZIO XT patch monitor. Call them immediately if you see an orange light blinking on your  monitor.  If your monitor falls off in less than 4 days, contact our Monitor department at [phone].  If your monitor becomes loose or falls off after 4 days call [employer] at [phone] for  suggestions on securing your  monitor

## 2023-05-04 NOTE — Progress Notes (Signed)
 Cardiology Office Note:    Date:  05/04/2023   ID:  [name] [name], [name] [name] May 29, 1940, MRN [id number]  PCP:  Blair Heys, MD (Inactive)  Cardiologist:  Christell Constant, MD   Referring MD: No ref. provider found   CC: Follow up PAF  History of Present Illness:    [name] [name] is a 83 y.o. male with a hx of PAF - on Flecainide, chronic anticoagulation with Eliquis and on chronic Tambocor therapy, CAD per cath in 2017 with non obstructive disease noted, OSA, HTN, and CKD. 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  Recent fall with injury on Eliquis.  2023: Last seen by Dr. Katrinka Blazing- cleared for 1C agent  He has a history of paroxysmal atrial fibrillation and is currently on flecainide. Approximately two weeks ago, he experienced a spell of atrial fibrillation with sudden onset chest discomfort and jitteriness, described as feeling 'like you're out there' and 'going crazy or something'. These symptoms have resolved over the past three to four days. He is also on Eliquis for atrial fibrillation management.  He has been experiencing worsening leg swelling, particularly in the evenings. Previously, while on Jardiance, he noted less swelling but developed thrush, which has not completely resolved. He is concerned about the swelling's impact on his daily life. He is currently on Lasix (furosemide) for fluid management, which increases urination but helps with fluid retention. A recent higher dose of Lasix provided some relief but did not return him to normal.  He has a history of nonobstructive coronary artery disease and mild aortic dilation. Previous evaluations, including an echocardiogram and a CT scan, showed normal heart function and aortic size. He has had issues with medication changes in the past, particularly with blood pressure medications, such as a previous change to sacubitril valsartan Sherryll Burger) that caused a cough, leading to a switch back to a  medication that worked for him.  He has gained weight over the past week, which he attributes to resuming eating ice cream. He has a history of thrush, which he associates with both Jardiance and inhaler use.   Past Medical History:  Diagnosis Date   A-fib [facility])    Angina    chest pain- cardiac cath. followed in 2010, record available ,  told then that he should f/u /w Dr. Manus Gunning     Arthritis    R knee, back     Asthma    uses  singulair   Cancer [facility])    prostate, melanoma- 2010, excision     CHF (congestive heart failure) (HCC)    Chronic kidney disease    prostate cancer - surg. removal- 1996   Colon polyps    Diverticulitis    Dysrhythmia    palpitations, followed by Dr.Ehinger, seen in prep for surgery on 03/27/2011    GERD (gastroesophageal reflux disease)    Hepatitis    jaundice- many yrs. ago   Hiatal hernia    Hypertension    Melanoma (HCC)    Face   OSA (obstructive sleep apnea) 04/11/2015   Mild to moderate OSA with AHI 13.3/hr overall and AHI 36.8/hr during REM sleep.  Oxygen saturations were as low as 86% during respiratory events.   Pneumonia    hosp. 20 yrs. ago   Prostate cancer (HCC) 1995   Sleep apnea    study done, 10 yrs. ago, told that he needed  CPAP but  never used     Past Surgical  History:  Procedure Laterality Date   CARDIAC CATHETERIZATION     2010   CARDIAC CATHETERIZATION N/A 10/22/2015   Procedure: Left Heart Cath and Coronary Angiography;  Surgeon: Yvonne Kendall, MD;  Location: [facility] INVASIVE CV LAB;  Service: Cardiovascular;  Laterality: N/A;   CARDIAC CATHETERIZATION     CHOLECYSTECTOMY N/A 07/22/2021   Procedure: LAPAROSCOPIC CHOLECYSTECTOMY;  Surgeon: Emelia Loron, MD;  Location: [facility] OR;  Service: General;  Laterality: N/A;   FRACTURE SURGERY     L wrist, hardware- 1982   JOINT REPLACEMENT     L knee, 2009   LEFT HEART CATHETERIZATION WITH CORONARY ANGIOGRAM N/A 09/08/2011   Procedure: LEFT HEART CATHETERIZATION WITH CORONARY  ANGIOGRAM;  Surgeon: Lesleigh Noe, MD;  Location: [facility] CATH LAB;  Service: Cardiovascular;  Laterality: N/A;   LUNG CANCER SURGERY     PROSTATECTOMY     for ca   TONSILLECTOMY     as child   TOTAL KNEE ARTHROPLASTY  04/22/2011   Procedure: TOTAL KNEE ARTHROPLASTY;  Surgeon: Valeria Batman, MD;  Location: [facility] OR;  Service: Orthopedics;  Laterality: Right;    Current Medications: Current Meds  Medication Sig   acetaminophen (TYLENOL) 500 MG tablet Take 2 tablets (1,000 mg total) by mouth every 6 (six) hours as needed.   albuterol (PROVENTIL) (2.5 MG/3ML) 0.083% nebulizer solution Take 2.5 mg by nebulization every 4 (four) hours as needed for shortness of breath.   albuterol (VENTOLIN HFA) 108 (90 Base) MCG/ACT inhaler Inhale 1-2 puffs into the lungs every 6 (six) hours as needed for wheezing or shortness of breath.   ammonium lactate (LAC-HYDRIN) 12 % lotion daily at 6 (six) AM.   Ascorbic Acid (VITAMIN C) 1000 MG tablet Take 1,000 mg by mouth daily.   azithromycin (ZITHROMAX) 250 MG tablet Take 250 mg by mouth 3 (three) times a week.   benzonatate (TESSALON) 100 MG capsule Take 200 mg by mouth daily as needed for cough.   Calcium Carbonate-Vitamin D 600-200 MG-UNIT TABS Take 1 tablet by mouth 2 (two) times daily.   dapagliflozin propanediol (FARXIGA) 10 MG TABS tablet Take 1 tablet (10 mg total) by mouth daily before breakfast.   ELIQUIS 5 MG TABS tablet TAKE 1 TABLET BY MOUTH TWICE  DAILY   flecainide (TAMBOCOR) 50 MG tablet TAKE 1 TABLET BY MOUTH TWICE  DAILY   fluticasone (FLONASE) 50 MCG/ACT nasal spray Place 1 spray into both nostrils daily as needed for allergies or rhinitis.   furosemide (LASIX) 20 MG tablet TAKE 1 TABLET BY MOUTH DAILY   guaiFENesin 200 MG tablet Take 1 tablet (200 mg total) by mouth every 4 (four) hours as needed for cough or to loosen phlegm.   montelukast (SINGULAIR) 10 MG tablet Take 10 mg by mouth daily.   Multiple Vitamin (MULTIVITAMIN WITH MINERALS) TABS  tablet Take 1 tablet by mouth daily.   niacinamide 500 MG tablet Take 500 mg by mouth 2 (two) times daily with a meal.   nystatin (MYCOSTATIN) 100000 UNIT/ML suspension in the morning, at noon, and at bedtime.   Omega-3 Fatty Acids (FISH OIL) 1200 MG CAPS Take 1,200 mg by mouth 2 (two) times daily.   omeprazole (PRILOSEC) 40 MG capsule Take 40 mg by mouth at bedtime.    polyvinyl alcohol (LIQUIFILM TEARS) 1.4 % ophthalmic solution Place 1 drop into both eyes as needed for dry eyes.   rosuvastatin (CRESTOR) 20 MG tablet TAKE 1 TABLET BY MOUTH DAILY   telmisartan (MICARDIS) 80 MG  tablet Take 1 tablet (80 mg total) by mouth daily.   TRELEGY ELLIPTA 100-62.5-25 MCG/ACT AEPB Inhale into the lungs daily at 6 (six) AM.     Allergies:   Carvedilol, Tiotropium, Zolpidem, Entresto [sacubitril-valsartan], Morphine, Spiriva respimat [tiotropium bromide monohydrate], Aminophylline, Atenolol, and Lisinopril   Social History   Socioeconomic History   Marital status: Married    Spouse name: Not on file   Number of children: Not on file   Years of education: Not on file   Highest education level: Not on file  Occupational History   Not on file  Tobacco Use   Smoking status: Former    Current packs/day: 0.00    Average packs/day: 1.5 packs/day for 30.0 years (45.0 ttl pk-yrs)    Types: Cigarettes    Start date: 04/10/1953    Quit date: 04/11/1983    Years since quitting: 40.0   Smokeless tobacco: Never  Vaping Use   Vaping status: Never Used  Substance and Sexual Activity   Alcohol use: No   Drug use: No   Sexual activity: Not on file  Other Topics Concern   Not on file  Social History Narrative   ** Merged History Encounter **       Social Drivers of Health   Financial Resource Strain: Not on file  Food Insecurity: Not on file  Transportation Needs: Not on file  Physical Activity: Not on file  Stress: Not on file  Social Connections: Unknown (07/21/2021)   Received from [facility],  [facility]   Social Network    Social Network: Not on file     Family History: The patient's family history includes Alzheimer's disease in his father; Breast cancer in his mother; Crohn's disease in his brother; Heart disease in his brother; Hypertension in his brother. There is no history of Anesthesia problems, Hypotension, Malignant hyperthermia, or Pseudochol deficiency.  ROS:   Please see the history of present illness.     EKGs/Labs/Other Studies Reviewed:    The following studies were reviewed today:  Cardiac Studies & Procedures   ______________________________________________________________________________________________ CARDIAC CATHETERIZATION  CARDIAC CATHETERIZATION 10/22/2015  Narrative 1.  Mild luminal irregularities involving the RCA and LAD, similar to prior catheterizations.  No obstructive coronary artery disease. 2.  Mildly to moderately elevated left ventricular filling pressure (23 mmHg). 3.  Normal left ventricular contraction.  Plan: 1.  Continue medical management and primary prevention.  Findings Coronary Findings Diagnostic  Dominance: Right  Left Anterior Descending The vessel exhibits minimal luminal irregularities. Possible bridging noted in the mid/distal LAD, unchanged from prior catheterization.  First Diagonal Branch Vessel is moderate in size.  Second Diagonal Branch Vessel is moderate in size.  Ramus Intermedius Vessel is moderate in size.  Left Circumflex Vessel is moderate in size.  Lateral First Obtuse Marginal Branch Vessel is small in size.  Second Obtuse Marginal Branch Vessel is moderate in size.  Right Coronary Artery Vessel is large. The vessel exhibits minimal luminal irregularities.  Right Posterior Descending Artery The vessel exhibits minimal luminal irregularities.  Intervention  No interventions have been documented.   CARDIAC CATHETERIZATION  CARDIAC CATHETERIZATION 10/02/2008   STRESS  TESTS  NM MYOCAR MULTI W/SPECT W 03/18/2015  Narrative CLINICAL DATA:  Hypertension. Asthma. History of cardiac catheterization x2.  EXAM: MYOCARDIAL IMAGING WITH SPECT (REST AND PHARMACOLOGIC-STRESS)  GATED LEFT VENTRICULAR WALL MOTION STUDY  LEFT VENTRICULAR EJECTION FRACTION  TECHNIQUE: Standard myocardial SPECT imaging was performed after resting intravenous injection of 10 mCi Tc-42m sestamibi.  Subsequently, intravenous infusion of Lexiscan was performed under the supervision of the Cardiology staff. At peak effect of the drug, 30 mCi Tc-79m sestamibi was injected intravenously and standard myocardial SPECT imaging was performed. Quantitative gated imaging was also performed to evaluate left ventricular wall motion, and estimate left ventricular ejection fraction.  COMPARISON:  None.  FINDINGS: Perfusion: Fixed defect involving the apex and distal segments of the lateral wall identified. No reversible ischemia identified.  Wall Motion: There is mild lateral wall hypokinesis.  Left Ventricular Ejection Fraction: 55 %  End diastolic volume 104 ml  End systolic volume 46 ml  IMPRESSION: 1. No reversible ischemia.  Apical and lateral wall infarct .  2. Lateral wall hypokinesis.  3. Left ventricular ejection fraction 55%  4. Low risk-risk stress test findings*.  *2012 Appropriate Use Criteria for Coronary Revascularization Focused Update: J Am Coll Cardiol. 2012;59(9):857-881. http://content.dementiazones.com.aspx?articleid=1201161   Electronically Signed By: Signa Kell M.D. On: 03/18/2015 16:40   ECHOCARDIOGRAM  ECHOCARDIOGRAM COMPLETE 08/27/2022  Narrative ECHOCARDIOGRAM REPORT    Patient Name:   [name] [name] Date of Exam: 08/27/2022 Medical Rec #:  [id number]         Height:       67.0 in Accession #:    [id number]        Weight:       260.0 lb Date of Birth:  Jul 10, 1940         BSA:          2.261 m Patient Age:    82 years           BP:           144/78 mmHg Patient Gender: M                 HR:           69 bpm. Exam Location:  [address]  Procedure: 2D Echo, Color Doppler, Cardiac Doppler and 3D Echo  Indications:    Shortness of Breath R06.02  History:        Patient has prior history of Echocardiogram examinations, most recent 10/08/2020. Arrythmias:Atrial Fibrillation; Risk Factors:Hypertension.  Sonographer:    Thurman Coyer RDCS Referring Phys: [id number] [facility] [place]  IMPRESSIONS   1. Left ventricular ejection fraction, by estimation, is 60 to 65%. The left ventricle has normal function. The left ventricle has no regional wall motion abnormalities. There is mild left ventricular hypertrophy. Left ventricular diastolic parameters are consistent with Grade I diastolic dysfunction (impaired relaxation). 2. Right ventricular systolic function is normal. The right ventricular size is normal. There is mildly elevated pulmonary artery systolic pressure. 3. The mitral valve is normal in structure. No evidence of mitral valve regurgitation. 4. The aortic valve is tricuspid. Aortic valve regurgitation is not visualized. 5. There is mild dilatation of the ascending aorta, measuring 42 mm. 6. The inferior vena cava is dilated in size with >50% respiratory variability, suggesting right atrial pressure of 8 mmHg.  FINDINGS Left Ventricle: Left ventricular ejection fraction, by estimation, is 60 to 65%. The left ventricle has normal function. The left ventricle has no regional wall motion abnormalities. The left ventricular internal cavity size was normal in size. There is mild left ventricular hypertrophy. Left ventricular diastolic parameters are consistent with Grade I diastolic dysfunction (impaired relaxation).  Right Ventricle: The right ventricular size is normal. Right ventricular systolic function is normal. There is mildly elevated pulmonary artery systolic pressure. The tricuspid regurgitant  velocity is 2.87 m/s, and  with an assumed right atrial pressure of 8 mmHg, the estimated right ventricular systolic pressure is 40.9 mmHg.  Left Atrium: Left atrial size was normal in size.  Right Atrium: Right atrial size was normal in size.  Pericardium: There is no evidence of pericardial effusion.  Mitral Valve: The mitral valve is normal in structure. No evidence of mitral valve regurgitation.  Tricuspid Valve: Tricuspid valve regurgitation is mild.  Aortic Valve: The aortic valve is tricuspid. Aortic valve regurgitation is not visualized.  Pulmonic Valve: Pulmonic valve regurgitation is mild to moderate.  Aorta: There is mild dilatation of the ascending aorta, measuring 42 mm.  Venous: The inferior vena cava is dilated in size with greater than 50% respiratory variability, suggesting right atrial pressure of 8 mmHg.  IAS/Shunts: No atrial level shunt detected by color flow Doppler.   LEFT VENTRICLE PLAX 2D LVIDd:         5.40 cm   Diastology LVIDs:         3.60 cm   LV e' medial:    7.18 cm/s LV PW:         1.10 cm   LV E/e' medial:  12.5 LV IVS:        1.10 cm   LV e' lateral:   7.80 cm/s LVOT diam:     2.30 cm   LV E/e' lateral: 11.5 LV SV:         86 LV SV Index:   38 LVOT Area:     4.15 cm  3D Volume EF: 3D EF:        64 % LV EDV:       102 ml LV ESV:       37 ml LV SV:        66 ml  RIGHT VENTRICLE RV Basal diam:  5.10 cm RV Mid diam:    4.20 cm RV S prime:     19.10 cm/s TAPSE (M-mode): 2.8 cm  LEFT ATRIUM             Index        RIGHT ATRIUM           Index LA diam:        4.70 cm 2.08 cm/m   RA Area:     23.50 cm LA Vol (A2C):   47.6 ml 21.05 ml/m  RA Volume:   77.60 ml  34.32 ml/m LA Vol (A4C):   51.4 ml 22.74 ml/m LA Biplane Vol: 50.8 ml 22.47 ml/m AORTIC VALVE LVOT Vmax:   83.80 cm/s LVOT Vmean:  56.600 cm/s LVOT VTI:    0.208 m  AORTA Ao Root diam: 3.70 cm Ao Asc diam:  4.20 cm  MITRAL VALVE                TRICUSPID VALVE MV Area  (PHT): 3.65 cm     TR Peak grad:   32.9 mmHg MV Decel Time: 208 msec     TR Vmax:        287.00 cm/s MV E velocity: 89.60 cm/s MV A velocity: 115.00 cm/s  SHUNTS MV E/A ratio:  0.78         Systemic VTI:  0.21 m Systemic Diam: 2.30 cm  Carolan Clines Electronically signed by Carolan Clines Signature Date/Time: 08/27/2022/12:16:33 PM    Final    MONITORS  CARDIAC EVENT MONITOR 02/15/2015  Narrative  Atrial fibrillation with moderate rate control  Atrial fibrillation documented   CT SCANS  CT CORONARY  MORPH W/CTA COR W/SCORE 08/07/2022  Addendum 08/12/2022  8:28 PM ADDENDUM REPORT: 08/12/2022 20:26  EXAM: OVER-READ INTERPRETATION  CT CHEST  The following report is an over-read performed by radiologist Dr. Aram Candela of [facility] on 08/12/2022. This over-read does not include interpretation of cardiac or coronary anatomy or pathology. The coronary calcium score/coronary CTA interpretation by the cardiologist is attached.  COMPARISON:  June 02, 2022  FINDINGS: Cardiovascular: There are no significant extracardiac vascular findings.  Mediastinum/Nodes: There are no enlarged lymph nodes within the visualized mediastinum.  Lungs/Pleura: There is no pleural effusion. The visualized lungs appear clear.  Upper abdomen: There is a small hiatal hernia.  Musculoskeletal/Chest wall: No chest wall mass or suspicious osseous findings within the visualized chest.  IMPRESSION: No significant extracardiac findings within the visualized chest.   Electronically Signed By: Aram Candela M.D. On: 08/12/2022 20:26  Narrative CLINICAL DATA:  This is a 83 year old male with anginal symptoms.  EXAM: Cardiac/Coronary  CTA  TECHNIQUE: The patient was scanned on a [employer].  FINDINGS: A 100 kV prospective scan was triggered in the descending thoracic aorta at 111 HU's. Axial non-contrast 3 mm slices were carried out through the heart. The  data set was analyzed on a dedicated work station and scored using the Agatson method. Gantry rotation speed was 250 msecs and collimation was .6 mm. No beta blockade and 0.8 mg of sl NTG was given. The 3D data set was reconstructed in 5% intervals of the 67-82 % of the R-R cycle. Diastolic phases were analyzed on a dedicated work station using MPR, MIP and VRT modes. The patient received 80 cc of contrast.  Image Quality: Fair with misregistration artifact.  Aorta: Normal size.  No calcifications.  No dissection.  Aortic Valve:  Trileaflet.  No calcifications.  Coronary Arteries:  Normal coronary origin.  Right dominance.  RCA is a large dominant artery that gives rise to PDA and PLA. The proximal and mid RCA with diffuse mild (25-49%) calcified plaques. The distal RCA with minimal (<24%) in the distal RCA.  Left main is a large artery that gives rise to LAD and LCX arteries.  LAD is a large vessel. Diffuse minimal calcified plaques in the proximal and mid LAD. Distal LAD with no plaques. D1 vessel with minimal calcification in the proximal portion of the vessel.  LCX is a non-dominant artery that gives rise to one large OM1 branch. Diffuse minimal calcified plaques in the proximal LCX.  Coronary Calcium Score:  Left main: 0  Left anterior descending artery: 57.8  Left circumflex artery: 99.3  Right coronary artery: 725  Total: 882  Percentile: 60  Other findings:  Normal pulmonary vein drainage into the left atrium.  Normal left atrial appendage without a thrombus.  Normal size of the pulmonary artery.  IMPRESSION: 1. Coronary calcium score of 882. This was 60 percentile for age and sex matched control.  2. Normal coronary origin with right dominance.  3. CAD-RADS 1. Minimal non-obstructive CAD (0-24%). Consider non-atherosclerotic causes of chest pain. Consider preventive therapy and risk factor modification.  The noncardiac portion of this study will  be interpreted in separate report by the radiologist.  Electronically Signed: By: Thomasene Ripple D.O. On: 08/07/2022 13:18     ______________________________________________________________________________________________      Recent Labs: 07/25/2022: NT-Pro BNP 220 11/12/2022: BUN 10; Creatinine, Ser 1.17; Potassium 3.9; Sodium 149 12/15/2022: ALT 8  Recent Lipid Panel    Component Value Date/Time  CHOL 167 12/15/2022 1148   TRIG 125 12/15/2022 1148   HDL 43 12/15/2022 1148   CHOLHDL 3.9 12/15/2022 1148   CHOLHDL 3.5 02/28/2017 0218   VLDL 10 02/28/2017 0218   LDLCALC 102 (H) 12/15/2022 1148    Physical Exam:    VS:  BP (!) 140/68 (BP Location: Right Arm)   Pulse 73   Ht 5\' 7"  (1.702 m)   Wt 259 lb (117.5 kg)   SpO2 97%   BMI 40.57 kg/m     Wt Readings from Last 3 Encounters:  05/04/23 259 lb (117.5 kg)  11/16/22 258 lb (117 kg)  11/12/22 264 lb (119.7 kg)    GEN: Morbid obesity. No acute distress HEENT: Normal NECK: No JVD. CARDIAC: No murmur. RRR  VASCULAR:  Normal Pulses. No bruits. RESPIRATORY:  Clear to auscultation without rales, wheezing or rhonchi  ABDOMEN: Soft, non-tender, non-distended MUSCULOSKELETAL: No deformity  SKIN: Warm and dry NEUROLOGIC:  Alert and oriented x 3 PSYCHIATRIC:  Normal affect   ASSESSMENT:    1. Coronary artery disease involving native coronary artery of native heart without angina pectoris   2. PAF (paroxysmal atrial fibrillation) (HCC)   3. Chronic heart failure with preserved ejection fraction (HCC)    PLAN:    Paroxysmal Atrial Fibrillation Paroxysmal atrial fibrillation managed with flecainide. Recent chest discomfort and jitteriness suggestive of recurrence. Concerns about long-term flecainide use due to potential risks in coronary artery disease and heart failure. Discussed ablation as an option if AFib burden is significant, including procedural risks and potential to eliminate flecainide. - Monitor AFib burden for  one week - Consider ablation if significant AFib burden; I am trying to get him off Flecainide but he has had issues   Heart Failure with Preserved Ejection Fraction Heart failure with preserved ejection fraction presenting with leg swelling and recent weight gain. Previous good response to Jardiance and low-dose Lasix, but experienced thrush. Discussed re-challenging with SGLT2 inhibitors, including mortality benefits and risk of thrush recurrence. Alternative of doubling Lasix with potassium supplementation discussed. - Trial of Farxiga - Monitor for thrush - Check BMP in 1-2 weeks - Double Lasix and adding potassium if Marcelline Deist not tolerated  Nonobstructive Coronary Artery Disease Nonobstructive coronary artery disease. Cleared for flecainide by Dr. Katrinka Blazing. No recent changes in coronary status. - Continue current management  Mild Aortic Dilation Mild aortic dilation noted on previous echocardiogram; repeat 08/2023  Thrush Thrush potentially related to inhaler use and previous SGLT2 inhibitor therapy. Discussed risks of re-challenging with SGLT2 inhibitors. - Monitor for recurrence of thrush with Farxiga trial  General Health Maintenance Discussed importance of managing fluid retention and monitoring heart health. Emphasized mortality benefit of SGLT2 inhibitors. - Educate on fluid management and medication adherence  Follow-up - Follow-up with me to Tereso Newcomer  3 months - Send courtesy message to Dr. Judithann Sheen.   Medication Adjustments/Labs and Tests Ordered: Current medicines are reviewed at length with the patient today.  Concerns regarding medicines are outlined above.  Orders Placed This Encounter  Procedures   Basic metabolic panel   LONG TERM MONITOR (3-14 DAYS)   ECHOCARDIOGRAM COMPLETE    Meds ordered this encounter  Medications   dapagliflozin propanediol (FARXIGA) 10 MG TABS tablet    Sig: Take 1 tablet (10 mg total) by mouth daily before breakfast.    Dispense:   30 tablet    Refill:  11     Patient Instructions  Medication Instructions:  Your physician has recommended you make the  following change in your medication:  START: dapagliflozin (Farxiga) 10 mg once daily before breakfast.  We have provided you with 2 weeks of samples.  *If you need a refill on your cardiac medications before your next appointment, please call your pharmacy*   Lab Work: IN 1-2 WEEKS: BMP  If you have labs (blood work) drawn today and your tests are completely normal, you will receive your results only by: MyChart Message (if you have MyChart) OR A paper copy in the mail If you have any lab test that is abnormal or we need to change your treatment, we will call you to review the results.   Testing/Procedures: 19- - - Your physician has requested that you have an echocardiogram. Echocardiography is a painless test that uses sound waves to create images of your heart. It provides your doctor with information about the size and shape of your heart and how well your heart's chambers and valves are working. This procedure takes approximately one hour. There are no restrictions for this procedure. Please do NOT wear cologne, perfume, aftershave, or lotions (deodorant is allowed). Please arrive 15 minutes prior to your appointment time.  Please note: We ask at that you not bring children with you during ultrasound (echo/ vascular) testing. Due to room size and safety concerns, children are not allowed in the ultrasound rooms during exams. Our front office staff cannot provide observation of children in our lobby area while testing is being conducted. An adult accompanying a patient to their appointment will only be allowed in the ultrasound room at the discretion of the ultrasound technician under special circumstances. We apologize for any inconvenience.    Follow-Up: At [facility], you and your health needs are our priority.  As part of our continuing mission  to provide you with exceptional heart care, we have created designated Provider Care Teams.  These Care Teams include your primary Cardiologist (physician) and Advanced Practice Providers (APPs -  Physician Assistants and Nurse Practitioners) who all work together to provide you with the care you need, when you need it.   Your next appointment:   3 month(s)  Provider:   Christell Constant, MD  or Tereso Newcomer, PA-C       Other Instructions Christena Deem- Long Term Monitor Instructions  Your physician has requested you wear a ZIO patch monitor for 7 days.  This is a single patch monitor. [employer] supplies one patch monitor per enrollment. Additional stickers are not available. Please do not apply patch if you will be having a Nuclear Stress Test,  Echocardiogram, Cardiac CT, MRI, or Chest Xray during the period you would be wearing the  monitor. The patch cannot be worn during these tests. You cannot remove and re-apply the  ZIO XT patch monitor.  Your ZIO patch monitor will be mailed 3 day USPS to your address on file. It may take 3-5 days  to receive your monitor after you have been enrolled.  Once you have received your monitor, please review the enclosed instructions. Your monitor  has already been registered assigning a specific monitor serial # to you.  Billing and Patient Assistance Program Information  We have supplied [employer] with any of your insurance information on file for billing purposes. [employer] offers a sliding scale Patient Assistance Program for patients that do not have  insurance, or whose insurance does not completely cover the cost of the ZIO monitor.  You must apply for the Patient Assistance Program to qualify for this  discounted rate.  To apply, please call [employer] at [phone], select option 4, select option 2, ask to apply for  Patient Assistance Program. Meredeth Ide will ask your household income, and how many people  are in your household. They will quote your  out-of-pocket cost based on that information.  [employer] will also be able to set up a 47-month, interest-free payment plan if needed.  Applying the monitor   Shave hair from upper left chest.  Hold abrader disc by orange tab. Rub abrader in 40 strokes over the upper left chest as  indicated in your monitor instructions.  Clean area with 4 enclosed alcohol pads. Let dry.  Apply patch as indicated in monitor instructions. Patch will be placed under collarbone on left  side of chest with arrow pointing upward.  Rub patch adhesive wings for 2 minutes. Remove white label marked "1". Remove the white  label marked "2". Rub patch adhesive wings for 2 additional minutes.  While looking in a mirror, press and release button in center of patch. A small green light will  flash 3-4 times. This will be your only indicator that the monitor has been turned on.  Do not shower for the first 24 hours. You may shower after the first 24 hours.  Press the button if you feel a symptom. You will hear a small click. Record Date, Time and  Symptom in the Patient Logbook.  When you are ready to remove the patch, follow instructions on the last 2 pages of Patient  Logbook. Stick patch monitor onto the last page of Patient Logbook.  Place Patient Logbook in the blue and white box. Use locking tab on box and tape box closed  securely. The blue and white box has prepaid postage on it. Please place it in the mailbox as  soon as possible. Your physician should have your test results approximately 7 days after the  monitor has been mailed back to [facility].  Call [facility] Customer Care at [phone] if you have questions regarding  your ZIO XT patch monitor. Call them immediately if you see an orange light blinking on your  monitor.  If your monitor falls off in less than 4 days, contact our Monitor department at [phone].  If your monitor becomes loose or falls off after 4 days call [employer] at  [phone] for  suggestions on securing your monitor        Signed, Christell Constant, MD  05/04/2023 12:45 PM    [facility]

## 2023-05-04 NOTE — Addendum Note (Signed)
 Addended by: Macie Burows on: 05/04/2023 02:15 PM   Modules accepted: Orders

## 2023-05-08 DIAGNOSIS — I5032 Chronic diastolic (congestive) heart failure: Secondary | ICD-10-CM | POA: Diagnosis not present

## 2023-05-08 DIAGNOSIS — I2510 Atherosclerotic heart disease of native coronary artery without angina pectoris: Secondary | ICD-10-CM | POA: Diagnosis not present

## 2023-05-08 DIAGNOSIS — I480 Paroxysmal atrial fibrillation: Secondary | ICD-10-CM | POA: Diagnosis not present

## 2023-05-14 LAB — BASIC METABOLIC PANEL
BUN/Creatinine Ratio: 14 (ref 10–24)
BUN: 16 mg/dL (ref 8–27)
CO2: 25 mmol/L (ref 20–29)
Calcium: 9.3 mg/dL (ref 8.6–10.2)
Chloride: 103 mmol/L (ref 96–106)
Creatinine, Ser: 1.17 mg/dL (ref 0.76–1.27)
Glucose: 88 mg/dL (ref 70–99)
Potassium: 4.2 mmol/L (ref 3.5–5.2)
Sodium: 143 mmol/L (ref 134–144)
eGFR: 61 mL/min/{1.73_m2} (ref >59)

## 2023-05-24 ENCOUNTER — Encounter: Payer: Self-pay | Admitting: Internal Medicine

## 2023-07-09 DIAGNOSIS — J42 Unspecified chronic bronchitis: Secondary | ICD-10-CM | POA: Diagnosis not present

## 2023-07-09 DIAGNOSIS — R052 Subacute cough: Secondary | ICD-10-CM | POA: Diagnosis not present

## 2023-07-09 DIAGNOSIS — R918 Other nonspecific abnormal finding of lung field: Secondary | ICD-10-CM | POA: Diagnosis not present

## 2023-07-20 ENCOUNTER — Other Ambulatory Visit: Payer: Self-pay | Admitting: Internal Medicine

## 2023-07-23 DIAGNOSIS — Z923 Personal history of irradiation: Secondary | ICD-10-CM | POA: Diagnosis not present

## 2023-07-23 DIAGNOSIS — C3411 Malignant neoplasm of upper lobe, right bronchus or lung: Secondary | ICD-10-CM | POA: Diagnosis not present

## 2023-07-31 DIAGNOSIS — I700 Atherosclerosis of aorta: Secondary | ICD-10-CM | POA: Diagnosis not present

## 2023-07-31 DIAGNOSIS — E782 Mixed hyperlipidemia: Secondary | ICD-10-CM | POA: Diagnosis not present

## 2023-07-31 DIAGNOSIS — I4891 Unspecified atrial fibrillation: Secondary | ICD-10-CM | POA: Diagnosis not present

## 2023-07-31 DIAGNOSIS — J449 Chronic obstructive pulmonary disease, unspecified: Secondary | ICD-10-CM | POA: Diagnosis not present

## 2023-07-31 DIAGNOSIS — M79643 Pain in unspecified hand: Secondary | ICD-10-CM | POA: Diagnosis not present

## 2023-07-31 DIAGNOSIS — I10 Essential (primary) hypertension: Secondary | ICD-10-CM | POA: Diagnosis not present

## 2023-07-31 DIAGNOSIS — N183 Chronic kidney disease, stage 3 unspecified: Secondary | ICD-10-CM | POA: Diagnosis not present

## 2023-07-31 DIAGNOSIS — B370 Candidal stomatitis: Secondary | ICD-10-CM | POA: Diagnosis not present

## 2023-07-31 DIAGNOSIS — K219 Gastro-esophageal reflux disease without esophagitis: Secondary | ICD-10-CM | POA: Diagnosis not present

## 2023-07-31 DIAGNOSIS — M79642 Pain in left hand: Secondary | ICD-10-CM | POA: Diagnosis not present

## 2023-07-31 DIAGNOSIS — M79641 Pain in right hand: Secondary | ICD-10-CM | POA: Diagnosis not present

## 2023-07-31 DIAGNOSIS — I5022 Chronic systolic (congestive) heart failure: Secondary | ICD-10-CM | POA: Diagnosis not present

## 2023-08-04 NOTE — Progress Notes (Unsigned)
 Cardiology Office Note:  .   Date:  08/05/2023  ID:  [name] [name], [name] [name] 08-12-1940, MRN [id number] PCP: [name] Memory, MD (Inactive)  [facility] Providers Cardiologist:  [name] Melody, MD  History of Present Illness: .   [name] [name] is a 83 y.o. male with history of PAF, chronic HFpEF nonobstructive CAD by cath 2017, mild dilatation of ascending aorta, OSA, hypertension, hyperlipidemia, CKD, Merkel cell carcinoma 2011, lung cancer status post radiation, prostate cancer     CAD Nonobstructive disease by heart cath 2017 Reporting exertional fatigue/chest pain, underwent coronary CTA May 2024, nonobstructive CAD, CAC 882. Echo June 2024, preserved biventricular disease, no significant valvular disease  PAF Previously managed on flecainide /Eliquis  Increased complaints of jitteriness, heart monitor 05/2023 no evidence of atrial fibrillation.  Ectopy likely contributing to jitteriness Last documented atrial fibrillation 2017  Chronic HFpEF Most recent echocardiogram 08/2022 with preserved biventricular function with no significant valvular disease.  Mild dilatation of ascending aorta 42 mm. Reports Entresto  caused cough and adamant about not being on any medications that would exacerbate her cough.  Lung cancer Followed by pulmonology  occupational exposure to cotton, dust, carpeting, smoker October 2024, CT with new solid pulmonary nodules,     We have primarily been following patient for history of PAF in which he has had recent occurrence of chest discomfort and jitteriness.  At the last appointment heart monitor was ordered for 1 week to evaluate burden to help determine if he would benefit from ablation.  Heart monitor showing no A-fib.  Additionally, he was having increased peripheral edema.  Patient was concerned about Jardiance  causing his thrush but also on inhaler.  Farxiga  was retrialed.  Echocardiogram ordered.  Today patient presents for follow-up,  reports continued but mild and tolerable palpitations.  Otherwise he does seem more short of breath than usual.  Notes that this has been somewhat of a new complaint for the last 6 months with activity.  Weight has remained unchanged, reports good urinary output on his Lasix  20 mg p.o.  Does not note any orthopnea.  His thrush is resolving.   ROS: Denies: Chest pain, orthopnea, fatigue, lightheadedness.   Studies Reviewed: [name] [name]    EKG Interpretation Date/Time:  Wednesday Aug 05 2023 11:36:11 EDT Ventricular Rate:  73 PR Interval:  216 QRS Duration:  104 QT Interval:  392 QTC Calculation: 431 R Axis:   -55  Text Interpretation: Sinus rhythm with 1st degree A-V block with Premature atrial complexes Left anterior fascicular block When compared with ECG of 21-Jul-2021 11:52, PREVIOUS ECG IS PRESENT Confirmed by [name] [name] [phone]) on 08/05/2023 11:41:56 AM    Risk Assessment/Calculations:    CHA2DS2-VASc Score = 5  This indicates a 7.2% annual risk of stroke. The patient's score is based upon: CHF History: 1 HTN History: 1 Diabetes History: 0 Stroke History: 0 Vascular Disease History: 1 Age Score: 2 Gender Score: 0            Physical Exam:   VS:  BP 124/62   Pulse 73   Ht 5\' 7"  (1.702 m)   Wt 259 lb 3.2 oz (117.6 kg)   SpO2 96%   BMI 40.60 kg/m    Wt Readings from Last 3 Encounters:  08/05/23 259 lb 3.2 oz (117.6 kg)  05/04/23 259 lb (117.5 kg)  11/16/22 258 lb (117 kg)    GEN: Well nourished, well developed in no acute distress NECK: No JVD; No carotid bruits CARDIAC: RRR, no murmurs, rubs,  gallops RESPIRATORY:  Clear to auscultation without rales, wheezing or rhonchi  ABDOMEN: Soft, non-tender, non-distended EXTREMITIES:  + edema; No deformity   ASSESSMENT AND PLAN: .    PAF Reporting new symptoms of chest discomfort/jitteriness.  Heart monitor x 1 week with no evidence of atrial fibrillation, some ectopy noted which probably explaining his jitteriness.  Remains  to be in sinus rhythm today Per MD okay to continue flecainide  50 mg twice daily.  If he has worsening symptoms could consider discontinuing flecainide  and starting bisoprolol 2.5 mg daily (given history of COPD).   If increased burden in the future could consider ablation. Also seems to be mild HFpEF exacerbation, we will work this up further but if he has evidence of structural disease or worsening CHF, flecainide  not ideal. Continue Eliquis  5 mg twice daily  Nonobstructive CAD Noted on heart catheterization 2017.  No anginal complaints No aspirin  with Eliquis ,  LDL is well-controlled.  Most recent lipid panel May 2025 LDL 41, continue with rosuvastatin  20 mg daily  Chronic HFpEF Most recent echocardiogram was June 2024 with preserved biventricular function and no significant valvular disease.  Seems to be in mild exacerbation right now, he has been noting increased shortness of breath last 6 months but no orthopnea.  Seems to be more short of breath upon arrival but may have been due to distance and a combination of COPD, obesity, deconditioning, mildly volume up.  Weight has been stable and today is 259. Continue with telmisartan  80 mg daily He is on Lasix  20 mg daily, will increase this to twice daily for the next 3 to 5 days depending on his response and if this will help his shortness of breath.  He will report back to us  within the week to let us  know. Check BMP today.  Check again after 1 week.  Get BNP.  This will help differentiate whether this is related to volume or if multifactorial.  May be suspiciously low given obesity though. Echocardiogram is pending and scheduled June 24. At follow-up may consider adding on spironolactone.  Want to get him euvolemic first though.  Thrush More likely related to inhaler rather than SGLT2 inhibitor.  He knows about rinsing his mouth after each use.  This has resolved.  Mild dilatation of ascending aorta 42 mm on echocardiogram as above.   Continue to monitor annually, repeat June 2025.  COPD Lung cancer Does not seem to be in exacerbation, no wheezing appreciated on exam.  Follows with pulmonology.  OSA Reassess at upcoming appointment, reportedly was told he did not need this but sleep study over 75 years old.  Certainly high risk for it.        Dispo: Follow-up July 1 to assess shortness of breath.  Titrate GDMT if and follow-up on echocardiogram.  Signed, Burnetta Cart, PA-C

## 2023-08-05 ENCOUNTER — Encounter: Payer: Self-pay | Admitting: Physician Assistant

## 2023-08-05 ENCOUNTER — Ambulatory Visit: Payer: Medicare Other | Attending: Physician Assistant | Admitting: Cardiology

## 2023-08-05 ENCOUNTER — Other Ambulatory Visit: Payer: Self-pay | Admitting: *Deleted

## 2023-08-05 VITALS — BP 124/62 | HR 73 | Ht 67.0 in | Wt 259.2 lb

## 2023-08-05 DIAGNOSIS — I5032 Chronic diastolic (congestive) heart failure: Secondary | ICD-10-CM

## 2023-08-05 DIAGNOSIS — G4733 Obstructive sleep apnea (adult) (pediatric): Secondary | ICD-10-CM | POA: Diagnosis not present

## 2023-08-05 DIAGNOSIS — J449 Chronic obstructive pulmonary disease, unspecified: Secondary | ICD-10-CM | POA: Diagnosis not present

## 2023-08-05 DIAGNOSIS — I480 Paroxysmal atrial fibrillation: Secondary | ICD-10-CM

## 2023-08-05 DIAGNOSIS — I2510 Atherosclerotic heart disease of native coronary artery without angina pectoris: Secondary | ICD-10-CM | POA: Diagnosis not present

## 2023-08-05 NOTE — Patient Instructions (Addendum)
 Medication Instructions:  Your physician has recommended you make the following change in your medication:   INCREASE the Lasix  (Furosemide ) to twice a day for 3 -5 days  *If you need a refill on your cardiac medications before your next appointment, please call your pharmacy*  Lab Work: TODAY:  BMET & PRO BNP  1 WEEK:  COME BACK TO THE LAB AND GET A BMET  If you have labs (blood work) drawn today and your tests are completely normal, you will receive your results only by: MyChart Message (if you have MyChart) OR A paper copy in the mail If you have any lab test that is abnormal or we need to change your treatment, we will call you to review the results.  Testing/Procedures: None ordered   Follow-Up: At [facility], you and your health needs are our priority.  As part of our continuing mission to provide you with exceptional heart care, our providers are all part of one team.  This team includes your primary Cardiologist (physician) and Advanced Practice Providers or APPs (Physician Assistants and Nurse Practitioners) who all work together to provide you with the care you need, when you need it.  Your next appointment:    6week(s)  09/08/23   Provider:   Morgan Arab, PA  We recommend signing up for the patient portal called "MyChart".  Sign up information is provided on this After Visit Summary.  MyChart is used to connect with patients for Virtual Visits (Telemedicine).  Patients are able to view lab/test results, encounter notes, upcoming appointments, etc.  Non-urgent messages can be sent to your provider as well.   To learn more about what you can do with MyChart, go to [email or web address].   Other Instructions

## 2023-08-06 LAB — BASIC METABOLIC PANEL WITH GFR
BUN/Creatinine Ratio: 10 (ref 10–24)
BUN: 12 mg/dL (ref 8–27)
CO2: 23 mmol/L (ref 20–29)
Calcium: 9 mg/dL (ref 8.6–10.2)
Chloride: 105 mmol/L (ref 96–106)
Creatinine, Ser: 1.18 mg/dL (ref 0.76–1.27)
Glucose: 87 mg/dL (ref 70–99)
Potassium: 4.1 mmol/L (ref 3.5–5.2)
Sodium: 145 mmol/L — ABNORMAL HIGH (ref 134–144)
eGFR: 60 mL/min/{1.73_m2} (ref >59)

## 2023-08-06 LAB — PRO B NATRIURETIC PEPTIDE: NT-Pro BNP: 328 pg/mL (ref 0–486)

## 2023-08-10 ENCOUNTER — Ambulatory Visit: Payer: Self-pay | Admitting: Cardiology

## 2023-08-10 NOTE — Telephone Encounter (Signed)
Attempted to call pt, no answer at this time.

## 2023-08-10 NOTE — Telephone Encounter (Signed)
-----   Message from [name] [name] sent at 08/10/2023 12:46 PM EDT ----- Normal labs, please let me know how he is doing as far as shortness of breath

## 2023-08-12 DIAGNOSIS — I2510 Atherosclerotic heart disease of native coronary artery without angina pectoris: Secondary | ICD-10-CM | POA: Diagnosis not present

## 2023-08-12 DIAGNOSIS — I480 Paroxysmal atrial fibrillation: Secondary | ICD-10-CM | POA: Diagnosis not present

## 2023-08-13 LAB — BASIC METABOLIC PANEL WITH GFR
BUN/Creatinine Ratio: 13 (ref 10–24)
BUN: 14 mg/dL (ref 8–27)
CO2: 24 mmol/L (ref 20–29)
Calcium: 9 mg/dL (ref 8.6–10.2)
Chloride: 105 mmol/L (ref 96–106)
Creatinine, Ser: 1.06 mg/dL (ref 0.76–1.27)
Glucose: 95 mg/dL (ref 70–99)
Potassium: 4.1 mmol/L (ref 3.5–5.2)
Sodium: 145 mmol/L — ABNORMAL HIGH (ref 134–144)
eGFR: 69 mL/min/{1.73_m2} (ref >59)

## 2023-08-28 DIAGNOSIS — H10503 Unspecified blepharoconjunctivitis, bilateral: Secondary | ICD-10-CM | POA: Diagnosis not present

## 2023-08-31 DIAGNOSIS — C3411 Malignant neoplasm of upper lobe, right bronchus or lung: Secondary | ICD-10-CM | POA: Diagnosis not present

## 2023-08-31 DIAGNOSIS — J439 Emphysema, unspecified: Secondary | ICD-10-CM | POA: Diagnosis not present

## 2023-08-31 DIAGNOSIS — Z923 Personal history of irradiation: Secondary | ICD-10-CM | POA: Diagnosis not present

## 2023-08-31 DIAGNOSIS — I700 Atherosclerosis of aorta: Secondary | ICD-10-CM | POA: Diagnosis not present

## 2023-09-01 ENCOUNTER — Ambulatory Visit (HOSPITAL_COMMUNITY)
Admission: RE | Admit: 2023-09-01 | Discharge: 2023-09-01 | Disposition: A | Discharge status: Home / Self Care | Payer: Medicare Other | Source: Ambulatory Visit | Attending: Internal Medicine | Admitting: Internal Medicine

## 2023-09-01 DIAGNOSIS — I2510 Atherosclerotic heart disease of native coronary artery without angina pectoris: Secondary | ICD-10-CM

## 2023-09-01 DIAGNOSIS — I5032 Chronic diastolic (congestive) heart failure: Secondary | ICD-10-CM | POA: Diagnosis not present

## 2023-09-01 DIAGNOSIS — I480 Paroxysmal atrial fibrillation: Secondary | ICD-10-CM

## 2023-09-01 LAB — ECHOCARDIOGRAM COMPLETE
Area-P 1/2: 2.07 cm2
S' Lateral: 3.2 cm

## 2023-09-04 ENCOUNTER — Ambulatory Visit: Payer: Self-pay

## 2023-09-04 DIAGNOSIS — H16103 Unspecified superficial keratitis, bilateral: Secondary | ICD-10-CM | POA: Diagnosis not present

## 2023-09-04 DIAGNOSIS — H1820 Unspecified corneal edema: Secondary | ICD-10-CM | POA: Diagnosis not present

## 2023-09-07 DIAGNOSIS — L578 Other skin changes due to chronic exposure to nonionizing radiation: Secondary | ICD-10-CM | POA: Diagnosis not present

## 2023-09-07 DIAGNOSIS — L821 Other seborrheic keratosis: Secondary | ICD-10-CM | POA: Diagnosis not present

## 2023-09-07 DIAGNOSIS — L570 Actinic keratosis: Secondary | ICD-10-CM | POA: Diagnosis not present

## 2023-09-07 DIAGNOSIS — C44629 Squamous cell carcinoma of skin of left upper limb, including shoulder: Secondary | ICD-10-CM | POA: Diagnosis not present

## 2023-09-08 ENCOUNTER — Ambulatory Visit: Payer: Self-pay | Admitting: Cardiology

## 2023-09-08 ENCOUNTER — Encounter: Payer: Self-pay | Admitting: Cardiology

## 2023-09-08 ENCOUNTER — Ambulatory Visit: Attending: Cardiology | Admitting: Cardiology

## 2023-09-08 VITALS — BP 140/70 | HR 70 | Ht 67.0 in | Wt 270.4 lb

## 2023-09-08 DIAGNOSIS — J449 Chronic obstructive pulmonary disease, unspecified: Secondary | ICD-10-CM | POA: Diagnosis not present

## 2023-09-08 DIAGNOSIS — I10 Essential (primary) hypertension: Secondary | ICD-10-CM | POA: Diagnosis not present

## 2023-09-08 DIAGNOSIS — G4733 Obstructive sleep apnea (adult) (pediatric): Secondary | ICD-10-CM | POA: Diagnosis not present

## 2023-09-08 DIAGNOSIS — I2510 Atherosclerotic heart disease of native coronary artery without angina pectoris: Secondary | ICD-10-CM

## 2023-09-08 DIAGNOSIS — I5032 Chronic diastolic (congestive) heart failure: Secondary | ICD-10-CM | POA: Diagnosis not present

## 2023-09-08 DIAGNOSIS — I480 Paroxysmal atrial fibrillation: Secondary | ICD-10-CM | POA: Diagnosis not present

## 2023-09-08 DIAGNOSIS — E785 Hyperlipidemia, unspecified: Secondary | ICD-10-CM

## 2023-09-08 NOTE — Progress Notes (Signed)
 Cardiology Office Note:  .   Date:  09/08/2023  ID:  [name] [name], [name] [name] October 08, 1940, MRN [id number] PCP: Hugh Charleston, MD (Inactive)  [facility] Providers Cardiologist:  Stanly DELENA Leavens, MD {  History of Present Illness: .   [name] [name] is a 82 y.o. male with history of PAF, chronic HFpEF nonobstructive CAD by cath 2017, mild dilatation of ascending aorta, OSA, hypertension, hyperlipidemia,  CKD, Merkel cell carcinoma 2011, lung cancer status post radiation, prostate cancer      CAD Nonobstructive disease by heart cath 2017 Reporting exertional fatigue/chest pain, underwent coronary CTA May 2024, nonobstructive CAD, CAC 882.   PAF Last documented atrial fibrillation 2017 Managed on flecainide /Eliquis  Increased complaints of jitteriness, heart monitor 05/2023 no evidence of atrial fibrillation.  Ectopy correlated to triggered events   Chronic HFpEF Most recent echocardiogram 08/2023 with preserved biventricular function with no significant valvular disease.  RVSP 42.9, mildly elevated. Reports Entresto  caused cough and adamant about not being on any medications that would exacerbate cough. Reports having thrush on Jardiance , Farxiga  retrialed. Reported cough with farxiga  07/01, stopped by patient.    Lung cancer Followed by pulmonology  occupational exposure to cotton, dust, carpeting, smoker October 2024, CT with new solid pulmonary nodules,     We have been primarily following patient for history of paroxysmal atrial fibrillation although last documentation noted in 2017.  Well-controlled on flecainide .  For the last 2 appointments he has been reporting increased peripheral edema and chest discomfort/jitteriness but recent heart monitor in March 2025 showing no evidence of atrial fibrillation, he had triggered events that correlated to his PACs/PVCs.  Although only about 3%.  At our last office visit I had increased his Lasix  20 mg to twice daily for 3 to 5  days to see if this helped his symptoms.  Today patient is accompanied with his wife again, they feel that he did experience improved shortness of breath after doubling up his Lasix  and this significantly helped his lower extremity swelling.  They tell me that he stopped his Farxiga  due to cough.  Overall they feel content with current therapies and did not want any changes.  Did not offer any other complaints or questions.  He has been playing in his garage and likes to tinker with different little projects.  ROS: Denies: Chest pain, shortness of breath, orthopnea, palpitations, decreased exercise intolerance, fatigue, lightheadedness.   Studies Reviewed: SABRA    EKG Interpretation Date/Time:  Tuesday September 08 2023 14:40:14 EDT Ventricular Rate:  70 PR Interval:  206 QRS Duration:  106 QT Interval:  414 QTC Calculation: 447 R Axis:   -67  Text Interpretation: Sinus rhythm with marked sinus arrhythmia Left axis deviation Pulmonary disease pattern Confirmed by Darryle Currier [phone]) on 09/08/2023 3:02:12 PM    Risk Assessment/Calculations:    CHA2DS2-VASc Score = 5   This indicates a 7.2% annual risk of stroke. The patient's score is based upon: CHF History: 1 HTN History: 1 Diabetes History: 0 Stroke History: 0 Vascular Disease History: 1 Age Score: 2 Gender Score: 0      Physical Exam:   VS:  BP (!) 140/70   Pulse 70   Ht 5' 7 (1.702 m)   Wt 270 lb 6.4 oz (122.7 kg)   SpO2 96%   BMI 42.35 kg/m    Wt Readings from Last 3 Encounters:  09/08/23 270 lb 6.4 oz (122.7 kg)  08/05/23 259 lb 3.2 oz (117.6 kg)  05/04/23 259 lb (  117.5 kg)    GEN: Well nourished, well developed in no acute distress NECK: No JVD; No carotid bruits CARDIAC: RRR, no murmurs, rubs, gallops RESPIRATORY:  Clear to auscultation without rales, wheezing or rhonchi  ABDOMEN: Soft, non-tender, non-distended EXTREMITIES:  1-2+ edema; No deformity   ASSESSMENT AND PLAN: .    PAF Chronically stays in sinus  rhythm.  Last documented atrial fibrillation 2017.  Had been reporting episodes of palpitations, heart monitor March 2025 shows no atrial fibrillation.  Content with current meds.  Sinus rhythm today. Per MD okay to continue flecainide  50 mg twice daily.  If he has worsening symptoms could consider discontinuing flecainide  and starting bisoprolol 2.5 mg daily (given history of COPD).   If increased burden in the future could consider ablation. Continue Eliquis  5 mg twice daily   Nonobstructive CAD Noted on heart catheterization 2017.  No anginal complaints No aspirin  with Eliquis . LDL is well-controlled.  Most recent lipid panel May 2025 LDL 41, continue with rosuvastatin  20 mg daily   Chronic HFpEF Shortness of breath He has chronic having shortness of breath that likely is multifactorial due to underlying COPD, deconditioning, volume issues.  Last visit we change his Lasix  from daily to twice daily and he did experience improved symptoms. Today he does have peripheral edema and 10 pound weight gain.  Not having any significant shortness of breath that is far from baseline.  He says he is content with current therapy and does not want anything changed.  Also updated echocardiogram June 2025 with preserved biventricular function and no significant valvular disease.   Continue with telmisartan  80 mg daily, continue Lasix  20 mg daily.  He can increase to twice daily dosing for peripheral edema/shortness of breath.  His wife has been doing this for years and feels this is decently managed. In the future I would be in favor of increasing diuretic regiment or adding spironolactone.  Again does not want any changes. Stopped Jardiance  due to thrush.  He self stopped Farxiga  due to cough despite feeling better off of this medication He is adamant about not being on any medications that would exacerbate cough   Mild dilatation of ascending aorta Noted on echocardiogram in 2024.  June 2025 echocardiogram  does not make any mention of this.     COPD Lung cancer Seems stable.  Follows with pulmonology.   OSA He reports that he does not have this and has remote sleep study over 10 years ago.  Does not want to be on CPAP.       Dispo: 27-month follow-up with primary Dr. Santo  Signed, Thom LITTIE Sluder, PA-C

## 2023-09-08 NOTE — Patient Instructions (Signed)
  Follow-Up: At [facility], you and your health needs are our priority.  As part of our continuing mission to provide you with exceptional heart care, our providers are all part of one team.  This team includes your primary Cardiologist (physician) and Advanced Practice Providers or APPs (Physician Assistants and Nurse Practitioners) who all work together to provide you with the care you need, when you need it.  Your next appointment:   6 month(s)  Provider:   Stanly DELENA Leavens, MD    We recommend signing up for the patient portal called MyChart.  Sign up information is provided on this After Visit Summary.  MyChart is used to connect with patients for Virtual Visits (Telemedicine).  Patients are able to view lab/test results, encounter notes, upcoming appointments, etc.  Non-urgent messages can be sent to your provider as well.   To learn more about what you can do with MyChart, go to [email or web address].

## 2023-09-10 DIAGNOSIS — H10412 Chronic giant papillary conjunctivitis, left eye: Secondary | ICD-10-CM | POA: Diagnosis not present

## 2023-09-10 DIAGNOSIS — H1820 Unspecified corneal edema: Secondary | ICD-10-CM | POA: Diagnosis not present

## 2023-09-30 ENCOUNTER — Other Ambulatory Visit: Payer: Self-pay | Admitting: Nurse Practitioner

## 2023-09-30 DIAGNOSIS — I480 Paroxysmal atrial fibrillation: Secondary | ICD-10-CM

## 2023-10-01 NOTE — Telephone Encounter (Signed)
 Prescription refill request for Eliquis  received. Indication: Afib  Last office visit: 09/08/23 Kai)  Scr: 1.06 (08/12/23)  Age: 83 Weight: 122.7kg  Appropriate dose. Refill sent.

## 2023-10-13 DIAGNOSIS — R918 Other nonspecific abnormal finding of lung field: Secondary | ICD-10-CM | POA: Diagnosis not present

## 2023-10-14 DIAGNOSIS — C44629 Squamous cell carcinoma of skin of left upper limb, including shoulder: Secondary | ICD-10-CM | POA: Diagnosis not present

## 2023-10-14 DIAGNOSIS — L570 Actinic keratosis: Secondary | ICD-10-CM | POA: Diagnosis not present

## 2023-12-08 ENCOUNTER — Other Ambulatory Visit: Payer: Self-pay | Admitting: Nurse Practitioner

## 2023-12-08 ENCOUNTER — Other Ambulatory Visit: Payer: Self-pay | Admitting: Internal Medicine

## 2023-12-22 DIAGNOSIS — L820 Inflamed seborrheic keratosis: Secondary | ICD-10-CM | POA: Diagnosis not present

## 2023-12-22 DIAGNOSIS — L570 Actinic keratosis: Secondary | ICD-10-CM | POA: Diagnosis not present

## 2023-12-22 DIAGNOSIS — L821 Other seborrheic keratosis: Secondary | ICD-10-CM | POA: Diagnosis not present

## 2023-12-22 DIAGNOSIS — L578 Other skin changes due to chronic exposure to nonionizing radiation: Secondary | ICD-10-CM | POA: Diagnosis not present

## 2024-01-06 ENCOUNTER — Other Ambulatory Visit: Payer: Self-pay | Admitting: Internal Medicine

## 2024-03-11 ENCOUNTER — Encounter: Payer: Self-pay | Admitting: Internal Medicine

## 2024-03-11 ENCOUNTER — Ambulatory Visit: Payer: Self-pay | Attending: Internal Medicine | Admitting: Internal Medicine

## 2024-03-11 VITALS — BP 128/66 | HR 63 | Ht 66.0 in | Wt 264.8 lb

## 2024-03-11 DIAGNOSIS — I480 Paroxysmal atrial fibrillation: Secondary | ICD-10-CM

## 2024-03-11 DIAGNOSIS — I10 Essential (primary) hypertension: Secondary | ICD-10-CM

## 2024-03-11 DIAGNOSIS — G4733 Obstructive sleep apnea (adult) (pediatric): Secondary | ICD-10-CM

## 2024-03-11 NOTE — Progress Notes (Signed)
 " Cardiology Office Note:    Date:  03/11/2024   ID:  [name] [name], [name] [name] 1940-12-11, MRN [id number]  PCP:  [name] Charleston, MD (Inactive)  Cardiologist:  [name] DELENA Leavens, MD   Referring MD: No ref. provider found   CC: Follow up PAF  History of Present Illness:    [name] [name] is a 84 y.o. male with a hx of PAF - on Flecainide , chronic anticoagulation with Eliquis  and on chronic Tambocor  therapy, CAD per cath in 2017 with non obstructive disease noted, OSA, HTN, and CKD. 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  Recent fall with injury on Eliquis .  2023: Last seen by [name] [name]- cleared for 1C agent 2024: Thrush on SGLT2i 2025: Saw [name] [name]- increase lasix . DYAD Care: [name] Sluder PA-C (formerly)  [name] [name] is an 84 year old male with atrial fibrillation, heart failure with preserved ejection fraction, and nonobstructive coronary artery disease who presents for follow-up of his cardiac conditions.  He has a history of atrial fibrillation and is currently on flecainide  without any issues. There have been no recent episodes of atrial fibrillation, and he has no chest pain. He states, 'I don't seem to have no issues with it.'  He has heart failure with preserved ejection fraction and has experienced volume overload issues. He was previously on Farxiga  but discontinued it due to thrush. Following this, he experienced fluid retention, which improved with an increased dose of Lasix . The swelling in his legs is better, although some swelling occurs later in the day. No significant weight gain or difficulty lying flat is reported.  He reports worsening shortness of breath and increased fatigue, stating, 'my breathing has got a little worser.' He tires more quickly than before. He is under the care of a pulmonologist and has a CT scan scheduled for his lungs due to a history of lung cancer. He has been receiving regular scans, which are now annual.  He has a  history of nonobstructive coronary artery disease and was told he had some blockages, but not terrible, by [name] [name]. He is on a blood thinner.  He has obstructive sleep apnea with an apnea-hypopnea index of 37 per hour during REM sleep. He experiences dry mouth, which may be exacerbated by his diuretic use, and sleeps with his mouth open.  Past Medical History:  Diagnosis Date   A-fib [facility])    Angina    chest pain- cardiac cath. followed in 2010, record available ,  told then that he should f/u /w Dr. hugh     Arthritis    R knee, back     Asthma    uses  singulair    Cancer (HCC)    prostate, melanoma- 2010, excision     CHF (congestive heart failure) (HCC)    Chronic kidney disease    prostate cancer - surg. removal- 1996   Colon polyps    Diverticulitis    Dysrhythmia    palpitations, followed by [name] [name], seen in prep for surgery on 03/27/2011    GERD (gastroesophageal reflux disease)    Hepatitis    jaundice- many yrs. ago   Hiatal hernia    Hypertension    Melanoma (HCC)    Face   OSA (obstructive sleep apnea) 04/11/2015   Mild to moderate OSA with AHI 13.3/hr overall and AHI 36.8/hr during REM sleep.  Oxygen  saturations were as low as 86% during respiratory events.   Pneumonia    hosp.  20 yrs. ago   Prostate cancer (HCC) 1995   Sleep apnea    study done, 10 yrs. ago, told that he needed  CPAP but  never used     Past Surgical History:  Procedure Laterality Date   CARDIAC CATHETERIZATION     2010   CARDIAC CATHETERIZATION N/A 10/22/2015   Procedure: Left Heart Cath and Coronary Angiography;  Surgeon: [name] Hanson, MD;  Location[facility] INVASIVE CV LAB;  Service: Cardiovascular;  Laterality: N/A;   CARDIAC CATHETERIZATION     CHOLECYSTECTOMY N/A 07/22/2021   Procedure: LAPAROSCOPIC CHOLECYSTECTOMY;  Surgeon: [name] Cough, MD;  Location: [facility] OR;  Service: General;  Laterality: N/A;   FRACTURE SURGERY     L wrist, hardware- 1982   JOINT REPLACEMENT     L knee,  2009   LEFT HEART CATHETERIZATION WITH CORONARY ANGIOGRAM N/A 09/08/2011   Procedure: LEFT HEART CATHETERIZATION WITH CORONARY ANGIOGRAM;  Surgeon: [name] LELON [name] DOUGLAS, MD;  Location: [facility] CATH LAB;  Service: Cardiovascular;  Laterality: N/A;   LUNG CANCER SURGERY     PROSTATECTOMY     for ca   TONSILLECTOMY     as child   TOTAL KNEE ARTHROPLASTY  04/22/2011   Procedure: TOTAL KNEE ARTHROPLASTY;  Surgeon: [name] LELON Right, MD;  Location: [facility] OR;  Service: Orthopedics;  Laterality: [name];    Current Medications: Current Meds  Medication Sig   acetaminophen  (TYLENOL ) 500 MG tablet Take 2 tablets (1,000 mg total) by mouth every 6 (six) hours as needed.   albuterol  (PROVENTIL ) (2.5 MG/3ML) 0.083% nebulizer solution Take 2.5 mg by nebulization every 4 (four) hours as needed for shortness of breath.   albuterol  (VENTOLIN  HFA) 108 (90 Base) MCG/ACT inhaler Inhale 1-2 puffs into the lungs every 6 (six) hours as needed for wheezing or shortness of breath.   ammonium lactate (LAC-HYDRIN) 12 % lotion daily at 6 (six) AM.   apixaban  (ELIQUIS ) 5 MG TABS tablet TAKE 1 TABLET BY MOUTH TWICE  DAILY   Ascorbic Acid (VITAMIN C) 1000 MG tablet Take 1,000 mg by mouth daily.   azithromycin (ZITHROMAX) 250 MG tablet Take 250 mg by mouth 3 (three) times a week.   benzonatate  (TESSALON ) 100 MG capsule Take 200 mg by mouth daily as needed for [name].   Calcium  Carbonate-Vitamin D  600-200 MG-UNIT TABS Take 1 tablet by mouth 2 (two) times daily.   dapagliflozin  propanediol (FARXIGA ) 10 MG TABS tablet Take 1 tablet (10 mg total) by mouth daily before breakfast.   doxycycline  (VIBRA -TABS) 100 MG tablet Take 100 mg by mouth 2 (two) times daily.   flecainide  (TAMBOCOR ) 50 MG tablet TAKE 1 TABLET BY MOUTH TWICE  DAILY   fluticasone  (FLONASE ) 50 MCG/ACT nasal spray Place 1 spray into both nostrils daily as needed for allergies or rhinitis.   furosemide  (LASIX ) 20 MG tablet TAKE 1 TABLET BY MOUTH DAILY   guaiFENesin  200 MG  tablet Take 1 tablet (200 mg total) by mouth every 4 (four) hours as needed for [name] or to loosen phlegm.   montelukast  (SINGULAIR ) 10 MG tablet Take 10 mg by mouth daily.   Multiple Vitamin (MULTIVITAMIN WITH MINERALS) TABS tablet Take 1 tablet by mouth daily.   neomycin-polymyxin-dexamethasone  (MAXITROL) 0.1 % ophthalmic suspension Place 1 drop into both eyes as directed.   niacinamide 500 MG tablet Take 500 mg by mouth 2 (two) times daily with a meal.   nystatin (MYCOSTATIN) 100000 UNIT/ML suspension in the morning, at noon, and at bedtime.   Omega-3 Fatty  Acids (FISH OIL) 1200 MG CAPS Take 1,200 mg by mouth 2 (two) times daily.   omeprazole (PRILOSEC) 40 MG capsule Take 40 mg by mouth at bedtime.    polyvinyl alcohol  (LIQUIFILM TEARS) 1.4 % ophthalmic solution Place 1 drop into both eyes as needed for dry eyes.   prednisoLONE acetate (PRED FORTE) 1 % ophthalmic suspension Place 1 drop into both eyes every 2 (two) hours while awake.   rosuvastatin  (CRESTOR ) 20 MG tablet TAKE 1 TABLET BY MOUTH DAILY   telmisartan  (MICARDIS ) 80 MG tablet Take 1 tablet (80 mg total) by mouth daily.   TRELEGY ELLIPTA 100-62.5-25 MCG/ACT AEPB Inhale into the lungs daily at 6 (six) AM.     Allergies:   Carvedilol, Tiotropium, Zolpidem, Entresto  [sacubitril-valsartan], Morphine , Spiriva  respimat [tiotropium bromide ], Aminophylline, Atenolol, and Lisinopril   Social History   Socioeconomic History   Marital status: Married    Spouse name: Not on file   Number of children: Not on file   Years of education: Not on file   Highest education level: Not on file  Occupational History   Not on file  Tobacco Use   Smoking status: Former    Current packs/day: 0.00    Average packs/day: 1.5 packs/day for 30.0 years (45.0 ttl pk-yrs)    Types: Cigarettes    Start date: 04/10/1953    Quit date: 04/11/1983    Years since quitting: 40.9   Smokeless tobacco: Never  Vaping Use   Vaping status: Never Used  Substance and  Sexual Activity   Alcohol  use: No   Drug use: No   Sexual activity: Not on file  Other Topics Concern   Not on file  Social History Narrative   ** Merged History Encounter **       Social Drivers of Health   Tobacco Use: Medium Risk (03/11/2024)   Patient History    Smoking Tobacco Use: Former    Smokeless Tobacco Use: Never    Passive Exposure: Not on Actuary Strain: Not on file  Food Insecurity: Not on file  Transportation Needs: Not on file  Physical Activity: Not on file  Stress: Not on file  Social Connections: Unknown (07/21/2021)   Received from [facility]   Social Network    Social Network: Not on file  Depression (PHQ2-9): Not on file  Alcohol  Screen: Not on file  Housing: Not on file  Utilities: Not on file  Health Literacy: Not on file     Family History: The patient's family history includes Alzheimer's disease in his father; Breast cancer in his mother; Crohn's disease in his brother; Heart disease in his brother; Hypertension in his brother. There is no history of Anesthesia problems, Hypotension, Malignant hyperthermia, or Pseudochol deficiency.  ROS:   Please see the history of present illness.     EKGs/Labs/Other Studies Reviewed:    The following studies were reviewed today:  Cardiac Studies & Procedures   ______________________________________________________________________________________________ CARDIAC CATHETERIZATION  CARDIAC CATHETERIZATION 10/22/2015  Conclusion 1.  Mild luminal irregularities involving the RCA and LAD, similar to prior catheterizations.  No obstructive coronary artery disease. 2.  Mildly to moderately elevated left ventricular filling pressure (23 mmHg). 3.  Normal left ventricular contraction.  Plan: 1.  Continue medical management and primary prevention.  Findings Coronary Findings Diagnostic  Dominance: [name]  Left Anterior Descending The vessel exhibits minimal luminal irregularities.  Possible bridging noted in the mid/distal LAD, unchanged from prior catheterization.  First Diagonal Branch Vessel is moderate  in size.  Second Diagonal Branch Vessel is moderate in size.  Ramus Intermedius Vessel is moderate in size.  Left Circumflex Vessel is moderate in size.  Lateral First Obtuse Marginal Branch Vessel is small in size.  Second Obtuse Marginal Branch Vessel is moderate in size.  [name] Coronary Artery Vessel is large. The vessel exhibits minimal luminal irregularities.  [name] Posterior Descending Artery The vessel exhibits minimal luminal irregularities.  Intervention  No interventions have been documented.   CARDIAC CATHETERIZATION  CARDIAC CATHETERIZATION 10/02/2008   STRESS TESTS  NM MYOCAR MULTI W/SPECT W 03/18/2015  Narrative CLINICAL DATA:  Hypertension. Asthma. History of cardiac catheterization x2.  EXAM: MYOCARDIAL IMAGING WITH SPECT (REST AND PHARMACOLOGIC-STRESS)  GATED LEFT VENTRICULAR WALL MOTION STUDY  LEFT VENTRICULAR EJECTION FRACTION  TECHNIQUE: Standard myocardial SPECT imaging was performed after resting intravenous injection of 10 mCi Tc-80m sestamibi. Subsequently, intravenous infusion of Lexiscan  was performed under the supervision of the Cardiology staff. At peak effect of the drug, 30 mCi Tc-45m sestamibi was injected intravenously and standard myocardial SPECT imaging was performed. Quantitative gated imaging was also performed to evaluate left ventricular wall motion, and estimate left ventricular ejection fraction.  COMPARISON:  None.  FINDINGS: Perfusion: Fixed defect involving the apex and distal segments of the lateral wall identified. No reversible ischemia identified.  Wall Motion: There is mild lateral wall hypokinesis.  Left Ventricular Ejection Fraction: 55 %  End diastolic volume 104 ml  End systolic volume 46 ml  IMPRESSION: 1. No reversible ischemia.  Apical and lateral wall infarct  .  2. Lateral wall hypokinesis.  3. Left ventricular ejection fraction 55%  4. Low risk-risk stress test findings*.  *2012 Appropriate Use Criteria for Coronary Revascularization Focused Update: J Am Coll Cardiol. 2012;59(9):857-881. http://content.dementiazones.com.aspx?articleid=1201161   Electronically Signed By: Waddell Calk M.D. On: 03/18/2015 16:40   ECHOCARDIOGRAM  ECHOCARDIOGRAM COMPLETE 09/01/2023  Narrative ECHOCARDIOGRAM REPORT    Patient Name:   [name] [name] Date of Exam: 09/01/2023 Medical Rec #:  [id number]         Height:       67.0 in Accession #:    [id number]        Weight:       259.2 lb Date of Birth:  1940-05-23         BSA:          2.258 m Patient Age:    83 years          BP:           162/88 mmHg Patient Gender: M                 HR:           71 bpm. Exam Location:  [address]  Procedure: 2D Echo, Cardiac Doppler and Color Doppler (Both Spectral and Color Flow Doppler were utilized during procedure).  Indications:    I25.10 CAD  History:        Patient has prior history of Echocardiogram examinations, most recent 08/27/2022. CKD, Arrythmias:Atrial Fibrillation, Signs/Symptoms:Chest Pain and Shortness of Breath; Risk Factors:Hypertension and Sleep Apnea.  Sonographer:    Waldo Guadalajara RCS Referring Phys: [id number] Alfa Leibensperger A Gracielynn Birkel  IMPRESSIONS   1. Left ventricular ejection fraction, by estimation, is 55 to 60%. The left ventricle has normal function. The left ventricle has no regional wall motion abnormalities. There is moderate concentric left ventricular hypertrophy. Left ventricular diastolic parameters are consistent with Grade I diastolic dysfunction (impaired relaxation). 2. [name] ventricular  systolic function is normal. The [name] ventricular size is normal. There is mildly elevated pulmonary artery systolic pressure. The estimated [name] ventricular systolic pressure is 42.9 mmHg. 3. Left atrial size was mildly  dilated. 4. The mitral valve is normal in structure. Trivial mitral valve regurgitation. No evidence of mitral stenosis. 5. The aortic valve is normal in structure. There is mild calcification of the aortic valve. Aortic valve regurgitation is not visualized. Aortic valve sclerosis/calcification is present, without any evidence of aortic stenosis. 6. The inferior vena cava is normal in size with greater than 50% respiratory variability, suggesting [name] atrial pressure of 3 mmHg.  Conclusion(s)/Recommendation(s): Technically limited study due to poor sound wave transmission.  FINDINGS Left Ventricle: Left ventricular ejection fraction, by estimation, is 55 to 60%. The left ventricle has normal function. The left ventricle has no regional wall motion abnormalities. The left ventricular internal cavity size was normal in size. There is moderate concentric left ventricular hypertrophy. Left ventricular diastolic parameters are consistent with Grade I diastolic dysfunction (impaired relaxation).  [name] Ventricle: The [name] ventricular size is normal. No increase in [name] ventricular wall thickness. [name] ventricular systolic function is normal. There is mildly elevated pulmonary artery systolic pressure. The tricuspid regurgitant velocity is 3.16 m/s, and with an assumed [name] atrial pressure of 3 mmHg, the estimated [name] ventricular systolic pressure is 42.9 mmHg.  Left Atrium: Left atrial size was mildly dilated.  [name] Atrium: [name] atrial size was normal in size.  Pericardium: There is no evidence of pericardial effusion.  Mitral Valve: The mitral valve is normal in structure. Trivial mitral valve regurgitation. No evidence of mitral valve stenosis.  Tricuspid Valve: The tricuspid valve is normal in structure. Tricuspid valve regurgitation is mild . No evidence of tricuspid stenosis.  Aortic Valve: The aortic valve is normal in structure. There is mild calcification of the aortic valve.  Aortic valve regurgitation is not visualized. Aortic valve sclerosis/calcification is present, without any evidence of aortic stenosis.  Pulmonic Valve: The pulmonic valve was normal in structure. Pulmonic valve regurgitation is mild. No evidence of pulmonic stenosis.  Aorta: The aortic root is normal in size and structure.  Venous: The inferior vena cava is normal in size with greater than 50% respiratory variability, suggesting [name] atrial pressure of 3 mmHg.  IAS/Shunts: No atrial level shunt detected by color flow Doppler.   LEFT VENTRICLE PLAX 2D LVIDd:         4.50 cm   Diastology LVIDs:         3.20 cm   LV e' medial:    7.94 cm/s LV PW:         1.20 cm   LV E/e' medial:  8.8 LV IVS:        1.50 cm   LV e' lateral:   6.20 cm/s LVOT diam:     2.20 cm   LV E/e' lateral: 11.2 LV SV:         84 LV SV Index:   37 LVOT Area:     3.80 cm   [name] VENTRICLE RV Basal diam:  3.60 cm RV S prime:     15.10 cm/s TAPSE (M-mode): 2.3 cm RVSP:           42.9 mmHg  LEFT ATRIUM             Index        [name] ATRIUM           Index LA diam:  3.90 cm 1.73 cm/m   RA Pressure: 3.00 mmHg LA Vol (A2C):   58.0 ml 25.69 ml/m  RA Area:     14.90 cm LA Vol (A4C):   59.6 ml 26.40 ml/m  RA Volume:   32.30 ml  14.31 ml/m LA Biplane Vol: 63.8 ml 28.26 ml/m AORTIC VALVE LVOT Vmax:   85.40 cm/s LVOT Vmean:  69.200 cm/s LVOT VTI:    0.221 m  AORTA Ao Root diam: 3.50 cm Ao Asc diam:  3.20 cm  MV E velocity: 69.70 cm/s   TRICUSPID VALVE MV A velocity: 101.00 cm/s  TR Peak grad:   39.9 mmHg MV E/A ratio:  0.69         TR Vmax:        316.00 cm/s Estimated RAP:  3.00 mmHg RVSP:           42.9 mmHg  SHUNTS Systemic VTI:  0.22 m Systemic Diam: 2.20 cm  Toribio Fuel MD Electronically signed by Toribio Fuel MD Signature Date/Time: 09/01/2023/10:11:41 PM    Final    MONITORS  LONG TERM MONITOR (3-14 DAYS) 05/20/2023  Narrative   Patient had a minimum heart rate of 52  bpm, maximum heart rate of 135 bpm, and average heart rate of 68 bpm. Predominant underlying rhythm was sinus rhythm. Rare, short runs of paroxysmal SVT. No Atrial fibrillation. Isolated PACs were occasional (3.0%). Isolated PVCs were occasional (1.1%). Triggered and diary events associated with PACs and PVCs.  Rarely symptomatic PACs and PVCs.   CT SCANS  CT CORONARY MORPH W/CTA COR W/SCORE 08/07/2022  Addendum 08/12/2022  8:28 PM ADDENDUM REPORT: 08/12/2022 20:26  EXAM: OVER-READ INTERPRETATION  CT CHEST  The following report is an over-read performed by radiologist Dr. Suzen Dials of [facility] on 08/12/2022. This over-read does not include interpretation of cardiac or coronary anatomy or pathology. The coronary calcium  score/coronary CTA interpretation by the cardiologist is attached.  COMPARISON:  June 02, 2022  FINDINGS: Cardiovascular: There are no significant extracardiac vascular findings.  Mediastinum/Nodes: There are no enlarged lymph nodes within the visualized mediastinum.  Lungs/Pleura: There is no pleural effusion. The visualized lungs appear clear.  Upper abdomen: There is a small hiatal hernia.  Musculoskeletal/Chest wall: No chest wall mass or suspicious osseous findings within the visualized chest.  IMPRESSION: No significant extracardiac findings within the visualized chest.   Electronically Signed By: Suzen Dials M.D. On: 08/12/2022 20:26  Narrative CLINICAL DATA:  This is a 84 year old male with anginal symptoms.  EXAM: Cardiac/Coronary  CTA  TECHNIQUE: The patient was scanned on a [employer].  FINDINGS: A 100 kV prospective scan was triggered in the descending thoracic aorta at 111 HU's. Axial non-contrast 3 mm slices were carried out through the heart. The data set was analyzed on a dedicated work station and scored using the Agatson method. Gantry rotation speed was 250 msecs and collimation was  .6 mm. No beta blockade and 0.8 mg of sl NTG was given. The 3D data set was reconstructed in 5% intervals of the 67-82 % of the R-R cycle. Diastolic phases were analyzed on a dedicated work station using MPR, MIP and VRT modes. The patient received 80 cc of contrast.  Image Quality: Fair with misregistration artifact.  Aorta: Normal size.  No calcifications.  No dissection.  Aortic Valve:  Trileaflet.  No calcifications.  Coronary Arteries:  Normal coronary origin.  [name] dominance.  RCA is a large dominant artery that gives rise to  PDA and PLA. The proximal and mid RCA with diffuse mild (25-49%) calcified plaques. The distal RCA with minimal (<24%) in the distal RCA.  Left main is a large artery that gives rise to LAD and LCX arteries.  LAD is a large vessel. Diffuse minimal calcified plaques in the proximal and mid LAD. Distal LAD with no plaques. D1 vessel with minimal calcification in the proximal portion of the vessel.  LCX is a non-dominant artery that gives rise to one large OM1 branch. Diffuse minimal calcified plaques in the proximal LCX.  Coronary Calcium  Score:  Left main: 0  Left anterior descending artery: 57.8  Left circumflex artery: 99.3  [name] coronary artery: 725  Total: 882  Percentile: 60  Other findings:  Normal pulmonary vein drainage into the left atrium.  Normal left atrial appendage without a thrombus.  Normal size of the pulmonary artery.  IMPRESSION: 1. Coronary calcium  score of 882. This was 60 percentile for age and sex matched control.  2. Normal coronary origin with [name] dominance.  3. CAD-RADS 1. Minimal non-obstructive CAD (0-24%). Consider non-atherosclerotic causes of chest pain. Consider preventive therapy and risk factor modification.  The noncardiac portion of this study will be interpreted in separate report by the radiologist.  Electronically Signed: By: Kardie  Tobb D.O. On: 08/07/2022 13:18      ______________________________________________________________________________________________      Recent Labs: 08/05/2023: NT-Pro BNP 328 08/12/2023: BUN 14; Creatinine, Ser 1.06; Potassium 4.1; Sodium 145  Recent Lipid Panel    Component Value Date/Time   CHOL 167 12/15/2022 1148   TRIG 125 12/15/2022 1148   HDL 43 12/15/2022 1148   CHOLHDL 3.9 12/15/2022 1148   CHOLHDL 3.5 02/28/2017 0218   VLDL 10 02/28/2017 0218   LDLCALC 102 (H) 12/15/2022 1148    Physical Exam:    VS:  BP 128/66   Pulse 63   Ht 5' 6 (1.676 m)   Wt 264 lb 12.8 oz (120.1 kg)   SpO2 90%   BMI 42.74 kg/m     Wt Readings from Last 3 Encounters:  03/11/24 264 lb 12.8 oz (120.1 kg)  09/08/23 270 lb 6.4 oz (122.7 kg)  08/05/23 259 lb 3.2 oz (117.6 kg)    GEN: Morbid obesity. No acute distress HEENT: No JVD CARDIAC: No murmur. RRR +2 pulses RESPIRATORY:  Clear to auscultation without rales, wheezing or rhonchi  ABDOMEN: Soft, non-tender, non-distended MUSCULOSKELETAL: No deformity; trace bilateral edema NEUROLOGIC:  Alert and oriented x 3 PSYCHIATRIC:  Normal affect   ASSESSMENT/PLAN:    Mixed Picture Shortness of Breath - hx of radiation therapy - see Rad/Onc PA and his Pulmonologist in [place] after non-contrast CT  Heart failure with preserved ejection fraction Volume issues previously managed with Farxiga , discontinued due to thrush. Fluid retention improved with increased Lasix  dosage. Current examination shows trace fluid in legs, no significant weight gain or orthopnea. - Continue current Lasix  dosage. - Monitor for significant weight gain or fluid retention. - Will consider increasing Lasix  if significant fluid retention occurs.  Paroxysmal atrial fibrillation Currently in sinus rhythm. Previously on flecainide  No recent episodes of AFib reported. - Continue current management flecainide  and AC  Nonobstructive coronary artery disease Minimal blockage.  No recent chest pain or  symptoms reported. - Continue current management with class Ic agent (See SDM from 2023 and 2024)  Obstructive sleep apnea Apnea-hypopnea index of 37 per hour during REM sleep. Discussed potential use of GLP-1 receptor agonists for weight management, but he is not  interested in pursuing this option at this time. - Continue to monitor symptoms and consider GLP-1 therapy if interested in the future.  Aortic root dilation Mildly dilated aorta noted on previous imaging. CT scan scheduled to assess current status. Advised not to be concerned if CT shows dilation under 45 mm, as previous imaging with contrast showed normal limits. - Proceed with scheduled CT scan. - Monitor for significant changes in aortic size (would get CT gated with contrast if significant difference)  Longitudinal care: The evaluation and management services provided today reflect the complexity inherent in caring for this patient, including the ongoing longitudinal relationship and management of multiple chronic conditions and/or the need for care coordination. The visit required a comprehensive assessment and management plan tailored to the patient's unique needs Time was spent addressing not only the acute concerns but also the broader context of the patient's health, including preventive care, chronic disease management, and care coordination as appropriate.  Complex longitudinal is necessary for conditions including: AF and CAD management and the risks and benefits of 1C agent; previously off prior to repeat ischemic testing   Me in 6 months  [name] Leavens, MD FASE [facility] Cardiologist [facility]  [address] [place], [place] [address] [phone]  3:48 PM  "

## 2024-03-11 NOTE — Patient Instructions (Addendum)
 Medication Instructions:  Your physician recommends that you continue on your current medications as directed. Please refer to the Current Medication list given to you today.  *If you need a refill on your cardiac medications before your next appointment, please call your pharmacy*  Lab Work: NONE  If you have labs (blood work) drawn today and your tests are completely normal, you will receive your results only by: MyChart Message (if you have MyChart) OR A paper copy in the mail If you have any lab test that is abnormal or we need to change your treatment, we will call you to review the results.  Testing/Procedures: NONE  Follow-Up: At [facility], you and your health needs are our priority.  As part of our continuing mission to provide you with exceptional heart care, our providers are all part of one team.  This team includes your primary Cardiologist (physician) and Advanced Practice Providers or APPs (Physician Assistants and Nurse Practitioners) who all work together to provide you with the care you need, when you need it.  Your next appointment:   6 month(s)  Provider:   Stanly DELENA Leavens, MD
